# Patient Record
Sex: Male | Born: 1940 | Race: White | Hispanic: No | Marital: Married | Smoking: Former smoker
Health system: Southern US, Community
[De-identification: ages and names within clinical notes are randomized; demographics above are authoritative.]

## PROBLEM LIST (undated history)

## (undated) DIAGNOSIS — C3490 Malignant neoplasm of unspecified part of unspecified bronchus or lung: Secondary | ICD-10-CM

## (undated) DIAGNOSIS — Z9889 Other specified postprocedural states: Secondary | ICD-10-CM

## (undated) DIAGNOSIS — Z8679 Personal history of other diseases of the circulatory system: Secondary | ICD-10-CM

## (undated) DIAGNOSIS — E785 Hyperlipidemia, unspecified: Secondary | ICD-10-CM

## (undated) DIAGNOSIS — N471 Phimosis: Secondary | ICD-10-CM

## (undated) DIAGNOSIS — N179 Acute kidney failure, unspecified: Secondary | ICD-10-CM

## (undated) DIAGNOSIS — Z923 Personal history of irradiation: Secondary | ICD-10-CM

## (undated) DIAGNOSIS — I739 Peripheral vascular disease, unspecified: Secondary | ICD-10-CM

## (undated) DIAGNOSIS — I10 Essential (primary) hypertension: Secondary | ICD-10-CM

## (undated) DIAGNOSIS — H269 Unspecified cataract: Secondary | ICD-10-CM

## (undated) DIAGNOSIS — R351 Nocturia: Secondary | ICD-10-CM

## (undated) DIAGNOSIS — I2510 Atherosclerotic heart disease of native coronary artery without angina pectoris: Secondary | ICD-10-CM

## (undated) DIAGNOSIS — Z95820 Peripheral vascular angioplasty status with implants and grafts: Secondary | ICD-10-CM

## (undated) DIAGNOSIS — C61 Malignant neoplasm of prostate: Secondary | ICD-10-CM

## (undated) HISTORY — DX: Personal history of irradiation: Z92.3

## (undated) HISTORY — PX: BACK SURGERY: SHX140

## (undated) HISTORY — DX: Malignant neoplasm of unspecified part of unspecified bronchus or lung: C34.90

## (undated) HISTORY — PX: POLYPECTOMY: SHX149

## (undated) HISTORY — PX: COLONOSCOPY: SHX174

---

## 1994-06-27 HISTORY — PX: CORONARY ANGIOPLASTY WITH STENT PLACEMENT: SHX49

## 1998-05-20 ENCOUNTER — Emergency Department (HOSPITAL_COMMUNITY): Admission: EM | Admit: 1998-05-20 | Discharge: 1998-05-20 | Payer: Self-pay | Admitting: Emergency Medicine

## 1999-12-21 ENCOUNTER — Inpatient Hospital Stay (HOSPITAL_COMMUNITY): Admission: EM | Admit: 1999-12-21 | Discharge: 1999-12-22 | Payer: Self-pay | Admitting: Emergency Medicine

## 1999-12-21 ENCOUNTER — Encounter: Payer: Self-pay | Admitting: General Surgery

## 1999-12-21 HISTORY — PX: OTHER SURGICAL HISTORY: SHX169

## 2000-01-24 ENCOUNTER — Encounter: Payer: Self-pay | Admitting: Family Medicine

## 2000-01-24 ENCOUNTER — Encounter: Admission: RE | Admit: 2000-01-24 | Discharge: 2000-01-24 | Payer: Self-pay | Admitting: Family Medicine

## 2000-04-23 DIAGNOSIS — Z8679 Personal history of other diseases of the circulatory system: Secondary | ICD-10-CM

## 2000-04-23 HISTORY — DX: Personal history of other diseases of the circulatory system: Z86.79

## 2000-07-17 ENCOUNTER — Encounter: Payer: Self-pay | Admitting: Vascular Surgery

## 2000-07-17 ENCOUNTER — Encounter: Admission: RE | Admit: 2000-07-17 | Discharge: 2000-07-17 | Payer: Self-pay | Admitting: Vascular Surgery

## 2000-08-27 ENCOUNTER — Encounter: Payer: Self-pay | Admitting: Vascular Surgery

## 2000-08-29 ENCOUNTER — Ambulatory Visit (HOSPITAL_COMMUNITY): Admission: RE | Admit: 2000-08-29 | Discharge: 2000-08-29 | Payer: Self-pay | Admitting: Vascular Surgery

## 2000-09-17 ENCOUNTER — Inpatient Hospital Stay (HOSPITAL_COMMUNITY): Admission: RE | Admit: 2000-09-17 | Discharge: 2000-09-21 | Payer: Self-pay | Admitting: Vascular Surgery

## 2000-09-17 ENCOUNTER — Encounter (INDEPENDENT_AMBULATORY_CARE_PROVIDER_SITE_OTHER): Payer: Self-pay | Admitting: Specialist

## 2000-09-17 ENCOUNTER — Encounter: Payer: Self-pay | Admitting: Vascular Surgery

## 2000-09-17 HISTORY — PX: OTHER SURGICAL HISTORY: SHX169

## 2002-05-20 ENCOUNTER — Encounter: Payer: Self-pay | Admitting: Urology

## 2002-05-25 ENCOUNTER — Inpatient Hospital Stay (HOSPITAL_COMMUNITY): Admission: RE | Admit: 2002-05-25 | Discharge: 2002-05-28 | Payer: Self-pay | Admitting: Urology

## 2002-05-25 DIAGNOSIS — C61 Malignant neoplasm of prostate: Secondary | ICD-10-CM

## 2002-05-25 HISTORY — DX: Malignant neoplasm of prostate: C61

## 2002-05-25 HISTORY — PX: PROSTATECTOMY: SHX69

## 2002-05-26 ENCOUNTER — Encounter (INDEPENDENT_AMBULATORY_CARE_PROVIDER_SITE_OTHER): Payer: Self-pay

## 2003-07-12 ENCOUNTER — Ambulatory Visit: Admission: RE | Admit: 2003-07-12 | Discharge: 2003-08-10 | Payer: Self-pay | Admitting: Radiation Oncology

## 2003-08-12 ENCOUNTER — Encounter: Admission: RE | Admit: 2003-08-12 | Discharge: 2003-08-12 | Payer: Self-pay | Admitting: Family Medicine

## 2003-09-02 ENCOUNTER — Ambulatory Visit: Admission: RE | Admit: 2003-09-02 | Discharge: 2003-09-02 | Payer: Self-pay | Admitting: Radiation Oncology

## 2003-09-06 ENCOUNTER — Ambulatory Visit: Admission: RE | Admit: 2003-09-06 | Discharge: 2003-11-10 | Payer: Self-pay | Admitting: Radiation Oncology

## 2003-11-11 ENCOUNTER — Encounter: Admission: RE | Admit: 2003-11-11 | Discharge: 2003-11-11 | Payer: Self-pay | Admitting: Family Medicine

## 2003-12-09 ENCOUNTER — Ambulatory Visit: Admission: RE | Admit: 2003-12-09 | Discharge: 2003-12-09 | Payer: Self-pay | Admitting: Radiation Oncology

## 2003-12-10 ENCOUNTER — Encounter: Admission: RE | Admit: 2003-12-10 | Discharge: 2003-12-10 | Payer: Self-pay | Admitting: Neurosurgery

## 2003-12-31 ENCOUNTER — Encounter: Admission: RE | Admit: 2003-12-31 | Discharge: 2003-12-31 | Payer: Self-pay | Admitting: Neurosurgery

## 2004-02-23 ENCOUNTER — Ambulatory Visit (HOSPITAL_COMMUNITY): Admission: RE | Admit: 2004-02-23 | Discharge: 2004-02-23 | Payer: Self-pay | Admitting: Family Medicine

## 2005-11-15 ENCOUNTER — Encounter: Admission: RE | Admit: 2005-11-15 | Discharge: 2005-11-15 | Payer: Self-pay | Admitting: Family Medicine

## 2007-02-03 ENCOUNTER — Ambulatory Visit: Payer: Self-pay | Admitting: Internal Medicine

## 2007-02-18 ENCOUNTER — Encounter: Payer: Self-pay | Admitting: Internal Medicine

## 2007-02-18 ENCOUNTER — Ambulatory Visit: Payer: Self-pay | Admitting: Internal Medicine

## 2009-06-21 HISTORY — PX: NM MYOCAR PERF WALL MOTION: HXRAD629

## 2010-05-13 ENCOUNTER — Encounter: Payer: Self-pay | Admitting: Neurosurgery

## 2010-09-08 NOTE — Op Note (Signed)
[place]. [facility]  Patient:    Cody Moss, Cody Moss                      MRN: [id number] Proc. Date: 09/17/00 Attending:  Di Kindle. Edilia Bo, M.D. CC:         Dr. Renato Battles, M.D.   Operative Report  PREOPERATIVE DIAGNOSIS:  A 5.1 cm infrarenal abdominal aortic aneurysm.  POSTOPERATIVE DIAGNOSIS:  A 5.1 cm infrarenal abdominal aortic aneurysm.  PROCEDURE:  Repair of abdominal aortic aneurysm with aortobi-iliac bypass graft (20 x 10 mm graft).  SURGEON:  Di Kindle. Edilia Bo, M.D.  ASSISTANT:  Adair Patter, P.A.  ANESTHESIA:  General.  INDICATIONS:  This is a 70 year old gentleman who I had originally seen with a 4.6 cm aneurysm in September 2001.  He had a follow-up CT scan in six months, and this shows that the aneurysm had enlarged to 5.1 cm.  After preoperative cardiac evaluation, he was brought in for elective repair.  The procedure and potential complications were discussed with the patient in detail preoperatively.  DESCRIPTION OF PROCEDURE:  The patient was brought to the operating room and received a general anesthetic.  Swan-Ganz catheter and arterial line had been placed by anesthesia.  The abdomen and groins were prepped and draped in the usual sterile fashion.  The abdomen was then entered through a midline incision.  Upon careful exploration, no other anterior abdominal pathology was found besides the aneurysm.  There was just some mild diverticular disease. The transverse colon was then reflected superiorly and the small bowel reflected to the right.  The retroperitoneal tissue was divided and the aneurysm dissected free up to the level of the renal vein.  This was dissected free enough that a clamp could be placed proximally below the renal arteries. Next, the dissection was carried down to the right common iliac artery, where the vessel did have some plaque but was able to be easily clamped.  Likewise, the  left common iliac artery was dissected out, trying to preserve the sympathetics as they crossed the left common iliac artery.  Again a clamp could be applied here also.  Next, the patient was heparinized with 9000 units of IV heparin, and the patient also received 25 g of mannitol.  A clamp was then placed on the infrarenal aorta and then both common iliac arteries were clamped.  The aneurysm was entered, and a large amount of laminated thrombus was removed.  There was some significant bleeding from a middle sacral artery, and there was a significant plaque here, which I had to endarterectomize in order to oversew this middle sacral artery.  Because of this, I did not think I would be able to sew a tube graft, and therefore I decided to go to both common iliac arteries.  Proximally the graft was cut to the appropriate length and using a felt cuff, it was sewn end-to-side to the infrarenal aorta after the proximal aorta was teed off and divided circumferentially.  The anastomosis was tested for hemostasis and was hemostatic.  Each limb of the graft was pulled down for anastomosis to the common iliac arteries.  The right common iliac artery was slightly spatulated.  A graft was cut to the appropriate length and sewn end-to-end to the common iliac artery using continuous 4-0 Prolene suture.  The vessels were backbled and flushed appropriately and then flow re-established to the right leg, which the patient tolerated from a hemodynamic standpoint.  Next, the left common iliac artery, which had been clamped, was opened further and divided circumferentially.  The left limb of the graft was cut to the appropriate length and sewn end-to-end to the left common iliac artery using continuous 4-0 Prolene suture.  At the completion, again the flow was re-established to the left leg, which the patient tolerated from a hemodynamic standpoint.  The wound was irrigated with copious amounts of antibiotic  solution.  The hemostasis was obtained.  The aneurysm was then closed over the graft using a running 2-0 Vicryl suture. The retroperitoneal tissue was closed with a running 2-0 Vicryl suture.  The retroperitoneal tissue was closed with a running 2-0 Vicryl suture.  The abdominal contents were returned to their normal position and then the fascia closed with two running #1 PDS sutures.  The skin was closed with staples.  A sterile dressing was applied.  The patient tolerated the procedure well, was transferred to the recovery room in satisfactory condition.  All needle and sponge counts were correct. DD:  09/17/00 TD:  09/17/00 Job: [id number]
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
## 2010-09-08 NOTE — Procedures (Signed)
[facility]  Patient:    Cody Moss, Cody Moss                    MRN: [id number] Proc. Date: 08/29/00 Adm. Date:  [id number] Attending:  Bennye Alm CC:         [name], M.D.  [facility], [facility]   Procedure Report  PROCEDURES: 1. Aortogram. 2. Bilateral iliac arteriogram. 3. Bilateral lower extremity runoff.  INDICATIONS FOR PROCEDURE:  This is a 70 year old gentleman who I have been following with an infrarenal abdominal aortic aneurysm. This enlarged to greater than 5 cm and therefore elective repair was recommended. His CAT scan showed that the proximal neck of the aneurysm was approximately 28 to 30 mm and also had some laminated thrombus at the neck and therefore he was probably not a candidate for endovascular repair. He was brought in for arteriography in order to plan elective surgical repair and to be sure that in fact he is not a candidate for endovascular repair. The procedure and potential complications were discussed with the patient in the office prior to the procedure. All questions were answered and he elected to proceed.  TECHNIQUE:  The patient was brought to the PV lab at [facility] and studied with a milligram of Versed and 50 mcg of fentanyl. Both groins were prepped and draped in the usual sterile fashion. After the skin was infiltrated with 1% lidocaine, the right common femoral artery was cannulated and a guidewire introduced into the infrarenal aorta. A 5 French sheath was then passed over the wire. Pigtail catheter positioned over the wire and positioned at the L1 vertebral body. The flush aortogram was then obtained and then a lateral projection was obtained. The catheter was repositioned above the aortic bifurcation and oblique iliac projections were obtained and then bilateral lower extremity runoff film obtained using a bolus and chase technique.  FINDINGS:  There were single renal arteries bilaterally  with no significant renal artery stenosis identified. The size of the aneurysm could not be ascertained by the study as there was a significant amount of laminated thrombus which was noted on his CAT scan. There is mild dilatation of the distal left common iliac artery. The right common iliac artery is widely patent. Both hypogastric arteries are patent and both external iliac arteries are patent. On the lateral projection, the celiac axis and the superior mesenteric arteries are widely patent. There appears to be an eccentric plaque at the distal left common iliac artery. The common femoral, superficial femora and deep femoral arteries are patent bilaterally. There is some mild narrowing of the distal superficial femoral artery on the left. The popliteal arteries are widely patent and there is three-vessel runoff bilaterally via the anterior tibial, posterior tibial and peroneal arteries.  CONCLUSIONS: 1. Infrarenal abdominal aortic aneurysm the size of which cannot be accurately    determined by the study because of laminated thrombus. 2. Mild dilatation of the left distal common iliac artery. No significant    infrainguinal arterial occlusive disease. DD:  08/29/00 TD:  08/29/00 Job: [id number]

## 2010-09-08 NOTE — Discharge Summary (Signed)
NAME:  Cody Moss, Cody Moss NO.:  [id number]   MEDICAL RECORD NO.:  [id number]                   PATIENT TYPE:  INP   LOCATION:  7829                                 FACILITY:  [facility]   PHYSICIAN:  Crecencio Mc, M.D.                    DATE OF BIRTH:  01-18-1941   DATE OF ADMISSION:  05/25/2002  DATE OF DISCHARGE:  05/28/2002                                 DISCHARGE SUMMARY   ADMISSION DIAGNOSIS:  Prostate cancer.   DISCHARGE DIAGNOSIS:  Prostate cancer.   PROCEDURES:  1. Radical retropubic prostatectomy.  2. Bilateral pelvic lymphadenectomy.   HISTORY OF PRESENT ILLNESS:  For full details, please see admission history  and physical.  Briefly, the patient is a 70 year old white male who was  recently found on his left side a left-sided prostate nodule.  He was also  found to have an elevated PSA of 7.9.  He subsequently underwent a prostate  biopsy and was found to have Gleason 4+4=8 adenocarcinoma of the prostate.  After discussing treatment options, the patient elected to proceed with  radical retropubic prostatectomy.   HOSPITAL COURSE:  The patient was taken to the operating room on 05/25/02, and  underwent the above procedures.  He tolerated these well, and  postoperatively was able to be transferred to a regular hospital room  following recovery from anesthesia.  Over the course of the next few days,  the patient was gradually able to resume ambulation, and his diet was  gradually advanced until he was able to tolerate a regular diet.  On the  evening of postoperative day #1, the patient did have a fever to 101.7  degrees Fahrenheit.  He was encouraged to be ambulatory and to use incentive  spirometry.  On postoperative day #2, his Harrison Mons drain had decreased output  and was able to be removed.  By postoperative day #3, the patient had  defervesced and was able to be discharged home with a Foley catheter.   DISPOSITION:  Home.   DISCHARGE  MEDICATIONS:  1. The patient was instructed to resume all regular medications.  2. In addition, he was given a prescription for hydrocodone, as well as     Bactrim.   DIET:  The patient was instructed to resume his diet as before surgery.   ACTIVITY:  He is instructed to be ambulatory, however, he was specifically  instructed to refrain from any heavy lifting, strenuous activity, or  driving.   WOUND CARE:  He was instructed on Foley catheter care and given a leg bag  for daytime use.    FOLLOWUP:  A follow up appointment was made for the patient to follow up  with Dr. Retta Diones in approximately one week for staple removal.  Crecencio Mc, M.D.    LB/MEDQ  D:  07/01/2002  T:  07/02/2002  Job:  [id number]
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
## 2010-09-08 NOTE — Op Note (Signed)
NAME:  Cody Moss, Cody Moss NO.:  [id number]   MEDICAL RECORD NO.:  [id number]                   PATIENT TYPE:  INP   LOCATION:  X005                                 FACILITY:  [facility]   PHYSICIAN:  Bertram Millard. Dahlstedt, M.D.          DATE OF BIRTH:  1941-01-11   DATE OF PROCEDURE:  05/25/2002  DATE OF DISCHARGE:                                 OPERATIVE REPORT   PREOPERATIVE DIAGNOSIS:  Adenocarcinoma of the prostate.   POSTOPERATIVE DIAGNOSIS:  Adenocarcinoma of the prostate.   PROCEDURES:  1. Bilateral pelvic lymphadenectomy.  2. Radical retropubic prostatectomy.   SURGEON:  Bertram Millard. Retta Diones, M.D.   ASSISTANT:  Crecencio Mc, M.D.   ANESTHESIA:  General.   ESTIMATED BLOOD LOSS:  1200 mL.   FLUIDS REPLACED:  4200 mL crystalloid.   SPECIMENS:  1. Bilateral obturator lymph nodes.  2. Prostate and seminal vesicles.   COMPLICATIONS:  None.   DRAINS:  1. Foley catheter.  2. #10 Blake drain.   INDICATIONS:  The patient is a 70 year old white male who was recently found  to have a left-sided prostatic nodule along with an elevated PSA of 7.9.  He  subsequently underwent a prostate biopsy and was found to have Gleason 4 x 4  = 8 adenocarcinoma of the prostate.  After discussing treatment options, the  patient elected to proceed with surgical therapy.  Potential risks and  benefits of radical prostatectomy were discussed with the patient, and he  consented.   DESCRIPTION OF PROCEDURE:  The patient was taken to the operating room and a  general anesthetic was administered.  The patient was placed in the supine  position, prepped and draped in the usual sterile fashion, and administered  preoperative antibiotics.  The table was slightly flexed at the iliac  crests.  A 20 French Foley catheter was then inserted into the bladder and  the bladder was drained.  An infraumbilical incision was then made with a  #10 blade from a site a few centimeters  below the umbilicus down through the  pubic symphysis.  This was then carried down through the subcutaneous  tissues with Bovie electrocautery.  The rectus fascia was then incised in  the midline and the rectus muscles were divided.  The transversalis fascia  was then sharply entered, thereby exposing the space of Retzius.  This space  was then bluntly developed on either side of the bladder to expose the  obturator lymph node packages.  A self-retaining Bookwalter retractor was  then placed and attention turned to lymphadenectomy.  The left side was  first examined, and there were no abnormal palpable lymph nodes on gross  examination.  An obturator lymph node dissection was then performed with  blunt and sharp dissection used as necessary, with care not to injure the  obturator nerve.  The vessels and lymphatics were hemoclipped.  The  boundaries of the lymphadenectomy included the iliac vein  superiorly, the  bifurcation of the iliac vessels, the external iliac vein anteriorly, the  bifurcation of the iliac vessels superiorly, the obturator nerve  posteriorly.  Attention then turned to the right side and an obturator lymph  node dissection was performed in a similar fashion.  These specimens were  then sent for permanent pathologic examination.  Attention then turned to  the prostatectomy.  The endopelvic fascia was incised on either side of the  prostate and the levator fibers were bluntly freed from the prostate  laterally.  The puboprostatic ligaments were then identified and sharply  dissected from the pubis.  Attention then turned to the dorsal venous  complex.  A Hohenfellner was placed between the dorsal venous complex and  the anterior urethra.  A #1 Vicryl tie was then used to tie off the dorsal  vein.  A second #1 Vicryl was then also placed around the dorsal vein.  A 2-  0 Vicryl was then also placed through this space and then taken through the  middle of the dorsal venous  complex with a suture ligature.  A 2-0 Vicryl  back bleeding stitch was also placed.  The dorsal venous complex was then  taken down with electrocautery down until the anterior urethra was exposed.  The neurovascular bundle on the right side was identified and reflected  laterally and posteriorly away from the urethra with the aid of a right  angle.  The right angle was then passed under the urethra and a piece of  umbilical tape was drawn through this space.  The anterior urethra was then  divided with Metzenbaum scissors and the Foley catheter was brought into the  field.  The posterior urethra was then also sharply dissected free.  The  space between the anterior rectal fascia and Denonvillier's fascia was then  bluntly developed.  The prostatic pedicles were then dissected out with the  aid of a right angle, and these were hemoclipped and divided.  A nerve-  sparing approach was performed on the right side while a wide excision was  performed on the left side of the prostate.  Attention then turned to the  area of the bladder neck.  A Vanderbilt was used to dissect the muscular  fibers overlying the bladder neck.  These were divided with electrocautery.  Once the bladder neck was easily identified, it was entered anteriorly.  The  posterior bladder neck was then divided with electrocautery and the space  between the prostate and the posterior bladder was developed.  The remaining  pedicle tissue was then hemoclipped and divided.  This left only the seminal  vesicles and the ampulla of the vas deferens.  These structures were  dissected out with the aid of a right angle, hemoclipped, and sharply  divided.  The specimen was then removed from the wound and passed off for  permanent pathologic examination.  Attention was then turned to obtaining  hemostasis.  There did appear to be some oozing from the right side of the bladder neck area.  This area was cauterized until good hemostasis was   achieved.  The dorsal venous complex was examined and appeared to have good  hemostasis.  Attention then turned to reconstruction of the bladder neck.  The posterior bladder neck was reconstructed in a tennis racquet fashion  with a 2-0 chromic suture.  The remainder of the bladder neck was then  everted with interrupted 4-0 chromic sutures, leaving the bladder neck of a  size to  allow passage of the catheter.  A Greenwald sound was then placed  into the urethra to help identify the urethral stump.  Five separate 2-0 PDS  anastomotic sutures were then placed in the bladder neck at the 5 o'clock, 7  o'clock, 9 o'clock, 3 o'clock, and 12 o'clock positions.  At this point  there was noted to be some new venous bleeding from the dorsal venous  complex.  This was able to be grasped with the right angle and a 2-0 Vicryl  tie was used to control this bleeding.  The anastomotic sutures were then  brought through the bladder neck in their corresponding positions and the  first suture on the posterior left side was tied down.  However, this suture  did pull through.  Therefore, a free needle was placed on one edge of this  suture, and it was able to be brought back through the urethra in the  correct position.  It was then tied down and appeared to allow good  approximation of the bladder neck and urethra.  The other sutures were then  tied down without difficulty.  The bladder was then irrigated and there  appeared to be no significant anastomotic leak or blood clots in the  bladder.  A #10 Blake drain was then placed in the pelvis and brought out  through a separate stab incision in the right-sided abdominal wall.  The  fascia was then fastened to the abdominal skin with a nylon stitch.  The  fascia was then closed with a running #1 PDS suture.  The skin was then  reapproximated with a running 4-0 Vicryl subcuticular stitch.  Benzoin and  Steri-Strips were placed, and  a sterile dressing was  applied.  The patient  appeared to tolerate the  procedure well and without complications.  He was able to be extubated  nondistended transferred to the recovery unit in satisfactory condition.  Please note that Dr. Marcine Matar was the operating surgeon and was  present and participated in this entire procedure.      Crecencio Mc, M.D.                          Bertram Millard. Retta Diones, M.D.    Arlana Hove  D:  05/25/2002  T:  05/25/2002  Job:  [id number]
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
## 2011-05-03 ENCOUNTER — Other Ambulatory Visit: Payer: Self-pay | Admitting: Urology

## 2011-05-03 DIAGNOSIS — C61 Malignant neoplasm of prostate: Secondary | ICD-10-CM

## 2011-05-14 ENCOUNTER — Encounter (HOSPITAL_COMMUNITY)
Admission: RE | Admit: 2011-05-14 | Discharge: 2011-05-14 | Disposition: A | Discharge status: Home / Self Care | Payer: Medicare Other | Source: Ambulatory Visit | Attending: Urology | Admitting: Urology

## 2011-05-14 ENCOUNTER — Encounter (HOSPITAL_COMMUNITY): Payer: Self-pay

## 2011-05-14 ENCOUNTER — Other Ambulatory Visit: Payer: Self-pay | Admitting: Urology

## 2011-05-14 DIAGNOSIS — C61 Malignant neoplasm of prostate: Secondary | ICD-10-CM

## 2011-05-14 DIAGNOSIS — R972 Elevated prostate specific antigen [PSA]: Secondary | ICD-10-CM | POA: Insufficient documentation

## 2011-05-14 MED ORDER — TECHNETIUM TC 99M MEDRONATE IV KIT
25.0000 | PACK | Freq: Once | INTRAVENOUS | Status: AC | PRN
  Administered 2011-05-14: 25 via INTRAVENOUS

## 2011-06-04 ENCOUNTER — Encounter (HOSPITAL_BASED_OUTPATIENT_CLINIC_OR_DEPARTMENT_OTHER): Payer: Self-pay | Admitting: *Deleted

## 2011-06-04 NOTE — Progress Notes (Signed)
NPO AFTER MN. ARRIVES AT 1015. NEEDS ISTAT. EKG, NOTE, STRESS TEST TO BE FAXED FROM DR AL LITTLE. WILL TAKE TOPROL AM OF SURG W/ SIP OF WATER.

## 2011-06-08 ENCOUNTER — Encounter (HOSPITAL_BASED_OUTPATIENT_CLINIC_OR_DEPARTMENT_OTHER)
Admission: RE | Disposition: A | Discharge status: Home / Self Care | Payer: Self-pay | Source: Ambulatory Visit | Attending: Urology

## 2011-06-08 ENCOUNTER — Encounter (HOSPITAL_BASED_OUTPATIENT_CLINIC_OR_DEPARTMENT_OTHER): Payer: Self-pay | Admitting: Anesthesiology

## 2011-06-08 ENCOUNTER — Ambulatory Visit (HOSPITAL_BASED_OUTPATIENT_CLINIC_OR_DEPARTMENT_OTHER): Payer: Medicare Other | Admitting: Anesthesiology

## 2011-06-08 ENCOUNTER — Other Ambulatory Visit: Payer: Self-pay | Admitting: Urology

## 2011-06-08 ENCOUNTER — Encounter (HOSPITAL_BASED_OUTPATIENT_CLINIC_OR_DEPARTMENT_OTHER): Payer: Self-pay | Admitting: *Deleted

## 2011-06-08 ENCOUNTER — Ambulatory Visit (HOSPITAL_BASED_OUTPATIENT_CLINIC_OR_DEPARTMENT_OTHER)
Admission: RE | Admit: 2011-06-08 | Discharge: 2011-06-08 | Disposition: A | Discharge status: Home / Self Care | Payer: Medicare Other | Source: Ambulatory Visit | Attending: Urology | Admitting: Urology

## 2011-06-08 DIAGNOSIS — Z87898 Personal history of other specified conditions: Secondary | ICD-10-CM

## 2011-06-08 DIAGNOSIS — N529 Male erectile dysfunction, unspecified: Secondary | ICD-10-CM | POA: Insufficient documentation

## 2011-06-08 DIAGNOSIS — I739 Peripheral vascular disease, unspecified: Secondary | ICD-10-CM | POA: Insufficient documentation

## 2011-06-08 DIAGNOSIS — E785 Hyperlipidemia, unspecified: Secondary | ICD-10-CM | POA: Insufficient documentation

## 2011-06-08 DIAGNOSIS — E119 Type 2 diabetes mellitus without complications: Secondary | ICD-10-CM | POA: Insufficient documentation

## 2011-06-08 DIAGNOSIS — N478 Other disorders of prepuce: Secondary | ICD-10-CM | POA: Insufficient documentation

## 2011-06-08 DIAGNOSIS — C61 Malignant neoplasm of prostate: Secondary | ICD-10-CM | POA: Insufficient documentation

## 2011-06-08 DIAGNOSIS — Z9861 Coronary angioplasty status: Secondary | ICD-10-CM | POA: Insufficient documentation

## 2011-06-08 DIAGNOSIS — I2510 Atherosclerotic heart disease of native coronary artery without angina pectoris: Secondary | ICD-10-CM | POA: Insufficient documentation

## 2011-06-08 DIAGNOSIS — I10 Essential (primary) hypertension: Secondary | ICD-10-CM | POA: Insufficient documentation

## 2011-06-08 DIAGNOSIS — N471 Phimosis: Secondary | ICD-10-CM | POA: Insufficient documentation

## 2011-06-08 DIAGNOSIS — Z9079 Acquired absence of other genital organ(s): Secondary | ICD-10-CM | POA: Insufficient documentation

## 2011-06-08 HISTORY — DX: Unspecified cataract: H26.9

## 2011-06-08 HISTORY — DX: Phimosis: N47.1

## 2011-06-08 HISTORY — DX: Nocturia: R35.1

## 2011-06-08 HISTORY — DX: Hyperlipidemia, unspecified: E78.5

## 2011-06-08 HISTORY — DX: Personal history of other diseases of the circulatory system: Z86.79

## 2011-06-08 HISTORY — DX: Essential (primary) hypertension: I10

## 2011-06-08 HISTORY — DX: Malignant neoplasm of prostate: C61

## 2011-06-08 HISTORY — DX: Other specified postprocedural states: Z98.890

## 2011-06-08 HISTORY — DX: Peripheral vascular angioplasty status with implants and grafts: Z95.820

## 2011-06-08 HISTORY — DX: Atherosclerotic heart disease of native coronary artery without angina pectoris: I25.10

## 2011-06-08 HISTORY — DX: Peripheral vascular disease, unspecified: I73.9

## 2011-06-08 HISTORY — PX: CIRCUMCISION: SHX1350

## 2011-06-08 SURGERY — CIRCUMCISION, ADULT
Anesthesia: General | Site: Penis | Wound class: Clean Contaminated

## 2011-06-08 MED ORDER — FENTANYL CITRATE 0.05 MG/ML IJ SOLN
INTRAMUSCULAR | Status: DC | PRN
  Administered 2011-06-08: 50 ug via INTRAVENOUS
  Administered 2011-06-08 (×2): 25 ug via INTRAVENOUS

## 2011-06-08 MED ORDER — LACTATED RINGERS IV SOLN
INTRAVENOUS | Status: DC
  Administered 2011-06-08 (×2): via INTRAVENOUS

## 2011-06-08 MED ORDER — MEPERIDINE HCL 25 MG/ML IJ SOLN
6.2500 mg | INTRAMUSCULAR | Status: DC | PRN
Start: 2011-06-08 — End: 2011-06-08

## 2011-06-08 MED ORDER — ONDANSETRON HCL 4 MG/2ML IJ SOLN
INTRAMUSCULAR | Status: DC | PRN
  Administered 2011-06-08: 4 mg via INTRAVENOUS

## 2011-06-08 MED ORDER — FENTANYL CITRATE 0.05 MG/ML IJ SOLN: 25.0000 ug | INTRAMUSCULAR | Status: DC | PRN

## 2011-06-08 MED ORDER — LACTATED RINGERS IV SOLN
INTRAVENOUS | Status: DC | PRN
  Administered 2011-06-08: 11:00:00 via INTRAVENOUS

## 2011-06-08 MED ORDER — PROMETHAZINE HCL 25 MG/ML IJ SOLN: 6.2500 mg | INTRAMUSCULAR | Status: DC | PRN

## 2011-06-08 MED ORDER — MIDAZOLAM HCL 5 MG/5ML IJ SOLN
INTRAMUSCULAR | Status: DC | PRN
  Administered 2011-06-08: 2 mg via INTRAVENOUS
  Administered 2011-06-08 (×2): 1 mg via INTRAVENOUS

## 2011-06-08 MED ORDER — LACTATED RINGERS IV SOLN: INTRAVENOUS | Status: DC

## 2011-06-08 MED ORDER — CIPROFLOXACIN IN D5W 400 MG/200ML IV SOLN
400.0000 mg | INTRAVENOUS | Status: AC
  Administered 2011-06-08: 400 mg via INTRAVENOUS

## 2011-06-08 MED ORDER — BUPIVACAINE HCL 0.25 % IJ SOLN
INTRAMUSCULAR | Status: DC | PRN
  Administered 2011-06-08: 24 mL

## 2011-06-08 MED ORDER — PROPOFOL 10 MG/ML IV EMUL
INTRAVENOUS | Status: DC | PRN
  Administered 2011-06-08: 50 ug/kg/min via INTRAVENOUS

## 2011-06-08 MED ORDER — HYDROCODONE-ACETAMINOPHEN 5-500 MG PO CAPS: 1.0000 | ORAL_CAPSULE | ORAL | Status: AC | PRN

## 2011-06-08 SURGICAL SUPPLY — 33 items
BANDAGE CO FLEX L/F 1IN X 5YD (GAUZE/BANDAGES/DRESSINGS) ×2 IMPLANT
BANDAGE CONFORM 2  STR LF (GAUZE/BANDAGES/DRESSINGS) ×2 IMPLANT
BLADE SURG 15 STRL LF DISP TIS (BLADE) ×1 IMPLANT
BLADE SURG 15 STRL SS (BLADE) ×2
BNDG COHESIVE 1X5 TAN STRL LF (GAUZE/BANDAGES/DRESSINGS) ×1 IMPLANT
CLOTH BEACON ORANGE TIMEOUT ST (SAFETY) ×2 IMPLANT
COVER MAYO STAND STRL (DRAPES) ×2 IMPLANT
COVER TABLE BACK 60X90 (DRAPES) ×2 IMPLANT
DRAPE PED LAPAROTOMY (DRAPES) ×2 IMPLANT
ELECT NDL TIP 2.8 STRL (NEEDLE) IMPLANT
ELECT NEEDLE TIP 2.8 STRL (NEEDLE) ×2 IMPLANT
ELECT REM PT RETURN 9FT ADLT (ELECTROSURGICAL) ×2
ELECTRODE REM PT RTRN 9FT ADLT (ELECTROSURGICAL) ×1 IMPLANT
GAUZE SPONGE 4X4 8PLY STR LF (GAUZE/BANDAGES/DRESSINGS) ×1 IMPLANT
GAUZE VASELINE 1X8 (GAUZE/BANDAGES/DRESSINGS) ×1 IMPLANT
GAUZE VASELINE 3X9 (GAUZE/BANDAGES/DRESSINGS) ×1 IMPLANT
GLOVE BIO SURGEON STRL SZ8 (GLOVE) ×3 IMPLANT
GLOVE ECLIPSE 6.5 STRL STRAW (GLOVE) ×1 IMPLANT
GLOVE ECLIPSE 7.5 STRL STRAW (GLOVE) ×1 IMPLANT
GOWN STRL REIN XL XLG (GOWN DISPOSABLE) ×2 IMPLANT
GOWN W/COTTON TOWEL STD LRG (GOWNS) ×3 IMPLANT
GOWN XL W/COTTON TOWEL STD (GOWNS) ×2 IMPLANT
NDL HYPO 25X5/8 SAFETYGLIDE (NEEDLE) ×1 IMPLANT
NEEDLE HYPO 22GX1.5 SAFETY (NEEDLE) ×2 IMPLANT
NEEDLE HYPO 25X5/8 SAFETYGLIDE (NEEDLE) ×2 IMPLANT
NS IRRIG 500ML POUR BTL (IV SOLUTION) ×2 IMPLANT
PACK BASIN DAY SURGERY FS (CUSTOM PROCEDURE TRAY) ×2 IMPLANT
PENCIL BUTTON HOLSTER BLD 10FT (ELECTRODE) ×2 IMPLANT
SUT CHROMIC 4 0 RB 1X27 (SUTURE) ×4 IMPLANT
SUT CHROMIC 5 0 RB 1 27 (SUTURE) IMPLANT
SYR CONTROL 10ML LL (SYRINGE) ×2 IMPLANT
TOWEL OR 17X24 6PK STRL BLUE (TOWEL DISPOSABLE) ×4 IMPLANT
WATER STERILE IRR 500ML POUR (IV SOLUTION) ×2 IMPLANT

## 2011-06-08 NOTE — Anesthesia Preprocedure Evaluation (Addendum)
Anesthesia Evaluation  Patient identified by MRN, date of birth, ID band Patient awake    Reviewed: Allergy & Precautions, H&P , NPO status , Patient's Chart, lab work & pertinent test results, reviewed documented beta blocker date and time   Airway Mallampati: II TM Distance: >3 FB Neck ROM: Full    Dental No notable dental hx.    Pulmonary neg pulmonary ROS,  clear to auscultation  Pulmonary exam normal       Cardiovascular hypertension, Pt. on medications - angina+ CAD (s/p stent '96. neg stress test 2011) and neg cardio ROS Regular Normal    Neuro/Psych Negative Neurological ROS  Negative Psych ROS   GI/Hepatic negative GI ROS, Neg liver ROS,   Endo/Other  Negative Endocrine ROSDiabetes mellitus-, Oral Hypoglycemic Agents  Renal/GU negative Renal ROS  Genitourinary negative   Musculoskeletal negative musculoskeletal ROS (+)   Abdominal   Peds negative pediatric ROS (+)  Hematology negative hematology ROS (+)   Anesthesia Other Findings   Reproductive/Obstetrics negative OB ROS                          Anesthesia Physical Anesthesia Plan  ASA: II  Anesthesia Plan: General   Post-op Pain Management:    Induction: Intravenous  Airway Management Planned:   Additional Equipment:   Intra-op Plan:   Post-operative Plan: Extubation in OR  Informed Consent: I have reviewed the patients History and Physical, chart, labs and discussed the procedure including the risks, benefits and alternatives for the proposed anesthesia with the patient or authorized representative who has indicated his/her understanding and acceptance.   Dental advisory given  Plan Discussed with: CRNA  Anesthesia Plan Comments:         Anesthesia Quick Evaluation

## 2011-06-08 NOTE — Anesthesia Postprocedure Evaluation (Signed)
  Anesthesia Post-op Note  Patient: Cody Moss  Procedure(s) Performed: Procedure(s) (LRB): CIRCUMCISION ADULT (N/A)  Patient Location: PACU  Anesthesia Type: MAC  Level of Consciousness: awake and alert   Airway and Oxygen Therapy: Patient Spontanous Breathing  Post-op Pain: mild  Post-op Assessment: Post-op Vital signs reviewed, Patient's Cardiovascular Status Stable, Respiratory Function Stable, Patent Airway and No signs of Nausea or vomiting  Post-op Vital Signs: stable  Complications: No apparent anesthesia complications

## 2011-06-08 NOTE — Transfer of Care (Signed)
Immediate Anesthesia Transfer of Care Note  Patient: Cody Moss  Procedure(s) Performed: Procedure(s) (LRB): CIRCUMCISION ADULT (N/A)  Patient Location: PACU  Anesthesia Type: General  Level of Consciousness: awake, sedated, patient cooperative and responds to stimulation  Airway & Oxygen Therapy: Patient Spontanous Breathing and Patient connected to face mask oxygen  Post-op Assessment: Report given to PACU RN, Post -op Vital signs reviewed and stable and Patient moving all extremities  Post vital signs: Reviewed and stable  Complications: No apparent anesthesia complications

## 2011-06-08 NOTE — Anesthesia Procedure Notes (Signed)
Procedure Name: MAC Date/Time: 06/08/2011 11:47 AM Performed by: Iline Oven Pre-anesthesia Checklist: Patient identified, Emergency Drugs available, Suction available, Patient being monitored and Timeout performed Patient Re-evaluated:Patient Re-evaluated prior to inductionPreoxygenation: Pre-oxygenation with 100% oxygen
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
## 2011-06-08 NOTE — Discharge Instructions (Addendum)
herPostoperative instructions for circumcision  Wound:  Remove the dressing the morning after surgery. In most cases your incision will have absorbable sutures that run along the course of your incision and will dissolve within the first 10-20 days. Some will fall out even earlier. Expect some redness as the sutures dissolved but this should occur only around the sutures. If there is generalized redness, especially with increasing pain or swelling, let us know. The penis will possibly get "black and blue" as the blood in the tissues spread. Sometimes the whole penis will turn colors. The black and blue is followed by a yellow and brown color. In time, all the discoloration will go away.  Diet:  You may return to your normal diet within 24 hours following your surgery. You may note some mild nausea and possibly vomiting the first 6-8 hours following surgery. This is usually due to the side effects of anesthesia, and will disappear quite soon. I would suggest clear liquids and a very light meal the first evening following your surgery.  Activity:  Your physical activity should be restricted the first 48 hours. During that time you should remain relatively inactive, moving about only when necessary. During the first 7-10 days following surgery he should avoid lifting any heavy objects (anything greater than 15 pounds), and avoid strenuous exercise. If you work, ask [place] specifically about your restrictions, both for work and home. We will write a note to your employer if needed.  Ice packs can be placed on and off over the penis for the first 48 hours to help relieve the pain and keep the swelling down. Frozen peas or corn in a ZipLock bag can be frozen, used and re-frozen. Fifteen minutes on and 15 minutes off is a reasonable schedule.  No sexual activity for 1 month.  Hygiene:  You may shower 24 hours after your surgery. Make sure wound is clean and dry afterwards. Tub bathing should be restricted  until the seventh day.  Medication:  You will be sent home with some type of pain medication. In many cases you will be sent home with a narcotic pain pill (Vicodin or Tylox). If the pain is not too bad, you may take either Tylenol (acetaminophen) or Advil (ibuprofen) which contain no narcotic agents, and might be tolerated a little better, with fewer side effects. If the pain medication you are sent home with does not control the pain, you will have to let us know. Some narcotic pain medications cannot be given or refilled by a phone call to a pharmacy.  Problems you should report to [place]:   Fever of 101.0 degrees Fahrenheit or greater.  Moderate or severe swelling under the skin incision or involving the scrotum.  Drug reaction such as hives, a rash, nausea or vomiting.   Difficulty voiding

## 2011-06-08 NOTE — Op Note (Signed)
Preoperative diagnosis: Phimosis Postoperative diagnosis: Same  Procedure: Circumcision Surgeon: Bertram Millard. Jessicia Napolitano, MD. Anesthesia: MAC with penile block Specimen: foreskin for gross only Complications: none [id number]  Indications: The patient was recently evaluated for phimosis. All risks and benefits of circumcision discussed. Full informed consent obtained. The patient now presents for definitive procedure.  Technique and findings: The patient was brought to the operating room. Successful induction of MAC anesthesia.  The patient was then prepped and draped in usual manner. Appropriate surgical timeout was performed. A penile block was then performed with 24cc of quarter percent plain Marcaine. Proximal and distal incision sites were marked with a pen, and appropriate circumferential incisions were created. The sleeve of redundant skin was removed with the bovie. Hemostatis was achieved using electrocautery.Quadrant sutures were placed with interrupted 4-0 chromic, with the phrenular suture being a "U" stitch. In between, the same 4-0 chromic was used to re approximate the skin edges with a running simple stitch.  The incision was wrapped with Xeroform gauze, a plain guaze wrap and Coban dressing. The patient was brought to recovery room in stable condition having had no obvious complications or problems. Sponge and needle counts were correct.

## 2011-06-12 ENCOUNTER — Encounter (HOSPITAL_BASED_OUTPATIENT_CLINIC_OR_DEPARTMENT_OTHER): Payer: Self-pay | Admitting: Urology

## 2011-10-02 ENCOUNTER — Encounter: Payer: Self-pay | Admitting: Radiation Oncology

## 2011-10-02 DIAGNOSIS — C61 Malignant neoplasm of prostate: Secondary | ICD-10-CM | POA: Insufficient documentation

## 2011-10-02 DIAGNOSIS — Z923 Personal history of irradiation: Secondary | ICD-10-CM | POA: Insufficient documentation

## 2011-10-03 ENCOUNTER — Encounter: Payer: Self-pay | Admitting: Radiation Oncology

## 2011-10-04 ENCOUNTER — Ambulatory Visit
Admission: RE | Admit: 2011-10-04 | Discharge: 2011-10-04 | Disposition: A | Discharge status: Home / Self Care | Payer: Medicare Other | Source: Ambulatory Visit | Attending: Radiation Oncology | Admitting: Radiation Oncology

## 2011-10-04 ENCOUNTER — Encounter: Payer: Self-pay | Admitting: Radiation Oncology

## 2011-10-04 VITALS — BP 147/81 | HR 70 | Temp 97.0°F | Resp 20 | Ht 70.5 in | Wt 208.2 lb

## 2011-10-04 DIAGNOSIS — C61 Malignant neoplasm of prostate: Secondary | ICD-10-CM | POA: Insufficient documentation

## 2011-10-04 DIAGNOSIS — Z510 Encounter for antineoplastic radiation therapy: Secondary | ICD-10-CM | POA: Insufficient documentation

## 2011-10-04 NOTE — Progress Notes (Signed)
Please see the Nurse Progress Note in the MD Initial Consult Encounter for this patient. 

## 2011-10-04 NOTE — Progress Notes (Signed)
Followup note:  Diagnosis: PSA recurrent adenocarcinoma prostate  Requesting physician: Dr. Marcine Matar  History Mr. Burston is a pleasant 71 year old male who is seen today to request of Dr. Retta Diones for consideration of prophylactic breast irradiation to prevent painful gynecomastia in anticipation of use of bicalutamide. I first saw the patient in consultation in March of 2005 for PSA recurrent carcinoma the prostate. He presented with Gleason 8 adenocarcinoma prostate with a PSA of 7.9 from late 2003. He had a radical prostatectomy February 2004. There was no extracapsular extension and surgical margins were negative. His PSA became undetectable postoperatively, but rose to 0.24 by February 2005. He underwent salvage radiation therapy to the prostate bed, completing this on 11/05/2003. Unfortunately, his PSA continue to rise in his most recent PSA from 09/07/2011 rising to 20.96. Dr. Retta Diones offered him Casodex/Proscar therapy as an alternative to androgen deprivation therapy. He does have erectile dysfunction and recently underwent circumcision for painful phimosis for which she underwent circumcision. I should mention that his last bone scan was without evidence for metastatic disease this was performed on 05/14/2011.  Physical examination: Alert and oriented. Wt Readings from Last 3 Encounters:  10/04/11 208 lb 3.2 oz (94.439 kg)  06/08/11 197 lb (89.359 kg)  06/08/11 197 lb (89.359 kg)   Temp Readings from Last 3 Encounters:  10/04/11 97 F (36.1 C) Oral  06/08/11 96.8 F (36 C)   06/08/11 96.8 F (36 C)    BP Readings from Last 3 Encounters:  10/04/11 147/81  06/08/11 139/77  06/08/11 139/77   Pulse Readings from Last 3 Encounters:  10/04/11 70  06/08/11 63  06/08/11 63   Head and neck examination: Grossly unremarkable. Chest: On inspection of the breasts there is no gynecomastia.  Laboratory data: PSA 20.96 from Sep 07, 2011  Impression: PSA recurrent carcinoma  prostate. I'm in agreement with hormone therapy at this time, I feel you would benefit from prophylactic breast irradiation to prevent painful gynecomastia. I discussed the potential acute and late toxicities of radiation therapy which should be well tolerated. He'll return here for simulation/treatment planning on Monday, June 17 a complete his therapy by Thursday, June 20. Consent is signed today.  30 minutes was spent face-to-face with the patient, primarily counseling the patient.

## 2011-10-04 NOTE — Progress Notes (Signed)
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

## 2011-10-04 NOTE — Addendum Note (Signed)
Encounter addended by: Glennie Hawk, RN on: 10/04/2011  2:32 PM<BR>     Documentation filed: Charges VN

## 2011-10-08 ENCOUNTER — Ambulatory Visit
Admission: RE | Admit: 2011-10-08 | Discharge: 2011-10-08 | Disposition: A | Discharge status: Home / Self Care | Payer: Medicare Other | Source: Ambulatory Visit | Attending: Radiation Oncology | Admitting: Radiation Oncology

## 2011-10-08 DIAGNOSIS — C61 Malignant neoplasm of prostate: Secondary | ICD-10-CM

## 2011-10-08 NOTE — Progress Notes (Signed)
Simulation/treatment planning note:  The patient was taken to the linear accelerator. His arms were placed extended on a wing board. He was set up to LAO to his left breast and RAO to his right breast. One standard block was utilized to contour the field. This was approximately 7 cm in greatest diameter. I'm prescribing 1500 cGy in 3 sessions utilizing 9 MEV electrons. 0.5 cm custom bolus will be constructed in applied to his skin on the first day of his treatment. A special port plan is requested.

## 2011-10-08 NOTE — Progress Notes (Signed)
Met with patient to discuss RO billing.  Patient had no concerns today. 

## 2011-10-09 ENCOUNTER — Ambulatory Visit
Admission: RE | Admit: 2011-10-09 | Discharge: 2011-10-09 | Disposition: A | Discharge status: Home / Self Care | Payer: Medicare Other | Source: Ambulatory Visit | Attending: Radiation Oncology | Admitting: Radiation Oncology

## 2011-10-09 DIAGNOSIS — C61 Malignant neoplasm of prostate: Secondary | ICD-10-CM

## 2011-10-09 NOTE — Progress Notes (Signed)
Weekly Management Note:  Site: Bilateral breasts Current Dose:  500  cGy Projected Dose: 1500  cGy  Narrative: The patient is seen today for routine under treatment assessment. CBCT/MVCT images/port films were reviewed. The chart was reviewed.   No complaints today.  Physical Examination: There were no vitals filed for this visit..  Weight:  . No skin changes.  Impression: Tolerating radiation therapy well.  Plan: Continue radiation therapy as planned. He'll finish his radiation therapy this Thursday. He'll be followed by Dr. Retta Diones he'll see him back in September. I told him that he may start his Casodex next week.

## 2011-10-09 NOTE — Progress Notes (Signed)
Post sim ed completed w/pt. All questions answered.

## 2011-10-09 NOTE — Addendum Note (Signed)
Encounter addended by: Glennie Hawk, RN on: 10/09/2011 12:59 PM<BR>     Documentation filed: Notes Section, Inpatient Document Flowsheet

## 2011-10-10 ENCOUNTER — Ambulatory Visit
Admission: RE | Admit: 2011-10-10 | Discharge: 2011-10-10 | Disposition: A | Discharge status: Home / Self Care | Payer: Medicare Other | Source: Ambulatory Visit | Attending: Radiation Oncology | Admitting: Radiation Oncology

## 2011-10-11 ENCOUNTER — Encounter: Payer: Self-pay | Admitting: Radiation Oncology

## 2011-10-11 ENCOUNTER — Ambulatory Visit
Admission: RE | Admit: 2011-10-11 | Discharge: 2011-10-11 | Disposition: A | Discharge status: Home / Self Care | Payer: Medicare Other | Source: Ambulatory Visit | Attending: Radiation Oncology | Admitting: Radiation Oncology

## 2011-10-11 NOTE — Progress Notes (Addendum)
[facility] Radiation Oncology End of Treatment Note  Name:Cody Moss  Date: 10/11/2011 NFA:[id number] DOB:1940/08/26   Status:outpatient    CC: Dr. Marcine Matar  REFERRING PHYSICIAN: Dr. Marcine Matar    DIAGNOSIS: Metastatic carcinoma prostate   INDICATION FOR TREATMENT: Prophylactic to prevent painful gynecomastia   TREATMENT DATES: June 18 through 10/11/2011                          SITE/DOSE:    Bilateral breasts 1500 cGy 3 sessions                        BEAMS/ENERGY:    9 MEV electrons               NARRATIVE: The patient tolerated treatment well with no significant skin toxicity by completion of therapy.                            PLAN: He can start his Casodex next week. She'll be followed by Dr. Retta Diones. No formal followup visit with me. Patient instructed to call if questions or worsening complaints in interim.

## 2012-01-24 ENCOUNTER — Encounter: Payer: Self-pay | Admitting: Internal Medicine

## 2012-01-30 ENCOUNTER — Encounter: Payer: Self-pay | Admitting: Internal Medicine

## 2012-02-22 HISTORY — PX: CATARACT EXTRACTION: SUR2

## 2012-03-14 ENCOUNTER — Ambulatory Visit (AMBULATORY_SURGERY_CENTER): Payer: Medicare Other | Admitting: *Deleted

## 2012-03-14 VITALS — Ht 71.0 in | Wt 204.2 lb

## 2012-03-14 DIAGNOSIS — Z1211 Encounter for screening for malignant neoplasm of colon: Secondary | ICD-10-CM

## 2012-03-14 MED ORDER — MOVIPREP 100 G PO SOLR: ORAL | Status: DC

## 2012-04-03 ENCOUNTER — Telehealth: Payer: Self-pay | Admitting: Internal Medicine

## 2012-04-03 NOTE — Telephone Encounter (Signed)
Wife is concerned that pt ate beans 2 days before procedure.  Pt is on clear liquids today.  Told pt okay for procedure.

## 2012-04-04 ENCOUNTER — Ambulatory Visit (AMBULATORY_SURGERY_CENTER): Payer: Medicare Other | Admitting: Internal Medicine

## 2012-04-04 ENCOUNTER — Encounter: Payer: Self-pay | Admitting: Internal Medicine

## 2012-04-04 VITALS — BP 112/55 | HR 53 | Temp 96.2°F | Resp 15 | Ht 71.0 in | Wt 204.0 lb

## 2012-04-04 DIAGNOSIS — Z86010 Personal history of colonic polyps: Secondary | ICD-10-CM

## 2012-04-04 DIAGNOSIS — Z1211 Encounter for screening for malignant neoplasm of colon: Secondary | ICD-10-CM

## 2012-04-04 MED ORDER — SODIUM CHLORIDE 0.9 % IV SOLN: 500.0000 mL | INTRAVENOUS | Status: DC

## 2012-04-04 NOTE — Progress Notes (Signed)
No egg or soy allergy per pt. ewm 

## 2012-04-04 NOTE — Patient Instructions (Addendum)

## 2012-04-04 NOTE — Op Note (Signed)
[facility] [address] [place], [address]   COLONOSCOPY PROCEDURE REPORT  PATIENT: Cody Moss, Cody Moss  MR#: [id number] BIRTHDATE: 1940-12-16 , 71  yrs. old GENDER: Male ENDOSCOPIST: Hart Carwin, MD REFERRED BY:  Lupita Raider, M.D. PROCEDURE DATE:  04/04/2012 PROCEDURE:   Colonoscopy, surveillance ASA CLASS:   Class II INDICATIONS:Patient's personal history of adenomatous colon polyps.  MEDICATIONS: MAC sedation, administered by CRNA and propofol (Diprivan) 300mg  IV  DESCRIPTION OF PROCEDURE:   After the risks and benefits and of the procedure were explained, informed consent was obtained.  A digital rectal exam revealed no abnormalities of the rectum.    The LB [id number]  endoscope was introduced through the anus and advanced to the cecum, which was identified by both the appendix and ileocecal valve .  The quality of the prep was good, using MoviPrep .  The instrument was then slowly withdrawn as the colon was fully examined.     COLON FINDINGS: Moderate diverticulosis was noted throughout the entire examined colon.     Retroflexed views revealed no abnormalities.     The scope was then withdrawn from the patient and the procedure completed.  COMPLICATIONS: There were no complications. ENDOSCOPIC IMPRESSION: Moderate diverticulosis was noted throughout the entire examined colon small internal hemorrhoids  RECOMMENDATIONS: High fiber diet   REPEAT EXAM: In 10 year(s)  for Colonoscopy.  cc:  _______________________________ eSignedHart Carwin, MD 04/04/2012 10:16 AM

## 2012-04-04 NOTE — Progress Notes (Signed)
Patient did not experience any of the following events: a burn prior to discharge; a fall within the facility; wrong site/side/patient/procedure/implant event; or a hospital transfer or hospital admission upon discharge from the facility. (G8907) Patient did not have preoperative order for IV antibiotic SSI prophylaxis. (G8918)  

## 2012-04-07 ENCOUNTER — Telehealth: Payer: Self-pay | Admitting: *Deleted

## 2012-04-07 NOTE — Telephone Encounter (Signed)
  Follow up Call-  Call back number 04/04/2012  Post procedure Call Back phone  # [phone]  Permission to leave phone message Yes     Patient questions:  Do you have a fever, pain , or abdominal swelling? no Pain Score  0 *  Have you tolerated food without any problems? yes  Have you been able to return to your normal activities? yes  Do you have any questions about your discharge instructions: Diet   no Medications  no Follow up visit  no  Do you have questions or concerns about your Care? no  Actions: * If pain score is 4 or above: No action needed, pain <4.

## 2012-04-07 NOTE — Telephone Encounter (Signed)
  Follow up Call-  Call back number 04/04/2012  Post procedure Call Back phone  # [phone]  Permission to leave phone message Yes     Patient questions:  Do you have a fever, pain , or abdominal swelling? no Pain Score  0 *  Have you tolerated food without any problems? yes  Have you been able to return to your normal activities? yes  Do you have any questions about your discharge instructions: Diet   no Medications  no Follow up visit  no  Do you have questions or concerns about your Care? no  Actions: * If pain score is 4 or above: No action needed, pain <4.   

## 2013-02-24 ENCOUNTER — Encounter: Payer: Self-pay | Admitting: Cardiology

## 2013-03-03 ENCOUNTER — Emergency Department (HOSPITAL_COMMUNITY)
Admission: EM | Admit: 2013-03-03 | Discharge: 2013-03-03 | Disposition: A | Discharge status: Home / Self Care | Payer: Medicare Other | Attending: Emergency Medicine | Admitting: Emergency Medicine

## 2013-03-03 ENCOUNTER — Encounter (HOSPITAL_COMMUNITY): Payer: Self-pay | Admitting: Emergency Medicine

## 2013-03-03 ENCOUNTER — Emergency Department (HOSPITAL_COMMUNITY): Payer: Medicare Other

## 2013-03-03 DIAGNOSIS — S39012A Strain of muscle, fascia and tendon of lower back, initial encounter: Secondary | ICD-10-CM

## 2013-03-03 DIAGNOSIS — Y939 Activity, unspecified: Secondary | ICD-10-CM | POA: Insufficient documentation

## 2013-03-03 DIAGNOSIS — Z9889 Other specified postprocedural states: Secondary | ICD-10-CM | POA: Insufficient documentation

## 2013-03-03 DIAGNOSIS — M549 Dorsalgia, unspecified: Secondary | ICD-10-CM

## 2013-03-03 DIAGNOSIS — I739 Peripheral vascular disease, unspecified: Secondary | ICD-10-CM | POA: Insufficient documentation

## 2013-03-03 DIAGNOSIS — Z7982 Long term (current) use of aspirin: Secondary | ICD-10-CM | POA: Insufficient documentation

## 2013-03-03 DIAGNOSIS — I10 Essential (primary) hypertension: Secondary | ICD-10-CM | POA: Insufficient documentation

## 2013-03-03 DIAGNOSIS — Y929 Unspecified place or not applicable: Secondary | ICD-10-CM | POA: Insufficient documentation

## 2013-03-03 DIAGNOSIS — Z79899 Other long term (current) drug therapy: Secondary | ICD-10-CM | POA: Insufficient documentation

## 2013-03-03 DIAGNOSIS — E119 Type 2 diabetes mellitus without complications: Secondary | ICD-10-CM | POA: Insufficient documentation

## 2013-03-03 DIAGNOSIS — Z888 Allergy status to other drugs, medicaments and biological substances status: Secondary | ICD-10-CM | POA: Insufficient documentation

## 2013-03-03 DIAGNOSIS — I2510 Atherosclerotic heart disease of native coronary artery without angina pectoris: Secondary | ICD-10-CM | POA: Insufficient documentation

## 2013-03-03 DIAGNOSIS — Z8546 Personal history of malignant neoplasm of prostate: Secondary | ICD-10-CM | POA: Insufficient documentation

## 2013-03-03 DIAGNOSIS — Z9849 Cataract extraction status, unspecified eye: Secondary | ICD-10-CM | POA: Insufficient documentation

## 2013-03-03 DIAGNOSIS — Z923 Personal history of irradiation: Secondary | ICD-10-CM | POA: Insufficient documentation

## 2013-03-03 DIAGNOSIS — F17200 Nicotine dependence, unspecified, uncomplicated: Secondary | ICD-10-CM | POA: Insufficient documentation

## 2013-03-03 DIAGNOSIS — Z880 Allergy status to penicillin: Secondary | ICD-10-CM | POA: Insufficient documentation

## 2013-03-03 DIAGNOSIS — S335XXA Sprain of ligaments of lumbar spine, initial encounter: Secondary | ICD-10-CM | POA: Insufficient documentation

## 2013-03-03 DIAGNOSIS — X500XXA Overexertion from strenuous movement or load, initial encounter: Secondary | ICD-10-CM | POA: Insufficient documentation

## 2013-03-03 DIAGNOSIS — Z87441 Personal history of nephrotic syndrome: Secondary | ICD-10-CM | POA: Insufficient documentation

## 2013-03-03 DIAGNOSIS — E785 Hyperlipidemia, unspecified: Secondary | ICD-10-CM | POA: Insufficient documentation

## 2013-03-03 MED ORDER — HYDROCODONE-ACETAMINOPHEN 5-325 MG PO TABS: 2.0000 | ORAL_TABLET | ORAL | Status: DC | PRN

## 2013-03-03 MED ORDER — DIAZEPAM 5 MG PO TABS
10.0000 mg | ORAL_TABLET | Freq: Once | ORAL | Status: AC
  Administered 2013-03-03: 10 mg via ORAL
  Filled 2013-03-03: qty 2

## 2013-03-03 MED ORDER — MORPHINE SULFATE 4 MG/ML IJ SOLN
6.0000 mg | Freq: Once | INTRAMUSCULAR | Status: AC
  Administered 2013-03-03: 6 mg via INTRAMUSCULAR
  Filled 2013-03-03: qty 2

## 2013-03-03 MED ORDER — DIAZEPAM 5 MG PO TABS: 5.0000 mg | ORAL_TABLET | Freq: Four times a day (QID) | ORAL | Status: DC | PRN

## 2013-03-03 NOTE — ED Notes (Signed)
Pt c/o left lower back pain.  St's he does a lot of heavy lifting at work.  St's he woke up this am with pain and tried to go to work but pain became worse.
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
## 2013-03-18 ENCOUNTER — Ambulatory Visit: Payer: Medicare Other

## 2013-03-18 ENCOUNTER — Ambulatory Visit (INDEPENDENT_AMBULATORY_CARE_PROVIDER_SITE_OTHER): Payer: Medicare Other | Admitting: Family Medicine

## 2013-03-18 VITALS — BP 122/74 | HR 74 | Temp 98.0°F | Resp 17 | Ht 71.0 in | Wt 196.0 lb

## 2013-03-18 DIAGNOSIS — M129 Arthropathy, unspecified: Secondary | ICD-10-CM

## 2013-03-18 DIAGNOSIS — M79672 Pain in left foot: Secondary | ICD-10-CM

## 2013-03-18 DIAGNOSIS — M1990 Unspecified osteoarthritis, unspecified site: Secondary | ICD-10-CM

## 2013-03-18 DIAGNOSIS — M79609 Pain in unspecified limb: Secondary | ICD-10-CM

## 2013-03-18 DIAGNOSIS — R600 Localized edema: Secondary | ICD-10-CM

## 2013-03-18 DIAGNOSIS — R609 Edema, unspecified: Secondary | ICD-10-CM

## 2013-03-18 LAB — COMPREHENSIVE METABOLIC PANEL
ALT: 11 U/L (ref 0–53)
AST: 12 U/L (ref 0–37)
Albumin: 4.5 g/dL (ref 3.5–5.2)
Alkaline Phosphatase: 46 U/L (ref 39–117)
BUN: 24 mg/dL — ABNORMAL HIGH (ref 6–23)
CO2: 27 mEq/L (ref 19–32)
Calcium: 9.5 mg/dL (ref 8.4–10.5)
Chloride: 99 mEq/L (ref 96–112)
Creat: 1.17 mg/dL (ref 0.50–1.35)
Glucose, Bld: 140 mg/dL — ABNORMAL HIGH (ref 70–99)
Potassium: 4.3 mEq/L (ref 3.5–5.3)
Sodium: 136 mEq/L (ref 135–145)
Total Bilirubin: 0.5 mg/dL (ref 0.3–1.2)
Total Protein: 7.4 g/dL (ref 6.0–8.3)

## 2013-03-18 LAB — POCT UA - MICROSCOPIC ONLY
Casts, Ur, LPF, POC: NEGATIVE
Crystals, Ur, HPF, POC: NEGATIVE
Epithelial cells, urine per micros: NEGATIVE
Mucus, UA: NEGATIVE
Yeast, UA: NEGATIVE

## 2013-03-18 LAB — POCT URINALYSIS DIPSTICK
Bilirubin, UA: NEGATIVE
Blood, UA: NEGATIVE
Glucose, UA: NEGATIVE
Ketones, UA: NEGATIVE
Leukocytes, UA: NEGATIVE
Nitrite, UA: NEGATIVE
Protein, UA: NEGATIVE
Spec Grav, UA: <=1.005
Urobilinogen, UA: 0.2
pH, UA: 6

## 2013-03-18 LAB — URIC ACID: Uric Acid, Serum: 7.6 mg/dL (ref 4.0–7.8)

## 2013-03-18 MED ORDER — HYDROCODONE-ACETAMINOPHEN 5-325 MG PO TABS: 2.0000 | ORAL_TABLET | Freq: Four times a day (QID) | ORAL | Status: DC | PRN

## 2013-03-18 MED ORDER — MELOXICAM 15 MG PO TABS: 15.0000 mg | ORAL_TABLET | Freq: Every day | ORAL | Status: DC | PRN
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
## 2013-03-19 ENCOUNTER — Emergency Department (HOSPITAL_COMMUNITY)
Admission: EM | Admit: 2013-03-19 | Discharge: 2013-03-19 | Disposition: A | Discharge status: Home / Self Care | Payer: Medicare Other | Attending: Emergency Medicine | Admitting: Emergency Medicine

## 2013-03-19 ENCOUNTER — Emergency Department (HOSPITAL_COMMUNITY): Payer: Medicare Other

## 2013-03-19 DIAGNOSIS — F17200 Nicotine dependence, unspecified, uncomplicated: Secondary | ICD-10-CM | POA: Insufficient documentation

## 2013-03-19 DIAGNOSIS — M109 Gout, unspecified: Secondary | ICD-10-CM | POA: Insufficient documentation

## 2013-03-19 DIAGNOSIS — Z79899 Other long term (current) drug therapy: Secondary | ICD-10-CM | POA: Insufficient documentation

## 2013-03-19 DIAGNOSIS — E119 Type 2 diabetes mellitus without complications: Secondary | ICD-10-CM | POA: Insufficient documentation

## 2013-03-19 DIAGNOSIS — I10 Essential (primary) hypertension: Secondary | ICD-10-CM | POA: Insufficient documentation

## 2013-03-19 DIAGNOSIS — Z9889 Other specified postprocedural states: Secondary | ICD-10-CM | POA: Insufficient documentation

## 2013-03-19 DIAGNOSIS — E785 Hyperlipidemia, unspecified: Secondary | ICD-10-CM | POA: Insufficient documentation

## 2013-03-19 DIAGNOSIS — Z87448 Personal history of other diseases of urinary system: Secondary | ICD-10-CM | POA: Insufficient documentation

## 2013-03-19 DIAGNOSIS — IMO0002 Reserved for concepts with insufficient information to code with codable children: Secondary | ICD-10-CM | POA: Insufficient documentation

## 2013-03-19 DIAGNOSIS — I2510 Atherosclerotic heart disease of native coronary artery without angina pectoris: Secondary | ICD-10-CM | POA: Insufficient documentation

## 2013-03-19 DIAGNOSIS — Z923 Personal history of irradiation: Secondary | ICD-10-CM | POA: Insufficient documentation

## 2013-03-19 DIAGNOSIS — Z9861 Coronary angioplasty status: Secondary | ICD-10-CM | POA: Insufficient documentation

## 2013-03-19 DIAGNOSIS — Z8546 Personal history of malignant neoplasm of prostate: Secondary | ICD-10-CM | POA: Insufficient documentation

## 2013-03-19 DIAGNOSIS — Z880 Allergy status to penicillin: Secondary | ICD-10-CM | POA: Insufficient documentation

## 2013-03-19 DIAGNOSIS — Z8669 Personal history of other diseases of the nervous system and sense organs: Secondary | ICD-10-CM | POA: Insufficient documentation

## 2013-03-19 DIAGNOSIS — Z7982 Long term (current) use of aspirin: Secondary | ICD-10-CM | POA: Insufficient documentation

## 2013-03-19 LAB — CBC WITH DIFFERENTIAL/PLATELET
Basophils Absolute: 0 10*3/uL (ref 0.0–0.1)
Basophils Relative: 0 % (ref 0–1)
Eosinophils Absolute: 0.1 10*3/uL (ref 0.0–0.7)
Eosinophils Relative: 1 % (ref 0–5)
HCT: 33.9 % — ABNORMAL LOW (ref 39.0–52.0)
Hemoglobin: 12 g/dL — ABNORMAL LOW (ref 13.0–17.0)
Lymphocytes Relative: 7 % — ABNORMAL LOW (ref 12–46)
Lymphs Abs: 0.8 10*3/uL (ref 0.7–4.0)
MCH: 32.1 pg (ref 26.0–34.0)
MCHC: 35.4 g/dL (ref 30.0–36.0)
MCV: 90.6 fL (ref 78.0–100.0)
Monocytes Absolute: 1 10*3/uL (ref 0.1–1.0)
Monocytes Relative: 9 % (ref 3–12)
Neutro Abs: 9.3 10*3/uL — ABNORMAL HIGH (ref 1.7–7.7)
Neutrophils Relative %: 83 % — ABNORMAL HIGH (ref 43–77)
Platelets: 202 10*3/uL (ref 150–400)
RBC: 3.74 MIL/uL — ABNORMAL LOW (ref 4.22–5.81)
RDW: 13.4 % (ref 11.5–15.5)
WBC: 11.3 10*3/uL — ABNORMAL HIGH (ref 4.0–10.5)

## 2013-03-19 LAB — BASIC METABOLIC PANEL
BUN: 24 mg/dL — ABNORMAL HIGH (ref 6–23)
CO2: 20 mEq/L (ref 19–32)
Calcium: 8.9 mg/dL (ref 8.4–10.5)
Chloride: 94 mEq/L — ABNORMAL LOW (ref 96–112)
Creatinine, Ser: 1.07 mg/dL (ref 0.50–1.35)
GFR calc Af Amer: 79 mL/min — ABNORMAL LOW (ref >90)
GFR calc non Af Amer: 68 mL/min — ABNORMAL LOW (ref >90)
Glucose, Bld: 263 mg/dL — ABNORMAL HIGH (ref 70–99)
Potassium: 4 mEq/L (ref 3.5–5.1)
Sodium: 130 mEq/L — ABNORMAL LOW (ref 135–145)

## 2013-03-19 LAB — URIC ACID: Uric Acid, Serum: 7.6 mg/dL (ref 4.0–7.8)

## 2013-03-19 MED ORDER — COLCHICINE 0.6 MG PO TABS
0.6000 mg | ORAL_TABLET | Freq: Once | ORAL | Status: AC
  Administered 2013-03-19: 0.6 mg via ORAL
  Filled 2013-03-19: qty 1

## 2013-03-19 MED ORDER — DEXAMETHASONE SODIUM PHOSPHATE 10 MG/ML IJ SOLN
10.0000 mg | Freq: Once | INTRAMUSCULAR | Status: AC
  Administered 2013-03-19: 10 mg via INTRAVENOUS
  Filled 2013-03-19: qty 1

## 2013-03-19 MED ORDER — METHYLPREDNISOLONE 4 MG PO KIT: PACK | ORAL | Status: DC

## 2013-03-19 MED ORDER — OXYCODONE-ACETAMINOPHEN 5-325 MG PO TABS: 2.0000 | ORAL_TABLET | ORAL | Status: DC | PRN

## 2013-03-19 MED ORDER — MORPHINE SULFATE 4 MG/ML IJ SOLN: 4.0000 mg | INTRAMUSCULAR | Status: DC | PRN

## 2013-03-19 MED ORDER — COLCHICINE 0.6 MG PO TABS: 0.6000 mg | ORAL_TABLET | Freq: Every day | ORAL | Status: AC

## 2013-03-19 MED ORDER — KETOROLAC TROMETHAMINE 30 MG/ML IJ SOLN
30.0000 mg | Freq: Once | INTRAMUSCULAR | Status: AC
  Administered 2013-03-19: 30 mg via INTRAVENOUS
  Filled 2013-03-19: qty 1

## 2013-03-19 MED ORDER — ONDANSETRON HCL 4 MG/2ML IJ SOLN
4.0000 mg | Freq: Once | INTRAMUSCULAR | Status: AC
  Administered 2013-03-19: 4 mg via INTRAVENOUS
  Filled 2013-03-19: qty 2
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
## 2013-03-19 NOTE — ED Notes (Signed)
Left foot elevated and ice pack applied

## 2013-03-19 NOTE — ED Notes (Signed)
Transported to xray on stretcher

## 2013-03-19 NOTE — ED Notes (Signed)
C/o left ankle pain and swelling onset last Fri. States he wore a new pair of tennis shoes thought that was the problem . Sat left foot was swollen, Mon felt a little better however last 2 days increased pain and swelling in ankle. Ankle is swollen and red. Positive left pedal pulse.

## 2013-03-19 NOTE — ED Notes (Signed)
Returned from xray

## 2013-05-01 ENCOUNTER — Encounter: Payer: Self-pay | Admitting: *Deleted

## 2013-05-06 ENCOUNTER — Ambulatory Visit (INDEPENDENT_AMBULATORY_CARE_PROVIDER_SITE_OTHER): Payer: Medicare Other | Admitting: Internal Medicine

## 2013-05-06 ENCOUNTER — Encounter: Payer: Self-pay | Admitting: Internal Medicine

## 2013-05-06 VITALS — BP 128/72 | HR 58 | Ht 70.5 in | Wt 194.9 lb

## 2013-05-06 DIAGNOSIS — I10 Essential (primary) hypertension: Secondary | ICD-10-CM

## 2013-05-06 DIAGNOSIS — M109 Gout, unspecified: Secondary | ICD-10-CM | POA: Insufficient documentation

## 2013-05-06 DIAGNOSIS — F17210 Nicotine dependence, cigarettes, uncomplicated: Secondary | ICD-10-CM | POA: Insufficient documentation

## 2013-05-06 DIAGNOSIS — I2510 Atherosclerotic heart disease of native coronary artery without angina pectoris: Secondary | ICD-10-CM

## 2013-05-06 DIAGNOSIS — E785 Hyperlipidemia, unspecified: Secondary | ICD-10-CM

## 2013-05-06 DIAGNOSIS — F17200 Nicotine dependence, unspecified, uncomplicated: Secondary | ICD-10-CM

## 2013-05-06 NOTE — Patient Instructions (Signed)
Your physician wants you to follow-up in: 1 year with Dr. Hilty. You will receive a reminder letter in the mail two months in advance. If you don't receive a letter, please call our office to schedule the follow-up appointment.  
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
## 2013-09-07 ENCOUNTER — Encounter (HOSPITAL_COMMUNITY): Payer: Self-pay | Admitting: Emergency Medicine

## 2013-09-07 ENCOUNTER — Emergency Department (HOSPITAL_COMMUNITY)
Admission: EM | Admit: 2013-09-07 | Discharge: 2013-09-07 | Disposition: A | Discharge status: Home / Self Care | Payer: Medicare Other | Attending: Emergency Medicine | Admitting: Emergency Medicine

## 2013-09-07 DIAGNOSIS — L0211 Cutaneous abscess of neck: Secondary | ICD-10-CM | POA: Insufficient documentation

## 2013-09-07 DIAGNOSIS — I10 Essential (primary) hypertension: Secondary | ICD-10-CM | POA: Insufficient documentation

## 2013-09-07 DIAGNOSIS — Z87448 Personal history of other diseases of urinary system: Secondary | ICD-10-CM | POA: Insufficient documentation

## 2013-09-07 DIAGNOSIS — Z923 Personal history of irradiation: Secondary | ICD-10-CM | POA: Insufficient documentation

## 2013-09-07 DIAGNOSIS — L03221 Cellulitis of neck: Principal | ICD-10-CM

## 2013-09-07 DIAGNOSIS — E785 Hyperlipidemia, unspecified: Secondary | ICD-10-CM | POA: Insufficient documentation

## 2013-09-07 DIAGNOSIS — Z8669 Personal history of other diseases of the nervous system and sense organs: Secondary | ICD-10-CM | POA: Insufficient documentation

## 2013-09-07 DIAGNOSIS — Z9861 Coronary angioplasty status: Secondary | ICD-10-CM | POA: Insufficient documentation

## 2013-09-07 DIAGNOSIS — I2510 Atherosclerotic heart disease of native coronary artery without angina pectoris: Secondary | ICD-10-CM | POA: Insufficient documentation

## 2013-09-07 DIAGNOSIS — Z880 Allergy status to penicillin: Secondary | ICD-10-CM | POA: Insufficient documentation

## 2013-09-07 DIAGNOSIS — Z8546 Personal history of malignant neoplasm of prostate: Secondary | ICD-10-CM | POA: Insufficient documentation

## 2013-09-07 DIAGNOSIS — F17200 Nicotine dependence, unspecified, uncomplicated: Secondary | ICD-10-CM | POA: Insufficient documentation

## 2013-09-07 DIAGNOSIS — E119 Type 2 diabetes mellitus without complications: Secondary | ICD-10-CM | POA: Insufficient documentation

## 2013-09-07 DIAGNOSIS — L0291 Cutaneous abscess, unspecified: Secondary | ICD-10-CM

## 2013-09-07 DIAGNOSIS — Z79899 Other long term (current) drug therapy: Secondary | ICD-10-CM | POA: Insufficient documentation

## 2013-09-07 DIAGNOSIS — Z7982 Long term (current) use of aspirin: Secondary | ICD-10-CM | POA: Insufficient documentation

## 2013-09-07 MED ORDER — HYDROCODONE-ACETAMINOPHEN 5-325 MG PO TABS: 1.0000 | ORAL_TABLET | Freq: Four times a day (QID) | ORAL | Status: DC | PRN

## 2013-09-07 NOTE — Discharge Instructions (Signed)
Abscess Care After An abscess (also called a boil or furuncle) is an infected area that contains a collection of pus. Signs and symptoms of an abscess include pain, tenderness, redness, or hardness, or you may feel a moveable soft area under your skin. An abscess can occur anywhere in the body. The infection may spread to surrounding tissues causing cellulitis. A cut (incision) by the surgeon was made over your abscess and the pus was drained out. Gauze may have been packed into the space to provide a drain that will allow the cavity to heal from the inside outwards. The boil may be painful for 5 to 7 days. Most people with a boil do not have high fevers. Your abscess, if seen early, may not have localized, and may not have been lanced. If not, another appointment may be required for this if it does not get better on its own or with medications. HOME CARE INSTRUCTIONS   Only take over-the-counter or prescription medicines for pain, discomfort, or fever as directed by your caregiver.  When you bathe, soak and then remove gauze or iodoform packs at least daily or as directed by your caregiver. You may then wash the wound gently with mild soapy water. Repack with gauze or do as your caregiver directs. SEEK IMMEDIATE MEDICAL CARE IF:   You develop increased pain, swelling, redness, drainage, or bleeding in the wound site.  You develop signs of generalized infection including muscle aches, chills, fever, or a general ill feeling.  An oral temperature above 102 F (38.9 C) develops, not controlled by medication. See your caregiver for a recheck if you develop any of the symptoms described above. If medications (antibiotics) were prescribed, take them as directed. Document Released: 10/26/2004 Document Revised: 07/02/2011 Document Reviewed: 06/23/2007 [facility] Patient Information 2014 [place]. In 2 days, remove the packing, and allow the skin to heal.  He will most likely have quite a bit of  discharge./Drainage.  Tonight, and maybe tomorrow.  We'll see a drastic decrease in drainage in 24 hours

## 2013-09-07 NOTE — ED Notes (Signed)
Pt reports noticing insect bite to back of neck on 5/13. Pt now has swelling and pain on palpation to site. Denies fever/chills. Denies HA. AO x4.

## 2013-09-07 NOTE — ED Provider Notes (Signed)
Medical screening examination/treatment/procedure(s) were performed by non-physician practitioner and as supervising physician I was immediately available for consultation/collaboration.   EKG Interpretation None        Osvaldo Shipper, MD 09/07/13 [phone]

## 2013-09-07 NOTE — ED Notes (Signed)
Pt has a red swollen lump on left side of neck since Wednesday. Lump has a yellow head. Pt has taken benadryl and tylenol.

## 2013-09-07 NOTE — ED Notes (Signed)
PA at bedside.
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
## 2013-10-02 ENCOUNTER — Other Ambulatory Visit: Payer: Self-pay | Admitting: Urology

## 2013-10-02 DIAGNOSIS — C61 Malignant neoplasm of prostate: Secondary | ICD-10-CM

## 2013-10-15 ENCOUNTER — Encounter (HOSPITAL_COMMUNITY)
Admission: RE | Admit: 2013-10-15 | Discharge: 2013-10-15 | Disposition: A | Discharge status: Home / Self Care | Payer: Medicare Other | Source: Ambulatory Visit | Attending: Urology | Admitting: Urology

## 2013-10-15 DIAGNOSIS — C61 Malignant neoplasm of prostate: Secondary | ICD-10-CM | POA: Insufficient documentation

## 2013-10-15 MED ORDER — TECHNETIUM TC 99M MEDRONATE IV KIT
26.5000 | PACK | Freq: Once | INTRAVENOUS | Status: AC | PRN
  Administered 2013-10-15: 26.5 via INTRAVENOUS

## 2013-11-02 ENCOUNTER — Ambulatory Visit: Payer: Medicare Other | Admitting: Internal Medicine

## 2013-12-08 ENCOUNTER — Other Ambulatory Visit: Payer: Self-pay | Admitting: Family Medicine

## 2013-12-08 ENCOUNTER — Ambulatory Visit
Admission: RE | Admit: 2013-12-08 | Discharge: 2013-12-08 | Disposition: A | Discharge status: Home / Self Care | Payer: Medicare Other | Source: Ambulatory Visit | Attending: Family Medicine | Admitting: Family Medicine

## 2013-12-08 DIAGNOSIS — M5442 Lumbago with sciatica, left side: Secondary | ICD-10-CM

## 2013-12-08 DIAGNOSIS — M25532 Pain in left wrist: Secondary | ICD-10-CM

## 2013-12-10 ENCOUNTER — Ambulatory Visit (INDEPENDENT_AMBULATORY_CARE_PROVIDER_SITE_OTHER): Payer: Medicare Other | Admitting: Internal Medicine

## 2013-12-10 ENCOUNTER — Encounter: Payer: Self-pay | Admitting: Internal Medicine

## 2013-12-10 VITALS — BP 160/70 | HR 58 | Ht 70.5 in | Wt 201.5 lb

## 2013-12-10 DIAGNOSIS — I10 Essential (primary) hypertension: Secondary | ICD-10-CM

## 2013-12-10 DIAGNOSIS — Z8679 Personal history of other diseases of the circulatory system: Secondary | ICD-10-CM

## 2013-12-10 DIAGNOSIS — Z9889 Other specified postprocedural states: Secondary | ICD-10-CM

## 2013-12-10 DIAGNOSIS — E785 Hyperlipidemia, unspecified: Secondary | ICD-10-CM

## 2013-12-10 DIAGNOSIS — I2510 Atherosclerotic heart disease of native coronary artery without angina pectoris: Secondary | ICD-10-CM

## 2013-12-10 NOTE — Patient Instructions (Signed)
Your physician has requested that you have an abdominal aorta duplex. During this test, an ultrasound is used to evaluate the aorta. Allow 30 minutes for this exam. Do not eat after midnight the day before and avoid carbonated beverages   Your physician wants you to follow-up in: 6 months. You will receive a reminder letter in the mail two months in advance. If you don't receive a letter, please call our office to schedule the follow-up appointment.
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
## 2013-12-11 ENCOUNTER — Encounter: Payer: Self-pay | Admitting: Internal Medicine

## 2013-12-11 DIAGNOSIS — Z9889 Other specified postprocedural states: Principal | ICD-10-CM

## 2013-12-11 DIAGNOSIS — Z8679 Personal history of other diseases of the circulatory system: Secondary | ICD-10-CM | POA: Insufficient documentation

## 2013-12-16 ENCOUNTER — Ambulatory Visit (HOSPITAL_COMMUNITY)
Admission: RE | Admit: 2013-12-16 | Discharge: 2013-12-16 | Disposition: A | Discharge status: Home / Self Care | Payer: Medicare Other | Source: Ambulatory Visit | Attending: Cardiovascular Disease | Admitting: Cardiovascular Disease

## 2013-12-16 DIAGNOSIS — I714 Abdominal aortic aneurysm, without rupture, unspecified: Secondary | ICD-10-CM | POA: Insufficient documentation

## 2013-12-16 DIAGNOSIS — Z8679 Personal history of other diseases of the circulatory system: Secondary | ICD-10-CM

## 2013-12-16 DIAGNOSIS — Z9889 Other specified postprocedural states: Secondary | ICD-10-CM | POA: Diagnosis not present

## 2013-12-16 NOTE — Progress Notes (Signed)
Abdominal Aortic Duplex Completed °Brianna L Mazza,RVT °

## 2013-12-18 ENCOUNTER — Encounter (HOSPITAL_COMMUNITY): Payer: Medicare Other

## 2013-12-30 NOTE — Progress Notes (Signed)
LMTCB for test results

## 2013-12-31 ENCOUNTER — Telehealth: Payer: Self-pay | Admitting: Internal Medicine

## 2013-12-31 DIAGNOSIS — Z9889 Other specified postprocedural states: Secondary | ICD-10-CM

## 2013-12-31 NOTE — Telephone Encounter (Signed)
Spoke with wife and informed her of AAA results. She will inform husband. Informed her that doppler will be repeated in 1 year. Test ordered.

## 2013-12-31 NOTE — Telephone Encounter (Signed)
LM to return call. Provided ext #

## 2013-12-31 NOTE — Telephone Encounter (Signed)
Returning your call. °

## 2014-02-05 ENCOUNTER — Other Ambulatory Visit: Payer: Self-pay

## 2014-03-11 ENCOUNTER — Inpatient Hospital Stay (HOSPITAL_COMMUNITY)
Admission: EM | Admit: 2014-03-11 | Discharge: 2014-03-17 | DRG: 603 | Disposition: A | Discharge status: Home / Self Care | Payer: Medicare Other | Attending: Internal Medicine | Admitting: Internal Medicine

## 2014-03-11 ENCOUNTER — Encounter (HOSPITAL_COMMUNITY): Payer: Self-pay | Admitting: Emergency Medicine

## 2014-03-11 DIAGNOSIS — I10 Essential (primary) hypertension: Secondary | ICD-10-CM | POA: Diagnosis present

## 2014-03-11 DIAGNOSIS — Z9841 Cataract extraction status, right eye: Secondary | ICD-10-CM

## 2014-03-11 DIAGNOSIS — Z888 Allergy status to other drugs, medicaments and biological substances status: Secondary | ICD-10-CM

## 2014-03-11 DIAGNOSIS — L03311 Cellulitis of abdominal wall: Secondary | ICD-10-CM | POA: Diagnosis not present

## 2014-03-11 DIAGNOSIS — Z95820 Peripheral vascular angioplasty status with implants and grafts: Secondary | ICD-10-CM

## 2014-03-11 DIAGNOSIS — M109 Gout, unspecified: Secondary | ICD-10-CM | POA: Diagnosis present

## 2014-03-11 DIAGNOSIS — Z8546 Personal history of malignant neoplasm of prostate: Secondary | ICD-10-CM

## 2014-03-11 DIAGNOSIS — A419 Sepsis, unspecified organism: Secondary | ICD-10-CM | POA: Diagnosis present

## 2014-03-11 DIAGNOSIS — Z8679 Personal history of other diseases of the circulatory system: Secondary | ICD-10-CM

## 2014-03-11 DIAGNOSIS — Z9889 Other specified postprocedural states: Secondary | ICD-10-CM

## 2014-03-11 DIAGNOSIS — E785 Hyperlipidemia, unspecified: Secondary | ICD-10-CM | POA: Diagnosis present

## 2014-03-11 DIAGNOSIS — Z923 Personal history of irradiation: Secondary | ICD-10-CM

## 2014-03-11 DIAGNOSIS — Z880 Allergy status to penicillin: Secondary | ICD-10-CM

## 2014-03-11 DIAGNOSIS — L0390 Cellulitis, unspecified: Secondary | ICD-10-CM | POA: Diagnosis present

## 2014-03-11 DIAGNOSIS — I2510 Atherosclerotic heart disease of native coronary artery without angina pectoris: Secondary | ICD-10-CM | POA: Diagnosis present

## 2014-03-11 DIAGNOSIS — F17210 Nicotine dependence, cigarettes, uncomplicated: Secondary | ICD-10-CM | POA: Diagnosis present

## 2014-03-11 DIAGNOSIS — I739 Peripheral vascular disease, unspecified: Secondary | ICD-10-CM | POA: Diagnosis present

## 2014-03-11 DIAGNOSIS — L03314 Cellulitis of groin: Secondary | ICD-10-CM | POA: Diagnosis present

## 2014-03-11 DIAGNOSIS — Z955 Presence of coronary angioplasty implant and graft: Secondary | ICD-10-CM

## 2014-03-11 DIAGNOSIS — Z9842 Cataract extraction status, left eye: Secondary | ICD-10-CM

## 2014-03-11 DIAGNOSIS — Z7982 Long term (current) use of aspirin: Secondary | ICD-10-CM

## 2014-03-11 DIAGNOSIS — E1165 Type 2 diabetes mellitus with hyperglycemia: Secondary | ICD-10-CM | POA: Diagnosis present

## 2014-03-11 LAB — CBC WITH DIFFERENTIAL/PLATELET
Basophils Absolute: 0 10*3/uL (ref 0.0–0.1)
Basophils Relative: 0 % (ref 0–1)
Eosinophils Absolute: 0.3 10*3/uL (ref 0.0–0.7)
Eosinophils Relative: 3 % (ref 0–5)
HCT: 36.8 % — ABNORMAL LOW (ref 39.0–52.0)
Hemoglobin: 12.6 g/dL — ABNORMAL LOW (ref 13.0–17.0)
Lymphocytes Relative: 19 % (ref 12–46)
Lymphs Abs: 2.2 10*3/uL (ref 0.7–4.0)
MCH: 32.6 pg (ref 26.0–34.0)
MCHC: 34.2 g/dL (ref 30.0–36.0)
MCV: 95.1 fL (ref 78.0–100.0)
Monocytes Absolute: 1 10*3/uL (ref 0.1–1.0)
Monocytes Relative: 9 % (ref 3–12)
Neutro Abs: 8 10*3/uL — ABNORMAL HIGH (ref 1.7–7.7)
Neutrophils Relative %: 69 % (ref 43–77)
Platelets: 207 10*3/uL (ref 150–400)
RBC: 3.87 MIL/uL — ABNORMAL LOW (ref 4.22–5.81)
RDW: 13.5 % (ref 11.5–15.5)
WBC: 11.5 10*3/uL — ABNORMAL HIGH (ref 4.0–10.5)

## 2014-03-11 LAB — COMPREHENSIVE METABOLIC PANEL
ALT: 10 U/L (ref 0–53)
AST: 14 U/L (ref 0–37)
Albumin: 4 g/dL (ref 3.5–5.2)
Alkaline Phosphatase: 56 U/L (ref 39–117)
Anion gap: 16 — ABNORMAL HIGH (ref 5–15)
BUN: 25 mg/dL — ABNORMAL HIGH (ref 6–23)
CO2: 25 mEq/L (ref 19–32)
Calcium: 9.5 mg/dL (ref 8.4–10.5)
Chloride: 96 mEq/L (ref 96–112)
Creatinine, Ser: 1.19 mg/dL (ref 0.50–1.35)
GFR calc Af Amer: 69 mL/min — ABNORMAL LOW (ref >90)
GFR calc non Af Amer: 60 mL/min — ABNORMAL LOW (ref >90)
Glucose, Bld: 126 mg/dL — ABNORMAL HIGH (ref 70–99)
Potassium: 4.4 mEq/L (ref 3.7–5.3)
Sodium: 137 mEq/L (ref 137–147)
Total Bilirubin: 0.5 mg/dL (ref 0.3–1.2)
Total Protein: 7.8 g/dL (ref 6.0–8.3)
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
## 2014-03-11 NOTE — ED Notes (Signed)
Pt. 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 No fever or chills. Denies dysuria .

## 2014-03-11 NOTE — ED Notes (Signed)
Pt from home for eval of redness to groin area that started on Sunday, pt states he was working this weekend when he started to notice the area, denies any pain at this time. Denies any fevers, n/v/d-reports tender to touch. nad noted. axox 4.

## 2014-03-12 ENCOUNTER — Emergency Department (HOSPITAL_COMMUNITY): Payer: Medicare Other

## 2014-03-12 ENCOUNTER — Encounter (HOSPITAL_COMMUNITY): Payer: Self-pay

## 2014-03-12 DIAGNOSIS — Z880 Allergy status to penicillin: Secondary | ICD-10-CM | POA: Diagnosis not present

## 2014-03-12 DIAGNOSIS — Z888 Allergy status to other drugs, medicaments and biological substances status: Secondary | ICD-10-CM | POA: Diagnosis not present

## 2014-03-12 DIAGNOSIS — Z923 Personal history of irradiation: Secondary | ICD-10-CM | POA: Diagnosis not present

## 2014-03-12 DIAGNOSIS — I739 Peripheral vascular disease, unspecified: Secondary | ICD-10-CM | POA: Diagnosis not present

## 2014-03-12 DIAGNOSIS — I2510 Atherosclerotic heart disease of native coronary artery without angina pectoris: Secondary | ICD-10-CM | POA: Diagnosis present

## 2014-03-12 DIAGNOSIS — Z7982 Long term (current) use of aspirin: Secondary | ICD-10-CM | POA: Diagnosis not present

## 2014-03-12 DIAGNOSIS — F17200 Nicotine dependence, unspecified, uncomplicated: Secondary | ICD-10-CM

## 2014-03-12 DIAGNOSIS — E785 Hyperlipidemia, unspecified: Secondary | ICD-10-CM

## 2014-03-12 DIAGNOSIS — I10 Essential (primary) hypertension: Secondary | ICD-10-CM | POA: Diagnosis not present

## 2014-03-12 DIAGNOSIS — Z9889 Other specified postprocedural states: Secondary | ICD-10-CM

## 2014-03-12 DIAGNOSIS — M1000 Idiopathic gout, unspecified site: Secondary | ICD-10-CM

## 2014-03-12 DIAGNOSIS — L0390 Cellulitis, unspecified: Secondary | ICD-10-CM | POA: Diagnosis present

## 2014-03-12 DIAGNOSIS — Z9841 Cataract extraction status, right eye: Secondary | ICD-10-CM | POA: Diagnosis not present

## 2014-03-12 DIAGNOSIS — F17210 Nicotine dependence, cigarettes, uncomplicated: Secondary | ICD-10-CM | POA: Diagnosis present

## 2014-03-12 DIAGNOSIS — Z8546 Personal history of malignant neoplasm of prostate: Secondary | ICD-10-CM | POA: Diagnosis not present

## 2014-03-12 DIAGNOSIS — L03311 Cellulitis of abdominal wall: Secondary | ICD-10-CM | POA: Diagnosis present

## 2014-03-12 DIAGNOSIS — Z9842 Cataract extraction status, left eye: Secondary | ICD-10-CM | POA: Diagnosis not present

## 2014-03-12 DIAGNOSIS — E1165 Type 2 diabetes mellitus with hyperglycemia: Secondary | ICD-10-CM | POA: Diagnosis not present

## 2014-03-12 DIAGNOSIS — Z8679 Personal history of other diseases of the circulatory system: Secondary | ICD-10-CM

## 2014-03-12 DIAGNOSIS — A419 Sepsis, unspecified organism: Secondary | ICD-10-CM | POA: Diagnosis present

## 2014-03-12 DIAGNOSIS — M109 Gout, unspecified: Secondary | ICD-10-CM | POA: Diagnosis present

## 2014-03-12 DIAGNOSIS — L03314 Cellulitis of groin: Secondary | ICD-10-CM | POA: Diagnosis present

## 2014-03-12 DIAGNOSIS — Z95820 Peripheral vascular angioplasty status with implants and grafts: Secondary | ICD-10-CM

## 2014-03-12 DIAGNOSIS — Z955 Presence of coronary angioplasty implant and graft: Secondary | ICD-10-CM | POA: Diagnosis not present

## 2014-03-12 LAB — COMPREHENSIVE METABOLIC PANEL
ALT: 6 U/L (ref 0–53)
AST: 8 U/L (ref 0–37)
Albumin: 3.4 g/dL — ABNORMAL LOW (ref 3.5–5.2)
Alkaline Phosphatase: 45 U/L (ref 39–117)
Anion gap: 15 (ref 5–15)
BUN: 23 mg/dL (ref 6–23)
CO2: 23 mEq/L (ref 19–32)
Calcium: 8.8 mg/dL (ref 8.4–10.5)
Chloride: 96 mEq/L (ref 96–112)
Creatinine, Ser: 1.13 mg/dL (ref 0.50–1.35)
GFR calc Af Amer: 74 mL/min — ABNORMAL LOW (ref >90)
GFR calc non Af Amer: 64 mL/min — ABNORMAL LOW (ref >90)
Glucose, Bld: 254 mg/dL — ABNORMAL HIGH (ref 70–99)
Potassium: 4.3 mEq/L (ref 3.7–5.3)
Sodium: 134 mEq/L — ABNORMAL LOW (ref 137–147)
Total Bilirubin: 0.4 mg/dL (ref 0.3–1.2)
Total Protein: 6.8 g/dL (ref 6.0–8.3)

## 2014-03-12 LAB — LACTIC ACID, PLASMA: Lactic Acid, Venous: 2.5 mmol/L — ABNORMAL HIGH (ref 0.5–2.2)

## 2014-03-12 LAB — HEMOGLOBIN A1C
Hgb A1c MFr Bld: 7.2 % — ABNORMAL HIGH (ref <5.7)
Mean Plasma Glucose: 160 mg/dL — ABNORMAL HIGH (ref <117)

## 2014-03-12 LAB — PROTIME-INR
INR: 1.05 (ref 0.00–1.49)
Prothrombin Time: 13.8 seconds (ref 11.6–15.2)

## 2014-03-12 LAB — CBC
HCT: 33.8 % — ABNORMAL LOW (ref 39.0–52.0)
Hemoglobin: 11.2 g/dL — ABNORMAL LOW (ref 13.0–17.0)
MCH: 31.3 pg (ref 26.0–34.0)
MCHC: 33.1 g/dL (ref 30.0–36.0)
MCV: 94.4 fL (ref 78.0–100.0)
Platelets: 192 10*3/uL (ref 150–400)
RBC: 3.58 MIL/uL — ABNORMAL LOW (ref 4.22–5.81)
RDW: 13.7 % (ref 11.5–15.5)
WBC: 8.2 10*3/uL (ref 4.0–10.5)

## 2014-03-12 LAB — GLUCOSE, CAPILLARY
Glucose-Capillary: 137 mg/dL — ABNORMAL HIGH (ref 70–99)
Glucose-Capillary: 161 mg/dL — ABNORMAL HIGH (ref 70–99)
Glucose-Capillary: 200 mg/dL — ABNORMAL HIGH (ref 70–99)
Glucose-Capillary: 175 mg/dL — ABNORMAL HIGH (ref 70–99)

## 2014-03-12 MED ORDER — ACETAMINOPHEN 325 MG PO TABS: 650.0000 mg | ORAL_TABLET | Freq: Four times a day (QID) | ORAL | Status: DC | PRN

## 2014-03-12 MED ORDER — FINASTERIDE 5 MG PO TABS
5.0000 mg | ORAL_TABLET | Freq: Every day | ORAL | Status: DC
  Administered 2014-03-12 – 2014-03-15 (×4): 5 mg via ORAL
  Filled 2014-03-12 (×5): qty 1

## 2014-03-12 MED ORDER — METOPROLOL TARTRATE 25 MG PO TABS
25.0000 mg | ORAL_TABLET | Freq: Two times a day (BID) | ORAL | Status: DC
  Administered 2014-03-12 – 2014-03-17 (×10): 25 mg via ORAL
  Filled 2014-03-12 (×12): qty 1

## 2014-03-12 MED ORDER — PANTOPRAZOLE SODIUM 40 MG PO TBEC
40.0000 mg | DELAYED_RELEASE_TABLET | ORAL | Status: DC | PRN
  Filled 2014-03-12: qty 1

## 2014-03-12 MED ORDER — COLCHICINE 0.6 MG PO TABS
0.6000 mg | ORAL_TABLET | Freq: Every day | ORAL | Status: DC
  Administered 2014-03-12 – 2014-03-17 (×6): 0.6 mg via ORAL
  Filled 2014-03-12 (×7): qty 1

## 2014-03-12 MED ORDER — SODIUM CHLORIDE 0.9 % IV BOLUS (SEPSIS)
1000.0000 mL | Freq: Once | INTRAVENOUS | Status: AC
  Administered 2014-03-12: 1000 mL via INTRAVENOUS

## 2014-03-12 MED ORDER — OLMESARTAN MEDOXOMIL-HCTZ 40-12.5 MG PO TABS: 1.0000 | ORAL_TABLET | Freq: Every day | ORAL | Status: DC

## 2014-03-12 MED ORDER — MORPHINE SULFATE 4 MG/ML IJ SOLN
6.0000 mg | Freq: Once | INTRAMUSCULAR | Status: AC
Start: 2014-03-12 — End: 2014-03-12
  Administered 2014-03-12: 6 mg via INTRAVENOUS
  Filled 2014-03-12: qty 2

## 2014-03-12 MED ORDER — OMEGA-3 FATTY ACIDS 1000 MG PO CAPS: 1200.0000 g | ORAL_CAPSULE | Freq: Two times a day (BID) | ORAL | Status: DC

## 2014-03-12 MED ORDER — HYDROCODONE-ACETAMINOPHEN 5-325 MG PO TABS
1.0000 | ORAL_TABLET | Freq: Four times a day (QID) | ORAL | Status: DC | PRN
  Administered 2014-03-12 – 2014-03-16 (×15): 1 via ORAL
  Filled 2014-03-12 (×15): qty 1

## 2014-03-12 MED ORDER — ACETAMINOPHEN 650 MG RE SUPP: 650.0000 mg | Freq: Four times a day (QID) | RECTAL | Status: DC | PRN

## 2014-03-12 MED ORDER — SODIUM CHLORIDE 0.9 % IV SOLN
INTRAVENOUS | Status: DC
  Administered 2014-03-12 – 2014-03-14 (×5): via INTRAVENOUS

## 2014-03-12 MED ORDER — HEPARIN SODIUM (PORCINE) 5000 UNIT/ML IJ SOLN
5000.0000 [IU] | Freq: Three times a day (TID) | INTRAMUSCULAR | Status: DC
  Administered 2014-03-12 – 2014-03-17 (×12): 5000 [IU] via SUBCUTANEOUS
  Filled 2014-03-12 (×19): qty 1

## 2014-03-12 MED ORDER — VANCOMYCIN HCL 10 G IV SOLR
1250.0000 mg | Freq: Once | INTRAVENOUS | Status: AC
  Administered 2014-03-12: 1250 mg via INTRAVENOUS
  Filled 2014-03-12: qty 1250

## 2014-03-12 MED ORDER — IOHEXOL 300 MG/ML  SOLN
100.0000 mL | Freq: Once | INTRAMUSCULAR | Status: AC | PRN
  Administered 2014-03-12: 100 mL via INTRAVENOUS

## 2014-03-12 MED ORDER — IRBESARTAN 300 MG PO TABS
300.0000 mg | ORAL_TABLET | Freq: Every day | ORAL | Status: DC
  Administered 2014-03-12 – 2014-03-17 (×6): 300 mg via ORAL
  Filled 2014-03-12 (×6): qty 1

## 2014-03-12 MED ORDER — DEXTROSE 5 % IV SOLN
1.0000 g | Freq: Once | INTRAVENOUS | Status: DC
  Filled 2014-03-12: qty 10

## 2014-03-12 MED ORDER — VANCOMYCIN HCL IN DEXTROSE 1-5 GM/200ML-% IV SOLN
1000.0000 mg | Freq: Two times a day (BID) | INTRAVENOUS | Status: DC
  Administered 2014-03-12 – 2014-03-16 (×8): 1000 mg via INTRAVENOUS
  Filled 2014-03-12 (×10): qty 200

## 2014-03-12 MED ORDER — BICALUTAMIDE 50 MG PO TABS
50.0000 mg | ORAL_TABLET | Freq: Every day | ORAL | Status: DC
  Administered 2014-03-12 – 2014-03-15 (×4): 50 mg via ORAL
  Filled 2014-03-12 (×6): qty 1

## 2014-03-12 MED ORDER — ENSURE COMPLETE PO LIQD
237.0000 mL | Freq: Every day | ORAL | Status: DC
  Administered 2014-03-12 – 2014-03-17 (×6): 237 mL via ORAL

## 2014-03-12 MED ORDER — ALLOPURINOL 100 MG PO TABS
100.0000 mg | ORAL_TABLET | Freq: Every day | ORAL | Status: DC
  Administered 2014-03-12 – 2014-03-17 (×6): 100 mg via ORAL
  Filled 2014-03-12 (×6): qty 1

## 2014-03-12 MED ORDER — HYDROCHLOROTHIAZIDE 12.5 MG PO CAPS
12.5000 mg | ORAL_CAPSULE | Freq: Every day | ORAL | Status: DC
  Administered 2014-03-12 – 2014-03-17 (×6): 12.5 mg via ORAL
  Filled 2014-03-12 (×6): qty 1

## 2014-03-12 MED ORDER — DEXTROSE 5 % IV SOLN
1.0000 g | Freq: Three times a day (TID) | INTRAVENOUS | Status: DC
  Administered 2014-03-12 – 2014-03-16 (×13): 1 g via INTRAVENOUS
  Filled 2014-03-12 (×15): qty 1

## 2014-03-12 MED ORDER — ASPIRIN EC 325 MG PO TBEC
325.0000 mg | DELAYED_RELEASE_TABLET | Freq: Every day | ORAL | Status: DC
  Administered 2014-03-12 – 2014-03-17 (×5): 325 mg via ORAL
  Filled 2014-03-12 (×7): qty 1

## 2014-03-12 MED ORDER — OMEGA-3-ACID ETHYL ESTERS 1 G PO CAPS
1.0000 g | ORAL_CAPSULE | Freq: Two times a day (BID) | ORAL | Status: DC
  Administered 2014-03-12 – 2014-03-17 (×11): 1 g via ORAL
  Filled 2014-03-12 (×12): qty 1

## 2014-03-12 MED ORDER — LIDOCAINE-EPINEPHRINE (PF) 2 %-1:200000 IJ SOLN
20.0000 mL | Freq: Once | INTRAMUSCULAR | Status: AC
  Administered 2014-03-12: 20 mL
  Filled 2014-03-12: qty 20

## 2014-03-12 NOTE — Plan of Care (Signed)
Problem: Phase II Progression Outcomes Goal: Temperature < 101 Outcome: Completed/Met Date Met:  03/12/14
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
## 2014-03-12 NOTE — Progress Notes (Signed)
Received pt report from Brittany,RN-ED.

## 2014-03-12 NOTE — ED Notes (Signed)
Dr. Campos at bedside   

## 2014-03-12 NOTE — Progress Notes (Signed)
Attempted to get report, nurse unavailable at this time.

## 2014-03-12 NOTE — Plan of Care (Signed)
Problem: Phase II Progression Outcomes Goal: Tolerating diet Outcome: Completed/Met Date Met:  03/12/14     

## 2014-03-12 NOTE — Progress Notes (Signed)
ANTIBIOTIC CONSULT NOTE - INITIAL  Pharmacy Consult for aztreonam Indication: cellulitis  Allergies  Allergen Reactions  . Statins Other (See Comments)    MUSCLE CRAMPS  . Penicillins Rash    Patient Measurements: Height: 5' 10.5" (179.1 cm) Weight: 201 lb 8 oz (91.4 kg) IBW/kg (Calculated) : 74.15  Vital Signs: Temp: 98.7 F (37.1 C) (11/20 0404) Temp Source: Oral (11/20 0404) BP: 108/50 mmHg (11/20 0404) Pulse Rate: 93 (11/20 0404)  Labs:  Recent Labs  03/11/14 1928  WBC 11.5*  HGB 12.6*  PLT 207  CREATININE 1.19   Estimated Creatinine Clearance: 65.3 mL/min (by C-G formula based on Cr of 1.19).  Medical History: Past Medical History  Diagnosis Date  . Hypertension   . S/P AAA repair 2002  . S/P angioplasty with stent   . Prostate cancer 05/25/2002    prostatectomy  . Nocturia   . Phimosis   . Hyperlipidemia   . Cataract immature   . Peripheral vascular disease S/P AAA AND AORTOBI-ILIAC BYPASS  . Coronary artery disease CARDIOLOGIST - DR LITTLE - LAST VISIT 05-30-2011-- WILL REQUEST NOTE, ECHO AND STRESS TEST    DENIES S & S  . Hx of radiation therapy 09/13/03 - 11/05/03    pelvis/prostate bed, Dr Cristela Felt  . Diabetes mellitus     "prediabetic", no meds    Medications:  Prescriptions prior to admission  Medication Sig Dispense Refill Last Dose  . allopurinol (ZYLOPRIM) 100 MG tablet Take 100 mg by mouth daily.   12 03/11/2014 at Unknown time  . aspirin EC 325 MG tablet Take 325 mg by mouth daily.   03/11/2014 at Unknown time  . bicalutamide (CASODEX) 50 MG tablet Take 50 mg by mouth daily.    03/11/2014 at Unknown time  . colchicine 0.6 MG tablet Take 1 tablet (0.6 mg total) by mouth daily. 30 tablet 0 unknown  . finasteride (PROSCAR) 5 MG tablet Take 5 mg by mouth daily.    03/11/2014 at Unknown time  . fish oil-omega-3 fatty acids 1000 MG capsule Take 1,200 g by mouth 2 (two) times daily.   03/11/2014 at Unknown time  . metoprolol (LOPRESSOR) 50 MG  tablet Take 25 mg by mouth 2 (two) times daily.    03/11/2014 at 0800  . olmesartan-hydrochlorothiazide (BENICAR HCT) 40-12.5 MG per tablet Take 1 tablet by mouth daily.   03/11/2014 at Unknown time  . pantoprazole (PROTONIX) 40 MG tablet Take 40 mg by mouth as needed (gerd, acid reflux).    03/11/2014 at Unknown time  . ranitidine (ZANTAC) 75 MG tablet Take 75 mg by mouth at bedtime.    03/10/2014 at Unknown time  . HYDROcodone-acetaminophen (NORCO/VICODIN) 5-325 MG per tablet Take 1 tablet by mouth every 6 (six) hours as needed. 10 tablet 0 Taking  . predniSONE (DELTASONE) 10 MG tablet as directed.   Taking   Scheduled:  . allopurinol  100 mg Oral Daily  . aspirin EC  325 mg Oral Daily  . bicalutamide  50 mg Oral Daily  . colchicine  0.6 mg Oral Daily  . finasteride  5 mg Oral Daily  . fish oil-omega-3 fatty acids  1,200 g Oral BID  . heparin  5,000 Units Subcutaneous 3 times per day  . metoprolol  25 mg Oral BID  . sodium chloride  1,000 mL Intravenous Once  . vancomycin  1,250 mg Intravenous Once   Infusions:  . sodium chloride 100 mL/hr at 03/12/14 0401    Assessment: 73yo  male c/o progressive skin infection w/ redness and swelling at pubis and penis since Sunday, no abscess or gas on CT, to begin IV ABX for cellulitis.  Plan:  Will begin aztreonam 1g IV Q8H and monitor CBC and Cx.  Wynona Neat, PharmD, BCPS  03/12/2014,4:22 AM
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
## 2014-03-12 NOTE — Plan of Care (Signed)
Problem: Phase I Progression Outcomes Goal: OOB as tolerated unless otherwise ordered Outcome: Completed/Met Date Met:  03/12/14 Goal: Voiding-avoid urinary catheter unless indicated Outcome: Not Applicable Date Met:  63/78/58 Goal: Temperature < 102 Outcome: Not Applicable Date Met:  85/02/77

## 2014-03-12 NOTE — Progress Notes (Addendum)
[facility] PROGRESS NOTE  Cody Moss YIF:[id number] DOB: Dec 22, 1940 DOA: 03/11/2014 PCP: [name], MD   Brief narrative 73 y.o. male with past medical history of smoking, coronary artery disease (s/p of stent placement 1996), hypertension, hyperlipidemia, peripheral vascular disease, AAA repair 2002, prostate cancer (s/p of surgery and irradiation therapy 2004), who presents with tenderness over abdominal wall of suprapubic area for past few days with finding of cellulitis over left groin. No abscess or gas seen on CT abdomen.  Assessment/Plan: Abdominal wall cellulitis On empiric vancomycin and aztreonam. Afebrile. leucoytosis resolved. Patient reports area of cellulitis to have improved. Continue IV fluids. Follow blood culture. When necessary pain control. Denies any pain at this time.  Coronary artery disease History of stent placement in 96. Continue aspirin, metoprolol. Continue fish oil.  Hypertension Low normal blood pressure on admission. Resume metoprolol, benicar-HCTZ.  Gout Stable. Continue home dose also seen in allopurinol  History of prostate cancer Status post surgery and radiation in 2004. Continue Proscar and bicalutamide.  tobacco abuse counseled on cessation. Reports nicotine patch did not help in past. Plans to try nicotine gum or chantix. Wants to d/w PCP.  Elevated blood glucose Reports history of prediabetes. Fsg elevated on admission. Check A1c  Diet: Cardiac  DVT prophylaxis: sq heparin   Code Status: Full code Family Communication: wife and daughter at bedside Disposition Plan: home possibly tomorrow if improved   Consultants:  None  Procedures:  CT abd and pelvis  Antibiotics:  IV vanco and aztreonam ( 11/20--)  HPI/Subjective: Patient seen and examined. Denies any pain over the left groin area. Afebrile  Objective: Filed Vitals:   03/12/14 0432  BP: 115/57  Pulse: 88  Temp: 98.6 F (37 C)  Resp: 20     Intake/Output Summary (Last 24 hours) at 03/12/14 1158 Last data filed at 03/12/14 8676  Gross per 24 hour  Intake    360 ml  Output      0 ml  Net    360 ml   Filed Weights   03/12/14 0407  Weight: 85.412 kg (188 lb 4.8 oz)    Exam:   General:  Elderly male in no acute distress  HEENT: Moist oral mucosa  Chest: Clear to auscultation bilaterally  CVS: Normal S1-S2, no murmurs  Abdomen: Soft, nondistended, nontender, erythema over left groin 5x5 cm with some induration, non tender, no discharge.BS+  Ext: warm, no edema   Data Reviewed: Basic Metabolic Panel:  Recent Labs Lab 03/11/14 1928 03/12/14 0355  NA 137 134*  K 4.4 4.3  CL 96 96  CO2 25 23  GLUCOSE 126* 254*  BUN 25* 23  CREATININE 1.19 1.13  CALCIUM 9.5 8.8   Liver Function Tests:  Recent Labs Lab 03/11/14 1928 03/12/14 0355  AST 14 8  ALT 10 6  ALKPHOS 56 45  BILITOT 0.5 0.4  PROT 7.8 6.8  ALBUMIN 4.0 3.4*   No results for input(s): LIPASE, AMYLASE in the last 168 hours. No results for input(s): AMMONIA in the last 168 hours. CBC:  Recent Labs Lab 03/11/14 1928 03/12/14 0355  WBC 11.5* 8.2  NEUTROABS 8.0*  --   HGB 12.6* 11.2*  HCT 36.8* 33.8*  MCV 95.1 94.4  PLT 207 192   Cardiac Enzymes: No results for input(s): CKTOTAL, CKMB, CKMBINDEX, TROPONINI in the last 168 hours. BNP (last 3 results) No results for input(s): PROBNP in the last 8760 hours. CBG:  Recent Labs Lab 03/12/14 0821  GLUCAP 137*  No results found for this or any previous visit (from the past 240 hour(s)).   Studies: Ct Abdomen Pelvis W Contrast  03/12/2014   CLINICAL DATA:  Cellulitis in the groin. To rule out Fournier's gangrene.  EXAM: CT ABDOMEN AND PELVIS WITH CONTRAST  TECHNIQUE: Multidetector CT imaging of the abdomen and pelvis was performed using the standard protocol following bolus administration of intravenous contrast.  CONTRAST:  178mL OMNIPAQUE IOHEXOL 300 MG/ML  SOLN  COMPARISON:   None.  FINDINGS: Atelectasis in the lung bases. Calcified granulomas in the left lung base and in the epigastric region.  Mild diffuse fatty infiltration of the liver. Gallbladder, spleen, pancreas, adrenal glands, abdominal aorta, and retroperitoneal lymph nodes are unremarkable. Calcification of abdominal aorta. Aorta iliac graft appears to be in place. No aortic aneurysm. Multiple large cysts throughout the left kidney and less prominently in the right kidney. No hydronephrosis or solid mass identified. Stomach and small bowel are decompressed. Stool-filled colon without distention. No free air or free fluid in the abdomen. Abdominal wall musculature appears intact.  Pelvis: Appendix is normal. Bladder wall is not thickened. Prostate gland appears surgically absent. No free or loculated pelvic fluid collections. No pelvic mass or lymphadenopathy. Diffuse infiltration in the subcutaneous fat of the left coronal and and pubic region, consistent with cellulitis. There is associated skin thickening. No loculated fluid collection to suggest abscess. No soft tissue gas collection to suggest gas gangrene. Degenerative changes throughout the lumbar spine. No destructive bone lesions appreciated.  IMPRESSION: With cellulitis in the left groin region. No abscess or gas collections identified. Bilateral renal cysts. Diffuse fatty infiltration of the liver. Calcified granulomas in the left lung base and epigastric lymph nodes.   Electronically Signed   By: Lucienne Capers M.D.   On: 03/12/2014 01:36    Scheduled Meds: . allopurinol  100 mg Oral Daily  . aspirin EC  325 mg Oral Daily  . aztreonam  1 g Intravenous 3 times per day  . bicalutamide  50 mg Oral Daily  . colchicine  0.6 mg Oral Daily  . feeding supplement (ENSURE COMPLETE)  237 mL Oral Daily  . finasteride  5 mg Oral Daily  . heparin  5,000 Units Subcutaneous 3 times per day  . irbesartan  300 mg Oral Daily   And  . hydrochlorothiazide  12.5 mg Oral  Daily  . metoprolol  25 mg Oral BID  . omega-3 acid ethyl esters  1 g Oral BID  . vancomycin  1,000 mg Intravenous Q12H   Continuous Infusions: . sodium chloride 100 mL/hr at 03/12/14 0401      Time spent: 25 minutes    Cody Moss, [place]  [facility] Pager [phone]. If 7PM-7AM, please contact night-coverage at [email or web address], password [facility] 03/12/2014, 11:58 AM  LOS: 1 day

## 2014-03-12 NOTE — Care Management (Signed)
Utilization review completed.  
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
## 2014-03-12 NOTE — ED Notes (Signed)
Dr. Blaine Hamper is at the bedside

## 2014-03-12 NOTE — Care Management Note (Addendum)
    Page 1 of 2   03/17/2014     3:47:39 PM CARE MANAGEMENT NOTE 03/17/2014  Patient:  Cody Moss, Cody Moss   Account Number:  [id number]  Date Initiated:  03/12/2014  Documentation initiated by:  Tomi Bamberger  Subjective/Objective Assessment:   dx knot in groin area  admit - live with spouse.     Action/Plan:   11/23 for I and D   Anticipated DC Date:  03/17/2014   Anticipated DC Plan:  [place]  CM consult      [facility] Choice  HOME HEALTH   Choice offered to / List presented to:  C-1 Patient        [place] arranged  HH-1 RN      [place].   Status of service:  Completed, signed off Medicare Important Message given?  YES (If response is "NO", the following Medicare IM given date fields will be blank) Date Medicare IM given:  03/15/2014 Medicare IM given by:  Tomi Bamberger Date Additional Medicare IM given:   Additional Medicare IM given by:    Discharge Disposition:  [place]  Per UR Regulation:  Reviewed for med. necessity/level of care/duration of stay  If discussed at [place] of Stay Meetings, dates discussed:    Comments:  03/16/14 1628 Tomi Bamberger RN, BSN [phone] patient is for dc tomorrow, patient chose [facility] for [facility], referral made to [place] notiified.  Soc will begin 24-48 hrs post dc.  03/15/14 [place], BSN [phone] patient  for I and D today, probably dc in 24-48 hrs to home.  NCM  will continue to follow for dc needs.  03/12/14 [place], BSN [phone]  no needs anticipated.

## 2014-03-12 NOTE — Progress Notes (Signed)
Pts CBG 175. Has been elevated all day. On call provider notified. No new orders given.

## 2014-03-12 NOTE — Plan of Care (Signed)
Problem: Phase I Progression Outcomes Goal: Pain controlled with appropriate interventions Outcome: Completed/Met Date Met:  03/12/14     

## 2014-03-12 NOTE — Plan of Care (Signed)
Problem: Phase II Progression Outcomes Goal: Progress activity as tolerated unless otherwise ordered Outcome: Progressing     

## 2014-03-13 DIAGNOSIS — Z9889 Other specified postprocedural states: Secondary | ICD-10-CM

## 2014-03-13 LAB — GLUCOSE, CAPILLARY
Glucose-Capillary: 193 mg/dL — ABNORMAL HIGH (ref 70–99)
Glucose-Capillary: 218 mg/dL — ABNORMAL HIGH (ref 70–99)
Glucose-Capillary: 165 mg/dL — ABNORMAL HIGH (ref 70–99)
Glucose-Capillary: 209 mg/dL — ABNORMAL HIGH (ref 70–99)

## 2014-03-13 MED ORDER — INSULIN ASPART 100 UNIT/ML ~~LOC~~ SOLN
0.0000 [IU] | Freq: Every day | SUBCUTANEOUS | Status: DC
  Administered 2014-03-13: 2 [IU] via SUBCUTANEOUS

## 2014-03-13 MED ORDER — INSULIN ASPART 100 UNIT/ML ~~LOC~~ SOLN
0.0000 [IU] | Freq: Three times a day (TID) | SUBCUTANEOUS | Status: DC
  Administered 2014-03-13: 3 [IU] via SUBCUTANEOUS
  Administered 2014-03-13: 5 [IU] via SUBCUTANEOUS
  Administered 2014-03-14: 2 [IU] via SUBCUTANEOUS
  Administered 2014-03-14: 3 [IU] via SUBCUTANEOUS
  Administered 2014-03-14: 5 [IU] via SUBCUTANEOUS
  Administered 2014-03-15: 2 [IU] via SUBCUTANEOUS
  Administered 2014-03-15 (×2): 3 [IU] via SUBCUTANEOUS
  Administered 2014-03-16: 2 [IU] via SUBCUTANEOUS
  Administered 2014-03-16: 3 [IU] via SUBCUTANEOUS
  Administered 2014-03-16: 5 [IU] via SUBCUTANEOUS
  Administered 2014-03-17: 3 [IU] via SUBCUTANEOUS

## 2014-03-13 MED ORDER — LORAZEPAM 2 MG/ML IJ SOLN
1.0000 mg | Freq: Once | INTRAMUSCULAR | Status: DC
Start: 2014-03-13 — End: 2014-03-13

## 2014-03-13 NOTE — Plan of Care (Signed)
Problem: Phase III Progression Outcomes Goal: Temperature < 100 Outcome: Completed/Met Date Met:  03/13/14

## 2014-03-13 NOTE — Progress Notes (Signed)
[facility] PROGRESS NOTE  KI CORBO BPZ:[id number] DOB: Feb 02, 1941 DOA: 03/11/2014 PCP: [name], MD   Brief narrative 73 y.o. male with past medical history of smoking, coronary artery disease (s/p of stent placement 1996), hypertension, hyperlipidemia, peripheral vascular disease, AAA repair 2002, prostate cancer (s/p of surgery and irradiation therapy 2004), who presents with tenderness over abdominal wall of suprapubic area for past few days with finding of cellulitis over left groin. No abscess or gas seen on CT abdomen.  Assessment/Plan: Abdominal wall cellulitis Region inspected, there is localized induration, did not palpate fluctuance or note purulence. Patient feels that there's been improvement to his cellulitis since yesterday. Will continue empiric IV antibiotic therapy with vancomycin and aztreonam CT scan of abdomen and pelvis performed on 03/12/2014 shows cellulitis in the left groin region without abscess or gas collections identified.  Coronary artery disease History of stent placement in 96. Continue aspirin, metoprolol. Continue fish oil.  Hypertension Blood pressures are stable, last blood pressure 119/86 Continue metoprolol 25 mg by mouth twice a day  Gout Stable. Continue home dose also seen in allopurinol  History of prostate cancer Status post surgery and radiation in 2004. Continue Proscar and bicalutamide.  tobacco abuse counseled on cessation.   Elevated blood glucose Hemoglobin A1c 7.2 Add sliding scale coverage to Accu-Cheks  Diet: Cardiac  DVT prophylaxis: sq heparin   Code Status: Full code Family Communication: wife and daughter at bedside Disposition Plan: home possibly tomorrow if improved   Consultants:  None  Procedures:  CT abd and pelvis  Antibiotics:  IV vanco and aztreonam ( 11/20--)  HPI/Subjective: Patient seen and examined. Has no complaints, reports improvement to  cellulitis  Objective: Filed Vitals:   03/13/14 1335  BP: 119/86  Pulse: 65  Temp:   Resp: 16    Intake/Output Summary (Last 24 hours) at 03/13/14 1647 Last data filed at 03/13/14 1435  Gross per 24 hour  Intake   2945 ml  Output      0 ml  Net   2945 ml   Filed Weights   03/12/14 0407  Weight: 85.412 kg (188 lb 4.8 oz)    Exam:   General:  Elderly male in no acute distress  HEENT: Moist oral mucosa  Chest: Clear to auscultation bilaterally  CVS: Normal S1-S2, no murmurs  Abdomen: Soft, nondistended, nontender, there is erythema and area of induration over growing region, no fluctuant masses palpated, nor did I note purulent drainage  Ext: warm, no edema   Data Reviewed: Basic Metabolic Panel:  Recent Labs Lab 03/11/14 1928 03/12/14 0355  NA 137 134*  K 4.4 4.3  CL 96 96  CO2 25 23  GLUCOSE 126* 254*  BUN 25* 23  CREATININE 1.19 1.13  CALCIUM 9.5 8.8   Liver Function Tests:  Recent Labs Lab 03/11/14 1928 03/12/14 0355  AST 14 8  ALT 10 6  ALKPHOS 56 45  BILITOT 0.5 0.4  PROT 7.8 6.8  ALBUMIN 4.0 3.4*   No results for input(s): LIPASE, AMYLASE in the last 168 hours. No results for input(s): AMMONIA in the last 168 hours. CBC:  Recent Labs Lab 03/11/14 1928 03/12/14 0355  WBC 11.5* 8.2  NEUTROABS 8.0*  --   HGB 12.6* 11.2*  HCT 36.8* 33.8*  MCV 95.1 94.4  PLT 207 192   Cardiac Enzymes: No results for input(s): CKTOTAL, CKMB, CKMBINDEX, TROPONINI in the last 168 hours. BNP (last 3 results) No results for input(s): PROBNP in the last 8760  hours. CBG:  Recent Labs Lab 03/12/14 1200 03/12/14 1653 03/12/14 2119 03/13/14 0742 03/13/14 1147  GLUCAP 161* 200* 175* 193* 218*    Recent Results (from the past 240 hour(s))  Culture, blood (routine x 2)     Status: None (Preliminary result)   Collection Time: 03/12/14  3:48 AM  Result Value Ref Range Status   Specimen Description BLOOD RIGHT FOREARM  Final   Special Requests  BOTTLES DRAWN AEROBIC AND ANAEROBIC 5ML  Final   Culture  Setup Time   Final    03/12/2014 11:25 Performed at [facility]    Culture   Final           BLOOD CULTURE RECEIVED NO GROWTH TO DATE CULTURE WILL BE HELD FOR 5 DAYS BEFORE ISSUING A FINAL NEGATIVE REPORT Performed at [facility]    Report Status PENDING  Incomplete  Culture, blood (routine x 2)     Status: None (Preliminary result)   Collection Time: 03/12/14  3:53 AM  Result Value Ref Range Status   Specimen Description BLOOD LEFT ARM  Final   Special Requests BOTTLES DRAWN AEROBIC ONLY 10CC  Final   Culture  Setup Time   Final    03/12/2014 11:25 Performed at [facility]    Culture   Final           BLOOD CULTURE RECEIVED NO GROWTH TO DATE CULTURE WILL BE HELD FOR 5 DAYS BEFORE ISSUING A FINAL NEGATIVE REPORT Performed at [facility]    Report Status PENDING  Incomplete     Studies: Ct Abdomen Pelvis W Contrast  03/12/2014   CLINICAL DATA:  Cellulitis in the groin. To rule out Fournier's gangrene.  EXAM: CT ABDOMEN AND PELVIS WITH CONTRAST  TECHNIQUE: Multidetector CT imaging of the abdomen and pelvis was performed using the standard protocol following bolus administration of intravenous contrast.  CONTRAST:  126mL OMNIPAQUE IOHEXOL 300 MG/ML  SOLN  COMPARISON:  None.  FINDINGS: Atelectasis in the lung bases. Calcified granulomas in the left lung base and in the epigastric region.  Mild diffuse fatty infiltration of the liver. Gallbladder, spleen, pancreas, adrenal glands, abdominal aorta, and retroperitoneal lymph nodes are unremarkable. Calcification of abdominal aorta. Aorta iliac graft appears to be in place. No aortic aneurysm. Multiple large cysts throughout the left kidney and less prominently in the right kidney. No hydronephrosis or solid mass identified. Stomach and small bowel are decompressed. Stool-filled colon without distention. No free air or free fluid in the abdomen.  Abdominal wall musculature appears intact.  Pelvis: Appendix is normal. Bladder wall is not thickened. Prostate gland appears surgically absent. No free or loculated pelvic fluid collections. No pelvic mass or lymphadenopathy. Diffuse infiltration in the subcutaneous fat of the left coronal and and pubic region, consistent with cellulitis. There is associated skin thickening. No loculated fluid collection to suggest abscess. No soft tissue gas collection to suggest gas gangrene. Degenerative changes throughout the lumbar spine. No destructive bone lesions appreciated.  IMPRESSION: With cellulitis in the left groin region. No abscess or gas collections identified. Bilateral renal cysts. Diffuse fatty infiltration of the liver. Calcified granulomas in the left lung base and epigastric lymph nodes.   Electronically Signed   By: Lucienne Capers M.D.   On: 03/12/2014 01:36    Scheduled Meds: . allopurinol  100 mg Oral Daily  . aspirin EC  325 mg Oral Daily  . aztreonam  1 g Intravenous 3 times per day  .  bicalutamide  50 mg Oral Daily  . colchicine  0.6 mg Oral Daily  . feeding supplement (ENSURE COMPLETE)  237 mL Oral Daily  . finasteride  5 mg Oral Daily  . heparin  5,000 Units Subcutaneous 3 times per day  . irbesartan  300 mg Oral Daily   And  . hydrochlorothiazide  12.5 mg Oral Daily  . insulin aspart  0-15 Units Subcutaneous TID WC  . insulin aspart  0-5 Units Subcutaneous QHS  . metoprolol  25 mg Oral BID  . omega-3 acid ethyl esters  1 g Oral BID  . vancomycin  1,000 mg Intravenous Q12H   Continuous Infusions: . sodium chloride 75 mL/hr at 03/13/14 0504      Time spent: 25 minutes    Kelvin Cellar  [facility] Pager [phone]. If 7PM-7AM, please contact night-coverage at [email or web address], password [facility] 03/13/2014, 4:47 PM  LOS: 2 days

## 2014-03-14 ENCOUNTER — Encounter (HOSPITAL_COMMUNITY): Payer: Self-pay | Admitting: General Surgery

## 2014-03-14 LAB — CBC
HCT: 32.5 % — ABNORMAL LOW (ref 39.0–52.0)
Hemoglobin: 10.8 g/dL — ABNORMAL LOW (ref 13.0–17.0)
MCH: 31.1 pg (ref 26.0–34.0)
MCHC: 33.2 g/dL (ref 30.0–36.0)
MCV: 93.7 fL (ref 78.0–100.0)
Platelets: 223 10*3/uL (ref 150–400)
RBC: 3.47 MIL/uL — ABNORMAL LOW (ref 4.22–5.81)
RDW: 13.3 % (ref 11.5–15.5)
WBC: 6.5 10*3/uL (ref 4.0–10.5)

## 2014-03-14 LAB — BASIC METABOLIC PANEL
Anion gap: 15 (ref 5–15)
BUN: 18 mg/dL (ref 6–23)
CO2: 22 mEq/L (ref 19–32)
Calcium: 9.3 mg/dL (ref 8.4–10.5)
Chloride: 100 mEq/L (ref 96–112)
Creatinine, Ser: 0.99 mg/dL (ref 0.50–1.35)
GFR calc Af Amer: >90 mL/min (ref >90)
GFR calc non Af Amer: 80 mL/min — ABNORMAL LOW (ref >90)
Glucose, Bld: 149 mg/dL — ABNORMAL HIGH (ref 70–99)
Potassium: 4.4 mEq/L (ref 3.7–5.3)
Sodium: 137 mEq/L (ref 137–147)

## 2014-03-14 LAB — SURGICAL PCR SCREEN
MRSA, PCR: NEGATIVE
Staphylococcus aureus: NEGATIVE

## 2014-03-14 LAB — GLUCOSE, CAPILLARY
Glucose-Capillary: 149 mg/dL — ABNORMAL HIGH (ref 70–99)
Glucose-Capillary: 164 mg/dL — ABNORMAL HIGH (ref 70–99)
Glucose-Capillary: 218 mg/dL — ABNORMAL HIGH (ref 70–99)
Glucose-Capillary: 188 mg/dL — ABNORMAL HIGH (ref 70–99)

## 2014-03-14 NOTE — Plan of Care (Signed)
Problem: Phase I Progression Outcomes Goal: Hemodynamically stable Outcome: Completed/Met Date Met:  03/14/14     

## 2014-03-14 NOTE — Plan of Care (Signed)
Problem: Phase II Progression Outcomes Goal: Progress activity as tolerated unless otherwise ordered Outcome: Progressing     

## 2014-03-14 NOTE — Progress Notes (Signed)
[facility] PROGRESS NOTE  Cody Moss WLN:[id number] DOB: 1940-11-26 DOA: 03/11/2014 PCP: [name], MD   Brief narrative 73 y.o. male with past medical history of smoking, coronary artery disease (s/p of stent placement 1996), hypertension, hyperlipidemia, peripheral vascular disease, AAA repair 2002, prostate cancer (s/p of surgery and irradiation therapy 2004), who presents with tenderness over abdominal wall of suprapubic area for past few days with finding of cellulitis over left groin. No abscess or gas seen on CT abdomen.  Assessment/Plan: Abdominal wall cellulitis/Abscess CT scan of abdomen and pelvis performed on 03/12/2014 shows cellulitis in the left groin region without abscess or gas collections identified. Increased purulent drainage noted in the last 24 hours, have consulted General Surgery for consultation regarding possible I&D, meanwhile will continue current antimicrobial regime.   Coronary artery disease No current acute cardiopulmonary issues.  History of stent placement in 96. Continue aspirin, metoprolol. Continue fish oil.  Hypertension Blood pressures are stable Continue metoprolol 25 mg by mouth twice a day  Gout Stable. Continue home dose also seen in allopurinol  History of prostate cancer Status post surgery and radiation in 2004. Continue Proscar and bicalutamide.  tobacco abuse counseled on cessation.   Elevated blood glucose Hemoglobin A1c 7.2 Add sliding scale coverage to Accu-Cheks  Diet: Cardiac  DVT prophylaxis: sq heparin   Code Status: Full code Family Communication: wife and daughter at bedside Disposition Plan: Pt having increased drainage, have consulted surgery to assess for I&D   Consultants:  General Surgery  Procedures:  CT abd and pelvis  Antibiotics:  IV vanco and aztreonam ( 11/20--)  HPI/Subjective: Patient seen and examined. Has no complaints, he reports increased drainage    Objective: Filed Vitals:   03/14/14 0557  BP: 123/62  Pulse: 55  Temp: 98.1 F (36.7 C)  Resp: 16    Intake/Output Summary (Last 24 hours) at 03/14/14 1136 Last data filed at 03/14/14 0953  Gross per 24 hour  Intake 4166.25 ml  Output      0 ml  Net 4166.25 ml   Filed Weights   03/12/14 0407  Weight: 85.412 kg (188 lb 4.8 oz)    Exam:   General:  Elderly male in no acute distress  HEENT: Moist oral mucosa  Chest: Clear to auscultation bilaterally  CVS: Normal S1-S2, no murmurs  Abdomen: Soft, nondistended, nontender, there is increased purulent drainage not present yesterday  Ext: warm, no edema   Data Reviewed: Basic Metabolic Panel:  Recent Labs Lab 03/11/14 1928 03/12/14 0355 03/14/14 0532  NA 137 134* 137  K 4.4 4.3 4.4  CL 96 96 100  CO2 25 23 22   GLUCOSE 126* 254* 149*  BUN 25* 23 18  CREATININE 1.19 1.13 0.99  CALCIUM 9.5 8.8 9.3   Liver Function Tests:  Recent Labs Lab 03/11/14 1928 03/12/14 0355  AST 14 8  ALT 10 6  ALKPHOS 56 45  BILITOT 0.5 0.4  PROT 7.8 6.8  ALBUMIN 4.0 3.4*   No results for input(s): LIPASE, AMYLASE in the last 168 hours. No results for input(s): AMMONIA in the last 168 hours. CBC:  Recent Labs Lab 03/11/14 1928 03/12/14 0355 03/14/14 0532  WBC 11.5* 8.2 6.5  NEUTROABS 8.0*  --   --   HGB 12.6* 11.2* 10.8*  HCT 36.8* 33.8* 32.5*  MCV 95.1 94.4 93.7  PLT 207 192 223   Cardiac Enzymes: No results for input(s): CKTOTAL, CKMB, CKMBINDEX, TROPONINI in the last 168 hours. BNP (last 3 results) No  results for input(s): PROBNP in the last 8760 hours. CBG:  Recent Labs Lab 03/13/14 0742 03/13/14 1147 03/13/14 1700 03/13/14 2113 03/14/14 0736  GLUCAP 193* 218* 165* 209* 149*    Recent Results (from the past 240 hour(s))  Culture, blood (routine x 2)     Status: None (Preliminary result)   Collection Time: 03/12/14  3:48 AM  Result Value Ref Range Status   Specimen Description BLOOD RIGHT  FOREARM  Final   Special Requests BOTTLES DRAWN AEROBIC AND ANAEROBIC 5ML  Final   Culture  Setup Time   Final    03/12/2014 11:25 Performed at [facility]    Culture   Final           BLOOD CULTURE RECEIVED NO GROWTH TO DATE CULTURE WILL BE HELD FOR 5 DAYS BEFORE ISSUING A FINAL NEGATIVE REPORT Performed at [facility]    Report Status PENDING  Incomplete  Culture, blood (routine x 2)     Status: None (Preliminary result)   Collection Time: 03/12/14  3:53 AM  Result Value Ref Range Status   Specimen Description BLOOD LEFT ARM  Final   Special Requests BOTTLES DRAWN AEROBIC ONLY 10CC  Final   Culture  Setup Time   Final    03/12/2014 11:25 Performed at [facility]    Culture   Final           BLOOD CULTURE RECEIVED NO GROWTH TO DATE CULTURE WILL BE HELD FOR 5 DAYS BEFORE ISSUING A FINAL NEGATIVE REPORT Performed at [facility]    Report Status PENDING  Incomplete     Studies: No results found.  Scheduled Meds: . allopurinol  100 mg Oral Daily  . aspirin EC  325 mg Oral Daily  . aztreonam  1 g Intravenous 3 times per day  . bicalutamide  50 mg Oral Daily  . colchicine  0.6 mg Oral Daily  . feeding supplement (ENSURE COMPLETE)  237 mL Oral Daily  . finasteride  5 mg Oral Daily  . heparin  5,000 Units Subcutaneous 3 times per day  . irbesartan  300 mg Oral Daily   And  . hydrochlorothiazide  12.5 mg Oral Daily  . insulin aspart  0-15 Units Subcutaneous TID WC  . insulin aspart  0-5 Units Subcutaneous QHS  . metoprolol  25 mg Oral BID  . omega-3 acid ethyl esters  1 g Oral BID  . vancomycin  1,000 mg Intravenous Q12H   Continuous Infusions: . sodium chloride 75 mL/hr at 03/14/14 0710      Time spent: 25 minutes    Kelvin Cellar  [facility] Pager [phone]. If 7PM-7AM, please contact night-coverage at [email or web address], password [facility] 03/14/2014, 11:36 AM  LOS: 3 days
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
## 2014-03-15 ENCOUNTER — Inpatient Hospital Stay (HOSPITAL_COMMUNITY): Payer: Medicare Other | Admitting: Certified Registered"

## 2014-03-15 ENCOUNTER — Encounter (HOSPITAL_COMMUNITY)
Admission: EM | Disposition: A | Discharge status: Home / Self Care | Payer: Self-pay | Source: Home / Self Care | Attending: Internal Medicine

## 2014-03-15 ENCOUNTER — Encounter (HOSPITAL_COMMUNITY): Payer: Self-pay | Admitting: Certified Registered"

## 2014-03-15 DIAGNOSIS — L02219 Cutaneous abscess of trunk, unspecified: Secondary | ICD-10-CM

## 2014-03-15 HISTORY — PX: IRRIGATION AND DEBRIDEMENT ABSCESS: SHX5252

## 2014-03-15 LAB — GLUCOSE, CAPILLARY
Glucose-Capillary: 191 mg/dL — ABNORMAL HIGH (ref 70–99)
Glucose-Capillary: 182 mg/dL — ABNORMAL HIGH (ref 70–99)
Glucose-Capillary: 137 mg/dL — ABNORMAL HIGH (ref 70–99)
Glucose-Capillary: 159 mg/dL — ABNORMAL HIGH (ref 70–99)
Glucose-Capillary: 142 mg/dL — ABNORMAL HIGH (ref 70–99)

## 2014-03-15 SURGERY — IRRIGATION AND DEBRIDEMENT ABSCESS
Anesthesia: General

## 2014-03-15 MED ORDER — PROPOFOL 10 MG/ML IV BOLUS
INTRAVENOUS | Status: AC
Start: 2014-03-15 — End: 2014-03-15
  Filled 2014-03-15: qty 20

## 2014-03-15 MED ORDER — ONDANSETRON HCL 4 MG/2ML IJ SOLN
INTRAMUSCULAR | Status: DC | PRN
  Administered 2014-03-15: 4 mg via INTRAVENOUS

## 2014-03-15 MED ORDER — 0.9 % SODIUM CHLORIDE (POUR BTL) OPTIME
TOPICAL | Status: DC | PRN
  Administered 2014-03-15: 1000 mL

## 2014-03-15 MED ORDER — FENTANYL CITRATE 0.05 MG/ML IJ SOLN
INTRAMUSCULAR | Status: AC
  Filled 2014-03-15: qty 2

## 2014-03-15 MED ORDER — FENTANYL CITRATE 0.05 MG/ML IJ SOLN
INTRAMUSCULAR | Status: AC
  Filled 2014-03-15: qty 5

## 2014-03-15 MED ORDER — OXYCODONE HCL 5 MG/5ML PO SOLN: 5.0000 mg | Freq: Once | ORAL | Status: AC | PRN

## 2014-03-15 MED ORDER — OXYCODONE HCL 5 MG PO TABS
5.0000 mg | ORAL_TABLET | Freq: Once | ORAL | Status: AC | PRN
  Administered 2014-03-15: 5 mg via ORAL

## 2014-03-15 MED ORDER — OXYCODONE HCL 5 MG PO TABS
ORAL_TABLET | ORAL | Status: AC
  Filled 2014-03-15: qty 1

## 2014-03-15 MED ORDER — PROPOFOL 10 MG/ML IV BOLUS
INTRAVENOUS | Status: DC | PRN
  Administered 2014-03-15: 200 mg via INTRAVENOUS

## 2014-03-15 MED ORDER — FENTANYL CITRATE 0.05 MG/ML IJ SOLN
25.0000 ug | INTRAMUSCULAR | Status: DC | PRN
  Administered 2014-03-15 (×2): 25 ug via INTRAVENOUS

## 2014-03-15 MED ORDER — FENTANYL CITRATE 0.05 MG/ML IJ SOLN
INTRAMUSCULAR | Status: DC | PRN
  Administered 2014-03-15: 50 ug via INTRAVENOUS

## 2014-03-15 MED ORDER — LACTATED RINGERS IV SOLN
INTRAVENOUS | Status: DC | PRN
  Administered 2014-03-15: 08:00:00 via INTRAVENOUS

## 2014-03-15 MED ORDER — LIDOCAINE HCL (CARDIAC) 20 MG/ML IV SOLN
INTRAVENOUS | Status: DC | PRN
  Administered 2014-03-15: 40 mg via INTRAVENOUS

## 2014-03-15 SURGICAL SUPPLY — 31 items
BNDG GAUZE ELAST 4 BULKY (GAUZE/BANDAGES/DRESSINGS) IMPLANT
CANISTER SUCTION 2500CC (MISCELLANEOUS) ×3 IMPLANT
COVER SURGICAL LIGHT HANDLE (MISCELLANEOUS) ×3 IMPLANT
DRAPE LAPAROSCOPIC ABDOMINAL (DRAPES) IMPLANT
DRAPE PED LAPAROTOMY (DRAPES) ×2 IMPLANT
DRAPE UTILITY 15X26 W/TAPE STR (DRAPE) ×12 IMPLANT
DRSG PAD ABDOMINAL 8X10 ST (GAUZE/BANDAGES/DRESSINGS) IMPLANT
ELECT CAUTERY BLADE 6.4 (BLADE) ×3 IMPLANT
ELECT REM PT RETURN 9FT ADLT (ELECTROSURGICAL) ×3
ELECTRODE REM PT RTRN 9FT ADLT (ELECTROSURGICAL) ×1 IMPLANT
GAUZE IODOFORM PACK 1/2 7832 (GAUZE/BANDAGES/DRESSINGS) ×2 IMPLANT
GAUZE SPONGE 4X4 12PLY STRL (GAUZE/BANDAGES/DRESSINGS) IMPLANT
GLOVE BIO SURGEON STRL SZ7.5 (GLOVE) ×3 IMPLANT
GLOVE BIOGEL PI IND STRL 8 (GLOVE) ×1 IMPLANT
GLOVE BIOGEL PI INDICATOR 8 (GLOVE) ×2
GOWN STRL REUS W/ TWL LRG LVL3 (GOWN DISPOSABLE) ×2 IMPLANT
GOWN STRL REUS W/ TWL XL LVL3 (GOWN DISPOSABLE) ×1 IMPLANT
GOWN STRL REUS W/TWL LRG LVL3 (GOWN DISPOSABLE) ×6
GOWN STRL REUS W/TWL XL LVL3 (GOWN DISPOSABLE) ×3
KIT BASIN OR (CUSTOM PROCEDURE TRAY) ×3 IMPLANT
KIT ROOM TURNOVER OR (KITS) ×3 IMPLANT
NS IRRIG 1000ML POUR BTL (IV SOLUTION) ×3 IMPLANT
PACK GENERAL/GYN (CUSTOM PROCEDURE TRAY) ×3 IMPLANT
PAD ABD 8X10 STRL (GAUZE/BANDAGES/DRESSINGS) ×2 IMPLANT
PAD ARMBOARD 7.5X6 YLW CONV (MISCELLANEOUS) ×3 IMPLANT
SPONGE GAUZE 4X4 12PLY STER LF (GAUZE/BANDAGES/DRESSINGS) ×2 IMPLANT
SWAB COLLECTION DEVICE MRSA (MISCELLANEOUS) ×2 IMPLANT
TAPE CLOTH SURG 6X10 WHT LF (GAUZE/BANDAGES/DRESSINGS) ×2 IMPLANT
TOWEL OR 17X24 6PK STRL BLUE (TOWEL DISPOSABLE) ×3 IMPLANT
TOWEL OR 17X26 10 PK STRL BLUE (TOWEL DISPOSABLE) ×3 IMPLANT
TUBE ANAEROBIC SPECIMEN COL (MISCELLANEOUS) ×2 IMPLANT

## 2014-03-15 NOTE — Transfer of Care (Signed)
Immediate Anesthesia Transfer of Care Note  Patient: Cody Moss  Procedure(s) Performed: Procedure(s): IRRIGATION AND DEBRIDEMENT SUPRAPUBIC ABSCESS (N/A)  Patient Location: PACU  Anesthesia Type:General  Level of Consciousness: awake, alert  and oriented  Airway & Oxygen Therapy: Patient Spontanous Breathing  Post-op Assessment: Report given to PACU RN and Post -op Vital signs reviewed and stable  Post vital signs: Reviewed and stable  Complications: No apparent anesthesia complications

## 2014-03-15 NOTE — Progress Notes (Signed)
ANTIBIOTIC CONSULT NOTE - INITIAL  Pharmacy Consult for aztreonam Indication: cellulitis  Allergies  Allergen Reactions  . Statins Other (See Comments)    MUSCLE CRAMPS  . Penicillins Rash    Patient Measurements: Height: 5\' 11"  (180.3 cm) Weight: 188 lb 4.8 oz (85.412 kg) IBW/kg (Calculated) : 75.3  Vital Signs: Temp: 97.5 F (36.4 C) (11/23 1327) Temp Source: Oral (11/23 1327) BP: 107/56 mmHg (11/23 1327) Pulse Rate: 53 (11/23 1327)  Labs:  Recent Labs  03/14/14 0532  WBC 6.5  HGB 10.8*  PLT 223  CREATININE 0.99   Estimated Creatinine Clearance: 72.9 mL/min (by C-G formula based on Cr of 0.99).  Medical History: Past Medical History  Diagnosis Date  . Hypertension   . S/P AAA repair 2002  . S/P angioplasty with stent   . Prostate cancer 05/25/2002    prostatectomy  . Nocturia   . Phimosis   . Hyperlipidemia   . Cataract immature   . Peripheral vascular disease S/P AAA AND AORTOBI-ILIAC BYPASS  . Coronary artery disease CARDIOLOGIST - DR LITTLE - LAST VISIT 05-30-2011-- WILL REQUEST NOTE, ECHO AND STRESS TEST    DENIES S & S  . Hx of radiation therapy 09/13/03 - 11/05/03    pelvis/prostate bed, Dr Cristela Felt  . Diabetes mellitus     "prediabetic", no meds    Medications:  Prescriptions prior to admission  Medication Sig Dispense Refill Last Dose  . allopurinol (ZYLOPRIM) 100 MG tablet Take 100 mg by mouth daily.   12 03/11/2014 at Unknown time  . aspirin EC 325 MG tablet Take 325 mg by mouth daily.   03/11/2014 at Unknown time  . bicalutamide (CASODEX) 50 MG tablet Take 50 mg by mouth daily.    03/11/2014 at Unknown time  . colchicine 0.6 MG tablet Take 1 tablet (0.6 mg total) by mouth daily. 30 tablet 0 unknown  . finasteride (PROSCAR) 5 MG tablet Take 5 mg by mouth daily.    03/11/2014 at Unknown time  . fish oil-omega-3 fatty acids 1000 MG capsule Take 1,200 g by mouth 2 (two) times daily.   03/11/2014 at Unknown time  . metoprolol (LOPRESSOR) 50 MG  tablet Take 25 mg by mouth 2 (two) times daily.    03/11/2014 at 0800  . olmesartan-hydrochlorothiazide (BENICAR HCT) 40-12.5 MG per tablet Take 1 tablet by mouth daily.   03/11/2014 at Unknown time  . pantoprazole (PROTONIX) 40 MG tablet Take 40 mg by mouth as needed (gerd, acid reflux).    03/11/2014 at Unknown time  . ranitidine (ZANTAC) 75 MG tablet Take 75 mg by mouth at bedtime.    03/10/2014 at Unknown time  . HYDROcodone-acetaminophen (NORCO/VICODIN) 5-325 MG per tablet Take 1 tablet by mouth every 6 (six) hours as needed. 10 tablet 0 Taking  . predniSONE (DELTASONE) 10 MG tablet as directed.   Taking   Scheduled:  . allopurinol  100 mg Oral Daily  . aspirin EC  325 mg Oral Daily  . aztreonam  1 g Intravenous 3 times per day  . bicalutamide  50 mg Oral Daily  . colchicine  0.6 mg Oral Daily  . feeding supplement (ENSURE COMPLETE)  237 mL Oral Daily  . fentaNYL      . finasteride  5 mg Oral Daily  . heparin  5,000 Units Subcutaneous 3 times per day  . irbesartan  300 mg Oral Daily   And  . hydrochlorothiazide  12.5 mg Oral Daily  . insulin aspart  0-15 Units Subcutaneous TID WC  . insulin aspart  0-5 Units Subcutaneous QHS  . metoprolol  25 mg Oral BID  . omega-3 acid ethyl esters  1 g Oral BID  . oxyCODONE      . vancomycin  1,000 mg Intravenous Q12H    Assessment: 73yo male c/o progressive skin infection w/ redness and swelling at pubis and penis, no abscess or gas on CT, remains on antibiotics for suprapubic cellulitis.  Today is day #4 of vancomycin and aztreonam. Underwent I&D of area today. Scr  <1, CBC stable and wnl.   Plan:  -Aztreonam 1 g IV q8h  -Vancomycin 1 g IV q12h -Monitor cultures, renal fx, duration of therapy -Narrow therapy as appropriate  Hughes Better, PharmD, BCPS Clinical Pharmacist Pager: [phone] 03/15/2014 1:40 PM

## 2014-03-15 NOTE — Progress Notes (Signed)
Bright red bleeding noted to suprapubic bandage site. Bandage  Removed and direct pressure applied to the site at 4 o'clock from the umbilicus. MD notified.

## 2014-03-15 NOTE — Anesthesia Procedure Notes (Signed)
Procedure Name: LMA Insertion Date/Time: 03/15/2014 8:19 AM Performed by: Manuela Schwartz B Pre-anesthesia Checklist: Patient identified, Emergency Drugs available, Suction available, Patient being monitored and Timeout performed Patient Re-evaluated:Patient Re-evaluated prior to inductionOxygen Delivery Method: Circle system utilized Preoxygenation: Pre-oxygenation with 100% oxygen Intubation Type: IV induction LMA: LMA inserted LMA Size: 4.0 Number of attempts: 1 Placement Confirmation: positive ETCO2 and breath sounds checked- equal and bilateral Tube secured with: Tape Dental Injury: Teeth and Oropharynx as per pre-operative assessment

## 2014-03-15 NOTE — Anesthesia Postprocedure Evaluation (Signed)
  Anesthesia Post-op Note  Patient: Cody Moss  Procedure(s) Performed: Procedure(s): IRRIGATION AND DEBRIDEMENT SUPRAPUBIC ABSCESS (N/A)  Patient Location: PACU  Anesthesia Type:General  Level of Consciousness: awake and alert   Airway and Oxygen Therapy: Patient Spontanous Breathing  Post-op Pain: mild  Post-op Assessment: Post-op Vital signs reviewed, Patient's Cardiovascular Status Stable, Respiratory Function Stable, Patent Airway, No signs of Nausea or vomiting and Pain level controlled  Post-op Vital Signs: Reviewed and stable  Last Vitals:  Filed Vitals:   03/15/14 1058  BP: 140/64  Pulse: 60  Temp:   Resp:     Complications: No apparent anesthesia complications

## 2014-03-15 NOTE — Op Note (Signed)
05/15/2013  8:32 AM  PATIENT:  Cody Moss  73 y.o. male  PRE-OPERATIVE DIAGNOSIS:  suprapubic abscess  POST-OPERATIVE DIAGNOSIS:  suprapubic abscess  PROCEDURE:  Procedure(s): IRRIGATION AND DEBRIDEMENT SUPRAPUBIC ABSCESS (N/A)  SURGEON:  Surgeon(s) and Role:    * Ralene Ok, MD - Primary   ANESTHESIA:   general  EBL:   5cc  BLOOD ADMINISTERED:none  DRAINS: Half-inch iodoform gauze   LOCAL MEDICATIONS USED:  NONE  SPECIMEN:  Source of Specimen:  Suprapubic abscess cultures  DISPOSITION OF SPECIMEN:  micro  COUNTS:  YES  TOURNIQUET:  * No tourniquets in log *  DICTATION: .Dragon Dictation The patient was taken back to the operating room and placed in the supine position with bilateral SCDs in place after patient was consented. Patient was prepped and draped in usual sterile fashion. A timeout was called and all facts were verified.  A 2 cm incision was made over the area of greatest fluctuance. Blunt dissection was taken down to the pus pocket. A large amount of purulence was expressed. Both anaerobic and aerobic cultures were taken. This area of purulence tracked lateral to the midline. A second incision was made just superficial to this pus pocket. This connected to the first incision area. This time the area was irrigated out with sterile saline. The wound was hemostatic. Half-inch iodoform gauze was then packed into the wound from medial to lateral. There are 2 tails at each incision site. The area was covered with 4 x 4's, AVD pad, and tape. The patient how the procedure well. He was taken to the recovery room in stable condition.      PLAN OF CARE: admitted  PATIENT DISPOSITION:  PACU - hemodynamically stable.   Delay start of Pharmacological VTE agent (>24hrs) due to surgical blood loss or risk of bleeding: no

## 2014-03-15 NOTE — Progress Notes (Signed)
[facility] PROGRESS NOTE  Cody Moss VOH:[id number] DOB: 1940/08/05 DOA: 03/11/2014 PCP: [name], MD   Brief narrative 73 y.o. male with past medical history of smoking, coronary artery disease (s/p of stent placement 1996), hypertension, hyperlipidemia, peripheral vascular disease, AAA repair 2002, prostate cancer (s/p of surgery and irradiation therapy 2004), who presents with tenderness over abdominal wall of suprapubic area for past few days with finding of cellulitis over left groin. No abscess or gas seen on CT abdomen.  Assessment/Plan: Abdominal wall cellulitis/Abscess CT scan of abdomen and pelvis performed on 03/12/2014 shows cellulitis in the left groin region without abscess or gas collections identified. Due to increase purulence surgery was consulted. S/P I&D of suprapubic abscess performed on 03/15/2014, patient tolerated procedure. Large amount of purulence was expressed. Pending OR cultures Continue IV Vancomycin and Aztreonam  Coronary artery disease No current acute cardiopulmonary issues.  History of stent placement in 96. Continue aspirin, metoprolol. Continue fish oil.  Hypertension Blood pressures are stable Continue metoprolol 25 mg by mouth twice a day  Gout Stable. Continue home dose also seen in allopurinol  History of prostate cancer Status post surgery and radiation in 2004. Continue Proscar and bicalutamide.  tobacco abuse counseled on cessation.   Elevated blood glucose Hemoglobin A1c 7.2 Add sliding scale coverage to Accu-Cheks  Diet: Cardiac  DVT prophylaxis: sq heparin   Code Status: Full code Family Communication: Spoke with his wife who was present at bedside Disposition Plan: S/P I&D   Consultants:  General Surgery  Procedures:  CT abd and pelvis  Antibiotics:  IV vanco and aztreonam ( 11/20--)  HPI/Subjective: Patient seen postop. He reports doing well, although did have increased bleeding from I&D  site, RN dressed wound, bleeding controlled.    Objective: Filed Vitals:   03/15/14 1058  BP: 140/64  Pulse: 60  Temp:   Resp:     Intake/Output Summary (Last 24 hours) at 03/15/14 1230 Last data filed at 03/15/14 6948  Gross per 24 hour  Intake   2372 ml  Output      0 ml  Net   2372 ml   Filed Weights   03/12/14 0407  Weight: 85.412 kg (188 lb 4.8 oz)    Exam:   General:  Elderly male in no acute distress  HEENT: Moist oral mucosa  Chest: Clear to auscultation bilaterally  CVS: Normal S1-S2, no murmurs  Abdomen: Soft, nondistended, nontender, s/p I&D of suprapubic abscess.  Ext: warm, no edema   Data Reviewed: Basic Metabolic Panel:  Recent Labs Lab 03/11/14 1928 03/12/14 0355 03/14/14 0532  NA 137 134* 137  K 4.4 4.3 4.4  CL 96 96 100  CO2 25 23 22   GLUCOSE 126* 254* 149*  BUN 25* 23 18  CREATININE 1.19 1.13 0.99  CALCIUM 9.5 8.8 9.3   Liver Function Tests:  Recent Labs Lab 03/11/14 1928 03/12/14 0355  AST 14 8  ALT 10 6  ALKPHOS 56 45  BILITOT 0.5 0.4  PROT 7.8 6.8  ALBUMIN 4.0 3.4*   No results for input(s): LIPASE, AMYLASE in the last 168 hours. No results for input(s): AMMONIA in the last 168 hours. CBC:  Recent Labs Lab 03/11/14 1928 03/12/14 0355 03/14/14 0532  WBC 11.5* 8.2 6.5  NEUTROABS 8.0*  --   --   HGB 12.6* 11.2* 10.8*  HCT 36.8* 33.8* 32.5*  MCV 95.1 94.4 93.7  PLT 207 192 223   Cardiac Enzymes: No results for input(s): CKTOTAL, CKMB, CKMBINDEX, TROPONINI  in the last 168 hours. BNP (last 3 results) No results for input(s): PROBNP in the last 8760 hours. CBG:  Recent Labs Lab 03/14/14 1657 03/14/14 2206 03/15/14 0702 03/15/14 0844 03/15/14 1150  GLUCAP 218* 188* 191* 182* 137*    Recent Results (from the past 240 hour(s))  Culture, blood (routine x 2)     Status: None (Preliminary result)   Collection Time: 03/12/14  3:48 AM  Result Value Ref Range Status   Specimen Description BLOOD RIGHT  FOREARM  Final   Special Requests BOTTLES DRAWN AEROBIC AND ANAEROBIC 5ML  Final   Culture  Setup Time   Final    03/12/2014 11:25 Performed at [facility]    Culture   Final           BLOOD CULTURE RECEIVED NO GROWTH TO DATE CULTURE WILL BE HELD FOR 5 DAYS BEFORE ISSUING A FINAL NEGATIVE REPORT Performed at [facility]    Report Status PENDING  Incomplete  Culture, blood (routine x 2)     Status: None (Preliminary result)   Collection Time: 03/12/14  3:53 AM  Result Value Ref Range Status   Specimen Description BLOOD LEFT ARM  Final   Special Requests BOTTLES DRAWN AEROBIC ONLY 10CC  Final   Culture  Setup Time   Final    03/12/2014 11:25 Performed at [facility]    Culture   Final           BLOOD CULTURE RECEIVED NO GROWTH TO DATE CULTURE WILL BE HELD FOR 5 DAYS BEFORE ISSUING A FINAL NEGATIVE REPORT Performed at [facility]    Report Status PENDING  Incomplete  Surgical pcr screen     Status: None   Collection Time: 03/14/14  2:30 PM  Result Value Ref Range Status   MRSA, PCR NEGATIVE NEGATIVE Final   Staphylococcus aureus NEGATIVE NEGATIVE Final    Comment:        The Xpert SA Assay (FDA approved for NASAL specimens in patients over 69 years of age), is one component of a comprehensive surveillance program.  Test performance has been validated by [employer] for patients greater than or equal to 54 year old. It is not intended to diagnose infection nor to guide or monitor treatment.      Studies: No results found.  Scheduled Meds: . allopurinol  100 mg Oral Daily  . aspirin EC  325 mg Oral Daily  . aztreonam  1 g Intravenous 3 times per day  . bicalutamide  50 mg Oral Daily  . colchicine  0.6 mg Oral Daily  . feeding supplement (ENSURE COMPLETE)  237 mL Oral Daily  . fentaNYL      . finasteride  5 mg Oral Daily  . heparin  5,000 Units Subcutaneous 3 times per day  . irbesartan  300 mg Oral Daily   And  .  hydrochlorothiazide  12.5 mg Oral Daily  . insulin aspart  0-15 Units Subcutaneous TID WC  . insulin aspart  0-5 Units Subcutaneous QHS  . metoprolol  25 mg Oral BID  . omega-3 acid ethyl esters  1 g Oral BID  . oxyCODONE      . vancomycin  1,000 mg Intravenous Q12H   Continuous Infusions:      Time spent: 25 minutes    Kelvin Cellar  [facility] Pager [phone]. If 7PM-7AM, please contact night-coverage at [email or web address], password [facility] 03/15/2014, 12:30 PM  LOS: 4 days

## 2014-03-15 NOTE — Anesthesia Preprocedure Evaluation (Addendum)
Anesthesia Evaluation  Patient identified by MRN, date of birth, ID band Patient awake    Reviewed: Allergy & Precautions, H&P , NPO status , Patient's Chart, lab work & pertinent test results, reviewed documented beta blocker date and time   History of Anesthesia Complications Negative for: history of anesthetic complications  Airway Mallampati: I  TM Distance: >3 FB Neck ROM: Full    Dental  (+) Edentulous Upper, Edentulous Lower   Pulmonary Current Smoker,          Cardiovascular hypertension, Pt. on medications and Pt. on home beta blockers - angina+ CAD, + Cardiac Stents and + Peripheral Vascular Disease Rhythm:Regular     Neuro/Psych negative neurological ROS  negative psych ROS   GI/Hepatic Neg liver ROS, GERD-  Medicated and Controlled,  Endo/Other  diabetes, Type 2, Oral Hypoglycemic Agents  Renal/GU negative Renal ROS     Musculoskeletal   Abdominal   Peds  Hematology  (+) anemia ,   Anesthesia Other Findings   Reproductive/Obstetrics                           Anesthesia Physical Anesthesia Plan  ASA: II  Anesthesia Plan: General   Post-op Pain Management:    Induction: Intravenous  Airway Management Planned: LMA  Additional Equipment: None  Intra-op Plan:   Post-operative Plan:   Informed Consent: I have reviewed the patients History and Physical, chart, labs and discussed the procedure including the risks, benefits and alternatives for the proposed anesthesia with the patient or authorized representative who has indicated his/her understanding and acceptance.   Dental advisory given  Plan Discussed with: Surgeon and CRNA  Anesthesia Plan Comments:        Anesthesia Quick Evaluation

## 2014-03-16 ENCOUNTER — Encounter (HOSPITAL_COMMUNITY): Payer: Self-pay | Admitting: General Surgery

## 2014-03-16 LAB — GLUCOSE, CAPILLARY
Glucose-Capillary: 184 mg/dL — ABNORMAL HIGH (ref 70–99)
Glucose-Capillary: 135 mg/dL — ABNORMAL HIGH (ref 70–99)
Glucose-Capillary: 204 mg/dL — ABNORMAL HIGH (ref 70–99)
Glucose-Capillary: 136 mg/dL — ABNORMAL HIGH (ref 70–99)

## 2014-03-16 LAB — VANCOMYCIN, TROUGH: Vancomycin Tr: 17.7 ug/mL (ref 10.0–20.0)

## 2014-03-16 MED ORDER — BICALUTAMIDE 50 MG PO TABS
50.0000 mg | ORAL_TABLET | Freq: Every day | ORAL | Status: DC
  Administered 2014-03-16: 50 mg via ORAL
  Filled 2014-03-16 (×2): qty 1

## 2014-03-16 MED ORDER — MORPHINE SULFATE 2 MG/ML IJ SOLN
1.0000 mg | INTRAMUSCULAR | Status: DC | PRN
  Administered 2014-03-17: 2 mg via INTRAVENOUS
  Filled 2014-03-16 (×2): qty 1

## 2014-03-16 MED ORDER — VANCOMYCIN HCL IN DEXTROSE 750-5 MG/150ML-% IV SOLN
750.0000 mg | Freq: Two times a day (BID) | INTRAVENOUS | Status: DC
  Administered 2014-03-16 – 2014-03-17 (×2): 750 mg via INTRAVENOUS
  Filled 2014-03-16 (×3): qty 150

## 2014-03-16 MED ORDER — FINASTERIDE 5 MG PO TABS
5.0000 mg | ORAL_TABLET | Freq: Every day | ORAL | Status: DC
  Administered 2014-03-16: 5 mg via ORAL
  Filled 2014-03-16 (×3): qty 1

## 2014-03-16 NOTE — Progress Notes (Signed)
[facility] Progress Note  1 Day Post-Op  Subjective: Pt doing well, pain well controlled.  Tolerating diet.  Ambulating well.  +urinating, but no BM yet.  Wife at bedside.  Objective: Vital signs in last 24 hours: Temp:  [97.5 F (36.4 C)-98 F (36.7 C)] 97.7 F (36.5 C) (11/24 0510) Pulse Rate:  [52-60] 52 (11/23 2144) Resp:  [16-20] 20 (11/24 0510) BP: (107-140)/(56-64) 121/58 mmHg (11/24 0510) SpO2:  [92 %-97 %] 94 % (11/24 0510) Last BM Date: 03/14/14  Intake/Output from previous day: 11/23 0701 - 11/24 0700 In: 1660 [P.O.:960; I.V.:400; IV Piggyback:300] Out: 1750 [Urine:1750] Intake/Output this shift: Total I/O In: -  Out: 620 [Urine:620]  PE: Gen:  Alert, NAD, pleasant Groin:    Lab Results:   Recent Labs  03/14/14 0532  WBC 6.5  HGB 10.8*  HCT 32.5*  PLT 223   BMET  Recent Labs  03/14/14 0532  NA 137  K 4.4  CL 100  CO2 22  GLUCOSE 149*  BUN 18  CREATININE 0.99  CALCIUM 9.3   PT/INR No results for input(s): LABPROT, INR in the last 72 hours. CMP     Component Value Date/Time   NA 137 03/14/2014 0532   K 4.4 03/14/2014 0532   CL 100 03/14/2014 0532   CO2 22 03/14/2014 0532   GLUCOSE 149* 03/14/2014 0532   BUN 18 03/14/2014 0532   CREATININE 0.99 03/14/2014 0532   CREATININE 1.17 03/18/2013 1307   CALCIUM 9.3 03/14/2014 0532   PROT 6.8 03/12/2014 0355   ALBUMIN 3.4* 03/12/2014 0355   AST 8 03/12/2014 0355   ALT 6 03/12/2014 0355   ALKPHOS 45 03/12/2014 0355   BILITOT 0.4 03/12/2014 0355   GFRNONAA 80* 03/14/2014 0532   GFRAA >90 03/14/2014 0532   Lipase  No results found for: LIPASE     Studies/Results: No results found.  Anti-infectives: Anti-infectives    Start     Dose/Rate Route Frequency Ordered Stop   03/16/14 1800  vancomycin (VANCOCIN) IVPB 750 mg/150 ml premix     750 mg150 mL/hr over 60 Minutes Intravenous Every 12 hours 03/16/14 0852     03/12/14 1800  vancomycin (VANCOCIN) IVPB 1000 mg/200 mL  premix  Status:  Discontinued     1,000 mg200 mL/hr over 60 Minutes Intravenous Every 12 hours 03/12/14 1029 03/16/14 0852   03/12/14 0500  aztreonam (AZACTAM) 1 g in dextrose 5 % 50 mL IVPB     1 g100 mL/hr over 30 Minutes Intravenous 3 times per day 03/12/14 0431     03/12/14 0300  vancomycin (VANCOCIN) 1,250 mg in sodium chloride 0.9 % 250 mL IVPB     1,250 mg166.7 mL/hr over 90 Minutes Intravenous  Once 03/12/14 0248 03/12/14 0530   03/12/14 0300  cefTRIAXone (ROCEPHIN) 1 g in dextrose 5 % 50 mL IVPB  Status:  Discontinued     1 g100 mL/hr over 30 Minutes Intravenous  Once 03/12/14 0248 03/12/14 0332       Assessment/Plan Suprapubic abscess POD #1 s/p I&D  Plan: 1.  Dressing changes BID with 1/4 iodoform gauze packing (packing goes in midline wound and should be advanced to left lateral wound. 2.  Ambulate and IS 3.  SCD's and  4.  IV vancomycin and aztreonam (d/c'ed) 5.  Likely d/c tomorrow if dressing changes go well 6.  May need HH at discharge for dressing changes if family is not able to help    LOS: 5 days  Coralie Keens 03/16/2014, 9:56 AM Pager: [phone]

## 2014-03-16 NOTE — Progress Notes (Signed)
[facility] PROGRESS NOTE  Cody Moss SAY:[id number] DOB: 09-29-1940 DOA: 03/11/2014 PCP: [name], MD  Interim Summary Patient is a pleasant 73 year old gentleman with a past medical history of coronary artery disease, hypertension, dyslipidemia who was admitted to the medicine service on 03/12/2014 presenting with cellulitis over suprapubic region. He was started on empiric IV antimicrobial therapy with vancomycin and aztreonam. Initial CT scan of pelvis showed Sal[place]'s within the left groin region without abscess or gas collections identified. On 03/14/2014 he had increased purulent drainage concerning for the development of abscess. Gen. surgery was consulted. On 03/15/2014 he was taken to the OR where he underwent irrigation and debridement of suprapubic abscess, procedure performed by Dr. Rosendo Gros of general surgery. Cultures obtained in the OR groin moderate Staphylococcus aureus, susceptibility testing pending at the time of this dictation. His antibiotics were narrowed, discontinuing aztreonam on 03/16/2014 and continuing vancomycin.                                                                                                                                                                      Assessment/Plan: 1. Suprapubic cellulitis/abscess -Patient was taken to the operating room on 03/15/2014 undergoing irrigation and debridement of suprapubic abscess. -Cultures obtained in the operating room grow Staphylococcus aureus, susceptibility testing is pending at this time -Will narrow antimicrobial regimen, stop aztreonam continue vancomycin until susceptibility testing available -Gen. surgery recommending dressing changes twice a day -Anticipate discharge on 03/16/2014 if remains stable  2.  Hypertension -Blood pressure stable, having last blood pressure 124/69 -Will continue metoprolol 25 mg by mouth twice a day  3.  Diabetes mellitus -Hemoglobin A1c 7.2 -Previously  diet controlled -Blood sugars improved with sliding scale coverage  4.  Coronary artery disease -Patient I have acute cardiopulmonary issues -Continue aspirin and beta blocker  Code Status: Full code Family Communication: Patient's wife updated Disposition Plan: Anticipate discharge in the next 24 hours if remains stable, follow-up on wound cultures   Consultants:  General surgery  Procedures:  Irrigation and debridement of suprapubic abscess, procedure performed on 03/15/2014 by Dr. Rosendo Gros  Antibiotics:  Vancomycin started on 03/12/2014  Aztreonam started on 03/12/2014, discontinued on 03/16/2014  HPI/Subjective: Patient states doing well, denies nausea vomiting, fevers or chills, tolerating by mouth intake  Objective: Filed Vitals:   03/16/14 1339  BP: 124/69  Pulse: 54  Temp: 97.7 F (36.5 C)  Resp: 16    Intake/Output Summary (Last 24 hours) at 03/16/14 1513 Last data filed at 03/16/14 0856  Gross per 24 hour  Intake   1020 ml  Output   2370 ml  Net  -1350 ml   Filed Weights   03/12/14 0407  Weight: 85.412 kg (188 lb 4.8 oz)    Exam:  General:  Awake, alert, no acute distress  Cardiovascular: Regular rate and rhythm normal S1-S2  Respiratory: Normal respiratory ref or, lungs are clear to auscultation  Abdomen: Soft nontender nondistended  Musculoskeletal: No edema  Skin: Surgical incision site packed, appears clean, no evidence of purulent discharge or bleeding, erythema decreasing  Data Reviewed: Basic Metabolic Panel:  Recent Labs Lab 03/11/14 1928 03/12/14 0355 03/14/14 0532  NA 137 134* 137  K 4.4 4.3 4.4  CL 96 96 100  CO2 25 23 22   GLUCOSE 126* 254* 149*  BUN 25* 23 18  CREATININE 1.19 1.13 0.99  CALCIUM 9.5 8.8 9.3   Liver Function Tests:  Recent Labs Lab 03/11/14 1928 03/12/14 0355  AST 14 8  ALT 10 6  ALKPHOS 56 45  BILITOT 0.5 0.4  PROT 7.8 6.8  ALBUMIN 4.0 3.4*   No results for input(s): LIPASE, AMYLASE  in the last 168 hours. No results for input(s): AMMONIA in the last 168 hours. CBC:  Recent Labs Lab 03/11/14 1928 03/12/14 0355 03/14/14 0532  WBC 11.5* 8.2 6.5  NEUTROABS 8.0*  --   --   HGB 12.6* 11.2* 10.8*  HCT 36.8* 33.8* 32.5*  MCV 95.1 94.4 93.7  PLT 207 192 223   Cardiac Enzymes: No results for input(s): CKTOTAL, CKMB, CKMBINDEX, TROPONINI in the last 168 hours. BNP (last 3 results) No results for input(s): PROBNP in the last 8760 hours. CBG:  Recent Labs Lab 03/15/14 1150 03/15/14 1641 03/15/14 2159 03/16/14 0748 03/16/14 1232  GLUCAP 137* 159* 142* 184* 135*    Recent Results (from the past 240 hour(s))  Culture, blood (routine x 2)     Status: None (Preliminary result)   Collection Time: 03/12/14  3:48 AM  Result Value Ref Range Status   Specimen Description BLOOD RIGHT FOREARM  Final   Special Requests BOTTLES DRAWN AEROBIC AND ANAEROBIC 5ML  Final   Culture  Setup Time   Final    03/12/2014 11:25 Performed at [facility]    Culture   Final           BLOOD CULTURE RECEIVED NO GROWTH TO DATE CULTURE WILL BE HELD FOR 5 DAYS BEFORE ISSUING A FINAL NEGATIVE REPORT Performed at [facility]    Report Status PENDING  Incomplete  Culture, blood (routine x 2)     Status: None (Preliminary result)   Collection Time: 03/12/14  3:53 AM  Result Value Ref Range Status   Specimen Description BLOOD LEFT ARM  Final   Special Requests BOTTLES DRAWN AEROBIC ONLY 10CC  Final   Culture  Setup Time   Final    03/12/2014 11:25 Performed at [facility]    Culture   Final           BLOOD CULTURE RECEIVED NO GROWTH TO DATE CULTURE WILL BE HELD FOR 5 DAYS BEFORE ISSUING A FINAL NEGATIVE REPORT Performed at [facility]    Report Status PENDING  Incomplete  Surgical pcr screen     Status: None   Collection Time: 03/14/14  2:30 PM  Result Value Ref Range Status   MRSA, PCR NEGATIVE NEGATIVE Final   Staphylococcus aureus NEGATIVE  NEGATIVE Final    Comment:        The Xpert SA Assay (FDA approved for NASAL specimens in patients over 26 years of age), is one component of a comprehensive surveillance program.  Test performance has been validated by [employer] for patients greater than or  equal to 16 year old. It is not intended to diagnose infection nor to guide or monitor treatment.   Culture, routine-abscess     Status: None (Preliminary result)   Collection Time: 03/15/14  8:39 AM  Result Value Ref Range Status   Specimen Description ABSCESS OTHER  Final   Special Requests SUPRAPUBIC ABSCESS  Final   Gram Stain   Final    MODERATE WBC PRESENT,BOTH PMN AND MONONUCLEAR RARE SQUAMOUS EPITHELIAL CELLS PRESENT FEW GRAM POSITIVE COCCI IN PAIRS IN CLUSTERS Performed at [facility]    Culture   Final    MODERATE STAPHYLOCOCCUS AUREUS Note: RIFAMPIN AND GENTAMICIN SHOULD NOT BE USED AS SINGLE DRUGS FOR TREATMENT OF STAPH INFECTIONS. Performed at [facility]    Report Status PENDING  Incomplete  Anaerobic culture     Status: None (Preliminary result)   Collection Time: 03/15/14  8:39 AM  Result Value Ref Range Status   Specimen Description ABSCESS OTHER  Final   Special Requests SUPRAPUBIC ABSCESS  Final   Gram Stain PENDING  Incomplete   Culture   Final    NO ANAEROBES ISOLATED; CULTURE IN PROGRESS FOR 5 DAYS Performed at [facility]    Report Status PENDING  Incomplete     Studies: No results found.  Scheduled Meds: . allopurinol  100 mg Oral Daily  . aspirin EC  325 mg Oral Daily  . bicalutamide  50 mg Oral Daily  . colchicine  0.6 mg Oral Daily  . feeding supplement (ENSURE COMPLETE)  237 mL Oral Daily  . finasteride  5 mg Oral Daily  . heparin  5,000 Units Subcutaneous 3 times per day  . irbesartan  300 mg Oral Daily   And  . hydrochlorothiazide  12.5 mg Oral Daily  . insulin aspart  0-15 Units Subcutaneous TID WC  . insulin aspart  0-5 Units Subcutaneous QHS   . metoprolol  25 mg Oral BID  . omega-3 acid ethyl esters  1 g Oral BID  . vancomycin  750 mg Intravenous Q12H   Continuous Infusions:   Principal Problem:   Cellulitis of abdominal wall Active Problems:   HTN (hypertension)   Gout   Hyperlipidemia   Tobacco dependence   S/P AAA repair   S/P angioplasty with stent   Peripheral vascular disease   Coronary artery disease   Cellulitis   Sepsis due to cellulitis    Time spent: 25 min    Kelvin Cellar  [facility] Pager [phone]. If 7PM-7AM, please contact night-coverage at [email or web address], password [facility] 03/16/2014, 3:13 PM  LOS: 5 days

## 2014-03-16 NOTE — Progress Notes (Signed)
ANTIBIOTIC CONSULT NOTE - INITIAL  Assessment: S/p I&D of suprapubic abscess on 11/23, culture from abscess is growing staph aureus, blood cultures remain ngtd. Today is day #5 of vancomycin and aztreonam. Vancomycin trough returned supratherapeutic at 17.7.   Goal of Therapy:  Vancomycin trough level 10-15 mcg/ml  Plan:  -Monitor cultures, watch sensitivities  -Reduce vancomycin to 750/12h -Deescalate abx as able  Harvel Quale 03/16/2014,8:44 AM

## 2014-03-17 LAB — GLUCOSE, CAPILLARY
Glucose-Capillary: 182 mg/dL — ABNORMAL HIGH (ref 70–99)
Glucose-Capillary: 213 mg/dL — ABNORMAL HIGH (ref 70–99)

## 2014-03-17 LAB — BASIC METABOLIC PANEL
Anion gap: 12 (ref 5–15)
BUN: 27 mg/dL — ABNORMAL HIGH (ref 6–23)
CO2: 25 mEq/L (ref 19–32)
Calcium: 9.4 mg/dL (ref 8.4–10.5)
Chloride: 97 mEq/L (ref 96–112)
Creatinine, Ser: 1.21 mg/dL (ref 0.50–1.35)
GFR calc Af Amer: 68 mL/min — ABNORMAL LOW (ref >90)
GFR calc non Af Amer: 58 mL/min — ABNORMAL LOW (ref >90)
Glucose, Bld: 148 mg/dL — ABNORMAL HIGH (ref 70–99)
Potassium: 4.8 mEq/L (ref 3.7–5.3)
Sodium: 134 mEq/L — ABNORMAL LOW (ref 137–147)

## 2014-03-17 LAB — CULTURE, ROUTINE-ABSCESS

## 2014-03-17 MED ORDER — CHLORHEXIDINE GLUCONATE CLOTH 2 % EX PADS
6.0000 | MEDICATED_PAD | Freq: Every day | CUTANEOUS | Status: DC
  Administered 2014-03-17: 6 via TOPICAL

## 2014-03-17 MED ORDER — DOXYCYCLINE HYCLATE 100 MG PO CAPS: 100.0000 mg | ORAL_CAPSULE | Freq: Two times a day (BID) | ORAL | Status: DC

## 2014-03-17 MED ORDER — MUPIROCIN 2 % EX OINT
1.0000 "application " | TOPICAL_OINTMENT | Freq: Two times a day (BID) | CUTANEOUS | Status: DC
  Administered 2014-03-17: 1 via NASAL
  Filled 2014-03-17: qty 22

## 2014-03-17 MED ORDER — HYDROCODONE-ACETAMINOPHEN 5-325 MG PO TABS: 1.0000 | ORAL_TABLET | Freq: Four times a day (QID) | ORAL | Status: DC | PRN
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
## 2014-03-17 NOTE — Progress Notes (Signed)
Discharge instructions given. Pt verbalized understanding and all questions were answered.  

## 2014-03-17 NOTE — Discharge Instructions (Signed)
Follow with Primary MD SHAW,KIMBERLEE, MD in 7 days , Keep your suprapubic infection site clean and dry at all times  Get CBC, CMP, 2 view Chest X ray checked  by Primary MD next visit.    Activity: As tolerated with Full fall precautions use walker/cane & assistance as needed   Disposition Home     Diet: Heart Healthy Low Carb.  For Heart failure patients - Check your Weight same time everyday, if you gain over 2 pounds, or you develop in leg swelling, experience more shortness of breath or chest pain, call your Primary MD immediately. Follow Cardiac Low Salt Diet and 1.8 lit/day fluid restriction.   On your next visit with your primary care physician please Get Medicines reviewed and adjusted.   Please request your Prim.MD to go over all Hospital Tests and Procedure/Radiological results at the follow up, please get all Hospital records sent to your Prim MD by signing hospital release before you go home.   If you experience worsening of your admission symptoms, develop shortness of breath, life threatening emergency, suicidal or homicidal thoughts you must seek medical attention immediately by calling 911 or calling your MD immediately  if symptoms less severe.  You Must read complete instructions/literature along with all the possible adverse reactions/side effects for all the Medicines you take and that have been prescribed to you. Take any new Medicines after you have completely understood and accpet all the possible adverse reactions/side effects.   Do not drive, operating heavy machinery, perform activities at heights, swimming or participation in water activities or provide baby sitting services if your were admitted for syncope or siezures until you have seen by Primary MD or a Neurologist and advised to do so again.  Do not drive when taking Pain medications.    Do not take more than prescribed Pain, Sleep and Anxiety Medications  Special Instructions: If you have smoked or  chewed Tobacco  in the last 2 yrs please stop smoking, stop any regular Alcohol  and or any Recreational drug use.  Wear Seat belts while driving.   Please note  You were cared for by a hospitalist during your hospital stay. If you have any questions about your discharge medications or the care you received while you were in the hospital after you are discharged, you can call the unit and asked to speak with the hospitalist on call if the hospitalist that took care of you is not available. Once you are discharged, your primary care physician will handle any further medical issues. Please note that NO REFILLS for any discharge medications will be authorized once you are discharged, as it is imperative that you return to your primary care physician (or establish a relationship with a primary care physician if you do not have one) for your aftercare needs so that they can reassess your need for medications and monitor your lab values.

## 2014-03-18 LAB — CULTURE, BLOOD (ROUTINE X 2)
Culture: NO GROWTH
Culture: NO GROWTH

## 2014-03-20 LAB — ANAEROBIC CULTURE

## 2014-10-04 ENCOUNTER — Other Ambulatory Visit: Payer: Self-pay | Admitting: Urology

## 2014-10-04 DIAGNOSIS — C61 Malignant neoplasm of prostate: Secondary | ICD-10-CM

## 2014-10-18 ENCOUNTER — Other Ambulatory Visit: Payer: Self-pay

## 2014-10-22 ENCOUNTER — Encounter: Payer: Self-pay | Admitting: Internal Medicine

## 2014-10-29 ENCOUNTER — Encounter (HOSPITAL_COMMUNITY)
Admission: RE | Admit: 2014-10-29 | Discharge: 2014-10-29 | Disposition: A | Discharge status: Home / Self Care | Payer: Medicare Other | Source: Ambulatory Visit | Attending: Urology | Admitting: Urology

## 2014-10-29 DIAGNOSIS — C61 Malignant neoplasm of prostate: Secondary | ICD-10-CM | POA: Diagnosis present

## 2014-10-29 DIAGNOSIS — N1330 Unspecified hydronephrosis: Secondary | ICD-10-CM | POA: Insufficient documentation

## 2014-10-29 MED ORDER — TECHNETIUM TC 99M MEDRONATE IV KIT
27.5000 | PACK | Freq: Once | INTRAVENOUS | Status: AC | PRN
  Administered 2014-10-29: 27.5 via INTRAVENOUS

## 2015-02-24 ENCOUNTER — Other Ambulatory Visit: Payer: Self-pay | Admitting: Internal Medicine

## 2015-02-24 DIAGNOSIS — Z8679 Personal history of other diseases of the circulatory system: Secondary | ICD-10-CM

## 2015-02-24 DIAGNOSIS — Z9889 Other specified postprocedural states: Principal | ICD-10-CM

## 2015-03-04 ENCOUNTER — Ambulatory Visit (HOSPITAL_COMMUNITY)
Admission: RE | Admit: 2015-03-04 | Discharge: 2015-03-04 | Disposition: A | Discharge status: Home / Self Care | Payer: Medicare Other | Source: Ambulatory Visit | Attending: Cardiology | Admitting: Cardiology

## 2015-03-04 DIAGNOSIS — I10 Essential (primary) hypertension: Secondary | ICD-10-CM | POA: Diagnosis not present

## 2015-03-04 DIAGNOSIS — R7303 Prediabetes: Secondary | ICD-10-CM | POA: Insufficient documentation

## 2015-03-04 DIAGNOSIS — Z9889 Other specified postprocedural states: Secondary | ICD-10-CM | POA: Diagnosis not present

## 2015-03-04 DIAGNOSIS — Z8679 Personal history of other diseases of the circulatory system: Secondary | ICD-10-CM | POA: Insufficient documentation

## 2015-03-04 DIAGNOSIS — E785 Hyperlipidemia, unspecified: Secondary | ICD-10-CM | POA: Insufficient documentation

## 2015-05-27 ENCOUNTER — Encounter (HOSPITAL_COMMUNITY): Payer: Self-pay | Admitting: Family Medicine

## 2015-05-27 ENCOUNTER — Emergency Department (HOSPITAL_COMMUNITY)
Admission: EM | Admit: 2015-05-27 | Discharge: 2015-05-27 | Disposition: A | Discharge status: Home / Self Care | Payer: Medicare Other | Attending: Emergency Medicine | Admitting: Emergency Medicine

## 2015-05-27 ENCOUNTER — Emergency Department (HOSPITAL_COMMUNITY): Payer: Medicare Other

## 2015-05-27 DIAGNOSIS — Z8669 Personal history of other diseases of the nervous system and sense organs: Secondary | ICD-10-CM | POA: Insufficient documentation

## 2015-05-27 DIAGNOSIS — F17210 Nicotine dependence, cigarettes, uncomplicated: Secondary | ICD-10-CM | POA: Insufficient documentation

## 2015-05-27 DIAGNOSIS — Z87438 Personal history of other diseases of male genital organs: Secondary | ICD-10-CM | POA: Diagnosis not present

## 2015-05-27 DIAGNOSIS — Z9861 Coronary angioplasty status: Secondary | ICD-10-CM | POA: Insufficient documentation

## 2015-05-27 DIAGNOSIS — I2510 Atherosclerotic heart disease of native coronary artery without angina pectoris: Secondary | ICD-10-CM | POA: Insufficient documentation

## 2015-05-27 DIAGNOSIS — I10 Essential (primary) hypertension: Secondary | ICD-10-CM | POA: Diagnosis not present

## 2015-05-27 DIAGNOSIS — Z880 Allergy status to penicillin: Secondary | ICD-10-CM | POA: Insufficient documentation

## 2015-05-27 DIAGNOSIS — Z79899 Other long term (current) drug therapy: Secondary | ICD-10-CM | POA: Diagnosis not present

## 2015-05-27 DIAGNOSIS — Z8546 Personal history of malignant neoplasm of prostate: Secondary | ICD-10-CM | POA: Insufficient documentation

## 2015-05-27 DIAGNOSIS — M62838 Other muscle spasm: Secondary | ICD-10-CM

## 2015-05-27 DIAGNOSIS — Z7982 Long term (current) use of aspirin: Secondary | ICD-10-CM | POA: Insufficient documentation

## 2015-05-27 DIAGNOSIS — Z7952 Long term (current) use of systemic steroids: Secondary | ICD-10-CM | POA: Insufficient documentation

## 2015-05-27 DIAGNOSIS — Z7984 Long term (current) use of oral hypoglycemic drugs: Secondary | ICD-10-CM | POA: Insufficient documentation

## 2015-05-27 DIAGNOSIS — M62830 Muscle spasm of back: Secondary | ICD-10-CM | POA: Diagnosis not present

## 2015-05-27 DIAGNOSIS — E785 Hyperlipidemia, unspecified: Secondary | ICD-10-CM | POA: Insufficient documentation

## 2015-05-27 DIAGNOSIS — M25552 Pain in left hip: Secondary | ICD-10-CM | POA: Diagnosis present

## 2015-05-27 MED ORDER — DIAZEPAM 2 MG PO TABS: 2.0000 mg | ORAL_TABLET | Freq: Three times a day (TID) | ORAL | Status: DC | PRN

## 2015-05-27 MED ORDER — DIAZEPAM 2 MG PO TABS
2.0000 mg | ORAL_TABLET | Freq: Once | ORAL | Status: AC
  Administered 2015-05-27: 2 mg via ORAL
  Filled 2015-05-27: qty 1

## 2015-05-27 MED ORDER — KETOROLAC TROMETHAMINE 15 MG/ML IJ SOLN
10.0000 mg | Freq: Once | INTRAMUSCULAR | Status: AC
  Administered 2015-05-27: 10 mg via INTRAVENOUS
  Filled 2015-05-27: qty 1

## 2015-05-27 MED ORDER — OXYCODONE HCL 5 MG PO TABS: 5.0000 mg | ORAL_TABLET | ORAL | Status: DC | PRN

## 2015-05-27 MED ORDER — KETOROLAC TROMETHAMINE 30 MG/ML IJ SOLN
15.0000 mg | Freq: Once | INTRAMUSCULAR | Status: DC
  Filled 2015-05-27: qty 1

## 2015-05-27 MED ORDER — HYDROMORPHONE HCL 1 MG/ML IJ SOLN
1.0000 mg | Freq: Once | INTRAMUSCULAR | Status: AC
  Administered 2015-05-27: 1 mg via INTRAVENOUS

## 2015-05-27 MED ORDER — HYDROMORPHONE HCL 1 MG/ML IJ SOLN
1.0000 mg | Freq: Once | INTRAMUSCULAR | Status: DC
  Filled 2015-05-27: qty 1

## 2015-05-27 NOTE — ED Notes (Signed)
Pt here for left hip pain and back pain x a few days. Denies injury.
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
## 2015-05-31 ENCOUNTER — Other Ambulatory Visit: Payer: Self-pay | Admitting: Family Medicine

## 2015-05-31 DIAGNOSIS — M5432 Sciatica, left side: Secondary | ICD-10-CM

## 2015-06-05 ENCOUNTER — Ambulatory Visit
Admission: RE | Admit: 2015-06-05 | Discharge: 2015-06-05 | Disposition: A | Discharge status: Home / Self Care | Payer: Medicare Other | Source: Ambulatory Visit | Attending: Family Medicine | Admitting: Family Medicine

## 2015-06-05 DIAGNOSIS — M5432 Sciatica, left side: Secondary | ICD-10-CM

## 2015-07-01 ENCOUNTER — Telehealth: Payer: Self-pay | Admitting: Internal Medicine

## 2015-07-01 NOTE — Telephone Encounter (Signed)
1. Type of surgery: L5S1 microdiskectomy for lumbar herniated disc 2. Date of surgery: Tuesday July 19, 2015 3. Surgeon: Dr. Earnie Larsson 4. Medications that need to be held & how long: none requested - patient takes asa 325 QD 5. Fax and/or Phone: (p) [phone] (ext: [phone] for Jeralyn Bennett, surgical scheduler)     (f) [phone]  Message routed to Dr. Debara Pickett

## 2015-07-06 NOTE — Telephone Encounter (Signed)
Patient has MD OV on 07/13/2015

## 2015-07-06 NOTE — Telephone Encounter (Signed)
Overdue for follow-up, not seen since 2015.  Will need appointment for cardiac clearance.  Dr. Lemmie Evens

## 2015-07-13 ENCOUNTER — Ambulatory Visit (INDEPENDENT_AMBULATORY_CARE_PROVIDER_SITE_OTHER): Payer: Medicare Other | Admitting: Internal Medicine

## 2015-07-13 ENCOUNTER — Encounter: Payer: Self-pay | Admitting: Internal Medicine

## 2015-07-13 VITALS — BP 106/68 | HR 60 | Ht 71.0 in | Wt 198.2 lb

## 2015-07-13 DIAGNOSIS — Z01810 Encounter for preprocedural cardiovascular examination: Secondary | ICD-10-CM

## 2015-07-13 DIAGNOSIS — I10 Essential (primary) hypertension: Secondary | ICD-10-CM

## 2015-07-13 DIAGNOSIS — I2510 Atherosclerotic heart disease of native coronary artery without angina pectoris: Secondary | ICD-10-CM | POA: Diagnosis not present

## 2015-07-13 DIAGNOSIS — Z9889 Other specified postprocedural states: Secondary | ICD-10-CM

## 2015-07-13 DIAGNOSIS — I739 Peripheral vascular disease, unspecified: Secondary | ICD-10-CM | POA: Diagnosis not present

## 2015-07-13 DIAGNOSIS — Z8679 Personal history of other diseases of the circulatory system: Secondary | ICD-10-CM
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
## 2015-07-13 NOTE — Patient Instructions (Signed)
Your physician wants you to follow-up in: 1 year with Dr. Hilty. You will receive a reminder letter in the mail two months in advance. If you don't receive a letter, please call our office to schedule the follow-up appointment.  

## 2015-07-15 ENCOUNTER — Telehealth: Payer: Self-pay | Admitting: *Deleted

## 2015-07-15 NOTE — Telephone Encounter (Signed)
LEFT MESSAGE - PATIENT HAS BEEN CLEARED FOR SURGERY   OFFICE VISIT  07/13/15 ROUTED TO  DR POOL'S OFFICE

## 2015-07-19 HISTORY — PX: LUMBAR DISC SURGERY: SHX700

## 2015-07-20 ENCOUNTER — Ambulatory Visit: Payer: Medicare Other | Admitting: Internal Medicine

## 2015-07-23 ENCOUNTER — Emergency Department (HOSPITAL_COMMUNITY): Payer: Medicare Other

## 2015-07-23 ENCOUNTER — Encounter (HOSPITAL_COMMUNITY): Payer: Self-pay | Admitting: *Deleted

## 2015-07-23 ENCOUNTER — Inpatient Hospital Stay (HOSPITAL_COMMUNITY)
Admission: EM | Admit: 2015-07-23 | Discharge: 2015-07-28 | DRG: 682 | Disposition: A | Discharge status: Home Health | Payer: Medicare Other | Attending: Internal Medicine | Admitting: Internal Medicine

## 2015-07-23 DIAGNOSIS — Z9079 Acquired absence of other genital organ(s): Secondary | ICD-10-CM

## 2015-07-23 DIAGNOSIS — Z91048 Other nonmedicinal substance allergy status: Secondary | ICD-10-CM

## 2015-07-23 DIAGNOSIS — D638 Anemia in other chronic diseases classified elsewhere: Secondary | ICD-10-CM | POA: Diagnosis present

## 2015-07-23 DIAGNOSIS — I959 Hypotension, unspecified: Secondary | ICD-10-CM | POA: Diagnosis present

## 2015-07-23 DIAGNOSIS — W19XXXA Unspecified fall, initial encounter: Secondary | ICD-10-CM | POA: Diagnosis present

## 2015-07-23 DIAGNOSIS — E785 Hyperlipidemia, unspecified: Secondary | ICD-10-CM | POA: Diagnosis present

## 2015-07-23 DIAGNOSIS — K5900 Constipation, unspecified: Secondary | ICD-10-CM | POA: Diagnosis present

## 2015-07-23 DIAGNOSIS — N179 Acute kidney failure, unspecified: Secondary | ICD-10-CM | POA: Diagnosis not present

## 2015-07-23 DIAGNOSIS — R410 Disorientation, unspecified: Secondary | ICD-10-CM

## 2015-07-23 DIAGNOSIS — Z923 Personal history of irradiation: Secondary | ICD-10-CM

## 2015-07-23 DIAGNOSIS — T400X5A Adverse effect of opium, initial encounter: Secondary | ICD-10-CM | POA: Diagnosis present

## 2015-07-23 DIAGNOSIS — Z888 Allergy status to other drugs, medicaments and biological substances status: Secondary | ICD-10-CM

## 2015-07-23 DIAGNOSIS — M545 Low back pain, unspecified: Secondary | ICD-10-CM

## 2015-07-23 DIAGNOSIS — I2510 Atherosclerotic heart disease of native coronary artery without angina pectoris: Secondary | ICD-10-CM | POA: Diagnosis present

## 2015-07-23 DIAGNOSIS — E1122 Type 2 diabetes mellitus with diabetic chronic kidney disease: Secondary | ICD-10-CM | POA: Diagnosis present

## 2015-07-23 DIAGNOSIS — Z7984 Long term (current) use of oral hypoglycemic drugs: Secondary | ICD-10-CM

## 2015-07-23 DIAGNOSIS — T4275XA Adverse effect of unspecified antiepileptic and sedative-hypnotic drugs, initial encounter: Secondary | ICD-10-CM | POA: Diagnosis present

## 2015-07-23 DIAGNOSIS — R509 Fever, unspecified: Secondary | ICD-10-CM

## 2015-07-23 DIAGNOSIS — Z8679 Personal history of other diseases of the circulatory system: Secondary | ICD-10-CM

## 2015-07-23 DIAGNOSIS — R52 Pain, unspecified: Secondary | ICD-10-CM

## 2015-07-23 DIAGNOSIS — R29898 Other symptoms and signs involving the musculoskeletal system: Secondary | ICD-10-CM

## 2015-07-23 DIAGNOSIS — I129 Hypertensive chronic kidney disease with stage 1 through stage 4 chronic kidney disease, or unspecified chronic kidney disease: Secondary | ICD-10-CM | POA: Diagnosis present

## 2015-07-23 DIAGNOSIS — Z809 Family history of malignant neoplasm, unspecified: Secondary | ICD-10-CM

## 2015-07-23 DIAGNOSIS — M109 Gout, unspecified: Secondary | ICD-10-CM | POA: Diagnosis present

## 2015-07-23 DIAGNOSIS — G9340 Encephalopathy, unspecified: Secondary | ICD-10-CM | POA: Diagnosis present

## 2015-07-23 DIAGNOSIS — Z880 Allergy status to penicillin: Secondary | ICD-10-CM

## 2015-07-23 DIAGNOSIS — I10 Essential (primary) hypertension: Secondary | ICD-10-CM | POA: Diagnosis present

## 2015-07-23 DIAGNOSIS — Z9889 Other specified postprocedural states: Secondary | ICD-10-CM

## 2015-07-23 DIAGNOSIS — R7989 Other specified abnormal findings of blood chemistry: Secondary | ICD-10-CM

## 2015-07-23 DIAGNOSIS — R609 Edema, unspecified: Secondary | ICD-10-CM

## 2015-07-23 DIAGNOSIS — G92 Toxic encephalopathy: Secondary | ICD-10-CM | POA: Diagnosis present

## 2015-07-23 DIAGNOSIS — E1151 Type 2 diabetes mellitus with diabetic peripheral angiopathy without gangrene: Secondary | ICD-10-CM | POA: Diagnosis present

## 2015-07-23 DIAGNOSIS — Z79899 Other long term (current) drug therapy: Secondary | ICD-10-CM

## 2015-07-23 DIAGNOSIS — R404 Transient alteration of awareness: Secondary | ICD-10-CM

## 2015-07-23 DIAGNOSIS — C61 Malignant neoplasm of prostate: Secondary | ICD-10-CM | POA: Diagnosis present

## 2015-07-23 DIAGNOSIS — R531 Weakness: Secondary | ICD-10-CM | POA: Diagnosis not present

## 2015-07-23 DIAGNOSIS — Z955 Presence of coronary angioplasty implant and graft: Secondary | ICD-10-CM

## 2015-07-23 DIAGNOSIS — I739 Peripheral vascular disease, unspecified: Secondary | ICD-10-CM

## 2015-07-23 DIAGNOSIS — T402X5A Adverse effect of other opioids, initial encounter: Secondary | ICD-10-CM | POA: Diagnosis present

## 2015-07-23 DIAGNOSIS — R0902 Hypoxemia: Secondary | ICD-10-CM

## 2015-07-23 DIAGNOSIS — E871 Hypo-osmolality and hyponatremia: Secondary | ICD-10-CM | POA: Diagnosis present

## 2015-07-23 DIAGNOSIS — D509 Iron deficiency anemia, unspecified: Secondary | ICD-10-CM | POA: Diagnosis present

## 2015-07-23 DIAGNOSIS — Z95820 Peripheral vascular angioplasty status with implants and grafts: Secondary | ICD-10-CM

## 2015-07-23 DIAGNOSIS — Z95828 Presence of other vascular implants and grafts: Secondary | ICD-10-CM

## 2015-07-23 DIAGNOSIS — N182 Chronic kidney disease, stage 2 (mild): Secondary | ICD-10-CM | POA: Diagnosis present

## 2015-07-23 DIAGNOSIS — Z7982 Long term (current) use of aspirin: Secondary | ICD-10-CM

## 2015-07-23 DIAGNOSIS — R296 Repeated falls: Secondary | ICD-10-CM | POA: Diagnosis present

## 2015-07-23 DIAGNOSIS — F17210 Nicotine dependence, cigarettes, uncomplicated: Secondary | ICD-10-CM | POA: Diagnosis present

## 2015-07-23 DIAGNOSIS — Z9181 History of falling: Secondary | ICD-10-CM

## 2015-07-23 DIAGNOSIS — M549 Dorsalgia, unspecified: Secondary | ICD-10-CM

## 2015-07-23 HISTORY — DX: Acute kidney failure, unspecified: N17.9

## 2015-07-23 LAB — HEPATIC FUNCTION PANEL
ALT: 15 U/L — ABNORMAL LOW (ref 17–63)
AST: 20 U/L (ref 15–41)
Albumin: 3.4 g/dL — ABNORMAL LOW (ref 3.5–5.0)
Alkaline Phosphatase: 28 U/L — ABNORMAL LOW (ref 38–126)
Bilirubin, Direct: 0.1 mg/dL (ref 0.1–0.5)
Indirect Bilirubin: 0.7 mg/dL (ref 0.3–0.9)
Total Bilirubin: 0.8 mg/dL (ref 0.3–1.2)
Total Protein: 7.5 g/dL (ref 6.5–8.1)

## 2015-07-23 LAB — CBC
HCT: 31 % — ABNORMAL LOW (ref 39.0–52.0)
Hemoglobin: 10.7 g/dL — ABNORMAL LOW (ref 13.0–17.0)
MCH: 32.2 pg (ref 26.0–34.0)
MCHC: 34.5 g/dL (ref 30.0–36.0)
MCV: 93.4 fL (ref 78.0–100.0)
Platelets: 247 10*3/uL (ref 150–400)
RBC: 3.32 MIL/uL — ABNORMAL LOW (ref 4.22–5.81)
RDW: 13.7 % (ref 11.5–15.5)
WBC: 8.8 10*3/uL (ref 4.0–10.5)

## 2015-07-23 LAB — BASIC METABOLIC PANEL
Anion gap: 13 (ref 5–15)
BUN: 24 mg/dL — ABNORMAL HIGH (ref 6–20)
CO2: 22 mmol/L (ref 22–32)
Calcium: 9.5 mg/dL (ref 8.9–10.3)
Chloride: 96 mmol/L — ABNORMAL LOW (ref 101–111)
Creatinine, Ser: 1.78 mg/dL — ABNORMAL HIGH (ref 0.61–1.24)
GFR calc Af Amer: 42 mL/min — ABNORMAL LOW (ref >60)
GFR calc non Af Amer: 36 mL/min — ABNORMAL LOW (ref >60)
Glucose, Bld: 173 mg/dL — ABNORMAL HIGH (ref 65–99)
Potassium: 4.4 mmol/L (ref 3.5–5.1)
Sodium: 131 mmol/L — ABNORMAL LOW (ref 135–145)

## 2015-07-23 LAB — CBG MONITORING, ED: Glucose-Capillary: 169 mg/dL — ABNORMAL HIGH (ref 65–99)

## 2015-07-23 LAB — I-STAT CG4 LACTIC ACID, ED: Lactic Acid, Venous: 1.85 mmol/L (ref 0.5–2.0)

## 2015-07-23 LAB — AMMONIA: Ammonia: 14 umol/L (ref 9–35)

## 2015-07-23 MED ORDER — SODIUM CHLORIDE 0.9 % IV BOLUS (SEPSIS)
1000.0000 mL | Freq: Once | INTRAVENOUS | Status: AC
  Administered 2015-07-23: 1000 mL via INTRAVENOUS

## 2015-07-23 MED ORDER — MORPHINE SULFATE (PF) 2 MG/ML IV SOLN
2.0000 mg | Freq: Once | INTRAVENOUS | Status: AC
  Administered 2015-07-23: 2 mg via INTRAVENOUS
  Filled 2015-07-23: qty 1

## 2015-07-23 MED ORDER — FENTANYL CITRATE (PF) 100 MCG/2ML IJ SOLN
50.0000 ug | Freq: Once | INTRAMUSCULAR | Status: AC
  Administered 2015-07-23: 50 ug via INTRAVENOUS
  Filled 2015-07-23: qty 2

## 2015-07-23 MED ORDER — IOPAMIDOL (ISOVUE-370) INJECTION 76%
INTRAVENOUS | Status: AC
  Administered 2015-07-23: 100 mL
  Filled 2015-07-23: qty 100

## 2015-07-23 NOTE — ED Notes (Signed)
MD at bedside. 

## 2015-07-23 NOTE — ED Notes (Signed)
Spoke to lab.  Will add Hepatic Function.

## 2015-07-23 NOTE — ED Notes (Signed)
Patient transported to CT 
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
## 2015-07-23 NOTE — ED Notes (Signed)
MD at bedside updating patient/family

## 2015-07-23 NOTE — ED Notes (Signed)
PT had lower lumbar back surgery on Tuesday and wife states they had switch his pain medicines. Pt is not getting better since.  Pt has had slurred speech since yesterday and he fell out of bed this afternoon and said his legs would not hold him.  PT reports eating and drinking.  Pt is pale at triage and gets dizzy when he stands up.

## 2015-07-23 NOTE — ED Notes (Signed)
Pt made aware of need for urine, will attempt when coming back from CT

## 2015-07-24 ENCOUNTER — Inpatient Hospital Stay (HOSPITAL_COMMUNITY): Payer: Medicare Other

## 2015-07-24 DIAGNOSIS — Z91048 Other nonmedicinal substance allergy status: Secondary | ICD-10-CM | POA: Diagnosis not present

## 2015-07-24 DIAGNOSIS — Z79899 Other long term (current) drug therapy: Secondary | ICD-10-CM | POA: Diagnosis not present

## 2015-07-24 DIAGNOSIS — E1151 Type 2 diabetes mellitus with diabetic peripheral angiopathy without gangrene: Secondary | ICD-10-CM | POA: Diagnosis present

## 2015-07-24 DIAGNOSIS — Z9079 Acquired absence of other genital organ(s): Secondary | ICD-10-CM | POA: Diagnosis not present

## 2015-07-24 DIAGNOSIS — Z809 Family history of malignant neoplasm, unspecified: Secondary | ICD-10-CM | POA: Diagnosis not present

## 2015-07-24 DIAGNOSIS — Z95828 Presence of other vascular implants and grafts: Secondary | ICD-10-CM | POA: Diagnosis not present

## 2015-07-24 DIAGNOSIS — W19XXXA Unspecified fall, initial encounter: Secondary | ICD-10-CM | POA: Diagnosis not present

## 2015-07-24 DIAGNOSIS — I10 Essential (primary) hypertension: Secondary | ICD-10-CM | POA: Diagnosis not present

## 2015-07-24 DIAGNOSIS — M109 Gout, unspecified: Secondary | ICD-10-CM | POA: Diagnosis present

## 2015-07-24 DIAGNOSIS — Z955 Presence of coronary angioplasty implant and graft: Secondary | ICD-10-CM | POA: Diagnosis not present

## 2015-07-24 DIAGNOSIS — G92 Toxic encephalopathy: Secondary | ICD-10-CM | POA: Diagnosis present

## 2015-07-24 DIAGNOSIS — F17210 Nicotine dependence, cigarettes, uncomplicated: Secondary | ICD-10-CM | POA: Diagnosis present

## 2015-07-24 DIAGNOSIS — T400X5A Adverse effect of opium, initial encounter: Secondary | ICD-10-CM | POA: Diagnosis present

## 2015-07-24 DIAGNOSIS — E871 Hypo-osmolality and hyponatremia: Secondary | ICD-10-CM | POA: Diagnosis present

## 2015-07-24 DIAGNOSIS — D638 Anemia in other chronic diseases classified elsewhere: Secondary | ICD-10-CM | POA: Diagnosis present

## 2015-07-24 DIAGNOSIS — Z7982 Long term (current) use of aspirin: Secondary | ICD-10-CM | POA: Diagnosis not present

## 2015-07-24 DIAGNOSIS — E785 Hyperlipidemia, unspecified: Secondary | ICD-10-CM | POA: Diagnosis present

## 2015-07-24 DIAGNOSIS — R531 Weakness: Secondary | ICD-10-CM | POA: Diagnosis present

## 2015-07-24 DIAGNOSIS — Z880 Allergy status to penicillin: Secondary | ICD-10-CM | POA: Diagnosis not present

## 2015-07-24 DIAGNOSIS — R609 Edema, unspecified: Secondary | ICD-10-CM | POA: Diagnosis not present

## 2015-07-24 DIAGNOSIS — Z888 Allergy status to other drugs, medicaments and biological substances status: Secondary | ICD-10-CM | POA: Diagnosis not present

## 2015-07-24 DIAGNOSIS — E1122 Type 2 diabetes mellitus with diabetic chronic kidney disease: Secondary | ICD-10-CM | POA: Diagnosis present

## 2015-07-24 DIAGNOSIS — I129 Hypertensive chronic kidney disease with stage 1 through stage 4 chronic kidney disease, or unspecified chronic kidney disease: Secondary | ICD-10-CM | POA: Diagnosis present

## 2015-07-24 DIAGNOSIS — G9340 Encephalopathy, unspecified: Secondary | ICD-10-CM | POA: Diagnosis present

## 2015-07-24 DIAGNOSIS — N179 Acute kidney failure, unspecified: Secondary | ICD-10-CM | POA: Diagnosis present

## 2015-07-24 DIAGNOSIS — Z9889 Other specified postprocedural states: Secondary | ICD-10-CM | POA: Diagnosis not present

## 2015-07-24 DIAGNOSIS — Z923 Personal history of irradiation: Secondary | ICD-10-CM | POA: Diagnosis not present

## 2015-07-24 DIAGNOSIS — T4275XA Adverse effect of unspecified antiepileptic and sedative-hypnotic drugs, initial encounter: Secondary | ICD-10-CM | POA: Diagnosis present

## 2015-07-24 DIAGNOSIS — I2510 Atherosclerotic heart disease of native coronary artery without angina pectoris: Secondary | ICD-10-CM

## 2015-07-24 DIAGNOSIS — I509 Heart failure, unspecified: Secondary | ICD-10-CM | POA: Diagnosis not present

## 2015-07-24 DIAGNOSIS — I959 Hypotension, unspecified: Secondary | ICD-10-CM | POA: Diagnosis present

## 2015-07-24 DIAGNOSIS — Z7984 Long term (current) use of oral hypoglycemic drugs: Secondary | ICD-10-CM | POA: Diagnosis not present

## 2015-07-24 DIAGNOSIS — K5900 Constipation, unspecified: Secondary | ICD-10-CM | POA: Diagnosis present

## 2015-07-24 DIAGNOSIS — T402X5A Adverse effect of other opioids, initial encounter: Secondary | ICD-10-CM | POA: Diagnosis present

## 2015-07-24 DIAGNOSIS — N182 Chronic kidney disease, stage 2 (mild): Secondary | ICD-10-CM | POA: Diagnosis present

## 2015-07-24 DIAGNOSIS — C61 Malignant neoplasm of prostate: Secondary | ICD-10-CM | POA: Diagnosis present

## 2015-07-24 DIAGNOSIS — D509 Iron deficiency anemia, unspecified: Secondary | ICD-10-CM | POA: Diagnosis present

## 2015-07-24 DIAGNOSIS — Z9181 History of falling: Secondary | ICD-10-CM | POA: Diagnosis not present

## 2015-07-24 LAB — URINALYSIS, ROUTINE W REFLEX MICROSCOPIC
Bilirubin Urine: NEGATIVE
Glucose, UA: NEGATIVE mg/dL
Hgb urine dipstick: NEGATIVE
Ketones, ur: NEGATIVE mg/dL
Leukocytes, UA: NEGATIVE
Nitrite: NEGATIVE
Protein, ur: NEGATIVE mg/dL
Specific Gravity, Urine: 1.029 (ref 1.005–1.030)
pH: 6 (ref 5.0–8.0)

## 2015-07-24 LAB — BASIC METABOLIC PANEL
Anion gap: 11 (ref 5–15)
BUN: 22 mg/dL — ABNORMAL HIGH (ref 6–20)
CO2: 23 mmol/L (ref 22–32)
Calcium: 8.6 mg/dL — ABNORMAL LOW (ref 8.9–10.3)
Chloride: 98 mmol/L — ABNORMAL LOW (ref 101–111)
Creatinine, Ser: 1.54 mg/dL — ABNORMAL HIGH (ref 0.61–1.24)
GFR calc Af Amer: 50 mL/min — ABNORMAL LOW (ref >60)
GFR calc non Af Amer: 43 mL/min — ABNORMAL LOW (ref >60)
Glucose, Bld: 216 mg/dL — ABNORMAL HIGH (ref 65–99)
Potassium: 4.3 mmol/L (ref 3.5–5.1)
Sodium: 132 mmol/L — ABNORMAL LOW (ref 135–145)

## 2015-07-24 LAB — GLUCOSE, CAPILLARY
Glucose-Capillary: 136 mg/dL — ABNORMAL HIGH (ref 65–99)
Glucose-Capillary: 154 mg/dL — ABNORMAL HIGH (ref 65–99)
Glucose-Capillary: 142 mg/dL — ABNORMAL HIGH (ref 65–99)
Glucose-Capillary: 169 mg/dL — ABNORMAL HIGH (ref 65–99)
Glucose-Capillary: 125 mg/dL — ABNORMAL HIGH (ref 65–99)

## 2015-07-24 LAB — CBC
HCT: 27 % — ABNORMAL LOW (ref 39.0–52.0)
Hemoglobin: 8.8 g/dL — ABNORMAL LOW (ref 13.0–17.0)
MCH: 30.2 pg (ref 26.0–34.0)
MCHC: 32.6 g/dL (ref 30.0–36.0)
MCV: 92.8 fL (ref 78.0–100.0)
Platelets: 224 10*3/uL (ref 150–400)
RBC: 2.91 MIL/uL — ABNORMAL LOW (ref 4.22–5.81)
RDW: 13.7 % (ref 11.5–15.5)
WBC: 6.2 10*3/uL (ref 4.0–10.5)

## 2015-07-24 LAB — MRSA PCR SCREENING: MRSA by PCR: NEGATIVE

## 2015-07-24 LAB — PROCALCITONIN: Procalcitonin: 0.64 ng/mL

## 2015-07-24 LAB — LACTIC ACID, PLASMA
Lactic Acid, Venous: 0.9 mmol/L (ref 0.5–2.0)
Lactic Acid, Venous: 0.8 mmol/L (ref 0.5–2.0)

## 2015-07-24 LAB — CORTISOL-AM, BLOOD: Cortisol - AM: 14.5 ug/dL (ref 6.7–22.6)

## 2015-07-24 MED ORDER — ACETAMINOPHEN 325 MG PO TABS
650.0000 mg | ORAL_TABLET | Freq: Four times a day (QID) | ORAL | Status: DC | PRN
  Administered 2015-07-24: 650 mg via ORAL
  Filled 2015-07-24: qty 2

## 2015-07-24 MED ORDER — SODIUM CHLORIDE 0.9 % IV BOLUS (SEPSIS)
500.0000 mL | Freq: Once | INTRAVENOUS | Status: AC
  Administered 2015-07-24: 500 mL via INTRAVENOUS

## 2015-07-24 MED ORDER — OXYCODONE HCL 5 MG PO TABS
5.0000 mg | ORAL_TABLET | ORAL | Status: DC | PRN
  Administered 2015-07-24: 5 mg via ORAL
  Filled 2015-07-24: qty 1

## 2015-07-24 MED ORDER — ACETAMINOPHEN 650 MG RE SUPP: 650.0000 mg | Freq: Four times a day (QID) | RECTAL | Status: DC | PRN

## 2015-07-24 MED ORDER — BISACODYL 10 MG RE SUPP
10.0000 mg | Freq: Once | RECTAL | Status: AC
  Administered 2015-07-24: 10 mg via RECTAL
  Filled 2015-07-24: qty 1

## 2015-07-24 MED ORDER — ONDANSETRON HCL 4 MG PO TABS: 4.0000 mg | ORAL_TABLET | Freq: Four times a day (QID) | ORAL | Status: DC | PRN

## 2015-07-24 MED ORDER — ALLOPURINOL 100 MG PO TABS
100.0000 mg | ORAL_TABLET | Freq: Every day | ORAL | Status: DC
  Administered 2015-07-24 – 2015-07-27 (×4): 100 mg via ORAL
  Filled 2015-07-24 (×4): qty 1

## 2015-07-24 MED ORDER — DEXTROSE 5 % IV SOLN
2.0000 g | Freq: Three times a day (TID) | INTRAVENOUS | Status: DC
  Administered 2015-07-24 – 2015-07-27 (×10): 2 g via INTRAVENOUS
  Filled 2015-07-24 (×12): qty 2

## 2015-07-24 MED ORDER — FENOFIBRATE 160 MG PO TABS
160.0000 mg | ORAL_TABLET | Freq: Every evening | ORAL | Status: DC
  Administered 2015-07-24 – 2015-07-28 (×5): 160 mg via ORAL
  Filled 2015-07-24 (×5): qty 1

## 2015-07-24 MED ORDER — DOCUSATE SODIUM 100 MG PO CAPS
100.0000 mg | ORAL_CAPSULE | Freq: Two times a day (BID) | ORAL | Status: DC
  Administered 2015-07-24 – 2015-07-28 (×8): 100 mg via ORAL
  Filled 2015-07-24 (×8): qty 1

## 2015-07-24 MED ORDER — FINASTERIDE 5 MG PO TABS
5.0000 mg | ORAL_TABLET | Freq: Every day | ORAL | Status: DC
  Administered 2015-07-24 – 2015-07-27 (×4): 5 mg via ORAL
  Filled 2015-07-24 (×4): qty 1

## 2015-07-24 MED ORDER — LORATADINE 10 MG PO TABS
10.0000 mg | ORAL_TABLET | Freq: Every day | ORAL | Status: DC
  Administered 2015-07-24 – 2015-07-28 (×5): 10 mg via ORAL
  Filled 2015-07-24 (×6): qty 1

## 2015-07-24 MED ORDER — PANTOPRAZOLE SODIUM 40 MG PO TBEC
40.0000 mg | DELAYED_RELEASE_TABLET | Freq: Every day | ORAL | Status: DC | PRN
  Administered 2015-07-27: 40 mg via ORAL
  Filled 2015-07-24: qty 1

## 2015-07-24 MED ORDER — VANCOMYCIN HCL IN DEXTROSE 750-5 MG/150ML-% IV SOLN
750.0000 mg | Freq: Two times a day (BID) | INTRAVENOUS | Status: DC
Start: 2015-07-24 — End: 2015-07-27
  Administered 2015-07-24 – 2015-07-26 (×6): 750 mg via INTRAVENOUS
  Filled 2015-07-24 (×8): qty 150

## 2015-07-24 MED ORDER — SODIUM CHLORIDE 0.9 % IV SOLN
INTRAVENOUS | Status: DC
  Administered 2015-07-24: 03:00:00 via INTRAVENOUS

## 2015-07-24 MED ORDER — BICALUTAMIDE 50 MG PO TABS
50.0000 mg | ORAL_TABLET | Freq: Every day | ORAL | Status: DC
  Administered 2015-07-24 – 2015-07-27 (×4): 50 mg via ORAL
  Filled 2015-07-24 (×7): qty 1

## 2015-07-24 MED ORDER — DIAZEPAM 5 MG PO TABS: 5.0000 mg | ORAL_TABLET | ORAL | Status: DC | PRN

## 2015-07-24 MED ORDER — HYDROMORPHONE HCL 1 MG/ML IJ SOLN
0.5000 mg | INTRAMUSCULAR | Status: DC | PRN
  Administered 2015-07-24: 0.5 mg via INTRAVENOUS
  Filled 2015-07-24: qty 1

## 2015-07-24 MED ORDER — ONDANSETRON HCL 4 MG/2ML IJ SOLN: 4.0000 mg | Freq: Four times a day (QID) | INTRAMUSCULAR | Status: DC | PRN

## 2015-07-24 MED ORDER — SODIUM CHLORIDE 0.9 % IV SOLN
INTRAVENOUS | Status: DC
  Administered 2015-07-24 – 2015-07-25 (×2): via INTRAVENOUS
  Administered 2015-07-27: 75 mL/h via INTRAVENOUS
  Administered 2015-07-28: 03:00:00 via INTRAVENOUS

## 2015-07-24 MED ORDER — DOCUSATE SODIUM 100 MG PO CAPS
100.0000 mg | ORAL_CAPSULE | Freq: Three times a day (TID) | ORAL | Status: DC
  Filled 2015-07-24: qty 1

## 2015-07-24 MED ORDER — SENNA 8.6 MG PO TABS
1.0000 | ORAL_TABLET | Freq: Every day | ORAL | Status: DC
  Administered 2015-07-24 – 2015-07-28 (×5): 8.6 mg via ORAL
  Filled 2015-07-24 (×5): qty 1

## 2015-07-24 MED ORDER — ASPIRIN EC 325 MG PO TBEC
325.0000 mg | DELAYED_RELEASE_TABLET | Freq: Every day | ORAL | Status: DC
  Administered 2015-07-24 – 2015-07-28 (×5): 325 mg via ORAL
  Filled 2015-07-24 (×5): qty 1

## 2015-07-24 MED ORDER — HYDROCODONE-ACETAMINOPHEN 5-325 MG PO TABS
1.0000 | ORAL_TABLET | Freq: Four times a day (QID) | ORAL | Status: DC | PRN
  Administered 2015-07-24 – 2015-07-25 (×3): 2 via ORAL
  Filled 2015-07-24 (×3): qty 2

## 2015-07-24 MED ORDER — POLYETHYLENE GLYCOL 3350 17 G PO PACK
17.0000 g | PACK | Freq: Every day | ORAL | Status: DC
  Administered 2015-07-24 – 2015-07-28 (×5): 17 g via ORAL
  Filled 2015-07-24 (×5): qty 1

## 2015-07-24 MED ORDER — SODIUM CHLORIDE 0.9% FLUSH
3.0000 mL | Freq: Two times a day (BID) | INTRAVENOUS | Status: DC
  Administered 2015-07-24 – 2015-07-28 (×7): 3 mL via INTRAVENOUS

## 2015-07-24 MED ORDER — PRAVASTATIN SODIUM 20 MG PO TABS
20.0000 mg | ORAL_TABLET | Freq: Every evening | ORAL | Status: DC
Start: 2015-07-24 — End: 2015-07-28
  Administered 2015-07-24 – 2015-07-28 (×5): 20 mg via ORAL
  Filled 2015-07-24 (×5): qty 1
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
## 2015-07-24 NOTE — Progress Notes (Addendum)
[facility] PROGRESS NOTE  TAYVON CULLEY IEP:[id number] DOB: 12-05-1940 DOA: 07/23/2015 PCP: [name], MD  Assessment/Plan: Cody Moss is a 75 y.o. male with a recent Lumbar Laminectomy 5 days ago ( Dr [facility]) and history of HTN, Hyperlipidemia PVD, AAA repair who presents to the ED with complains of increased confusion, and weakness, and fall yesterday. He reports having dizziness upon standing, and weakness in both of his legs and his legs not being able to hold him up. His family reports that he had slurring of his speech yesterday.  Acute encephalopathy Multifactorial, related to infection, fever, medications, multiples sedatives.  Ammonia 14.  Check MRI, less likely stroke.  IV fluids. Hold sedatives.   Hypotension, mild fever:  Chest x ray negative, UA negative.  Follow up Blood culture.  Lactic acid at 1.8.  IV bolus.  Start empirically IV antibiotics.  LFT normal.  Could repeat Chest x ray after hydration.  Incentive spirometry.   Anemia; follow trend.  Hb range 9--10.    Constipation; no BM in last 4 days, passing gas. Will order dulcolax suppository.   Acute Renal failure on chronic stage II Cr  On admission at 1.78.  Trending down, continue with IV fluids.   Hyponatremia; Continue with IV fluids.   Status post lumbar surgery; Pain Control PRN, relates worsening weakness left leg. I have contacted Neurosurgery/   CT finding: Mild luminal narrowing along the proximal superficial femoral arteries bilaterally, and moderate luminal narrowing along the distal left superficial femoral artery. 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 Scattered calcification within the arterial vasculature of both lower extremities. Focal ectasia at the right internal iliac artery, containing mild thrombus. Scattered calcification of the common femoral arteries bilaterally, with mild  mural thrombus. 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 Patient to follow up with Dr Scot Dock in one months after his recovery from admission.      Code Status: Full Code.  Family Communication: Care discussed with Wife who was at bedside.  Disposition Plan: Remain inpatient.    Consultants:  Neurosurgery  Vascular Sx phone consult.   Procedures:  none  Antibiotics:  Vancomycin  Aztreonam/   HPI/Subjective: He is sleepy , wake up answer some question. Per wife he has been confuse since Friday. Vadium was discontinue. He is having more difficulty moving left leg since after sx. No BM since 4 days ago   Objective: Filed Vitals:   07/24/15 0158 07/24/15 0520  BP: 109/82 119/53  Pulse: 93 89  Temp: 99.3 F (37.4 C) 100.4 F (38 C)  Resp: 22 18    Intake/Output Summary (Last 24 hours) at 07/24/15 0931 Last data filed at 07/24/15 0521  Gross per 24 hour  Intake    240 ml  Output    450 ml  Net   -210 ml   Filed Weights   07/24/15 0158  Weight: 90.855 kg (200 lb 4.8 oz)    Exam:   General:  Sleepy, no acute distress  Cardiovascular: S 1, S 2 RRR  Respiratory: CTA  Abdomen: BS present, distend.   Musculoskeletal: trace edema  Data Reviewed: Basic Metabolic Panel:  Recent Labs Lab 07/23/15 2032 07/24/15 0330  NA 131* 132*  K 4.4 4.3  CL 96* 98*  CO2 22 23  GLUCOSE 173* 216*  BUN 24* 22*  CREATININE 1.78* 1.54*  CALCIUM 9.5 8.6*   Liver Function Tests:  Recent Labs  Lab 07/23/15 2044  AST 20  ALT 15*  ALKPHOS 28*  BILITOT 0.8  PROT 7.5  ALBUMIN 3.4*   No results for input(s): LIPASE, AMYLASE in the last 168 hours.  Recent Labs Lab 07/23/15 2055  AMMONIA 14   CBC:  Recent Labs Lab 07/23/15 2032 07/24/15 0330  WBC 8.8 6.2  HGB 10.7* 8.8*  HCT 31.0* 27.0*  MCV 93.4 92.8  PLT 247 224   Cardiac Enzymes: No results for input(s): CKTOTAL, CKMB, CKMBINDEX, TROPONINI in the last 168 hours. BNP (last 3  results) No results for input(s): BNP in the last 8760 hours.  ProBNP (last 3 results) No results for input(s): PROBNP in the last 8760 hours.  CBG:  Recent Labs Lab 07/23/15 2009 07/24/15 0210 07/24/15 0700  GLUCAP 169* 136* 154*    No results found for this or any previous visit (from the past 240 hour(s)).   Studies: Dg Chest 2 View  07/23/2015  CLINICAL DATA:  Altered awareness, transient. EXAM: CHEST  2 VIEW COMPARISON:  None. FINDINGS: Heart size is upper normal. Lungs are at least mildly hyperexpanded suggesting COPD. Suspect associated chronic bronchitic changes centrally. No confluent airspace opacity to suggest a developing pneumonia. No pleural effusion no pneumothorax seen. Osseous structures about the chest are unremarkable. Mild degenerative spurring within the thoracic spine. IMPRESSION: 1. No acute findings.  No evidence of pneumonia. 2. Lungs at least mildly hyperexpanded suggesting COPD. Suspect associated chronic bronchitic changes centrally. Electronically Signed   By: Franki Cabot M.D.   On: 07/23/2015 22:27   Ct Head Wo Contrast  07/23/2015  CLINICAL DATA:  Slurred speech since yesterday. Lumbar back surgery on Tuesday. Fall from bed this afternoon. Leg weakness. EXAM: CT HEAD WITHOUT CONTRAST TECHNIQUE: Contiguous axial images were obtained from the base of the skull through the vertex without intravenous contrast. COMPARISON:  None. FINDINGS: Brain: There is mild generalized brain atrophy with commensurate dilatation of the sulci. Ventricles are normal in size and configuration. All areas of the brain demonstrate normal gray-white matter attenuation. There is no mass, hemorrhage, edema or other evidence of acute parenchymal abnormality. No extra-axial hemorrhage. Vascular: No hyperdense vessel or unexpected calcification. There are chronic calcified atherosclerotic changes of the large vessels at the skull base. Skull: Negative for fracture or focal lesion.  Sinuses/Orbits: No acute findings. Other: None. IMPRESSION: Negative head CT.  No intracranial mass, hemorrhage or edema. Electronically Signed   By: Franki Cabot M.D.   On: 07/23/2015 22:37   Ct Angio Ao+bifem W/cm &/or Wo/cm  07/23/2015  CLINICAL DATA:  Acute onset of slurred speech. [place] out of bed; difficulty standing up. Recent lower back surgery. Initial encounter. EXAM: CT ANGIOGRAPHY OF ABDOMINAL AORTA WITH ILIOFEMORAL RUNOFF TECHNIQUE: Multidetector CT imaging of the abdomen, pelvis and lower extremities was performed using the standard protocol during bolus administration of intravenous contrast. Multiplanar CT image reconstructions and MIPs were obtained to evaluate the vascular anatomy. CONTRAST:  80 mL of Isovue 370 IV contrast COMPARISON:  CT of the abdomen and pelvis from 10/29/2014 FINDINGS: Aorta: There is mild aneurysmal dilatation of the descending thoracic and proximal abdominal aorta, measuring 3.5 cm in AP dimension at the level of the diaphragm. This resolves relatively proximally, above the level of the renal arteries. An aortoiliac stent is noted distal to the renal arteries, and appears fully patent. The celiac trunk, superior mesenteric artery, and bilateral renal arteries appear patent. There appears to be reconstitution of the inferior mesenteric artery from mesenteric branches.  There is focal ectasia at the right internal iliac artery, containing mild thrombus. Scattered calcification is noted at the common femoral arteries bilaterally, with mild mural thrombus. Right Lower Extremity: There is scattered calcification along the right common femoral artery, right superficial femoral artery and popliteal artery. Mild luminal narrowing is suggested along the proximal superficial femoral artery. Mild calcification is seen along the branches of the popliteal artery. There is patent 3 vessel runoff to the level of the right lower leg. Left Lower Extremity: There is scattered calcification  along the left common femoral artery, left superficial femoral artery and popliteal artery. Mild luminal narrowing is noted along the proximal left superficial femoral artery, and moderate luminal narrowing is seen along the distal left superficial femoral artery. There is mild aneurysmal dilation of the proximal popliteal artery to 1.4 cm, with mild associated mural thrombus. Scattered calcification is seen along the branches of the popliteal artery. Three-vessel runoff is noted to the level of the left lower leg. Diffuse coronary artery calcifications are seen. Mild bibasilar atelectasis is noted. Scattered calcified nodes are noted adjacent to the distal esophagus, and a calcified granuloma is noted at the left lung base. The liver and spleen are unremarkable in appearance. Vaguely increased attenuation within the gallbladder may reflect stones. The gallbladder is otherwise unremarkable. The pancreas and adrenal glands are unremarkable. Numerous large cysts are again noted about the left kidney. Smaller right renal cysts are seen. Nonspecific perinephric stranding is noted bilaterally. There is no evidence of hydronephrosis. No renal or ureteral stones are seen. No free fluid is identified. The small bowel is unremarkable in appearance. The stomach is within normal limits. The appendix is normal in caliber, without evidence of appendicitis. Scattered diverticulosis is noted along the distal descending and sigmoid colon, without evidence of diverticulitis. The bladder is mildly distended and grossly unremarkable. The patient is status post prostatectomy. No inguinal lymphadenopathy is seen. The lower extremities are grossly unremarkable in appearance. No significant soft tissue edema is seen. The musculature is grossly unremarkable in appearance. No acute osseous abnormalities are identified. No acute osseous abnormalities are identified. Review of the MIP images confirms the above findings. IMPRESSION: 1. No  acute occlusion seen. Mild luminal narrowing along the proximal superficial femoral arteries bilaterally, and moderate luminal narrowing along the distal left superficial femoral artery. 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 Scattered calcification within the arterial vasculature of both lower extremities. Three-vessel runoff noted to the lower legs bilaterally, though the vasculature is not well seen at the ankles. 2. Aortoiliac stent appears fully patent. 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 Scattered calcification along the abdominal aorta and its branches. Recommend followup by ultrasound in 2 years. This recommendation follows ACR consensus guidelines: White Paper of the ACR Incidental Findings Committee II on Vascular Findings. J Am Coll Radiol 2013; 10:789-794. 3. Focal ectasia at the right internal iliac artery, containing mild thrombus. Scattered calcification of the common femoral arteries bilaterally, with mild mural thrombus. 4. Diffuse coronary artery calcifications seen. 5. Mild bibasilar atelectasis noted. Calcified nodes adjacent to the distal esophagus, reflecting remote granulomatous disease. 6. Vaguely increased attenuation within the gallbladder may reflect stones. Gallbladder otherwise unremarkable. 7. Numerous large left renal cysts.  Smaller right renal cysts. 8. Scattered diverticulosis along the distal descending and sigmoid colon, without evidence of diverticulitis. Electronically Signed  By: Garald Balding M.D.   On: 07/23/2015 22:54   Ct L-spine No Charge  07/23/2015  CLINICAL DATA:  Lower lumbar surgery on Tuesday, slurred speech yesterday, fall from bed this afternoon. Leg weakness. EXAM: CT LUMBAR SPINE WITHOUT CONTRAST TECHNIQUE: Multidetector CT imaging of the lumbar spine was performed without  intravenous contrast administration. Multiplanar CT image reconstructions were also generated. COMPARISON:  None. FINDINGS: There are surgical changes of recent left hemi-laminectomies at the L5 and S1 levels. There is expected postsurgical edema and air at the laminectomy sites. No evidence of hematoma or other postsurgical complicating feature. Disc bulges are noted at the L4-5 and L5-S1 levels, causing at least moderate central canal stenosis at the L4-5 level with the additional degenerative facet hypertrophy. There is grossly adequate decompression at the L5-S1 level. There is no acute or suspicious osseous lesion within the lumbar spine or upper sacrum. Atherosclerotic changes are seen along the walls of the normal-caliber abdominal aorta and there are surgical changes of aorta bi-iliac graft which appears patent and stable compared to an earlier CT of 10/29/2014. Multiple cysts are partially imaged within the left kidney, similar in configuration to the CT abdomen of 10/29/2014. No acute- appearing abnormality within the paravertebral soft tissues. IMPRESSION: 1. Surgical changes of recent left hemi-laminectomies at the L5 and S1 levels. Expected postsurgical changes. No evidence of surgical complicating feature. Adequate decompression at the L5-S1 level. 2. Disc bulge at the L4-5 level, combined with degenerative facet hypertrophy, causing moderate central canal stenosis with possible associated nerve root impingements. 3. No acute or suspicious osseous abnormality. No osseous fracture or dislocation. 4. Chronic/incidental findings within the paravertebral soft tissues, as detailed above. Electronically Signed   By: Franki Cabot M.D.   On: 07/23/2015 23:03    Scheduled Meds: . allopurinol  100 mg Oral QHS  . aspirin EC  325 mg Oral Daily  . bicalutamide  50 mg Oral QHS  . docusate sodium  100 mg Oral TID  . fenofibrate  160 mg Oral QPM  . finasteride  5 mg Oral QHS  . polyethylene glycol  17 g  Oral Daily  . pravastatin  20 mg Oral QPM  . sodium chloride flush  3 mL Intravenous Q12H   Continuous Infusions: . sodium chloride 75 mL/hr at 07/24/15 0316    Active Problems:   HTN (hypertension)   Coronary artery disease   AKI (acute kidney injury) ([place])   Acute encephalopathy   Falls   Hyponatremia   Hypotension   Status post lumbar surgery    Time spent: 35 minutes.     Niel Hummer A  [facility] Pager [phone]. If 7PM-7AM, please contact night-coverage at [email or web address], password [facility] 07/24/2015, 9:31 AM  LOS: 0 days

## 2015-07-24 NOTE — ED Notes (Signed)
Admitting at bedside 

## 2015-07-24 NOTE — Progress Notes (Signed)
Pharmacy Antibiotic Note  Cody Moss is a 75 y.o. male admitted on 07/23/2015 with sepsis from unclear source.  Pharmacy has been consulted for vanc/aztreonam dosing. He is s/p lumbar laminectomy 5 days ago.   Plan: Vanc 750 mg IV q12h  Aztreonam 2 g IV q8h  Monitor renal function, cultures, clinical status, LOT, vanc levels prn  Height: '5\' 11"'$  (180.3 cm) Weight: 200 lb 4.8 oz (90.855 kg) IBW/kg (Calculated) : 75.3  Temp (24hrs), Avg:99.3 F (37.4 C), Min:98.3 F (36.8 C), Max:100.4 F (38 C)   Recent Labs Lab 07/23/15 2032 07/23/15 2044 07/24/15 0330  WBC 8.8  --  6.2  CREATININE 1.78*  --  1.54*  LATICACIDVEN  --  1.85  --     Estimated Creatinine Clearance: 50 mL/min (by C-G formula based on Cr of 1.54).    Allergies  Allergen Reactions  . Statins Other (See Comments)    MUSCLE CRAMPS  . Valium [Diazepam] Other (See Comments)    Talking out of his head  . Adhesive [Tape] Itching and Rash    Blisters, Please use "paper" tape  . Penicillins Rash    Has patient had a PCN reaction causing immediate rash, facial/tongue/throat swelling, SOB or lightheadedness with hypotension: Unknown Has patient had a PCN reaction causing severe rash involving mucus membranes or skin necrosis: No Has patient had a PCN reaction that required hospitalization No Has patient had a PCN reaction occurring within the last 10 years: No If all of the above answers are "NO", then may proceed with Cephalosporin use.     Antimicrobials this admission: vanc 4/2 >>  aztreonam 4/2 >>   Dose adjustments this admission: n/a  Microbiology results: 4/2 BCx:   4/2 MRSA PCR:   Thank you for allowing pharmacy to be a part of this patient's care.  Joya San, PharmD Clinical Pharmacy Resident Pager # [phone] 07/24/2015 10:45 AM

## 2015-07-25 ENCOUNTER — Inpatient Hospital Stay (HOSPITAL_COMMUNITY): Payer: Medicare Other

## 2015-07-25 LAB — BASIC METABOLIC PANEL
Anion gap: 13 (ref 5–15)
BUN: 16 mg/dL (ref 6–20)
CO2: 23 mmol/L (ref 22–32)
Calcium: 8.8 mg/dL — ABNORMAL LOW (ref 8.9–10.3)
Chloride: 98 mmol/L — ABNORMAL LOW (ref 101–111)
Creatinine, Ser: 1.37 mg/dL — ABNORMAL HIGH (ref 0.61–1.24)
GFR calc Af Amer: 58 mL/min — ABNORMAL LOW (ref >60)
GFR calc non Af Amer: 50 mL/min — ABNORMAL LOW (ref >60)
Glucose, Bld: 175 mg/dL — ABNORMAL HIGH (ref 65–99)
Potassium: 4.1 mmol/L (ref 3.5–5.1)
Sodium: 134 mmol/L — ABNORMAL LOW (ref 135–145)

## 2015-07-25 LAB — CBC
HCT: 27 % — ABNORMAL LOW (ref 39.0–52.0)
Hemoglobin: 9 g/dL — ABNORMAL LOW (ref 13.0–17.0)
MCH: 31 pg (ref 26.0–34.0)
MCHC: 33.3 g/dL (ref 30.0–36.0)
MCV: 93.1 fL (ref 78.0–100.0)
Platelets: 251 10*3/uL (ref 150–400)
RBC: 2.9 MIL/uL — ABNORMAL LOW (ref 4.22–5.81)
RDW: 13.6 % (ref 11.5–15.5)
WBC: 6.6 10*3/uL (ref 4.0–10.5)

## 2015-07-25 LAB — GLUCOSE, CAPILLARY: Glucose-Capillary: 231 mg/dL — ABNORMAL HIGH (ref 65–99)

## 2015-07-25 MED ORDER — HYDROCODONE-ACETAMINOPHEN 5-325 MG PO TABS
1.0000 | ORAL_TABLET | ORAL | Status: DC | PRN
  Administered 2015-07-25 – 2015-07-28 (×15): 2 via ORAL
  Filled 2015-07-25 (×15): qty 2

## 2015-07-25 MED ORDER — COLCHICINE 0.6 MG PO TABS
0.6000 mg | ORAL_TABLET | Freq: Every day | ORAL | Status: DC
  Administered 2015-07-25 – 2015-07-28 (×4): 0.6 mg via ORAL
  Filled 2015-07-25 (×4): qty 1

## 2015-07-25 NOTE — Evaluation (Signed)
Physical Therapy Evaluation Patient Details Name: Cody Moss MRN: [id number] DOB: 1941/02/11 Today's Date: 07/25/2015   History of Present Illness  75 y.o. male with a recent Lumbar Laminectomy 5 days ago ( Dr [facility]) and history of HTN, Hyperlipidemia PVD, AAA repair who presents to the ED with complains of increased confusion, and weakness, and fall yesterday  Clinical Impression  Patient demonstrates deficits in functional mobility as indicated below. Will need continued skilled PT to address deficits and maximize function. Will see as indicated and progress as tolerated. OF NOTE: Patient with BACK precautions, educated and reviewed extensively with patient and family.     Follow Up Recommendations Home health PT;Supervision for mobility/OOB    Equipment Recommendations  Rolling walker with 5" wheels    Recommendations for Other Services       Precautions / Restrictions Precautions Precautions: Back;Fall Precaution Booklet Issued: Yes (comment) Precaution Comments: reviewed back precautions with patient and daughter at bedside, hand out provided      Mobility  Bed Mobility Overal bed mobility: Needs Assistance Bed Mobility: Rolling;Sidelying to Sit;Sit to Sidelying Rolling: Min assist Sidelying to sit: Mod assist     Sit to sidelying: Mod assist General bed mobility comments: increased assist required secondary to pain. Educated on precautions and cued for technique and sequencing for log roll  Transfers Overall transfer level: Needs assistance Equipment used: Rolling walker (2 wheeled) Transfers: Sit to/from Stand Sit to Stand: Min assist         General transfer comment: Min assist to elevate to standing, increased time to perform, increased pain noted when coming to upright. VCs for hand placement and technqiue  Ambulation/Gait Ambulation/Gait assistance: Min guard Ambulation Distance (Feet): 10 Feet Assistive device: Rolling walker (2  wheeled) Gait Pattern/deviations: Step-to pattern;Decreased stride length;Trunk flexed Gait velocity: decreased   General Gait Details: heavy reliance on RW, increased pain in left great toe  Stairs            Wheelchair Mobility    Modified Rankin (Stroke Patients Only)       Balance Overall balance assessment: History of Falls                                           Pertinent Vitals/Pain Pain Assessment: Faces Faces Pain Scale: Hurts even more Pain Location: back and bilateral hips L>R, left great toe Pain Descriptors / Indicators: Guarding;Sharp;Aching Pain Intervention(s): Limited activity within patient's tolerance;Monitored during session;Repositioned    Home Living Family/patient expects to be discharged to:: Private residence Living Arrangements: Spouse/significant other Available Help at Discharge: Family Type of Home: Mobile home Home Access: Ramped entrance     Home Layout: One level Home Equipment: None      Prior Function Level of Independence: Independent         Comments: independent prior to back surgery      Hand Dominance   Dominant Hand: Right    Extremity/Trunk Assessment   Upper Extremity Assessment: Overall WFL for tasks assessed           Lower Extremity Assessment: Generalized weakness;LLE deficits/detail (pain in hips limiting assessment)         Communication   Communication: No difficulties (dysarthric speech)  Cognition Arousal/Alertness: Awake/alert Behavior During Therapy: WFL for tasks assessed/performed Overall Cognitive Status: Within Functional Limits for tasks assessed  General Comments      Exercises        Assessment/Plan    PT Assessment Patient needs continued PT services  PT Diagnosis Difficulty walking;Abnormality of gait;Generalized weakness;Acute pain   PT Problem List Decreased strength;Decreased activity tolerance;Decreased  balance;Decreased mobility;Decreased coordination;Decreased knowledge of use of DME;Decreased safety awareness;Pain  PT Treatment Interventions DME instruction;Gait training;Stair training;Functional mobility training;Therapeutic activities;Therapeutic exercise;Balance training;Patient/family education   PT Goals (Current goals can be found in the Care Plan section) Acute Rehab PT Goals Patient Stated Goal: to go home PT Goal Formulation: With patient/family Time For Goal Achievement: 08/08/15 Potential to Achieve Goals: Fair    Frequency Min 3X/week   Barriers to discharge        Co-evaluation               End of Session Equipment Utilized During Treatment: Gait belt Activity Tolerance: Patient limited by pain Patient left: in bed;with call bell/phone within reach;with bed alarm set;with family/visitor present Nurse Communication: Mobility status;Precautions (BACK)         Time: 1499-6924 PT Time Calculation (min) (ACUTE ONLY): 28 min   Charges:   PT Evaluation $PT Eval Moderate Complexity: 1 Procedure PT Treatments $Therapeutic Activity: 8-22 mins   PT G CodesDuncan Dull 19-Aug-2015, 5:05 PM  Alben Deeds, [place] DPT  [phone]

## 2015-07-25 NOTE — Progress Notes (Signed)
Per family and pt, pt will not go to MRI overnight. MRI notified.

## 2015-07-25 NOTE — Progress Notes (Signed)
[facility] PROGRESS NOTE  Cody Moss WGY:[id number] DOB: 07-Aug-1940 DOA: 07/23/2015 PCP: [name], MD  Assessment/Plan: Cody Moss is a 75 y.o. male with a recent Lumbar Laminectomy 5 days ago ( Dr [facility]) and history of HTN, Hyperlipidemia PVD, AAA repair who presents to the ED with complains of increased confusion, and weakness, and fall yesterday. He reports having dizziness upon standing, and weakness in both of his legs and his legs not being able to hold him up. His family reports that he had slurring of his speech yesterday.  Acute encephalopathy Multifactorial, related to infection, fever, medications, multiples sedatives.  Ammonia 14.  Check MRI, less likely stroke.  IV fluids. Hold sedatives.  Improved. Back to baseline.   Hypotension, mild fever:  Chest x ray negative, UA negative.  Blood culture; No growth to date.  Lactic acid at 1.8.  Continue with empirically IV antibiotics.  LFT normal.  Repeated x ray negative, patient report cough.  Incentive spirometry.  Improved.   Anemia; follow trend.  Hb range 9--10.  Hb stable  Constipation; had BM. Continue with miralax.   Gout; started colchicine.   Acute Renal failure on chronic stage II Cr  On admission at 1.78.  Trending down, continue with IV fluids.   Hyponatremia; Continue with IV fluids.   Status post lumbar surgery; Pain Control PRN, neurosurgeon following.  MRI post operative fluids.   CT finding: Mild luminal narrowing along the proximal superficial femoral arteries bilaterally, and moderate luminal narrowing along the distal left superficial femoral artery. 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 Scattered calcification within the arterial vasculature of both lower extremities. Focal ectasia at the right internal iliac artery, containing mild thrombus. Scattered calcification of the common  femoral arteries bilaterally, with mild mural thrombus. 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 Patient to follow up with Dr Scot Dock in one months after his recovery from admission.      Code Status: Full Code.  Family Communication: Care discussed with Wife who was at bedside.  Disposition Plan: Remain inpatient.    Consultants:  Neurosurgery  Vascular Sx phone consult.   Procedures:  none  Antibiotics:  Vancomycin  Aztreonam/   HPI/Subjective: He is feeling better. Afebrile, almost back at baseline per family  had BM Complaining of left toe pain.   Objective: Filed Vitals:   07/25/15 0650 07/25/15 1200  BP: 130/65 116/53  Pulse: 94 72  Temp: 98.9 F (37.2 C) 98.8 F (37.1 C)  Resp: 18 18    Intake/Output Summary (Last 24 hours) at 07/25/15 1331 Last data filed at 07/25/15 1314  Gross per 24 hour  Intake 2961.67 ml  Output   1350 ml  Net 1611.67 ml   Filed Weights   07/24/15 0158 07/25/15 0650  Weight: 90.855 kg (200 lb 4.8 oz) 90.311 kg (199 lb 1.6 oz)    Exam:   General:  Sleepy, no acute distress  Cardiovascular: S 1, S 2 RRR  Respiratory: CTA  Abdomen: BS present, distend.   Musculoskeletal: trace edema  Data Reviewed: Basic Metabolic Panel:  Recent Labs Lab 07/23/15 2032 07/24/15 0330 07/25/15 1154  NA 131* 132* 134*  K 4.4 4.3 4.1  CL 96* 98* 98*  CO2 '22 23 23  '$ GLUCOSE 173* 216* 175*  BUN 24* 22* 16  CREATININE 1.78* 1.54* 1.37*  CALCIUM 9.5 8.6* 8.8*   Liver Function Tests:  Recent Labs Lab 07/23/15  2044  AST 20  ALT 15*  ALKPHOS 28*  BILITOT 0.8  PROT 7.5  ALBUMIN 3.4*   No results for input(s): LIPASE, AMYLASE in the last 168 hours.  Recent Labs Lab 07/23/15 2055  AMMONIA 14   CBC:  Recent Labs Lab 07/23/15 2032 07/24/15 0330 07/25/15 1154  WBC 8.8 6.2 6.6  HGB 10.7* 8.8* 9.0*  HCT 31.0* 27.0* 27.0*  MCV 93.4 92.8 93.1  PLT 247 224 251   Cardiac Enzymes: No results for  input(s): CKTOTAL, CKMB, CKMBINDEX, TROPONINI in the last 168 hours. BNP (last 3 results) No results for input(s): BNP in the last 8760 hours.  ProBNP (last 3 results) No results for input(s): PROBNP in the last 8760 hours.  CBG:  Recent Labs Lab 07/24/15 0210 07/24/15 0700 07/24/15 1204 07/24/15 1638 07/24/15 2057  GLUCAP 136* 154* 142* 169* 125*    Recent Results (from the past 240 hour(s))  Culture, blood (routine x 2)     Status: None (Preliminary result)   Collection Time: 07/24/15  7:00 AM  Result Value Ref Range Status   Specimen Description LEFT ANTECUBITAL  Final   Special Requests BOTTLES DRAWN AEROBIC AND ANAEROBIC 10CC  Final   Culture NO GROWTH 1 DAY  Final   Report Status PENDING  Incomplete  Culture, blood (routine x 2)     Status: None (Preliminary result)   Collection Time: 07/24/15  7:03 AM  Result Value Ref Range Status   Specimen Description BLOOD RIGHT HAND  Final   Special Requests BOTTLES DRAWN AEROBIC ONLY 5CC  Final   Culture NO GROWTH 1 DAY  Final   Report Status PENDING  Incomplete  MRSA PCR Screening     Status: None   Collection Time: 07/24/15  2:53 PM  Result Value Ref Range Status   MRSA by PCR NEGATIVE NEGATIVE Final    Comment:        The GeneXpert MRSA Assay (FDA approved for NASAL specimens only), is one component of a comprehensive MRSA colonization surveillance program. It is not intended to diagnose MRSA infection nor to guide or monitor treatment for MRSA infections.      Studies: Dg Chest 1 View  07/25/2015  CLINICAL DATA:  Fever following recent fall and back surgery. EXAM: CHEST 1 VIEW COMPARISON:  07/23/2015 FINDINGS: Upper limits normal heart size identified. There is no evidence of focal airspace disease, pulmonary edema, suspicious pulmonary nodule/mass, pleural effusion, or pneumothorax. No acute bony abnormalities are identified. IMPRESSION: No active disease. Electronically Signed   By: Margarette Canada M.D.   On:  07/25/2015 11:56   Dg Chest 2 View  07/23/2015  CLINICAL DATA:  Altered awareness, transient. EXAM: CHEST  2 VIEW COMPARISON:  None. FINDINGS: Heart size is upper normal. Lungs are at least mildly hyperexpanded suggesting COPD. Suspect associated chronic bronchitic changes centrally. No confluent airspace opacity to suggest a developing pneumonia. No pleural effusion no pneumothorax seen. Osseous structures about the chest are unremarkable. Mild degenerative spurring within the thoracic spine. IMPRESSION: 1. No acute findings.  No evidence of pneumonia. 2. Lungs at least mildly hyperexpanded suggesting COPD. Suspect associated chronic bronchitic changes centrally. Electronically Signed   By: Franki Cabot M.D.   On: 07/23/2015 22:27   Ct Head Wo Contrast  07/23/2015  CLINICAL DATA:  Slurred speech since yesterday. Lumbar back surgery on Tuesday. Fall from bed this afternoon. Leg weakness. EXAM: CT HEAD WITHOUT CONTRAST TECHNIQUE: Contiguous axial images were obtained from the base of the  skull through the vertex without intravenous contrast. COMPARISON:  None. FINDINGS: Brain: There is mild generalized brain atrophy with commensurate dilatation of the sulci. Ventricles are normal in size and configuration. All areas of the brain demonstrate normal gray-white matter attenuation. There is no mass, hemorrhage, edema or other evidence of acute parenchymal abnormality. No extra-axial hemorrhage. Vascular: No hyperdense vessel or unexpected calcification. There are chronic calcified atherosclerotic changes of the large vessels at the skull base. Skull: Negative for fracture or focal lesion. Sinuses/Orbits: No acute findings. Other: None. IMPRESSION: Negative head CT.  No intracranial mass, hemorrhage or edema. Electronically Signed   By: Franki Cabot M.D.   On: 07/23/2015 22:37   Mr Brain Wo Contrast  07/24/2015  CLINICAL DATA:  Confusion.  Recent lumbar discectomy. EXAM: MRI HEAD WITHOUT CONTRAST TECHNIQUE:  Multiplanar, multiecho pulse sequences of the brain and surrounding structures were obtained without intravenous contrast. COMPARISON:  Head CT 07/23/2015 and MRI 02/23/2004 FINDINGS: A non expanded partially empty sella is again seen. The position of the cerebellar tonsils is unchanged and within normal limits. There is no evidence of acute infarct, intracranial hemorrhage, mass, midline shift, or extra-axial fluid collection. There is mild generalized cerebral atrophy. Periventricular white matter T2 hyperintensities are similar to the prior MRI and nonspecific but compatible with mild chronic small vessel ischemic disease. Prior bilateral cataract extraction is noted. Very mild paranasal sinus mucosal thickening is noted with likely mucous retention cysts in the maxillary sinuses. The mastoid air cells are clear. Major intracranial vascular flow voids are preserved. IMPRESSION: 1. No acute intracranial abnormality. 2. Mild chronic small vessel ischemic disease. Electronically Signed   By: Logan Bores M.D.   On: 07/24/2015 16:14   Mr Lumbar Spine Wo Contrast  07/25/2015  CLINICAL DATA:  Left lower extremity weakness. Lumbar laminectomy 07/19/2015 EXAM: MRI LUMBAR SPINE WITHOUT CONTRAST TECHNIQUE: Multiplanar, multisequence MR imaging of the lumbar spine was performed. No intravenous contrast was administered. COMPARISON:  Lumbar MRI 06/05/2015 FINDINGS: Postop laminectomy on the left at L5-S1. Negative for fluid collection. Negative for fracture or mass. Conus medullaris normal and terminates at L2-3. Severe dilatation left renal pelvis as noted on prior imaging. L1-2:  Mild disc and facet degeneration without spinal stenosis L2-3: Mild disc bulging. Bilateral facet hypertrophy causing mild spinal stenosis. L3-4: Mild disc and facet degeneration without significant spinal stenosis L4-5: Diffuse disc bulging. Moderate facet hypertrophy bilaterally. Mild spinal stenosis. L5-S1: Left laminectomy. Subcutaneous  fluid collection measuring 32 x 16 mm with small gas bubbles related to recent surgery. Mild disc space narrowing. Schmorl's node of the right S1 superior endplate is unchanged. Moderate foraminal narrowing bilaterally Epidural soft tissue structure on the left ventral epidural space may represent recurrent disc protrusion or epidural hematoma. This extends caudally behind the S1 vertebral body. This appears to be compressing the left S1 nerve root suggesting this is likely a disc protrusion. Postop tissue thickening considered less likely. Postcontrast may be helpful to differentiate postoperative change from disc protrusion and epidural hematoma. No significant spinal stenosis. IMPRESSION: Postop laminectomy on the left at L5-S1. Subcutaneous fluid collection most likely is a benign postop seroma. CSF collection and infection not excluded. Soft tissue in the ventral epidural space on the left at L5-S1 likely represent recurrent disc protrusion. Differential includes epidural hematoma and postop edema. Postcontrast imaging may be helpful for differentiation. Electronically Signed   By: Franchot Gallo M.D.   On: 07/25/2015 11:12   Ct Angio Ao+bifem W/cm &/or Wo/cm  07/23/2015  CLINICAL DATA:  Acute onset of slurred speech. [place] out of bed; difficulty standing up. Recent lower back surgery. Initial encounter. EXAM: CT ANGIOGRAPHY OF ABDOMINAL AORTA WITH ILIOFEMORAL RUNOFF TECHNIQUE: Multidetector CT imaging of the abdomen, pelvis and lower extremities was performed using the standard protocol during bolus administration of intravenous contrast. Multiplanar CT image reconstructions and MIPs were obtained to evaluate the vascular anatomy. CONTRAST:  80 mL of Isovue 370 IV contrast COMPARISON:  CT of the abdomen and pelvis from 10/29/2014 FINDINGS: Aorta: There is mild aneurysmal dilatation of the descending thoracic and proximal abdominal aorta, measuring 3.5 cm in AP dimension at the level of the diaphragm. This  resolves relatively proximally, above the level of the renal arteries. An aortoiliac stent is noted distal to the renal arteries, and appears fully patent. The celiac trunk, superior mesenteric artery, and bilateral renal arteries appear patent. There appears to be reconstitution of the inferior mesenteric artery from mesenteric branches. There is focal ectasia at the right internal iliac artery, containing mild thrombus. Scattered calcification is noted at the common femoral arteries bilaterally, with mild mural thrombus. Right Lower Extremity: There is scattered calcification along the right common femoral artery, right superficial femoral artery and popliteal artery. Mild luminal narrowing is suggested along the proximal superficial femoral artery. Mild calcification is seen along the branches of the popliteal artery. There is patent 3 vessel runoff to the level of the right lower leg. Left Lower Extremity: There is scattered calcification along the left common femoral artery, left superficial femoral artery and popliteal artery. Mild luminal narrowing is noted along the proximal left superficial femoral artery, and moderate luminal narrowing is seen along the distal left superficial femoral artery. There is mild aneurysmal dilation of the proximal popliteal artery to 1.4 cm, with mild associated mural thrombus. Scattered calcification is seen along the branches of the popliteal artery. Three-vessel runoff is noted to the level of the left lower leg. Diffuse coronary artery calcifications are seen. Mild bibasilar atelectasis is noted. Scattered calcified nodes are noted adjacent to the distal esophagus, and a calcified granuloma is noted at the left lung base. The liver and spleen are unremarkable in appearance. Vaguely increased attenuation within the gallbladder may reflect stones. The gallbladder is otherwise unremarkable. The pancreas and adrenal glands are unremarkable. Numerous large cysts are again noted  about the left kidney. Smaller right renal cysts are seen. Nonspecific perinephric stranding is noted bilaterally. There is no evidence of hydronephrosis. No renal or ureteral stones are seen. No free fluid is identified. The small bowel is unremarkable in appearance. The stomach is within normal limits. The appendix is normal in caliber, without evidence of appendicitis. Scattered diverticulosis is noted along the distal descending and sigmoid colon, without evidence of diverticulitis. The bladder is mildly distended and grossly unremarkable. The patient is status post prostatectomy. No inguinal lymphadenopathy is seen. The lower extremities are grossly unremarkable in appearance. No significant soft tissue edema is seen. The musculature is grossly unremarkable in appearance. No acute osseous abnormalities are identified. No acute osseous abnormalities are identified. Review of the MIP images confirms the above findings. IMPRESSION: 1. No acute occlusion seen. Mild luminal narrowing along the proximal superficial femoral arteries bilaterally, and moderate luminal narrowing along the distal left superficial femoral artery. 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 Scattered calcification within the arterial vasculature of both lower extremities. Three-vessel runoff noted to the lower legs bilaterally, though the vasculature is  not well seen at the ankles. 2. Aortoiliac stent appears fully patent. 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 Scattered calcification along the abdominal aorta and its branches. Recommend followup by ultrasound in 2 years. This recommendation follows ACR consensus guidelines: White Paper of the ACR Incidental Findings Committee II on Vascular Findings. J Am Coll Radiol 2013; 10:789-794. 3. Focal ectasia at the right  internal iliac artery, containing mild thrombus. Scattered calcification of the common femoral arteries bilaterally, with mild mural thrombus. 4. Diffuse coronary artery calcifications seen. 5. Mild bibasilar atelectasis noted. Calcified nodes adjacent to the distal esophagus, reflecting remote granulomatous disease. 6. Vaguely increased attenuation within the gallbladder may reflect stones. Gallbladder otherwise unremarkable. 7. Numerous large left renal cysts.  Smaller right renal cysts. 8. Scattered diverticulosis along the distal descending and sigmoid colon, without evidence of diverticulitis. Electronically Signed   By: Garald Balding M.D.   On: 07/23/2015 22:54   Ct L-spine No Charge  07/23/2015  CLINICAL DATA:  Lower lumbar surgery on Tuesday, slurred speech yesterday, fall from bed this afternoon. Leg weakness. EXAM: CT LUMBAR SPINE WITHOUT CONTRAST TECHNIQUE: Multidetector CT imaging of the lumbar spine was performed without intravenous contrast administration. Multiplanar CT image reconstructions were also generated. COMPARISON:  None. FINDINGS: There are surgical changes of recent left hemi-laminectomies at the L5 and S1 levels. There is expected postsurgical edema and air at the laminectomy sites. No evidence of hematoma or other postsurgical complicating feature. Disc bulges are noted at the L4-5 and L5-S1 levels, causing at least moderate central canal stenosis at the L4-5 level with the additional degenerative facet hypertrophy. There is grossly adequate decompression at the L5-S1 level. There is no acute or suspicious osseous lesion within the lumbar spine or upper sacrum. Atherosclerotic changes are seen along the walls of the normal-caliber abdominal aorta and there are surgical changes of aorta bi-iliac graft which appears patent and stable compared to an earlier CT of 10/29/2014. Multiple cysts are partially imaged within the left kidney, similar in configuration to the CT abdomen of  10/29/2014. No acute- appearing abnormality within the paravertebral soft tissues. IMPRESSION: 1. Surgical changes of recent left hemi-laminectomies at the L5 and S1 levels. Expected postsurgical changes. No evidence of surgical complicating feature. Adequate decompression at the L5-S1 level. 2. Disc bulge at the L4-5 level, combined with degenerative facet hypertrophy, causing moderate central canal stenosis with possible associated nerve root impingements. 3. No acute or suspicious osseous abnormality. No osseous fracture or dislocation. 4. Chronic/incidental findings within the paravertebral soft tissues, as detailed above. Electronically Signed   By: Franki Cabot M.D.   On: 07/23/2015 23:03   Dg Hip Unilat With Pelvis 2-3 Views Left  07/25/2015  CLINICAL DATA:  Fall with left hip pain. EXAM: DG HIP (WITH OR WITHOUT PELVIS) 2-3V LEFT COMPARISON:  05/27/2015 FINDINGS: No acute fracture, subluxation or dislocation identified. Mild degenerative changes within both hips and lower lumbar spine noted. Increased density in both femoral heads may represent AVN. There is no evidence of femoral head flattening. IMPRESSION: No evidence of acute abnormality. 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 No evidence of fracture or flattening. Mild degenerative changes in both hips. Electronically Signed   By: Margarette Canada M.D.   On: 07/25/2015 12:00    Scheduled Meds: . allopurinol  100 mg Oral QHS  . aspirin EC  325 mg Oral Daily  . aztreonam  2 g  Intravenous 3 times per day  . bicalutamide  50 mg Oral QHS  . colchicine  0.6 mg Oral Daily  . docusate sodium  100 mg Oral BID  . fenofibrate  160 mg Oral QPM  . finasteride  5 mg Oral QHS  . loratadine  10 mg Oral Daily  . polyethylene glycol  17 g Oral Daily  . pravastatin  20 mg Oral QPM  . senna  1 tablet Oral Daily  . sodium chloride flush  3 mL Intravenous Q12H  . vancomycin  750 mg Intravenous Q12H   Continuous Infusions: . sodium chloride 100 mL/hr at  07/25/15 0310    Active Problems:   HTN (hypertension)   Coronary artery disease   AKI (acute kidney injury) ([place])   Acute encephalopathy   Falls   Hyponatremia   Hypotension   Status post lumbar surgery    Time spent: 35 minutes.     Niel Hummer A  [facility] Pager [phone]. If 7PM-7AM, please contact night-coverage at [email or web address], password [facility] 07/25/2015, 1:31 PM  LOS: 1 day

## 2015-07-25 NOTE — Progress Notes (Signed)
MRI scan shows normal postoperative changes with no evidence of infection or significant postop hemorrhage.  Patient examined in the room. He has no pain with straight leg raising but does have intense pain with palpation of his left great toe. Patient states that he has a long history of gout and feels like his gout is flaring right now.  Overall I do not feel that there is any significant postoperative infection or hemorrhage responsible for his current state. I do think the patient is having some normal postoperative swelling which is been compounded by a gouty arthritis flare. Recommend treating gout and mobilizing. No indication for surgical reexploration at this point.

## 2015-07-25 NOTE — Progress Notes (Signed)
Pt with IVPB bag in room, bag noted to be labeled azactam with rocephin bottle attached. IVPB already given. Unsure when bag was hung as multiple were in room. Pt currently asleep, no s/s reaction noted. Pharmacy notified and MD notified. No further orders at this time.

## 2015-07-26 ENCOUNTER — Encounter (HOSPITAL_COMMUNITY): Payer: Self-pay | Admitting: General Practice

## 2015-07-26 LAB — PROCALCITONIN: Procalcitonin: 0.42 ng/mL

## 2015-07-26 LAB — CBC
HCT: 27 % — ABNORMAL LOW (ref 39.0–52.0)
Hemoglobin: 8.5 g/dL — ABNORMAL LOW (ref 13.0–17.0)
MCH: 29.6 pg (ref 26.0–34.0)
MCHC: 31.5 g/dL (ref 30.0–36.0)
MCV: 94.1 fL (ref 78.0–100.0)
Platelets: 233 10*3/uL (ref 150–400)
RBC: 2.87 MIL/uL — ABNORMAL LOW (ref 4.22–5.81)
RDW: 13.8 % (ref 11.5–15.5)
WBC: 5.4 10*3/uL (ref 4.0–10.5)

## 2015-07-26 LAB — BASIC METABOLIC PANEL
Anion gap: 13 (ref 5–15)
BUN: 15 mg/dL (ref 6–20)
CO2: 22 mmol/L (ref 22–32)
Calcium: 8.2 mg/dL — ABNORMAL LOW (ref 8.9–10.3)
Chloride: 97 mmol/L — ABNORMAL LOW (ref 101–111)
Creatinine, Ser: 1.25 mg/dL — ABNORMAL HIGH (ref 0.61–1.24)
GFR calc Af Amer: >60 mL/min (ref >60)
GFR calc non Af Amer: 56 mL/min — ABNORMAL LOW (ref >60)
Glucose, Bld: 163 mg/dL — ABNORMAL HIGH (ref 65–99)
Potassium: 4.1 mmol/L (ref 3.5–5.1)
Sodium: 132 mmol/L — ABNORMAL LOW (ref 135–145)

## 2015-07-26 LAB — GLUCOSE, CAPILLARY: Glucose-Capillary: 191 mg/dL — ABNORMAL HIGH (ref 65–99)

## 2015-07-26 MED ORDER — ENSURE ENLIVE PO LIQD
237.0000 mL | Freq: Two times a day (BID) | ORAL | Status: DC
  Administered 2015-07-26 – 2015-07-28 (×5): 237 mL via ORAL

## 2015-07-26 MED ORDER — BISACODYL 10 MG RE SUPP
10.0000 mg | Freq: Every day | RECTAL | Status: DC | PRN
  Administered 2015-07-27: 10 mg via RECTAL
  Filled 2015-07-26: qty 1

## 2015-07-26 NOTE — Care Management Note (Signed)
Case Management Note  Patient Details  Name: Cody Moss MRN: [id number] Date of Birth: 1940-12-25  Subjective/Objective:     Admitted with AKI               Action/Plan: Patient lives at home with spouse, recent laminectomy. Dr Marchelle Folks office called to see if he recommended home health physical therapy post op, his office returned call and stated that he is in agreement for [facility] services at discharge. HHC choice offered, pt/ spouse chose [facility]. Butch Penny with [facility] called for arrangements, also rolling walker ordered.  Expected Discharge Date:    possible 07/27/2015              Expected Discharge Plan:  [place]  Discharge planning Services  CM Consult Choice offered to:  Patient, Spouse  DME Arranged:  Walker rolling DME Agency:  [place]:  PT [facility] Agency:  [place]  Status of Service:  In process, will continue to follow  Sherrilyn Rist [phone] 07/26/2015, 1:57 PM

## 2015-07-26 NOTE — Progress Notes (Signed)
Notified by lab that pt's blood culture x1 is (+), and has gram (+) cocci and cultures.  Physician has been notified.

## 2015-07-26 NOTE — Progress Notes (Signed)
[facility] PROGRESS NOTE  Cody Moss URK:[id number] DOB: June 12, 1940 DOA: 07/23/2015 PCP: [name], MD  Assessment/Plan: Cody Moss is a 75 y.o. male with a recent Lumbar Laminectomy 5 days ago ( Dr [facility]) and history of HTN, Hyperlipidemia PVD, AAA repair who presents to the ED with complains of increased confusion, and weakness, and fall yesterday. He reports having dizziness upon standing, and weakness in both of his legs and his legs not being able to hold him up. His family reports that he had slurring of his speech day prior of admission.   Acute encephalopathy Multifactorial, related to infection, fever, medications, multiples sedatives.  Ammonia 14.  MRI,negative for stroke.  IV fluids. Hold sedatives.  Improved. Back to baseline.   Hypotension, mild fever:  Chest x ray negative, UA negative.  Blood culture; No growth to date.  Lactic acid at 1.8.  Continue with empirically IV antibiotics tx for PNA LFT normal.  Repeated x ray negative, patient report cough.  Incentive spirometry.  Improved.  Could transition to levaquin 4-5.  Anemia; follow trend.  Hb range 9--10.  Hb stable  Constipation; had BM 2 days ago . Continue with miralax.  Suppository PRN.   Gout flare. ; started colchicine. Pain improved.   Acute Renal failure on chronic stage II Cr  On admission at 1.78.  Trending down, continue with IV fluids.  Improved with fluids.   Hyponatremia; Continue with IV fluids.   Lower extremity edema, left; check doppler.   Status post lumbar surgery; Pain Control PRN, neurosurgeon following.  MRI post operative fluids.  PT per neurosurgery   CT finding: Mild luminal narrowing along the proximal superficial femoral arteries bilaterally, and moderate luminal narrowing along the distal left superficial femoral artery. 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  Scattered calcification within the arterial vasculature of both lower extremities. Focal ectasia at the right internal iliac artery, containing mild thrombus. Scattered calcification of the common femoral arteries bilaterally, with mild mural thrombus. 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 Patient to follow up with Dr Scot Dock in one months after his recovery from admission.      Code Status: Full Code.  Family Communication: Care discussed with Wife who was at bedside.  Disposition Plan: Remain inpatient.    Consultants:  Neurosurgery  Vascular Sx phone consult.   Procedures:  none  Antibiotics:  Vancomycin  Aztreonam/   HPI/Subjective: He is feeling well, left toe pain is better. Back pain better.  No BM.  Back to baseline per family   Objective: Filed Vitals:   07/25/15 2053 07/26/15 0208  BP: 129/71 138/61  Pulse: 80 73  Temp: 98.6 F (37 C) 98.6 F (37 C)  Resp: 18     Intake/Output Summary (Last 24 hours) at 07/26/15 1402 Last data filed at 07/26/15 0817  Gross per 24 hour  Intake   2960 ml  Output   2475 ml  Net    485 ml   Filed Weights   07/24/15 0158 07/25/15 0650 07/26/15 0349  Weight: 90.855 kg (200 lb 4.8 oz) 90.311 kg (199 lb 1.6 oz) 90.266 kg (199 lb)    Exam:   General:  Sleepy, no acute distress  Cardiovascular: S 1, S 2 RRR  Respiratory: CTA  Abdomen: BS present, distend.   Musculoskeletal: trace edema  Data Reviewed: Basic Metabolic Panel:  Recent Labs Lab 07/23/15 2032 07/24/15 0330 07/25/15 1154 07/26/15  0510  NA 131* 132* 134* 132*  K 4.4 4.3 4.1 4.1  CL 96* 98* 98* 97*  CO2 '22 23 23 22  '$ GLUCOSE 173* 216* 175* 163*  BUN 24* 22* 16 15  CREATININE 1.78* 1.54* 1.37* 1.25*  CALCIUM 9.5 8.6* 8.8* 8.2*   Liver Function Tests:  Recent Labs Lab 07/23/15 2044  AST 20  ALT 15*  ALKPHOS 28*  BILITOT 0.8  PROT 7.5  ALBUMIN 3.4*   No results for input(s): LIPASE, AMYLASE in the last 168  hours.  Recent Labs Lab 07/23/15 2055  AMMONIA 14   CBC:  Recent Labs Lab 07/23/15 2032 07/24/15 0330 07/25/15 1154 07/26/15 0510  WBC 8.8 6.2 6.6 5.4  HGB 10.7* 8.8* 9.0* 8.5*  HCT 31.0* 27.0* 27.0* 27.0*  MCV 93.4 92.8 93.1 94.1  PLT 247 224 251 233   Cardiac Enzymes: No results for input(s): CKTOTAL, CKMB, CKMBINDEX, TROPONINI in the last 168 hours. BNP (last 3 results) No results for input(s): BNP in the last 8760 hours.  ProBNP (last 3 results) No results for input(s): PROBNP in the last 8760 hours.  CBG:  Recent Labs Lab 07/24/15 0700 07/24/15 1204 07/24/15 1638 07/24/15 2057 07/25/15 2049  GLUCAP 154* 142* 169* 125* 231*    Recent Results (from the past 240 hour(s))  Culture, blood (routine x 2)     Status: None (Preliminary result)   Collection Time: 07/24/15  7:00 AM  Result Value Ref Range Status   Specimen Description LEFT ANTECUBITAL  Final   Special Requests BOTTLES DRAWN AEROBIC AND ANAEROBIC 10CC  Final   Culture NO GROWTH 1 DAY  Final   Report Status PENDING  Incomplete  Culture, blood (routine x 2)     Status: None (Preliminary result)   Collection Time: 07/24/15  7:03 AM  Result Value Ref Range Status   Specimen Description BLOOD RIGHT HAND  Final   Special Requests BOTTLES DRAWN AEROBIC ONLY 5CC  Final   Culture NO GROWTH 1 DAY  Final   Report Status PENDING  Incomplete  MRSA PCR Screening     Status: None   Collection Time: 07/24/15  2:53 PM  Result Value Ref Range Status   MRSA by PCR NEGATIVE NEGATIVE Final    Comment:        The GeneXpert MRSA Assay (FDA approved for NASAL specimens only), is one component of a comprehensive MRSA colonization surveillance program. It is not intended to diagnose MRSA infection nor to guide or monitor treatment for MRSA infections.      Studies: Dg Chest 1 View  07/25/2015  CLINICAL DATA:  Fever following recent fall and back surgery. EXAM: CHEST 1 VIEW COMPARISON:  07/23/2015 FINDINGS:  Upper limits normal heart size identified. There is no evidence of focal airspace disease, pulmonary edema, suspicious pulmonary nodule/mass, pleural effusion, or pneumothorax. No acute bony abnormalities are identified. IMPRESSION: No active disease. Electronically Signed   By: Margarette Canada M.D.   On: 07/25/2015 11:56   Mr Brain Wo Contrast  07/24/2015  CLINICAL DATA:  Confusion.  Recent lumbar discectomy. EXAM: MRI HEAD WITHOUT CONTRAST TECHNIQUE: Multiplanar, multiecho pulse sequences of the brain and surrounding structures were obtained without intravenous contrast. COMPARISON:  Head CT 07/23/2015 and MRI 02/23/2004 FINDINGS: A non expanded partially empty sella is again seen. The position of the cerebellar tonsils is unchanged and within normal limits. There is no evidence of acute infarct, intracranial hemorrhage, mass, midline shift, or extra-axial fluid collection. There is mild generalized  cerebral atrophy. Periventricular white matter T2 hyperintensities are similar to the prior MRI and nonspecific but compatible with mild chronic small vessel ischemic disease. Prior bilateral cataract extraction is noted. Very mild paranasal sinus mucosal thickening is noted with likely mucous retention cysts in the maxillary sinuses. The mastoid air cells are clear. Major intracranial vascular flow voids are preserved. IMPRESSION: 1. No acute intracranial abnormality. 2. Mild chronic small vessel ischemic disease. Electronically Signed   By: Logan Bores M.D.   On: 07/24/2015 16:14   Mr Lumbar Spine Wo Contrast  07/25/2015  CLINICAL DATA:  Left lower extremity weakness. Lumbar laminectomy 07/19/2015 EXAM: MRI LUMBAR SPINE WITHOUT CONTRAST TECHNIQUE: Multiplanar, multisequence MR imaging of the lumbar spine was performed. No intravenous contrast was administered. COMPARISON:  Lumbar MRI 06/05/2015 FINDINGS: Postop laminectomy on the left at L5-S1. Negative for fluid collection. Negative for fracture or mass. Conus  medullaris normal and terminates at L2-3. Severe dilatation left renal pelvis as noted on prior imaging. L1-2:  Mild disc and facet degeneration without spinal stenosis L2-3: Mild disc bulging. Bilateral facet hypertrophy causing mild spinal stenosis. L3-4: Mild disc and facet degeneration without significant spinal stenosis L4-5: Diffuse disc bulging. Moderate facet hypertrophy bilaterally. Mild spinal stenosis. L5-S1: Left laminectomy. Subcutaneous fluid collection measuring 32 x 16 mm with small gas bubbles related to recent surgery. Mild disc space narrowing. Schmorl's node of the right S1 superior endplate is unchanged. Moderate foraminal narrowing bilaterally Epidural soft tissue structure on the left ventral epidural space may represent recurrent disc protrusion or epidural hematoma. This extends caudally behind the S1 vertebral body. This appears to be compressing the left S1 nerve root suggesting this is likely a disc protrusion. Postop tissue thickening considered less likely. Postcontrast may be helpful to differentiate postoperative change from disc protrusion and epidural hematoma. No significant spinal stenosis. IMPRESSION: Postop laminectomy on the left at L5-S1. Subcutaneous fluid collection most likely is a benign postop seroma. CSF collection and infection not excluded. Soft tissue in the ventral epidural space on the left at L5-S1 likely represent recurrent disc protrusion. Differential includes epidural hematoma and postop edema. Postcontrast imaging may be helpful for differentiation. Electronically Signed   By: Franchot Gallo M.D.   On: 07/25/2015 11:12   Dg Hip Unilat With Pelvis 2-3 Views Left  07/25/2015  CLINICAL DATA:  Fall with left hip pain. EXAM: DG HIP (WITH OR WITHOUT PELVIS) 2-3V LEFT COMPARISON:  05/27/2015 FINDINGS: No acute fracture, subluxation or dislocation identified. Mild degenerative changes within both hips and lower lumbar spine noted. Increased density in both femoral  heads may represent AVN. There is no evidence of femoral head flattening. IMPRESSION: No evidence of acute abnormality. 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 No evidence of fracture or flattening. Mild degenerative changes in both hips. Electronically Signed   By: Margarette Canada M.D.   On: 07/25/2015 12:00    Scheduled Meds: . allopurinol  100 mg Oral QHS  . aspirin EC  325 mg Oral Daily  . aztreonam  2 g Intravenous 3 times per day  . bicalutamide  50 mg Oral QHS  . colchicine  0.6 mg Oral Daily  . docusate sodium  100 mg Oral BID  . fenofibrate  160 mg Oral QPM  . finasteride  5 mg Oral QHS  . loratadine  10 mg Oral Daily  . polyethylene glycol  17 g Oral Daily  . pravastatin  20 mg Oral QPM  . senna  1 tablet Oral Daily  . sodium  chloride flush  3 mL Intravenous Q12H  . vancomycin  750 mg Intravenous Q12H   Continuous Infusions: . sodium chloride 100 mL/hr at 07/25/15 0310    Active Problems:   HTN (hypertension)   Coronary artery disease   AKI (acute kidney injury) ([place])   Acute encephalopathy   Falls   Hyponatremia   Hypotension   Status post lumbar surgery    Time spent: 35 minutes.     Niel Hummer A  [facility] Pager [phone]. If 7PM-7AM, please contact night-coverage at [email or web address], password [facility] 07/26/2015, 2:02 PM  LOS: 2 days

## 2015-07-26 NOTE — Evaluation (Signed)
Occupational Therapy Evaluation Patient Details Name: Cody Moss MRN: [id number] DOB: [birth year] Today's Date: 07/26/2015    History of Present Illness 75 y.o. male with a recent Lumbar Laminectomy 5 days ago ( Dr [facility]) and history of HTN, Hyperlipidemia PVD, AAA repair who presents to the ED with complains of increased confusion, and weakness, and fall 2 days ago   Clinical Impression   Pt admitted with above and presents to OT with deficits impacting functional mobility and independence with ADLs.  Re-educated on back precautions with patient and family and implications with self-care tasks.  Educated on AE to assist with LB bathing and dressing to ensure adherence to back precautions.  Pt demonstrated use of sock aid when donning Rt sock, but deferred donning Lt due to gout in foot.      Follow Up Recommendations  Home health OT;Supervision - Intermittent    Equipment Recommendations  None recommended by OT    Recommendations for Other Services       Precautions / Restrictions Precautions Precautions: Back;Fall Precaution Booklet Issued: Yes (comment) Precaution Comments: reviewed precautions Restrictions Weight Bearing Restrictions: No      Mobility Bed Mobility               General bed mobility comments: Extensive education performed with pt and family on logroll technique.   Transfers Overall transfer level: Needs assistance Equipment used: Rolling walker (2 wheeled) Transfers: Sit to/from Stand Sit to Stand: Min guard         General transfer comment: cues for hand placement    Balance Overall balance assessment: Needs assistance Sitting-balance support: No upper extremity supported Sitting balance-Leahy Scale: Good     Standing balance support: Bilateral upper extremity supported Standing balance-Leahy Scale: Poor Standing balance comment: using rw for support                            ADL Overall ADL's : Needs  assistance/impaired     Grooming: Set up;Min guard;Standing   Upper Body Bathing: Set up;Min guard;Sitting   Lower Body Bathing: Total assistance;Sit to/from stand (due to back precautions) Lower Body Bathing Details (indicate cue type and reason): nurse tech bathing pt upon arrival and pt requested she finish prior to engaging in OT eval/session     Lower Body Dressing: Total assistance Lower Body Dressing Details (indicate cue type and reason): Total assist without AE, donned hospital socks with sock aid on Rt deferred Lt due to gout pain Toilet Transfer: Min guard;Ambulation;RW           Functional mobility during ADLs: Min guard;Rolling walker       Vision Vision Assessment?: No apparent visual deficits          Pertinent Vitals/Pain Pain Assessment: Faces Faces Pain Scale: Hurts a little bit Pain Location: back Pain Descriptors / Indicators: Guarding Pain Intervention(s): Limited activity within patient's tolerance;Monitored during session     Hand Dominance Right   Extremity/Trunk Assessment Upper Extremity Assessment Upper Extremity Assessment: Overall WFL for tasks assessed   Lower Extremity Assessment Lower Extremity Assessment: Defer to PT evaluation       Communication Communication Communication: No difficulties   Cognition Arousal/Alertness: Awake/alert Behavior During Therapy: WFL for tasks assessed/performed Overall Cognitive Status: Within Functional Limits for tasks assessed  Home Living Family/patient expects to be discharged to:: Private residence Living Arrangements: Spouse/significant other Available Help at Discharge: Family Type of Home: Mobile home Home Access: Ramped entrance     [place]: One level     Bathroom Shower/Tub: Occupational psychologist: [place]: Grab bars - toilet;Shower seat - built in          Prior Functioning/Environment Level  of Independence: Independent        Comments: independent prior to back surgery and working    OT Diagnosis: Generalized weakness;Acute pain   OT Problem List: Decreased range of motion;Decreased activity tolerance;Impaired balance (sitting and/or standing);Decreased knowledge of use of DME or AE;Decreased knowledge of precautions;Pain   OT Treatment/Interventions: Self-care/ADL training;DME and/or AE instruction;Patient/family education;Balance training    OT Goals(Current goals can be found in the care plan section) Acute Rehab OT Goals Patient Stated Goal: to go home OT Goal Formulation: With patient Time For Goal Achievement: 08/09/15 Potential to Achieve Goals: Good  OT Frequency: Min 2X/week    End of Session Equipment Utilized During Treatment: Rolling walker  Activity Tolerance: Patient tolerated treatment well;No increased pain Patient left: in bed;with call bell/phone within reach;with nursing/sitter in room;with family/visitor present   Time: 8786-7672 OT Time Calculation (min): 26 min Charges:  OT General Charges $OT Visit: 1 Procedure OT Evaluation $OT Eval Moderate Complexity: 1 Procedure OT Treatments $Self Care/Home Management : 8-22 mins G-CodesSimonne Come, [phone] 07/26/2015, 12:27 PM

## 2015-07-26 NOTE — Progress Notes (Signed)
Physical Therapy Treatment Patient Details Name: Cody Moss MRN: [id number] DOB: January 02, 1941 Today's Date: 07/26/2015    History of Present Illness 75 y.o. male with a recent Lumbar Laminectomy 5 days ago ( Dr [facility]) and history of HTN, Hyperlipidemia PVD, AAA repair who presents to the ED with complains of increased confusion, and weakness, and fall yesterday    PT Comments    Pt with improving mobility with increased ambulation to 110 ft with rw and min guard assistance. Extensive education performed with pt and family on precautions as well as bed mobility. PT to continue to follow and progress as tolerated.   Follow Up Recommendations  Home health PT;Supervision for mobility/OOB     Equipment Recommendations  Rolling walker with 5" wheels    Recommendations for Other Services       Precautions / Restrictions Precautions Precautions: Back;Fall Precaution Comments: reviewed precautions Restrictions Weight Bearing Restrictions: No    Mobility  Bed Mobility               General bed mobility comments: Extensive education performed with pt and family on logroll technique.   Transfers Overall transfer level: Needs assistance Equipment used: Rolling walker (2 wheeled) Transfers: Sit to/from Stand Sit to Stand: Min guard         General transfer comment: cues for hand placement  Ambulation/Gait Ambulation/Gait assistance: Min guard Ambulation Distance (Feet): 110 Feet Assistive device: Rolling walker (2 wheeled) Gait Pattern/deviations: Decreased step length - right;Decreased step length - left     General Gait Details: steady pattern, no loss of balance, slow cadence.    Stairs            Wheelchair Mobility    Modified Rankin (Stroke Patients Only)       Balance Overall balance assessment: Needs assistance Sitting-balance support: No upper extremity supported Sitting balance-Leahy Scale: Good     Standing balance  support: Bilateral upper extremity supported Standing balance-Leahy Scale: Poor Standing balance comment: using rw for support                    Cognition Arousal/Alertness: Awake/alert Behavior During Therapy: WFL for tasks assessed/performed Overall Cognitive Status: Within Functional Limits for tasks assessed                      Exercises      General Comments        Pertinent Vitals/Pain Pain Assessment: Faces Faces Pain Scale: Hurts little more Pain Location: back Pain Descriptors / Indicators: Guarding Pain Intervention(s): Limited activity within patient's tolerance;Monitored during session    Home Living                      Prior Function            PT Goals (current goals can now be found in the care plan section) Acute Rehab PT Goals Patient Stated Goal: to go home PT Goal Formulation: With patient/family Time For Goal Achievement: 08/08/15 Potential to Achieve Goals: Fair Progress towards PT goals: Progressing toward goals    Frequency  Min 3X/week    PT Plan Current plan remains appropriate    Co-evaluation             End of Session Equipment Utilized During Treatment: Gait belt Activity Tolerance: Patient tolerated treatment well Patient left: in chair;with call bell/phone within reach;with family/visitor present     Time: 3244-0102 PT Time Calculation (min) (ACUTE ONLY):  34 min  Charges:  $Gait Training: 8-22 mins $Therapeutic Activity: 8-22 mins                    G Codes:      Cassell Clement, PT, CSCS Pager [phone] Office [phone]  07/26/2015, 10:52 AM

## 2015-07-27 ENCOUNTER — Inpatient Hospital Stay (HOSPITAL_COMMUNITY): Payer: Medicare Other

## 2015-07-27 DIAGNOSIS — R609 Edema, unspecified: Secondary | ICD-10-CM

## 2015-07-27 DIAGNOSIS — Z9889 Other specified postprocedural states: Secondary | ICD-10-CM

## 2015-07-27 DIAGNOSIS — W19XXXA Unspecified fall, initial encounter: Secondary | ICD-10-CM

## 2015-07-27 DIAGNOSIS — N179 Acute kidney failure, unspecified: Principal | ICD-10-CM

## 2015-07-27 DIAGNOSIS — G9340 Encephalopathy, unspecified: Secondary | ICD-10-CM

## 2015-07-27 DIAGNOSIS — E871 Hypo-osmolality and hyponatremia: Secondary | ICD-10-CM

## 2015-07-27 DIAGNOSIS — M1000 Idiopathic gout, unspecified site: Secondary | ICD-10-CM

## 2015-07-27 LAB — GLUCOSE, CAPILLARY
Glucose-Capillary: 195 mg/dL — ABNORMAL HIGH (ref 65–99)
Glucose-Capillary: 109 mg/dL — ABNORMAL HIGH (ref 65–99)
Glucose-Capillary: 225 mg/dL — ABNORMAL HIGH (ref 65–99)

## 2015-07-27 MED ORDER — DOXYCYCLINE HYCLATE 100 MG PO TABS
100.0000 mg | ORAL_TABLET | Freq: Two times a day (BID) | ORAL | Status: DC
  Administered 2015-07-27 – 2015-07-28 (×2): 100 mg via ORAL
  Filled 2015-07-27 (×2): qty 1

## 2015-07-27 MED ORDER — INSULIN ASPART 100 UNIT/ML ~~LOC~~ SOLN
0.0000 [IU] | Freq: Three times a day (TID) | SUBCUTANEOUS | Status: DC
  Administered 2015-07-28: 7 [IU] via SUBCUTANEOUS
  Administered 2015-07-28: 2 [IU] via SUBCUTANEOUS

## 2015-07-27 MED ORDER — BISACODYL 10 MG RE SUPP
10.0000 mg | Freq: Every day | RECTAL | Status: DC
  Administered 2015-07-28: 10 mg via RECTAL
  Filled 2015-07-27: qty 1

## 2015-07-27 MED ORDER — PREDNISONE 50 MG PO TABS
50.0000 mg | ORAL_TABLET | Freq: Every day | ORAL | Status: DC
  Administered 2015-07-28: 50 mg via ORAL
  Filled 2015-07-27: qty 1

## 2015-07-27 MED ORDER — METHYLPREDNISOLONE SODIUM SUCC 125 MG IJ SOLR
60.0000 mg | Freq: Once | INTRAMUSCULAR | Status: AC
  Administered 2015-07-27: 60 mg via INTRAVENOUS
  Filled 2015-07-27: qty 2

## 2015-07-27 MED ORDER — FLUTICASONE PROPIONATE 50 MCG/ACT NA SUSP
2.0000 | Freq: Every day | NASAL | Status: DC
  Administered 2015-07-28: 2 via NASAL
  Filled 2015-07-27: qty 16

## 2015-07-27 MED ORDER — LACTULOSE 10 GM/15ML PO SOLN
10.0000 g | Freq: Two times a day (BID) | ORAL | Status: AC
  Administered 2015-07-27 (×2): 10 g via ORAL
  Filled 2015-07-27 (×2): qty 15

## 2015-07-27 NOTE — Progress Notes (Signed)
*  Preliminary Results* Bilateral lower extremity venous duplex completed. Bilateral lower extremities are negative for deep vein thrombosis. There is no evidence of Baker's cyst bilaterally.  07/27/2015  Maudry Mayhew, RVT, RDCS, RDMS

## 2015-07-27 NOTE — Progress Notes (Signed)
[facility] PROGRESS NOTE  Cody Moss CHY:[id number] DOB: 04-13-1941 DOA: 07/23/2015 PCP: [name], MD  Assessment/Plan: Cody Moss is a 75 y.o. male with a recent Lumbar Laminectomy 5 days ago ( Dr [facility]) and history of HTN, Hyperlipidemia PVD, AAA repair who presents to the ED with complains of increased confusion, and weakness, and fall yesterday. He reports having dizziness upon standing, and weakness in both of his legs and his legs not being able to hold him up. His family reports that he had slurring of his speech day prior of admission.     Acute encephalopathy Multifactorial, related to infection, fever, medications, multiples sedatives.  Ammonia 14.  MRI,negative for stroke.  IV fluids. Hold sedatives.  Improved. Back to baseline.   Hypotension, mild fever:  (fever could be from gout as well, hypotension could be from sedation meds) Chest x ray negative, UA negative. Blood culture; No growth to date.  (1/2 with coag negative staph , liklye contamination) He received vanc/aztreonam since admission, d/c on 4/5 LFT normal.  Repeated x ray negative, patient report cough. Does has h/o sinusitis, c/o nasal congestion, will change abx to doxcycline, start flonase.  Gout flare with left toe swollen, erythema and tender, will check uric acid, continue allopurinol, colchicine, start steroids.   Anemia; follow trend.  Hb range 9--10.  Hb stable  Constipation; had BM 2 days ago . Continue with miralax.  Suppository daily, still no bm, refuse enema, will try lactulose  Gout flare. ; started colchicine. Pain improved.   Acute Renal failure on chronic stage II Cr  On admission at 1.78.  ua no infection, no evidence of hydronephrosis on CTa abdomen, does has mild bladder distension, will get bladder scan Trending down, continue with IV fluids.  Improved with fluids.   Hyponatremia; Continue with IV fluids.   Lower extremity edema, left; no  dvt, possible related to gout  Status post lumbar surgery; Pain Control PRN, neurosurgeon following.  MRI post operative fluids.  PT per neurosurgery   CTA ab and lower extremity finding: Mild luminal narrowing along the proximal superficial femoral arteries bilaterally, and moderate luminal narrowing along the distal left superficial femoral artery. 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 Scattered calcification within the arterial vasculature of both lower extremities. Focal ectasia at the right internal iliac artery, containing mild thrombus. Scattered calcification of the common femoral arteries bilaterally, with mild mural thrombus. 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 Patient to follow up with Dr Scot Dock in one months after his recovery from admission.   Bilateral hip pain, CTA lower extremity no significant soft tissue or osseous abnormalities, bilateral hip x ray with / bilateral femoral head abc and mild degenerative changes in both hips. Will refer to ortho outpatient follow up    Code Status: Full Code.  Family Communication: Care discussed with Wife and daughter who are at bedside.  Disposition Plan: Remain inpatient.    Consultants:  Neurosurgery  Vascular Sx phone consult.   Procedures:  none  Antibiotics:  Vancomycin from admission to 4/5  Aztreonam from admission to 4/5  HPI/Subjective: Patient is sleepy, family states he just got pain meds, patient is c/o bilateral hip pain and left toe pain with gouty flare, does has dry cough,  Back pain better, still no BM.  Mental status per family, Back to baseline per family   Objective: Filed Vitals:   07/26/15 2006 07/27/15 8786  BP: 103/50 137/59  Pulse: 79 79  Temp: 98.3 F (36.8 C) 98.6 F (37 C)  Resp: 18 18    Intake/Output Summary (Last 24 hours) at 07/27/15 0810 Last data filed at 07/27/15 0700  Gross per 24  hour  Intake 3636.25 ml  Output   1800 ml  Net 1836.25 ml   Filed Weights   07/25/15 0650 07/26/15 0349 07/27/15 0346  Weight: 90.311 kg (199 lb 1.6 oz) 90.266 kg (199 lb) 90.084 kg (198 lb 9.6 oz)    Exam:   General:  Sleepy, no acute distress  Cardiovascular: S 1, S 2 RRR  Respiratory: CTA  Abdomen: BS present, distend.   Musculoskeletal: trace edema  Data Reviewed: Basic Metabolic Panel:  Recent Labs Lab 07/23/15 2032 07/24/15 0330 07/25/15 1154 07/26/15 0510  NA 131* 132* 134* 132*  K 4.4 4.3 4.1 4.1  CL 96* 98* 98* 97*  CO2 '22 23 23 22  '$ GLUCOSE 173* 216* 175* 163*  BUN 24* 22* 16 15  CREATININE 1.78* 1.54* 1.37* 1.25*  CALCIUM 9.5 8.6* 8.8* 8.2*   Liver Function Tests:  Recent Labs Lab 07/23/15 2044  AST 20  ALT 15*  ALKPHOS 28*  BILITOT 0.8  PROT 7.5  ALBUMIN 3.4*   No results for input(s): LIPASE, AMYLASE in the last 168 hours.  Recent Labs Lab 07/23/15 2055  AMMONIA 14   CBC:  Recent Labs Lab 07/23/15 2032 07/24/15 0330 07/25/15 1154 07/26/15 0510  WBC 8.8 6.2 6.6 5.4  HGB 10.7* 8.8* 9.0* 8.5*  HCT 31.0* 27.0* 27.0* 27.0*  MCV 93.4 92.8 93.1 94.1  PLT 247 224 251 233   Cardiac Enzymes: No results for input(s): CKTOTAL, CKMB, CKMBINDEX, TROPONINI in the last 168 hours. BNP (last 3 results) No results for input(s): BNP in the last 8760 hours.  ProBNP (last 3 results) No results for input(s): PROBNP in the last 8760 hours.  CBG:  Recent Labs Lab 07/24/15 1204 07/24/15 1638 07/24/15 2057 07/25/15 2049 07/26/15 2014  GLUCAP 142* 169* 125* 231* 191*    Recent Results (from the past 240 hour(s))  Culture, blood (routine x 2)     Status: None (Preliminary result)   Collection Time: 07/24/15  7:00 AM  Result Value Ref Range Status   Specimen Description BLOOD LEFT ANTECUBITAL  Final   Special Requests BOTTLES DRAWN AEROBIC AND ANAEROBIC 10CC  Final   Culture  Setup Time   Final    GRAM POSITIVE COCCI IN  CLUSTERS ANAEROBIC BOTTLE ONLY CRITICAL RESULT CALLED TO, READ BACK BY AND VERIFIED WITH: Jaclyn Shaggy RN 14:35 07/26/15 ([email or web address])    Culture NO GROWTH 1 DAY  Final   Report Status PENDING  Incomplete  Culture, blood (routine x 2)     Status: None (Preliminary result)   Collection Time: 07/24/15  7:03 AM  Result Value Ref Range Status   Specimen Description BLOOD RIGHT HAND  Final   Special Requests BOTTLES DRAWN AEROBIC ONLY 5CC  Final   Culture NO GROWTH 2 DAYS  Final   Report Status PENDING  Incomplete  MRSA PCR Screening     Status: None   Collection Time: 07/24/15  2:53 PM  Result Value Ref Range Status   MRSA by PCR NEGATIVE NEGATIVE Final    Comment:        The GeneXpert MRSA Assay (FDA approved for NASAL specimens only), is one component of a comprehensive MRSA colonization surveillance program. It is not intended to diagnose MRSA infection nor  to guide or monitor treatment for MRSA infections.      Studies: Dg Chest 1 View  07/25/2015  CLINICAL DATA:  Fever following recent fall and back surgery. EXAM: CHEST 1 VIEW COMPARISON:  07/23/2015 FINDINGS: Upper limits normal heart size identified. There is no evidence of focal airspace disease, pulmonary edema, suspicious pulmonary nodule/mass, pleural effusion, or pneumothorax. No acute bony abnormalities are identified. IMPRESSION: No active disease. Electronically Signed   By: Margarette Canada M.D.   On: 07/25/2015 11:56   Mr Lumbar Spine Wo Contrast  07/25/2015  CLINICAL DATA:  Left lower extremity weakness. Lumbar laminectomy 07/19/2015 EXAM: MRI LUMBAR SPINE WITHOUT CONTRAST TECHNIQUE: Multiplanar, multisequence MR imaging of the lumbar spine was performed. No intravenous contrast was administered. COMPARISON:  Lumbar MRI 06/05/2015 FINDINGS: Postop laminectomy on the left at L5-S1. Negative for fluid collection. Negative for fracture or mass. Conus medullaris normal and terminates at L2-3. Severe dilatation left renal pelvis as noted  on prior imaging. L1-2:  Mild disc and facet degeneration without spinal stenosis L2-3: Mild disc bulging. Bilateral facet hypertrophy causing mild spinal stenosis. L3-4: Mild disc and facet degeneration without significant spinal stenosis L4-5: Diffuse disc bulging. Moderate facet hypertrophy bilaterally. Mild spinal stenosis. L5-S1: Left laminectomy. Subcutaneous fluid collection measuring 32 x 16 mm with small gas bubbles related to recent surgery. Mild disc space narrowing. Schmorl's node of the right S1 superior endplate is unchanged. Moderate foraminal narrowing bilaterally Epidural soft tissue structure on the left ventral epidural space may represent recurrent disc protrusion or epidural hematoma. This extends caudally behind the S1 vertebral body. This appears to be compressing the left S1 nerve root suggesting this is likely a disc protrusion. Postop tissue thickening considered less likely. Postcontrast may be helpful to differentiate postoperative change from disc protrusion and epidural hematoma. No significant spinal stenosis. IMPRESSION: Postop laminectomy on the left at L5-S1. Subcutaneous fluid collection most likely is a benign postop seroma. CSF collection and infection not excluded. Soft tissue in the ventral epidural space on the left at L5-S1 likely represent recurrent disc protrusion. Differential includes epidural hematoma and postop edema. Postcontrast imaging may be helpful for differentiation. Electronically Signed   By: Franchot Gallo M.D.   On: 07/25/2015 11:12   Dg Hip Unilat With Pelvis 2-3 Views Left  07/25/2015  CLINICAL DATA:  Fall with left hip pain. EXAM: DG HIP (WITH OR WITHOUT PELVIS) 2-3V LEFT COMPARISON:  05/27/2015 FINDINGS: No acute fracture, subluxation or dislocation identified. Mild degenerative changes within both hips and lower lumbar spine noted. Increased density in both femoral heads may represent AVN. There is no evidence of femoral head flattening. IMPRESSION: No  evidence of acute abnormality. 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 No evidence of fracture or flattening. Mild degenerative changes in both hips. Electronically Signed   By: Margarette Canada M.D.   On: 07/25/2015 12:00    Scheduled Meds: . allopurinol  100 mg Oral QHS  . aspirin EC  325 mg Oral Daily  . aztreonam  2 g Intravenous 3 times per day  . bicalutamide  50 mg Oral QHS  . colchicine  0.6 mg Oral Daily  . docusate sodium  100 mg Oral BID  . feeding supplement (ENSURE ENLIVE)  237 mL Oral BID BM  . fenofibrate  160 mg Oral QPM  . finasteride  5 mg Oral QHS  . loratadine  10 mg Oral Daily  . polyethylene glycol  17 g Oral Daily  . pravastatin  20 mg Oral QPM  .  senna  1 tablet Oral Daily  . sodium chloride flush  3 mL Intravenous Q12H  . vancomycin  750 mg Intravenous Q12H   Continuous Infusions: . sodium chloride 75 mL/hr (07/27/15 0550)    Active Problems:   HTN (hypertension)   Coronary artery disease   AKI (acute kidney injury) ([place])   Acute encephalopathy   Falls   Hyponatremia   Hypotension   Status post lumbar surgery    Time spent: 35 minutes.     Rieley Hausman MD PhD  [facility] Pager [phone]. If 7PM-7AM, please contact night-coverage at [email or web address], password [facility] 07/27/2015, 8:10 AM  LOS: 3 days

## 2015-07-27 NOTE — Progress Notes (Signed)
Overall pain better.  Still mostly pain in lumbosacral region and bilateral lateral hips (Left>right).  No fevers. Motor and sens exam intact. Wound looks good to me -- no evidence of infection.  I still think the most likely explanation of his situation is  -- Postop inflamation/healing complicated by gouty arthritis flare.  Ok to mobilize. [place] for [employer] home with PT.

## 2015-07-28 ENCOUNTER — Other Ambulatory Visit: Payer: Self-pay

## 2015-07-28 ENCOUNTER — Inpatient Hospital Stay (HOSPITAL_COMMUNITY): Payer: Medicare Other

## 2015-07-28 DIAGNOSIS — I10 Essential (primary) hypertension: Secondary | ICD-10-CM

## 2015-07-28 DIAGNOSIS — I509 Heart failure, unspecified: Secondary | ICD-10-CM

## 2015-07-28 DIAGNOSIS — K5909 Other constipation: Secondary | ICD-10-CM

## 2015-07-28 LAB — BASIC METABOLIC PANEL
Anion gap: 12 (ref 5–15)
BUN: 20 mg/dL (ref 6–20)
CO2: 25 mmol/L (ref 22–32)
Calcium: 8.9 mg/dL (ref 8.9–10.3)
Chloride: 100 mmol/L — ABNORMAL LOW (ref 101–111)
Creatinine, Ser: 1.3 mg/dL — ABNORMAL HIGH (ref 0.61–1.24)
GFR calc Af Amer: >60 mL/min (ref >60)
GFR calc non Af Amer: 53 mL/min — ABNORMAL LOW (ref >60)
Glucose, Bld: 239 mg/dL — ABNORMAL HIGH (ref 65–99)
Potassium: 4.6 mmol/L (ref 3.5–5.1)
Sodium: 137 mmol/L (ref 135–145)

## 2015-07-28 LAB — CBC
HCT: 28.5 % — ABNORMAL LOW (ref 39.0–52.0)
Hemoglobin: 9.1 g/dL — ABNORMAL LOW (ref 13.0–17.0)
MCH: 29.7 pg (ref 26.0–34.0)
MCHC: 31.9 g/dL (ref 30.0–36.0)
MCV: 93.1 fL (ref 78.0–100.0)
Platelets: 327 10*3/uL (ref 150–400)
RBC: 3.06 MIL/uL — ABNORMAL LOW (ref 4.22–5.81)
RDW: 13.6 % (ref 11.5–15.5)
WBC: 5.6 10*3/uL (ref 4.0–10.5)

## 2015-07-28 LAB — IRON AND TIBC
Iron: 21 ug/dL — ABNORMAL LOW (ref 45–182)
Saturation Ratios: 7 % — ABNORMAL LOW (ref 17.9–39.5)
TIBC: 286 ug/dL (ref 250–450)
UIBC: 265 ug/dL

## 2015-07-28 LAB — RETICULOCYTES
RBC.: 3.06 MIL/uL — ABNORMAL LOW (ref 4.22–5.81)
Retic Count, Absolute: 36.7 10*3/uL (ref 19.0–186.0)
Retic Ct Pct: 1.2 % (ref 0.4–3.1)

## 2015-07-28 LAB — VITAMIN B12: Vitamin B-12: 306 pg/mL (ref 180–914)

## 2015-07-28 LAB — GLUCOSE, CAPILLARY
Glucose-Capillary: 196 mg/dL — ABNORMAL HIGH (ref 65–99)
Glucose-Capillary: 317 mg/dL — ABNORMAL HIGH (ref 65–99)

## 2015-07-28 LAB — URIC ACID: Uric Acid, Serum: 3.3 mg/dL — ABNORMAL LOW (ref 4.4–7.6)

## 2015-07-28 LAB — FOLATE: Folate: 13.8 ng/mL (ref >5.9)

## 2015-07-28 LAB — MAGNESIUM: Magnesium: 2.3 mg/dL (ref 1.7–2.4)

## 2015-07-28 LAB — TSH: TSH: 0.527 u[IU]/mL (ref 0.350–4.500)

## 2015-07-28 MED ORDER — ENSURE ENLIVE PO LIQD: 237.0000 mL | Freq: Two times a day (BID) | ORAL | Status: DC

## 2015-07-28 MED ORDER — METFORMIN HCL 1000 MG PO TABS: 500.0000 mg | ORAL_TABLET | Freq: Two times a day (BID) | ORAL | Status: AC

## 2015-07-28 MED ORDER — PREDNISONE 10 MG PO TABS: ORAL_TABLET | ORAL | Status: DC

## 2015-07-28 MED ORDER — HYDROCODONE-ACETAMINOPHEN 10-325 MG PO TABS: 1.0000 | ORAL_TABLET | Freq: Four times a day (QID) | ORAL | Status: DC | PRN

## 2015-07-28 MED ORDER — MINERAL OIL RE ENEM
1.0000 | ENEMA | Freq: Once | RECTAL | Status: AC
  Administered 2015-07-28: 1 via RECTAL
  Filled 2015-07-28 (×2): qty 1

## 2015-07-28 NOTE — Consult Note (Signed)
   [facility] Inpatient Consult   07/28/2015  Cody Moss 08-16-1940 [id number] Patient evaluated for community based chronic disease management services with [place] [facility] as a benefit of patient's [facility]. Spoke with patient, wife, [name], and daughter at bedside to explain [name] [facility] services. Patient endorses Dr. [name] as his primary care provider.  He verbalize interest in some post hospital follow up when he returns home.  He states he ahs been active with [place] in the past for therapy.  Patient will receive post hospital discharge call and will be evaluated for monthly home visits for assessments and disease process education.  Left contact information and [facility] literature at bedside. Made Inpatient Case Manager aware that [place] Management following. Of note, [facility] Care Management services does not replace or interfere with any services that are arranged by inpatient case management or social work.  For additional questions or referrals please contact:   Natividad Brood, RN BSN [facility] Liaison  [phone] business mobile phone Toll free office [phone]

## 2015-07-28 NOTE — Discharge Summary (Addendum)
Discharge Summary  Cody Moss LKG:[id number] DOB: 1940-12-08  PCP: [name], MD  Admit date: 07/23/2015 Discharge date: 07/28/2015  Time spent: >66mns  Recommendations for Outpatient Follow-up:  1. F/u with PMD within a week  for hospital discharge follow up, repeat cbc/bmp at follow up 2. F/u with vascular surgery Dr DScot Dockfor PVD, please refer to CTA ao+bifem done on 07/23/2015 3. F/u with [facility] for bilateral hip pain  Discharge Diagnoses:  Active Hospital Problems   Diagnosis Date Noted  . AKI (acute kidney injury) ([place] 07/24/2015  . Acute encephalopathy 07/24/2015  . Falls 07/24/2015  . Hyponatremia 07/24/2015  . Hypotension 07/24/2015  . Status post lumbar surgery 07/24/2015  . Coronary artery disease   . HTN (hypertension) 05/06/2013    Resolved Hospital Problems   Diagnosis Date Noted Date Resolved  No resolved problems to display.    Discharge Condition: stable  Diet recommendation: heart healthy/carb modified  Filed Weights   07/26/15 0349 07/27/15 0346 07/28/15 0420  Weight: 90.266 kg (199 lb) 90.084 kg (198 lb 9.6 oz) 89.585 kg (197 lb 8 oz)    History of present illness:  Cody BIDINGERis a 75y.o. male with a recent Lumbar Laminectomy 5 days ago ( Dr [facility]renton [facility]monNeurosurgery) and history of HTN, Hyperlipidemia PVD, AAA repair who presents to the ED with complains of increased confusion, and weakness, and fall yesterday. He reports having dizziness upon standing, and weakness in both of his legs and his legs not being able to hold him up. His family reports that he had slurring of his speech yesterday. He went home on Oxycodone/APAP 5/325 for pain but his pain was better controlled with Hydrocodone/APAP so the pain medication was changed to Hydrocodone APAP 10/325. He was found to have an elevation in his BUN/Cr to 24/1.78 and his previous creatinine level had been 1.0. A Ct scan of the head,and m and imaging of his Lumbar  Spine, and was performed an were negative for acute pathology. He was administered 2 Liters of IV Fluids in the ED and his blood pressures improved and he was referred for admission.  Hospital Course:  Active Problems:   HTN (hypertension)   Coronary artery disease   AKI (acute kidney injury) ([place]   Acute encephalopathy   Falls   Hyponatremia   Hypotension   Status post lumbar surgery  Acute encephalopathy, likely from narcotics and sedatives after surgery No infection, Ammonia 14.  MRI,negative for stroke.  IV fluids. Hold sedatives.  Improved. Back to baseline.   Hypotension, mild fever: (fever could be from gout as well, hypotension could be from sedation meds) Chest x ray negative, UA negative. Blood culture; No growth to date. (1/2 with coag negative staph , liklye contamination) He received vanc/aztreonam since admission, d/c on 4/5 LFT normal.  Repeated x ray negative, patient report cough. Does has h/o sinusitis, c/o nasal congestion, will change abx to doxcycline, start flonase. His home blood pressure has been held since admission, restarted lopressor at '25mg'$  bid, discontinued benicar/hct.  Gout flare with left toe swollen, erythema and tender,  uric acid actually low, continue allopurinol, colchicine, start steroids. Much improved at discharge.  Anemia; anemia work up revealed likely combination of iron deficiency and anemia of chronic disease,  Hb stable range 9--10.  Consider start ferrous sulfate every other day after constipation resolved, will defer to pmd to start iron supplement.   Constipation; had BM 2 days ago . Continue with miralax.  Suppository daily, still  no bm, refuse enema, will try lactulose Patient finally agreed to enema, he is passing gas, no ab pain, no n/v, patient and family want to go home to continue stool softener and ambulate more.  Gout flare. ; started colchicine. Pain improved.   Acute Renal failure on chronic stage II Cr  On admission at 1.78.Trending down, seems stabilized around 1.2 to 1.3  ua no infection, no evidence of hydronephrosis on CTa abdomen, does has mild bladder distension,  bladder scan with 221cc, patient reports he is closed followed up with urology as outpatient.  Due to persistent mild elevated cr, metformin dose decreased, may consider discontinue metformin if cr remain elevated. i have discussed this with the patient and family. Benicar/hctz discontinued.    Hyponatremia; normalized with IV fluids.   Lower extremity edema, left; no dvt, possible related to gout  Status post lumbar surgery; Pain Control PRN, neurosurgeon following.  MRI post operative fluids.  PT per neurosurgery   CTA Ao+bifem on 4/1: Mild luminal narrowing along the proximal superficial femoral arteries bilaterally, and moderate luminal narrowing along the distal left superficial femoral artery. 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 Scattered calcification within the arterial vasculature of both lower extremities. Focal ectasia at the right internal iliac artery, containing mild thrombus. Scattered calcification of the common femoral arteries bilaterally, with mild mural thrombus. -Dr Tyrell Antonio Discussed CTA findings with Dr Donnetta Hutching, chronic old finsing. Patient to follow up with Dr Scot Dock in one months after his recovery from admission.   Bilateral hip pain, CTA lower extremity no significant soft tissue or osseous abnormalities, bilateral hip x ray with / bilateral femoral head abc and mild degenerative changes in both hips. Will refer to ortho outpatient follow up   H/o AAA repair in 2016, stable, last evaluated by cardiology on 3/22/207,   Code Status: Full Code.  Family Communication: Care discussed with Wife and daughter who are at bedside.  Disposition Plan: discharge home on 4/6 wit home  health   Consultants:  Neurosurgery  Vascular Sx phone consult.  Procedures:  none  Antibiotics:  Vancomycin from admission to 4/5  Aztreonam from admission to 4/5  doxycyclin from 4/5 to 4/6   Discharge Exam: BP 134/68 mmHg  Pulse 74  Temp(Src) 97.9 F (36.6 C) (Oral)  Resp 16  Ht '5\' 11"'$  (1.803 m)  Wt 89.585 kg (197 lb 8 oz)  BMI 27.56 kg/m2  SpO2 97%    General: Sleepy, no acute distress  Cardiovascular: S 1, S 2 RRR  Respiratory: CTA  Abdomen: BS present, distend.   Musculoskeletal: trace edema has resolved. Left foot erythema has resolved,    Discharge Instructions You were cared for by a hospitalist during your hospital stay. If you have any questions about your discharge medications or the care you received while you were in the hospital after you are discharged, you can call the unit and asked to speak with the hospitalist on call if the hospitalist that took care of you is not available. Once you are discharged, your primary care physician will handle any further medical issues. Please note that NO REFILLS for any discharge medications will be authorized once you are discharged, as it is imperative that you return to your primary care physician (or establish a relationship with a primary care physician if you do not have one) for your aftercare needs so that they can reassess your need for medications and monitor your lab  values.      Discharge Instructions    AMB Referral to [facility] Management    Complete by:  As directed   Reason for consult:  Post hospital monitoring follow up,  Expected date of contact:  1-3 days (reserved for hospital discharges)  Patient has [facility] - insurance card checked and eligible. Please assign to community nurse for transition of care calls and assess for home visits.  Admitted with acute kidney injury with encelphal.  Questions please call:   Natividad Brood, RN BSN [facility] Liaison   [phone] business mobile phone Toll free office [phone]     Diet - low sodium heart healthy    Complete by:  As directed      Face-to-face encounter (required for Medicare/Medicaid patients)    Complete by:  As directed   I Patric Vanpelt certify that this patient is under my care and that I, or a nurse practitioner or physician's assistant working with me, had a face-to-face encounter that meets the physician face-to-face encounter requirements with this patient on 07/27/2015. The encounter with the patient was in whole, or in part for the following medical condition(s) which is the primary reason for home health care (List medical condition): FTT  The encounter with the patient was in whole, or in part, for the following medical condition, which is the primary reason for home health care:  FTT  I certify that, based on my findings, the following services are medically necessary home health services:  Physical therapy  Reason for Medically Necessary Home Health Services:  Skilled Nursing- Change/Decline in Patient Status  My clinical findings support the need for the above services:  Pain interferes with ambulation/mobility  Further, I certify that my clinical findings support that this patient is homebound due to:  Pain interferes with ambulation/mobility     Home Health    Complete by:  As directed   To provide the following care/treatments:   PT OT       Increase activity slowly    Complete by:  As directed             Medication List    STOP taking these medications        cyclobenzaprine 10 MG tablet  Commonly known as:  FLEXERIL     diazepam 5 MG tablet  Commonly known as:  VALIUM     olmesartan-hydrochlorothiazide 40-12.5 MG tablet  Commonly known as:  BENICAR HCT      TAKE these medications        allopurinol 100 MG tablet  Commonly known as:  ZYLOPRIM  Take 100 mg by mouth at bedtime.     aspirin EC 325 MG tablet  Take 325 mg by mouth daily.     bicalutamide  50 MG tablet  Commonly known as:  CASODEX  Take 50 mg by mouth at bedtime.     clove oil liquid  Apply 1 application topically as needed (for supportive care). Essential oils     CO Q10 PO  Take 300 mg by mouth daily.     colchicine 0.6 MG tablet  Take 1 tablet (0.6 mg total) by mouth daily.     docusate sodium 100 MG capsule  Commonly known as:  COLACE  Take 100 mg by mouth 3 (three) times daily.     feeding supplement (ENSURE ENLIVE) Liqd  Take 237 mLs by mouth 2 (two) times daily between meals.     fenofibrate 160 MG  tablet  Take 160 mg by mouth every evening.     finasteride 5 MG tablet  Commonly known as:  PROSCAR  Take 5 mg by mouth at bedtime.     HYDROcodone-acetaminophen 10-325 MG tablet  Commonly known as:  NORCO  Take 1 tablet by mouth every 6 (six) hours as needed for moderate pain.     metFORMIN 1000 MG tablet  Commonly known as:  GLUCOPHAGE  Take 0.5 tablets (500 mg total) by mouth 2 (two) times daily with a meal.     metoprolol 50 MG tablet  Commonly known as:  LOPRESSOR  Take 25 mg by mouth 2 (two) times daily.     NON FORMULARY  1 application by Other route daily as needed. Essential oils--Deep relief--menthol oil, ginger root, lemon citrus, and flower oil     pantoprazole 40 MG tablet  Commonly known as:  PROTONIX  Take 40 mg by mouth as needed (gerd, acid reflux).     polyethylene glycol packet  Commonly known as:  MIRALAX / GLYCOLAX  Take 17 g by mouth daily.     pravastatin 20 MG tablet  Commonly known as:  PRAVACHOL  Take 20 mg by mouth every evening.     predniSONE 10 MG tablet  Commonly known as:  DELTASONE  Label  & dispense according to the schedule below. 4 Pills PO on day one, 3 Pills PO on day two, 2 Pills PO on day three, 1 Pill po on day four, then stop.     ranitidine 75 MG tablet  Commonly known as:  ZANTAC  Take 150 mg by mouth at bedtime as needed for heartburn.       Allergies  Allergen Reactions  . Statins Other  (See Comments)    MUSCLE CRAMPS  . Valium [Diazepam] Other (See Comments)    Talking out of his head  . Adhesive [Tape] Itching and Rash    Blisters, Please use "paper" tape  . Penicillins Rash    Has patient had a PCN reaction causing immediate rash, facial/tongue/throat swelling, SOB or lightheadedness with hypotension: Unknown Has patient had a PCN reaction causing severe rash involving mucus membranes or skin necrosis: No Has patient had a PCN reaction that required hospitalization No Has patient had a PCN reaction occurring within the last 10 years: No If all of the above answers are "NO", then may proceed with Cephalosporin use.    Follow-up Information    Follow up with [facility] In 1 week.   Specialty:  Specialist   Why:  bilaterl hip pain and gout   Contact information:   [address] [place] [address] [phone]       Follow up with [name], MD In 1 week.   Specialty:  Family Medicine   Why:  hospital discharge follow up and repeat cbc/bmp at follow up, consider sleep study   Contact information:   [address] [place] [address] [phone]       Follow up with [name], MD In 1 month.   Specialties:  Vascular Surgery, Cardiology   Why:  peripheral vascular disease   Contact information:   [address] [place] [address] [phone]        The results of significant diagnostics from this hospitalization (including imaging, microbiology, ancillary and laboratory) are listed below for reference.    Significant Diagnostic Studies: Dg Chest 1 View  07/25/2015  CLINICAL DATA:  Fever following recent  fall and back surgery. EXAM: CHEST 1 VIEW COMPARISON:  07/23/2015 FINDINGS: Upper limits normal heart size identified. There is no evidence of focal airspace disease, pulmonary edema, suspicious pulmonary nodule/mass, pleural effusion, or pneumothorax. No acute bony abnormalities are  identified. IMPRESSION: No active disease. Electronically Signed   By: Margarette Canada M.D.   On: 07/25/2015 11:56   Dg Chest 2 View  07/27/2015  CLINICAL DATA:  Hypoxia EXAM: CHEST  2 VIEW COMPARISON:  07/25/2015 FINDINGS: Cardiac shadow is stable. The lungs again demonstrate some mild interstitial changes. No focal infiltrate or sizable effusion is seen. No bony abnormality is noted. IMPRESSION: No acute abnormality noted. Electronically Signed   By: Inez Catalina M.D.   On: 07/27/2015 14:09   Dg Chest 2 View  07/23/2015  CLINICAL DATA:  Altered awareness, transient. EXAM: CHEST  2 VIEW COMPARISON:  None. FINDINGS: Heart size is upper normal. Lungs are at least mildly hyperexpanded suggesting COPD. Suspect associated chronic bronchitic changes centrally. No confluent airspace opacity to suggest a developing pneumonia. No pleural effusion no pneumothorax seen. Osseous structures about the chest are unremarkable. Mild degenerative spurring within the thoracic spine. IMPRESSION: 1. No acute findings.  No evidence of pneumonia. 2. Lungs at least mildly hyperexpanded suggesting COPD. Suspect associated chronic bronchitic changes centrally. Electronically Signed   By: Franki Cabot M.D.   On: 07/23/2015 22:27   Ct Head Wo Contrast  07/23/2015  CLINICAL DATA:  Slurred speech since yesterday. Lumbar back surgery on Tuesday. Fall from bed this afternoon. Leg weakness. EXAM: CT HEAD WITHOUT CONTRAST TECHNIQUE: Contiguous axial images were obtained from the base of the skull through the vertex without intravenous contrast. COMPARISON:  None. FINDINGS: Brain: There is mild generalized brain atrophy with commensurate dilatation of the sulci. Ventricles are normal in size and configuration. All areas of the brain demonstrate normal gray-white matter attenuation. There is no mass, hemorrhage, edema or other evidence of acute parenchymal abnormality. No extra-axial hemorrhage. Vascular: No hyperdense vessel or unexpected  calcification. There are chronic calcified atherosclerotic changes of the large vessels at the skull base. Skull: Negative for fracture or focal lesion. Sinuses/Orbits: No acute findings. Other: None. IMPRESSION: Negative head CT.  No intracranial mass, hemorrhage or edema. Electronically Signed   By: Franki Cabot M.D.   On: 07/23/2015 22:37   Mr Brain Wo Contrast  07/24/2015  CLINICAL DATA:  Confusion.  Recent lumbar discectomy. EXAM: MRI HEAD WITHOUT CONTRAST TECHNIQUE: Multiplanar, multiecho pulse sequences of the brain and surrounding structures were obtained without intravenous contrast. COMPARISON:  Head CT 07/23/2015 and MRI 02/23/2004 FINDINGS: A non expanded partially empty sella is again seen. The position of the cerebellar tonsils is unchanged and within normal limits. There is no evidence of acute infarct, intracranial hemorrhage, mass, midline shift, or extra-axial fluid collection. There is mild generalized cerebral atrophy. Periventricular white matter T2 hyperintensities are similar to the prior MRI and nonspecific but compatible with mild chronic small vessel ischemic disease. Prior bilateral cataract extraction is noted. Very mild paranasal sinus mucosal thickening is noted with likely mucous retention cysts in the maxillary sinuses. The mastoid air cells are clear. Major intracranial vascular flow voids are preserved. IMPRESSION: 1. No acute intracranial abnormality. 2. Mild chronic small vessel ischemic disease. Electronically Signed   By: Logan Bores M.D.   On: 07/24/2015 16:14   Mr Lumbar Spine Wo Contrast  07/25/2015  CLINICAL DATA:  Left lower extremity weakness. Lumbar laminectomy 07/19/2015 EXAM: MRI LUMBAR SPINE WITHOUT CONTRAST TECHNIQUE: Multiplanar,  multisequence MR imaging of the lumbar spine was performed. No intravenous contrast was administered. COMPARISON:  Lumbar MRI 06/05/2015 FINDINGS: Postop laminectomy on the left at L5-S1. Negative for fluid collection. Negative for  fracture or mass. Conus medullaris normal and terminates at L2-3. Severe dilatation left renal pelvis as noted on prior imaging. L1-2:  Mild disc and facet degeneration without spinal stenosis L2-3: Mild disc bulging. Bilateral facet hypertrophy causing mild spinal stenosis. L3-4: Mild disc and facet degeneration without significant spinal stenosis L4-5: Diffuse disc bulging. Moderate facet hypertrophy bilaterally. Mild spinal stenosis. L5-S1: Left laminectomy. Subcutaneous fluid collection measuring 32 x 16 mm with small gas bubbles related to recent surgery. Mild disc space narrowing. Schmorl's node of the right S1 superior endplate is unchanged. Moderate foraminal narrowing bilaterally Epidural soft tissue structure on the left ventral epidural space may represent recurrent disc protrusion or epidural hematoma. This extends caudally behind the S1 vertebral body. This appears to be compressing the left S1 nerve root suggesting this is likely a disc protrusion. Postop tissue thickening considered less likely. Postcontrast may be helpful to differentiate postoperative change from disc protrusion and epidural hematoma. No significant spinal stenosis. IMPRESSION: Postop laminectomy on the left at L5-S1. Subcutaneous fluid collection most likely is a benign postop seroma. CSF collection and infection not excluded. Soft tissue in the ventral epidural space on the left at L5-S1 likely represent recurrent disc protrusion. Differential includes epidural hematoma and postop edema. Postcontrast imaging may be helpful for differentiation. Electronically Signed   By: Franchot Gallo M.D.   On: 07/25/2015 11:12   Ct Angio Ao+bifem W/cm &/or Wo/cm  07/23/2015  CLINICAL DATA:  Acute onset of slurred speech. [place] out of bed; difficulty standing up. Recent lower back surgery. Initial encounter. EXAM: CT ANGIOGRAPHY OF ABDOMINAL AORTA WITH ILIOFEMORAL RUNOFF TECHNIQUE: Multidetector CT imaging of the abdomen, pelvis and lower  extremities was performed using the standard protocol during bolus administration of intravenous contrast. Multiplanar CT image reconstructions and MIPs were obtained to evaluate the vascular anatomy. CONTRAST:  80 mL of Isovue 370 IV contrast COMPARISON:  CT of the abdomen and pelvis from 10/29/2014 FINDINGS: Aorta: There is mild aneurysmal dilatation of the descending thoracic and proximal abdominal aorta, measuring 3.5 cm in AP dimension at the level of the diaphragm. This resolves relatively proximally, above the level of the renal arteries. An aortoiliac stent is noted distal to the renal arteries, and appears fully patent. The celiac trunk, superior mesenteric artery, and bilateral renal arteries appear patent. There appears to be reconstitution of the inferior mesenteric artery from mesenteric branches. There is focal ectasia at the right internal iliac artery, containing mild thrombus. Scattered calcification is noted at the common femoral arteries bilaterally, with mild mural thrombus. Right Lower Extremity: There is scattered calcification along the right common femoral artery, right superficial femoral artery and popliteal artery. Mild luminal narrowing is suggested along the proximal superficial femoral artery. Mild calcification is seen along the branches of the popliteal artery. There is patent 3 vessel runoff to the level of the right lower leg. Left Lower Extremity: There is scattered calcification along the left common femoral artery, left superficial femoral artery and popliteal artery. Mild luminal narrowing is noted along the proximal left superficial femoral artery, and moderate luminal narrowing is seen along the distal left superficial femoral artery. There is mild aneurysmal dilation of the proximal popliteal artery to 1.4 cm, with mild associated mural thrombus. Scattered calcification is seen along the branches of the popliteal artery.  Three-vessel runoff is noted to the level of the left  lower leg. Diffuse coronary artery calcifications are seen. Mild bibasilar atelectasis is noted. Scattered calcified nodes are noted adjacent to the distal esophagus, and a calcified granuloma is noted at the left lung base. The liver and spleen are unremarkable in appearance. Vaguely increased attenuation within the gallbladder may reflect stones. The gallbladder is otherwise unremarkable. The pancreas and adrenal glands are unremarkable. Numerous large cysts are again noted about the left kidney. Smaller right renal cysts are seen. Nonspecific perinephric stranding is noted bilaterally. There is no evidence of hydronephrosis. No renal or ureteral stones are seen. No free fluid is identified. The small bowel is unremarkable in appearance. The stomach is within normal limits. The appendix is normal in caliber, without evidence of appendicitis. Scattered diverticulosis is noted along the distal descending and sigmoid colon, without evidence of diverticulitis. The bladder is mildly distended and grossly unremarkable. The patient is status post prostatectomy. No inguinal lymphadenopathy is seen. The lower extremities are grossly unremarkable in appearance. No significant soft tissue edema is seen. The musculature is grossly unremarkable in appearance. No acute osseous abnormalities are identified. No acute osseous abnormalities are identified. Review of the MIP images confirms the above findings. IMPRESSION: 1. No acute occlusion seen. Mild luminal narrowing along the proximal superficial femoral arteries bilaterally, and moderate luminal narrowing along the distal left superficial femoral artery. 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 Scattered calcification within the arterial vasculature of both lower extremities. Three-vessel runoff noted to the lower legs bilaterally, though the vasculature is not well seen at the ankles. 2. Aortoiliac stent  appears fully patent. 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 Scattered calcification along the abdominal aorta and its branches. Recommend followup by ultrasound in 2 years. This recommendation follows ACR consensus guidelines: White Paper of the ACR Incidental Findings Committee II on Vascular Findings. J Am Coll Radiol 2013; 10:789-794. 3. Focal ectasia at the right internal iliac artery, containing mild thrombus. Scattered calcification of the common femoral arteries bilaterally, with mild mural thrombus. 4. Diffuse coronary artery calcifications seen. 5. Mild bibasilar atelectasis noted. Calcified nodes adjacent to the distal esophagus, reflecting remote granulomatous disease. 6. Vaguely increased attenuation within the gallbladder may reflect stones. Gallbladder otherwise unremarkable. 7. Numerous large left renal cysts.  Smaller right renal cysts. 8. Scattered diverticulosis along the distal descending and sigmoid colon, without evidence of diverticulitis. Electronically Signed   By: Garald Balding M.D.   On: 07/23/2015 22:54   Ct L-spine No Charge  07/23/2015  CLINICAL DATA:  Lower lumbar surgery on Tuesday, slurred speech yesterday, fall from bed this afternoon. Leg weakness. EXAM: CT LUMBAR SPINE WITHOUT CONTRAST TECHNIQUE: Multidetector CT imaging of the lumbar spine was performed without intravenous contrast administration. Multiplanar CT image reconstructions were also generated. COMPARISON:  None. FINDINGS: There are surgical changes of recent left hemi-laminectomies at the L5 and S1 levels. There is expected postsurgical edema and air at the laminectomy sites. No evidence of hematoma or other postsurgical complicating feature. Disc bulges are noted at the L4-5 and L5-S1 levels, causing at least moderate central canal stenosis at the L4-5 level with the additional degenerative facet  hypertrophy. There is grossly adequate decompression at the L5-S1 level. There is no acute or suspicious osseous lesion within the lumbar spine or upper sacrum. Atherosclerotic changes  are seen along the walls of the normal-caliber abdominal aorta and there are surgical changes of aorta bi-iliac graft which appears patent and stable compared to an earlier CT of 10/29/2014. Multiple cysts are partially imaged within the left kidney, similar in configuration to the CT abdomen of 10/29/2014. No acute- appearing abnormality within the paravertebral soft tissues. IMPRESSION: 1. Surgical changes of recent left hemi-laminectomies at the L5 and S1 levels. Expected postsurgical changes. No evidence of surgical complicating feature. Adequate decompression at the L5-S1 level. 2. Disc bulge at the L4-5 level, combined with degenerative facet hypertrophy, causing moderate central canal stenosis with possible associated nerve root impingements. 3. No acute or suspicious osseous abnormality. No osseous fracture or dislocation. 4. Chronic/incidental findings within the paravertebral soft tissues, as detailed above. Electronically Signed   By: Franki Cabot M.D.   On: 07/23/2015 23:03   Dg Hip Unilat With Pelvis 2-3 Views Left  07/25/2015  CLINICAL DATA:  Fall with left hip pain. EXAM: DG HIP (WITH OR WITHOUT PELVIS) 2-3V LEFT COMPARISON:  05/27/2015 FINDINGS: No acute fracture, subluxation or dislocation identified. Mild degenerative changes within both hips and lower lumbar spine noted. Increased density in both femoral heads may represent AVN. There is no evidence of femoral head flattening. IMPRESSION: No evidence of acute abnormality. 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 No evidence of fracture or flattening. Mild degenerative changes in both hips. Electronically Signed   By: Margarette Canada M.D.   On: 07/25/2015 12:00    Microbiology: Recent Results (from the past 240 hour(s))  Culture, blood (routine x 2)     Status: None  (Preliminary result)   Collection Time: 07/24/15  7:00 AM  Result Value Ref Range Status   Specimen Description BLOOD LEFT ANTECUBITAL  Final   Special Requests BOTTLES DRAWN AEROBIC AND ANAEROBIC 10CC  Final   Culture  Setup Time   Final    GRAM POSITIVE COCCI IN CLUSTERS AEROBIC BOTTLE ONLY CRITICAL RESULT CALLED TO, READ BACK BY AND VERIFIED WITH: Jaclyn Shaggy RN 14:35 07/26/15 ([email or web address])    Culture   Final    STAPHYLOCOCCUS SPECIES (COAGULASE NEGATIVE) THE SIGNIFICANCE OF ISOLATING THIS ORGANISM FROM A SINGLE SET OF BLOOD CULTURES WHEN MULTIPLE SETS ARE DRAWN IS UNCERTAIN. PLEASE NOTIFY THE MICROBIOLOGY DEPARTMENT WITHIN ONE WEEK IF SPECIATION AND SENSITIVITIES ARE REQUIRED.    Report Status PENDING  Incomplete  Culture, blood (routine x 2)     Status: None (Preliminary result)   Collection Time: 07/24/15  7:03 AM  Result Value Ref Range Status   Specimen Description BLOOD RIGHT HAND  Final   Special Requests BOTTLES DRAWN AEROBIC ONLY 5CC  Final   Culture NO GROWTH 4 DAYS  Final   Report Status PENDING  Incomplete  MRSA PCR Screening     Status: None   Collection Time: 07/24/15  2:53 PM  Result Value Ref Range Status   MRSA by PCR NEGATIVE NEGATIVE Final    Comment:        The GeneXpert MRSA Assay (FDA approved for NASAL specimens only), is one component of a comprehensive MRSA colonization surveillance program. It is not intended to diagnose MRSA infection nor to guide or monitor treatment for MRSA infections.      Labs: Basic Metabolic Panel:  Recent Labs Lab 07/23/15 2032 07/24/15 0330 07/25/15 1154 07/26/15 0510 07/28/15 0442  NA 131* 132* 134* 132* 137  K 4.4 4.3 4.1 4.1 4.6  CL 96* 98* 98* 97* 100*  CO2 '22 23 23 '$ 22  25  GLUCOSE 173* 216* 175* 163* 239*  BUN 24* 22* '16 15 20  '$ CREATININE 1.78* 1.54* 1.37* 1.25* 1.30*  CALCIUM 9.5 8.6* 8.8* 8.2* 8.9  MG  --   --   --   --  2.3   Liver Function Tests:  Recent Labs Lab 07/23/15 2044  AST 20  ALT 15*   ALKPHOS 28*  BILITOT 0.8  PROT 7.5  ALBUMIN 3.4*   No results for input(s): LIPASE, AMYLASE in the last 168 hours.  Recent Labs Lab 07/23/15 2055  AMMONIA 14   CBC:  Recent Labs Lab 07/23/15 2032 07/24/15 0330 07/25/15 1154 07/26/15 0510 07/28/15 0442  WBC 8.8 6.2 6.6 5.4 5.6  HGB 10.7* 8.8* 9.0* 8.5* 9.1*  HCT 31.0* 27.0* 27.0* 27.0* 28.5*  MCV 93.4 92.8 93.1 94.1 93.1  PLT 247 224 251 233 327   Cardiac Enzymes: No results for input(s): CKTOTAL, CKMB, CKMBINDEX, TROPONINI in the last 168 hours. BNP: BNP (last 3 results) No results for input(s): BNP in the last 8760 hours.  ProBNP (last 3 results) No results for input(s): PROBNP in the last 8760 hours.  CBG:  Recent Labs Lab 07/27/15 1237 07/27/15 1636 07/27/15 2021 07/28/15 0614 07/28/15 1231  GLUCAP 195* 109* 225* 196* 317*       Signed:  Evangaline Jou MD, PhD  [facility] 07/28/2015, 3:32 PM

## 2015-07-28 NOTE — Progress Notes (Signed)
Inpatient Diabetes Program Recommendations  AACE/ADA: New Consensus Statement on Inpatient Glycemic Control (2015)  Target Ranges:  Prepandial:   less than 140 mg/dL      Peak postprandial:   less than 180 mg/dL (1-2 hours)      Critically ill patients:  140 - 180 mg/dL   Review of Glycemic Control  Results for Cody Moss, Cody Moss (MRN [id number]) as of 07/28/2015 13:43  Ref. Range 07/27/2015 12:37 07/27/2015 16:36 07/27/2015 20:21 07/28/2015 06:14 07/28/2015 12:31  Glucose-Capillary Latest Ref Range: 65-99 mg/dL 195 (H) 109 (H) 225 (H) 196 (H) 317 (H)    Diabetes history: Type 2 diabetes Outpatient Diabetes medications: Metformin '1000mg'$  bid Current orders for Inpatient glycemic control: Novolog 0-9 units tid  **steroids daily with breakfast  Inpatient Diabetes Program Recommendations:   Please consider ordering an A1C to determine pre- admission blood sugar control.         Consider starting Novolog 3 units tid with meals while patient is on steroids.         Consider Novolog 0-5 units qhs.   Cody Fitz, RN, BA, MHA, CDE Diabetes Coordinator Inpatient Diabetes Program  [phone] (Team Pager) [phone] ([place]) 07/28/2015 1:46 PM

## 2015-07-28 NOTE — Progress Notes (Signed)
*  PRELIMINARY RESULTS* Echocardiogram 2D Echocardiogram has been performed.  Leavy Cella 07/28/2015, 10:30 AM

## 2015-07-28 NOTE — Progress Notes (Signed)
Occupational Therapy Treatment Patient Details Name: Cody Moss MRN: [id number] DOB: 1941/04/04 Today's Date: 07/28/2015    History of present illness 75 y.o. male with a recent Lumbar Laminectomy 5 days ago ( Dr [facility]) and history of HTN, Hyperlipidemia PVD, AAA repair who presents to the ED with complains of increased confusion, and weakness, and fall 2 days ago   OT comments  Pt hopes to DC soon  Follow Up Recommendations  Home health OT;Supervision - Intermittent    Equipment Recommendations  None recommended by OT    Recommendations for Other Services      Precautions / Restrictions Precautions Precautions: Back;Fall Precaution Booklet Issued: Yes (comment) Precaution Comments: reviewed precautions Restrictions Weight Bearing Restrictions: No       Mobility Bed Mobility Overal bed mobility: Needs Assistance Bed Mobility: Rolling;Sidelying to Sit;Sit to Sidelying;Sit to Supine Rolling: Supervision Sidelying to sit: Supervision   Sit to supine: Supervision Sit to sidelying: Supervision    Transfers Overall transfer level: Needs assistance   Transfers: Sit to/from Stand;Stand Pivot Transfers Sit to Stand: Min guard Stand pivot transfers: Min guard                ADL Overall ADL's : Needs assistance/impaired                         Toilet Transfer: Cueing for safety;Ambulation;Grab bars Toilet Transfer Details (indicate cue type and reason): urinal in bathroom Toileting- Clothing Manipulation and Hygiene: Min guard;Sit to/from stand;Cueing for sequencing;Cueing for safety;Adhering to back precautions         General ADL Comments: wife and daugther will a as needed                Cognition   Behavior During Therapy: WFL for tasks assessed/performed Overall Cognitive Status: Within Functional Limits for tasks assessed                               General Comments  back precaution education provided     Pertinent Vitals/ Pain       Faces Pain Scale: Hurts a little bit Pain Location: back Pain Descriptors / Indicators: Sore Pain Intervention(s): Monitored during session         Frequency Min 2X/week     Progress Toward Goals  OT Goals(current goals can now be found in the care plan section)  Progress towards OT goals: Progressing toward goals     Plan Discharge plan remains appropriate       End of Session  with ECHO in room   Activity Tolerance Patient tolerated treatment well;No increased pain   Patient Left in bed;with call bell/phone within reach;with family/visitor present   Nurse Communication          Time: 1275-1700 OT Time Calculation (min): 16 min  Charges: OT General Charges $OT Visit: 1 Procedure OT Treatments $Self Care/Home Management : 8-22 mins  Salvador [place], Thereasa Parkin 07/28/2015, 9:41 AM

## 2015-07-28 NOTE — Care Management Important Message (Signed)
Important Message  Patient Details  Name: Cody Moss MRN: [id number] Date of Birth: November 17, 1940   Medicare Important Message Given:  Yes    Barb Merino Cherly Erno 07/28/2015, 12:42 PM

## 2015-07-28 NOTE — Progress Notes (Signed)
Physical Therapy Treatment Patient Details Name: Cody Moss MRN: [id number] DOB: November 30, 1940 Today's Date: 07/31/2015    History of Present Illness 75 y.o. male with a recent Lumbar Laminectomy 5 days ago ( Dr [facility]on [facility]) and history of HTN, Hyperlipidemia PVD, AAA repair who presents to the ED with complains of increased confusion, and weakness, and fall 2 days ago    PT Comments    Patient mobilizing well without device, tolerated increased activity, stairs, and bed mobility. Educated on car transfers and activity recommendations for discharge. Anticipate that patient is safe for d/c home.  Follow Up Recommendations  Home health PT;Supervision for mobility/OOB     Equipment Recommendations  Rolling walker with 5" wheels    Recommendations for Other Services       Precautions / Restrictions Precautions Precautions: Back;Fall Precaution Booklet Issued: Yes (comment) Precaution Comments: reviewed precautions    Mobility  Bed Mobility Overal bed mobility: Needs Assistance Bed Mobility: Rolling;Sidelying to Sit;Sit to Sidelying;Sit to Supine Rolling: Supervision Sidelying to sit: Supervision   Sit to supine: Supervision Sit to sidelying: Supervision General bed mobility comments: performed x2 to ensure technique  Transfers Overall transfer level: Needs assistance   Transfers: Sit to/from Stand Sit to Stand: Supervision Stand pivot transfers: Supervision          Ambulation/Gait Ambulation/Gait assistance: Supervision Ambulation Distance (Feet): 80 Feet Assistive device: None       General Gait Details: steady with gait   Stairs Stairs: Yes Stairs assistance: Supervision Stair Management: One rail Right Number of Stairs: 5 General stair comments: cued for step to technique  Wheelchair Mobility    Modified Rankin (Stroke Patients Only)       Balance                                    Cognition Arousal/Alertness:  Awake/alert Behavior During Therapy: WFL for tasks assessed/performed Overall Cognitive Status: Within Functional Limits for tasks assessed                      Exercises      General Comments General comments (skin integrity, edema, etc.): educated on car transfers, activity expectations, precautions, and safety with mobility upon discharge.      Pertinent Vitals/Pain Pain Assessment: No/denies pain    Home Living                      Prior Function            PT Goals (current goals can now be found in the care plan section) Acute Rehab PT Goals Patient Stated Goal: to go home PT Goal Formulation: With patient/family Time For Goal Achievement: 08/08/15 Potential to Achieve Goals: Fair Progress towards PT goals: Progressing toward goals    Frequency  Min 3X/week    PT Plan Current plan remains appropriate    Co-evaluation             End of Session Equipment Utilized During Treatment: Gait belt Activity Tolerance: Patient tolerated treatment well Patient left: in chair;with call bell/phone within reach;with family/visitor present     Time: 8786-7672 PT Time Calculation (min) (ACUTE ONLY): 19 min  Charges:  $Gait Training: 8-22 mins                    G CodesDuncan Dull 31-Jul-2015, 5:54  PM Alben Deeds, [place] DPT  [phone]

## 2015-07-29 ENCOUNTER — Other Ambulatory Visit: Payer: Self-pay

## 2015-07-29 LAB — HEMOGLOBIN A1C
Hgb A1c MFr Bld: 7.6 % — ABNORMAL HIGH (ref 4.8–5.6)
Mean Plasma Glucose: 171 mg/dL

## 2015-07-29 LAB — CULTURE, BLOOD (ROUTINE X 2): Culture: NO GROWTH

## 2015-07-29 LAB — ECHOCARDIOGRAM COMPLETE
Height: 71 in
Weight: 3160 oz

## 2015-07-29 NOTE — Patient Outreach (Signed)
Transition of care call: Placed call and spoke with wife who reports that patient is doing well with the exceptions of being tearful. Wife reports that patient is worried about the "tax bill" and him being able to return to work. Wife reports that she has call primary MD to discuss medications and her concerns about patient being emotional. Also reports that she is waiting for a call back about when hospital follow up will be scheduled.  Reviewed reason for call and offered home visit. Wife accepted. Home visit planned for 08/02/2015. Confirmed address and provided my contact phone numbers. Medications not reviewed as patient and wife a awaiting a call from Primary Md about medications.  PLAN: Home visit on 08/02/2015.  THN CM Care Plan Problem One        Most Recent Value   Care Plan Problem One  Recent admission for back surgery   Role Documenting the Problem One  Care Management [place] for Problem One  Active   THN Long Term Goal (31-90 days)  Patient will report no readmissions in the next 31 days.   THN Long Term Goal Start Date  07/29/15   Interventions for Problem One Long Term Goal  Reviewed need for timely follow up with MD. Enocouraged patient to take all meds as prescibed.   THN CM Short Term Goal #1 (0-30 days)  Patient will verbalize attending follow up hospital MD appointment in the next 10 days   THN CM Short Term Goal #1 Start Date  07/29/15   Interventions for Short Term Goal #1  Encouraged wife to make appointment asap. Offered to assist but wife is awaitng a call back from MD today.   THN CM Short Term Goal #2 (0-30 days)  Patient and or wife will confirm clairifcation of discharged medidations in the next 5 days.   THN CM Short Term Goal #2 Start Date  07/29/15   Interventions for Short Term Goal #2  Offered to assist however wife pending call back. Enocuraged wife or patient to call me for questions or concerns. home visit planned for 08/02/2015      Tomasa Rand, RN, BSN, [place] Coordinator [phone]

## 2015-08-02 ENCOUNTER — Other Ambulatory Visit: Payer: Self-pay

## 2015-08-03 ENCOUNTER — Other Ambulatory Visit: Payer: Self-pay

## 2015-08-03 NOTE — Patient Outreach (Signed)
[facility]) Care Management   08/03/2015  Cody Moss Oct 02, 1940 [id number]  Cody Moss is an 75 y.o. male Arrived for home visit. Wife and daughter present and actively engaged in home visit Subjective: Patient reports that he is doing better since his was discharged home from hospital. Reports that he had back surgery and was discharged home on pain meds and muscle relaxers. Reports that he got home and became confused. States that he fell. Reports that he went back to the emergency department and was admitted with kidney issues and confusion.  Reports that once he stopped the flexeril that he got better. Reports that he attempted to quit smoking after discharge but became upset and emotional and started smoking again.  Patient reports that he is active with home health physical therapy. Reports increased pain after working with physical therapy. Reports that he is walking in home and doing home exercise program recommended by therapy.  Reports that he is walking well but has problems initially getting to the standing position. DM: Reports that he was not monitoring well before surgery. Reports that he is now checking CBG 3 times per day.  Today's fasting of 157. Reports following diet about 50 % of the time.  Patient reports that he saw primary MD today prior to this home visit. Reports follow up planned with Dr. Annette Stable on 08/03/2015 Objective:   Filed Vitals:   08/02/15 1424  BP: 118/58  Pulse: 67  Resp: 18  Height: 1.778 m ('5\' 10"' )  Weight: 196 lb (88.905 kg)  SpO2: 97%   Review of Systems  Constitutional: Negative.   HENT: Negative.   Eyes: Negative.   Respiratory: Negative.   Cardiovascular: Positive for leg swelling.       Trace edema  Gastrointestinal: Negative.   Genitourinary: Negative.   Musculoskeletal: Positive for joint pain and falls.       Reports pain in lower back, hips, knees  Skin: Negative.   Neurological: Negative.    Endo/Heme/Allergies: Negative.   Psychiatric/Behavioral: Negative.     Physical Exam  Constitutional: He is oriented to person, place, and time. He appears well-developed and well-nourished.  Cardiovascular: Normal rate.   Respiratory: Effort normal and breath sounds normal.  Lung clear  GI: Soft. Bowel sounds are normal.  Musculoskeletal: Normal range of motion. He exhibits edema.  Trace edema to the lower legs  Neurological: He is alert and oriented to person, place, and time.  Skin: Skin is warm and dry.  Incision site intact to lower back. Area is red without drainage.   Psychiatric: He has a normal mood and affect. His behavior is normal. Judgment and thought content normal.    Encounter Medications:   Outpatient Encounter Prescriptions as of 08/02/2015  Medication Sig Note  . allopurinol (ZYLOPRIM) 100 MG tablet Take 100 mg by mouth at bedtime.    Marland Kitchen aspirin EC 325 MG tablet Take 325 mg by mouth daily.   . bicalutamide (CASODEX) 50 MG tablet Take 50 mg by mouth at bedtime.    . clove oil liquid Apply 1 application topically as needed (for supportive care). Essential oils   . Coenzyme Q10 (CO Q10 PO) Take 300 mg by mouth daily.   . colchicine 0.6 MG tablet Take 1 tablet (0.6 mg total) by mouth daily. (Patient taking differently: Take 0.6 mg by mouth daily as needed. For gout) 08/02/2015: Taking daily  . docusate sodium (COLACE) 100 MG capsule Take 100 mg by mouth 3 (three)  times daily. 07/23/2015: Since Tuesday  . feeding supplement, ENSURE ENLIVE, (ENSURE ENLIVE) LIQD Take 237 mLs by mouth 2 (two) times daily between meals. 08/02/2015: Once a day.  . fenofibrate 160 MG tablet Take 160 mg by mouth every evening.   . finasteride (PROSCAR) 5 MG tablet Take 5 mg by mouth at bedtime.    Marland Kitchen HYDROcodone-acetaminophen (NORCO/VICODIN) 5-325 MG tablet Take 1 tablet by mouth every 6 (six) hours as needed.   . metFORMIN (GLUCOPHAGE) 1000 MG tablet Take 0.5 tablets (500 mg total) by mouth 2 (two)  times daily with a meal.   . metoprolol (LOPRESSOR) 50 MG tablet Take 25 mg by mouth 2 (two) times daily.    . NON FORMULARY 1 application by Other route daily as needed. Essential oils--Deep relief--menthol oil, ginger root, lemon citrus, and flower oil   . pantoprazole (PROTONIX) 40 MG tablet Take 40 mg by mouth as needed (gerd, acid reflux).    . pravastatin (PRAVACHOL) 20 MG tablet Take 20 mg by mouth every evening.   . ranitidine (ZANTAC) 75 MG tablet Take 150 mg by mouth at bedtime as needed for heartburn.    Marland Kitchen HYDROcodone-acetaminophen (NORCO) 10-325 MG tablet Take 1 tablet by mouth every 6 (six) hours as needed for moderate pain. (Patient not taking: Reported on 08/02/2015)   . polyethylene glycol (MIRALAX / GLYCOLAX) packet Take 17 g by mouth daily. Reported on 08/02/2015 07/23/2015: Started 07-23-15  . predniSONE (DELTASONE) 10 MG tablet Label  & dispense according to the schedule below. 4 Pills PO on day one, 3 Pills PO on day two, 2 Pills PO on day three, 1 Pill po on day four, then stop. (Patient not taking: Reported on 08/02/2015)    No facility-administered encounter medications on file as of 08/02/2015.    Functional Status:   In your present state of health, do you have any difficulty performing the following activities: 08/02/2015 07/26/2015  Hearing? N Y  Vision? N N  Difficulty concentrating or making decisions? N Y  Walking or climbing stairs? Y Y  Dressing or bathing? Y N  Doing errands, shopping? [name]  Preparing Food and eating ? Y -  Using the Toilet? N -  In the past six months, have you accidently leaked urine? Y -  Do you have problems with loss of bowel control? N -  Managing your Medications? N -  Managing your Finances? N -  Housekeeping or managing your Housekeeping? N -    Fall/Depression Screening:    PHQ 2/9 Scores 08/02/2015  PHQ - 2 Score 1   Fall Risk  08/02/2015  Falls in the past year? Yes  Number falls in past yr: 2 or more  Injury with Fall? No  Risk  Factor Category  High Fall Risk  Risk for fall due to : History of fall(s)  Follow up Falls prevention discussed   Assessment:   (1) reviewed [facility] program. Reviewed consent obtained in hospital. No changes needed. Reviewed [facility] call a nurse line. Provided my contact card and [facility] calendar.  (2) Hgb A1c of 7.6.  Patient reports that he knows his goal is 7.0.  Patient is currently monitoring 3 times per day. (3) recent intentional weight loss.  Drinking daily ensure as instructed by hospital staff.. Denies missing any meals.  (4) fall risk: Recent falls. Recent back surgery (5) unable to locate his advanced directives. Plan:  (1) Reviewed consent. No changes needed. (2) Reviewed reasons to have tighter DM control. Reviewed  DM diet.  Will clarify with MD recommended number of times CBG should be checked daily. Encouraged patient to record CBG readings in [facility] calendar daily. (3) Will clarify with MD if patient needs to continue ENSURE. (4) Reviewed fall precautions. ( wearing shoes or non slip socks, removal of throw rugs. Using assistive devices if needed) Active with home health physical therapy. (5) provided new advanced directive packet and reviewed how to complete.   Care planning and goal setting during home visit. Number 1 goal is to avoid readmissions. Will continue weekly transition of care calls. Encouraged patient to call MD for any new concerns or problems. Will send this note to MD.   [facility] CM Care Plan Problem One        Most Recent Value   Care Plan Problem One  Recent admission for back surgery   Role Documenting the Problem One  Care Management [place] for Problem One  Active   [facility] Long Term Goal (31-90 days)  Patient will report no readmissions in the next 31 days.   [facility]N Long Term Goal Start Date  07/29/15   Interventions for Problem One Long Term Goal  Home visit completed. Encouraged patient to call MD for any new problems or concerns   [facility] CM Short Term Goal  #1 (0-30 days)  Patient will verbalize attending follow up hospital MD appointment in the next 10 days   [facility] CM Short Term Goal #1 Start Date  07/29/15   [facility] CM Short Term Goal #1 Met Date  08/02/15 Barrie Folk met]   Interventions for Short Term Goal #1  Encouraged wife to make appointment asap. Offered to assist but wife is awaitng a call back from MD today.   [facility] CM Short Term Goal #2 (0-30 days)  Patient and or wife will confirm clairifcation of discharged medidations in the next 5 days.   [facility] CM Short Term Goal #2 Start Date  07/29/15   [facility] CM Short Term Goal #2 Met Date  08/02/15 Barrie Folk met]   Interventions for Short Term Goal #2  Offered to assist however wife pending call back. Enocuraged wife or patient to call me for questions or concerns. home visit planned for 08/02/2015    [facility] CM Care Plan Problem Two        Most Recent Value   Care Plan Problem Two  Hgb A1c not at goal   Role Documenting the Problem Two  Care Management [place] for Problem Two  Active   [facility] CM Short Term Goal #1 (0-30 days)  Patient will record CBG levels in [facility] calendar for the next 3 weeks.   [facility] CM Short Term Goal #1 Start Date  08/02/15   Interventions for Short Term Goal #2   Reviewed Dm diet, Reviewed foods to avoid, Encouraged patient to work on DM control. Provided [facility] calendar for patient to record CBG levels.      Tomasa Rand, RN, BSN, CEN [facility] [employer] [phone]

## 2015-08-03 NOTE — Patient Outreach (Signed)
Care coordination: Placed call to MD office and left a message with Hoyle Sauer.  Requested clarification if patient needs to continue ensure and how often does MD want patient to check CBG.  Plan: awaiting a call back.  Tomasa Rand, RN, BSN, CEN [facility] [employer] [phone]

## 2015-08-09 ENCOUNTER — Other Ambulatory Visit: Payer: Self-pay

## 2015-08-09 ENCOUNTER — Ambulatory Visit: Payer: Self-pay

## 2015-08-09 NOTE — Patient Outreach (Signed)
Transition of care call: Placed call to patient. No answer. Left a message requesting a call back.  PLAN: Will continue weekly outreach attempts.  Tomasa Rand, RN, BSN, CEN [facility] [employer] [phone]

## 2015-08-11 ENCOUNTER — Other Ambulatory Visit: Payer: Self-pay

## 2015-08-11 NOTE — Patient Outreach (Signed)
Second transition of care call this week. Placed call to patient and spoke with wife. Wife reports that Dr.Pool was pleased with progress. States that Dr. Manya Silvas office called back and stated to monitor CBG once a day and patient drink ensure as he felt needed.   Wife reports that patient has had some increase in back pain after physical therapy this week.  Wife reports [facility] nurse came to home and checked Hgb A1c and he was 6.3. Which is improved.    Wife denies any new problems or concerns.   PLAN: Will continue transition of care calls weekly.  Tomasa Rand, RN, BSN, CEN [facility] [employer] [phone]

## 2015-08-16 ENCOUNTER — Ambulatory Visit: Payer: Self-pay

## 2015-08-18 ENCOUNTER — Ambulatory Visit: Payer: Self-pay

## 2015-08-18 ENCOUNTER — Other Ambulatory Visit: Payer: Self-pay

## 2015-08-18 NOTE — Patient Outreach (Signed)
Transition of care call: Placed call to patient. No answer. Left a message requesting a call back.  Plan: Will await for a call back.  Will continue to outreach patient weekly.  Tomasa Rand, RN, BSN, CEN [facility] [employer] [phone]

## 2015-08-25 ENCOUNTER — Other Ambulatory Visit: Payer: Self-pay

## 2015-08-26 NOTE — Patient Outreach (Signed)
Transition of care call: Placed call to patient. No answer  PLAN: Left a message requesting a call back.  Will continue to outreach.  Tomasa Rand, RN, BSN, CEN [facility] [employer] [phone]

## 2015-08-30 ENCOUNTER — Other Ambulatory Visit: Payer: Self-pay

## 2015-08-30 NOTE — Patient Outreach (Signed)
Final transition of care call: Placed call to patient at home and cell with no answer. Placed call to wife and she answered. Wife reports that patient is doing well. Reports CBG levels around 120 fasting in the mornings.  Reports no new problems or concerns.  Reports depression is decreased. Wife reports that patient is doing very well.  PLAN: discussed options moving forward as patient has successfully completed transition of care program.  Wife reports that they do not need any assistance at this time but would call for future problems.  Will plan to close case. Will send letter to patient. Will notify MD of case closure.  [facility] CM Care Plan Problem One        Most Recent Value   Care Plan Problem One  Recent admission for back surgery   Role Documenting the Problem One  Care Management Coordinator   Care Plan for Problem One  Active   [facility] Long Term Goal (31-90 days)  Patient will report no readmissions in the next 31 days.   THN Long Term Goal Start Date  07/29/15   [facility] Long Term Goal Met Date  08/30/15 [goal met]   Interventions for Problem One Long Term Goal  Encouraged patient to call MD for any concerns or problems   [facility] CM Short Term Goal #1 (0-30 days)  Patient will verbalize attending follow up hospital MD appointment in the next 10 days   [facility] CM Short Term Goal #1 Start Date  07/29/15   [facility] CM Short Term Goal #1 Met Date  08/02/15 [goal met]   Interventions for Short Term Goal #1  Encouraged wife to make appointment asap. Offered to assist but wife is awaitng a call back from MD today.   [facility] CM Short Term Goal #2 (0-30 days)  Patient and or wife will confirm clairifcation of discharged medidations in the next 5 days.   [facility] CM Short Term Goal #2 Start Date  07/29/15   [facility] CM Short Term Goal #2 Met Date  08/02/15 [goal met]   Interventions for Short Term Goal #2  Offered to assist however wife pending call back. Enocuraged wife or patient to call me for questions or concerns. home  visit planned for 08/02/2015    [facility] CM Care Plan Problem Two        Most Recent Value   Care Plan Problem Two  Hgb A1c not at goal   Role Documenting the Problem Two  Care Management Coordinator   Care Plan for Problem Two  Active   [facility] CM Short Term Goal #1 (0-30 days)  Patient will record CBG levels in [facility] calendar for the next 3 weeks.   [facility] CM Short Term Goal #1 Start Date  08/02/15   [facility] CM Short Term Goal #1 Met Date   08/30/15 [goal met. Wife reports recording CBG levels daily]   Interventions for Short Term Goal #2   Wife reports [employer] nurse did home visit and Hgb A1c was down to 6.3.  Patient is monitoring CBG once a day and recording per wife.      Amanda Cook, RN, BSN, CEN [facility] Community Care Coordinator [phone] 

## 2015-10-29 ENCOUNTER — Other Ambulatory Visit: Payer: Self-pay | Admitting: Neurosurgery

## 2015-10-29 DIAGNOSIS — M5126 Other intervertebral disc displacement, lumbar region: Secondary | ICD-10-CM

## 2015-11-03 ENCOUNTER — Encounter (HOSPITAL_COMMUNITY): Payer: Self-pay | Admitting: *Deleted

## 2015-11-03 ENCOUNTER — Emergency Department (HOSPITAL_COMMUNITY)
Admission: EM | Admit: 2015-11-03 | Discharge: 2015-11-04 | Disposition: A | Discharge status: Home / Self Care | Payer: Medicare Other | Attending: Emergency Medicine | Admitting: Emergency Medicine

## 2015-11-03 DIAGNOSIS — Z955 Presence of coronary angioplasty implant and graft: Secondary | ICD-10-CM | POA: Diagnosis not present

## 2015-11-03 DIAGNOSIS — M545 Low back pain, unspecified: Secondary | ICD-10-CM

## 2015-11-03 DIAGNOSIS — I10 Essential (primary) hypertension: Secondary | ICD-10-CM | POA: Insufficient documentation

## 2015-11-03 DIAGNOSIS — F17210 Nicotine dependence, cigarettes, uncomplicated: Secondary | ICD-10-CM | POA: Insufficient documentation

## 2015-11-03 DIAGNOSIS — I2510 Atherosclerotic heart disease of native coronary artery without angina pectoris: Secondary | ICD-10-CM | POA: Insufficient documentation

## 2015-11-03 DIAGNOSIS — G8929 Other chronic pain: Secondary | ICD-10-CM | POA: Insufficient documentation

## 2015-11-03 DIAGNOSIS — E119 Type 2 diabetes mellitus without complications: Secondary | ICD-10-CM | POA: Diagnosis not present

## 2015-11-03 DIAGNOSIS — Z7984 Long term (current) use of oral hypoglycemic drugs: Secondary | ICD-10-CM | POA: Diagnosis not present

## 2015-11-03 DIAGNOSIS — Z7982 Long term (current) use of aspirin: Secondary | ICD-10-CM | POA: Diagnosis not present

## 2015-11-03 DIAGNOSIS — Z8546 Personal history of malignant neoplasm of prostate: Secondary | ICD-10-CM | POA: Insufficient documentation

## 2015-11-03 NOTE — ED Notes (Signed)
Pt states he was at work and bent over and started experiencing back pain. States he took 2 percocet for the pain with no relief.
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
## 2015-11-04 MED ORDER — METHOCARBAMOL 500 MG PO TABS
500.0000 mg | ORAL_TABLET | Freq: Once | ORAL | Status: AC
  Administered 2015-11-04: 500 mg via ORAL
  Filled 2015-11-04: qty 1

## 2015-11-04 MED ORDER — OXYCODONE HCL 5 MG PO TABS: ORAL_TABLET | ORAL | Status: DC

## 2015-11-04 MED ORDER — OXYCODONE HCL 5 MG PO TABS
10.0000 mg | ORAL_TABLET | Freq: Once | ORAL | Status: DC
  Filled 2015-11-04: qty 2

## 2015-11-04 MED ORDER — METHOCARBAMOL 500 MG PO TABS: ORAL_TABLET | ORAL | Status: DC

## 2015-11-04 MED ORDER — LIDOCAINE 5 % EX PTCH: 1.0000 | MEDICATED_PATCH | CUTANEOUS | Status: DC

## 2015-11-04 MED ORDER — HYDROMORPHONE HCL 1 MG/ML IJ SOLN
0.5000 mg | Freq: Once | INTRAMUSCULAR | Status: AC
  Administered 2015-11-04: 0.5 mg via INTRAVENOUS
  Filled 2015-11-04: qty 1

## 2015-11-04 MED ORDER — OXYCODONE-ACETAMINOPHEN 5-325 MG PO TABS: ORAL_TABLET | ORAL | Status: DC

## 2015-11-04 MED ORDER — HYDROMORPHONE HCL 1 MG/ML IJ SOLN
2.0000 mg | Freq: Once | INTRAMUSCULAR | Status: AC
  Administered 2015-11-04: 2 mg via INTRAVENOUS
  Filled 2015-11-04: qty 2

## 2015-11-04 MED ORDER — LIDOCAINE 5 % EX PTCH
1.0000 | MEDICATED_PATCH | Freq: Once | CUTANEOUS | Status: DC
  Administered 2015-11-04: 1 via TRANSDERMAL
  Filled 2015-11-04: qty 1

## 2015-11-04 NOTE — ED Notes (Signed)
Pt ambulated with this RN down the hall and back. Pt struggled with position changed but walked independently.

## 2015-11-04 NOTE — Discharge Instructions (Signed)
Take robaxin and/or oxycodone for breakthrough pain, do not drink alcohol, drive, care for children or perfom other critical tasks while taking robaxin and/or oxycodone.  Please be very careful not to fall!  Please follow with your primary care doctor in the next 2 days for a check-up. They must obtain records for further management.   Do not hesitate to return to the Emergency Department for any new, worsening or concerning symptoms.    Back Pain, Adult Back pain is very common in adults.The cause of back pain is rarely dangerous and the pain often gets better over time.The cause of your back pain may not be known. Some common causes of back pain include:  Strain of the muscles or ligaments supporting the spine.  Wear and tear (degeneration) of the spinal disks.  Arthritis.  Direct injury to the back. For many people, back pain may return. Since back pain is rarely dangerous, most people can learn to manage this condition on their own. HOME CARE INSTRUCTIONS Watch your back pain for any changes. The following actions may help to lessen any discomfort you are feeling:  Remain active. It is stressful on your back to sit or stand in one place for long periods of time. Do not sit, drive, or stand in one place for more than 30 minutes at a time. Take short walks on even surfaces as soon as you are able.Try to increase the length of time you walk each day.  Exercise regularly as directed by your health care provider. Exercise helps your back heal faster. It also helps avoid future injury by keeping your muscles strong and flexible.  Do not stay in bed.Resting more than 1-2 days can delay your recovery.  Pay attention to your body when you bend and lift. The most comfortable positions are those that put less stress on your recovering back. Always use proper lifting techniques, including:  Bending your knees.  Keeping the load close to your body.  Avoiding twisting.  Find a  comfortable position to sleep. Use a firm mattress and lie on your side with your knees slightly bent. If you lie on your back, put a pillow under your knees.  Avoid feeling anxious or stressed.Stress increases muscle tension and can worsen back pain.It is important to recognize when you are anxious or stressed and learn ways to manage it, such as with exercise.  Take medicines only as directed by your health care provider. Over-the-counter medicines to reduce pain and inflammation are often the most helpful.Your health care provider may prescribe muscle relaxant drugs.These medicines help dull your pain so you can more quickly return to your normal activities and healthy exercise.  Apply ice to the injured area:  Put ice in a plastic bag.  Place a towel between your skin and the bag.  Leave the ice on for 20 minutes, 2-3 times a day for the first 2-3 days. After that, ice and heat may be alternated to reduce pain and spasms.  Maintain a healthy weight. Excess weight puts extra stress on your back and makes it difficult to maintain good posture. SEEK MEDICAL CARE IF:  You have pain that is not relieved with rest or medicine.  You have increasing pain going down into the legs or buttocks.  You have pain that does not improve in one week.  You have night pain.  You lose weight.  You have a fever or chills. SEEK IMMEDIATE MEDICAL CARE IF:   You develop new bowel or bladder  control problems.  You have unusual weakness or numbness in your arms or legs.  You develop nausea or vomiting.  You develop abdominal pain.  You feel faint.   This information is not intended to replace advice given to you by your health care provider. Make sure you discuss any questions you have with your health care provider.   Document Released: 04/09/2005 Document Revised: 04/30/2014 Document Reviewed: 08/11/2013 Elsevier Interactive Patient Education [employer].

## 2015-11-04 NOTE — ED Notes (Signed)
Patient Alert and oriented X4. Stable and ambulatory. Patient verbalized understanding of the discharge instructions.  Patient belongings were taken by the patient. Advised to take the next few days off.  Patient made aware to change lidocaine patch in 12 hrs and allow to have 12hrs between doses

## 2015-11-04 NOTE — ED Notes (Signed)
Patient stated that his primary care doctor Trenton Gammon told him to come to the ED and he would meet him here and get an IV started to give him pain relief.  Patient states he is still in severe pain.

## 2015-11-08 ENCOUNTER — Ambulatory Visit
Admission: RE | Admit: 2015-11-08 | Discharge: 2015-11-08 | Disposition: A | Discharge status: Home / Self Care | Payer: Medicare Other | Source: Ambulatory Visit | Attending: Neurosurgery | Admitting: Neurosurgery

## 2015-11-08 DIAGNOSIS — M5126 Other intervertebral disc displacement, lumbar region: Secondary | ICD-10-CM

## 2015-11-08 MED ORDER — GADOBENATE DIMEGLUMINE 529 MG/ML IV SOLN: 17.0000 mL | Freq: Once | INTRAVENOUS | Status: DC | PRN

## 2015-11-09 ENCOUNTER — Other Ambulatory Visit: Payer: Self-pay | Admitting: Neurosurgery

## 2015-11-10 ENCOUNTER — Other Ambulatory Visit (HOSPITAL_COMMUNITY): Payer: Self-pay | Admitting: Neurology

## 2015-11-10 ENCOUNTER — Ambulatory Visit (HOSPITAL_COMMUNITY)
Admission: RE | Admit: 2015-11-10 | Discharge: 2015-11-10 | Disposition: A | Discharge status: Home / Self Care | Payer: Medicare Other | Source: Ambulatory Visit | Attending: Neurosurgery | Admitting: Neurosurgery

## 2015-11-10 DIAGNOSIS — I878 Other specified disorders of veins: Secondary | ICD-10-CM | POA: Diagnosis not present

## 2015-11-10 DIAGNOSIS — M4640 Discitis, unspecified, site unspecified: Secondary | ICD-10-CM

## 2015-11-10 MED ORDER — LIDOCAINE HCL 1 % IJ SOLN
INTRAMUSCULAR | Status: AC
  Filled 2015-11-10: qty 20

## 2015-11-10 MED ORDER — HEPARIN SOD (PORK) LOCK FLUSH 100 UNIT/ML IV SOLN
INTRAVENOUS | Status: AC
  Filled 2015-11-10: qty 5

## 2015-11-10 NOTE — Procedures (Signed)
PICC line successfully placed in right basilic vein.  45cm in length with single lumen catheter.  Placement confirmed at superior cavoatrial junction. No immediate complications.   Ready for immediate use.  Arrie Borrelli E 12:28 PM 11/10/2015

## 2015-11-28 IMAGING — NM NM BONE WHOLE BODY
2 series · 2 of 2 positions shown · non-contrast
Comparison: CT 10/29/2014; bone scan 10/15/2013.

CLINICAL DATA: Patient with history prostate cancer. PSA 1.59. No
bone pain or recent trauma.

EXAM:
NUCLEAR MEDICINE WHOLE BODY BONE SCAN
TECHNIQUE: Whole body anterior and posterior images were obtained approximately
3 hours after intravenous injection of radiopharmaceutical.
RADIOPHARMACEUTICALS:  27.5 mCi Cechnetium-HHm MDP IV

[Series 1: wbr_bone_40 whole body · 2.66mm/px · 1 of 1 slices shown (1 of 2)]
[im 1/1]
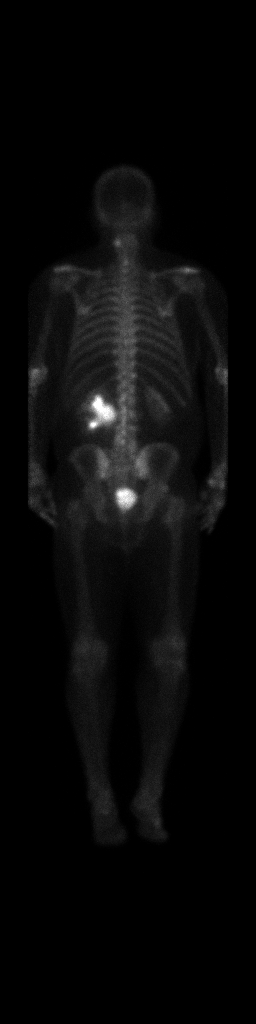

[Series 1: wbr_bone_40 whole body · 2.66mm/px · 1 of 1 slices shown (2 of 2)]
[im 1/1]
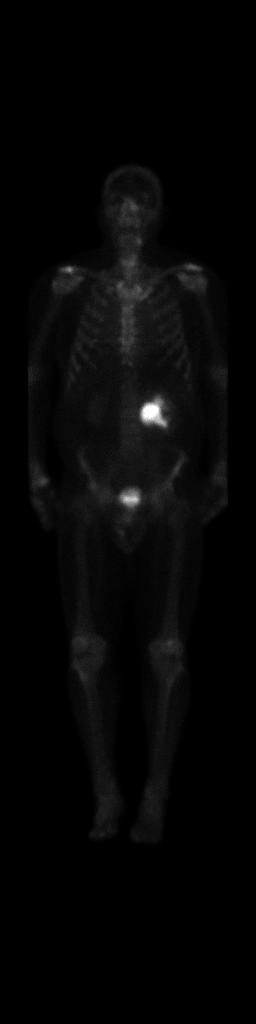

[2 of 2 positions shown; findings below may reference images not displayed]

FINDINGS: Interval development of a focal area of increased radiotracer uptake
within the left aspect of the cervical spine. Additionally there is
suggestion of increased radiotracer uptake along the anterior aspect
of the left first rib. Probable degenerative changes with increased
radiotracer uptake involving the bilateral AC joints. Physiologic
activity demonstrated within the kidneys with left-sided
hydronephrosis.
IMPRESSION: Interval development of a focus of increased radiotracer uptake
within the left aspect of the cervical spine, potentially secondary
to degenerative change. Metastatic lesion to this location is not
excluded.

Increased radiotracer uptake along the anterior aspect of the left
first rib. Metastatic disease not excluded.

Re- demonstrated severe left hydronephrosis.

## 2015-12-06 ENCOUNTER — Other Ambulatory Visit: Payer: Self-pay | Admitting: Urology

## 2015-12-06 DIAGNOSIS — C61 Malignant neoplasm of prostate: Secondary | ICD-10-CM

## 2015-12-08 ENCOUNTER — Other Ambulatory Visit: Payer: Self-pay | Admitting: Neurosurgery

## 2015-12-08 DIAGNOSIS — M5126 Other intervertebral disc displacement, lumbar region: Secondary | ICD-10-CM

## 2015-12-20 ENCOUNTER — Ambulatory Visit
Admission: RE | Admit: 2015-12-20 | Discharge: 2015-12-20 | Disposition: A | Discharge status: Home / Self Care | Payer: Medicare Other | Source: Ambulatory Visit | Attending: Neurosurgery | Admitting: Neurosurgery

## 2015-12-20 DIAGNOSIS — M5126 Other intervertebral disc displacement, lumbar region: Secondary | ICD-10-CM

## 2015-12-20 MED ORDER — GADOBENATE DIMEGLUMINE 529 MG/ML IV SOLN
16.0000 mL | Freq: Once | INTRAVENOUS | Status: AC | PRN
  Administered 2015-12-20: 16 mL via INTRAVENOUS

## 2016-01-11 ENCOUNTER — Encounter (HOSPITAL_COMMUNITY)
Admission: RE | Admit: 2016-01-11 | Discharge: 2016-01-11 | Disposition: A | Discharge status: Home / Self Care | Payer: Medicare Other | Source: Ambulatory Visit | Attending: Urology | Admitting: Urology

## 2016-01-11 ENCOUNTER — Ambulatory Visit (HOSPITAL_COMMUNITY)
Admission: RE | Admit: 2016-01-11 | Discharge: 2016-01-11 | Disposition: A | Discharge status: Home / Self Care | Payer: Medicare Other | Source: Ambulatory Visit | Attending: Urology | Admitting: Urology

## 2016-01-11 DIAGNOSIS — C61 Malignant neoplasm of prostate: Secondary | ICD-10-CM | POA: Diagnosis present

## 2016-01-11 DIAGNOSIS — R948 Abnormal results of function studies of other organs and systems: Secondary | ICD-10-CM | POA: Diagnosis not present

## 2016-01-11 MED ORDER — TECHNETIUM TC 99M MEDRONATE IV KIT
21.1000 | PACK | Freq: Once | INTRAVENOUS | Status: AC | PRN
  Administered 2016-01-11: 21.1 via INTRAVENOUS

## 2016-02-06 ENCOUNTER — Ambulatory Visit (INDEPENDENT_AMBULATORY_CARE_PROVIDER_SITE_OTHER): Payer: Medicare Other | Admitting: Internal Medicine

## 2016-02-06 ENCOUNTER — Encounter: Payer: Self-pay | Admitting: Internal Medicine

## 2016-02-06 VITALS — BP 118/66 | HR 97 | Ht 70.0 in | Wt 189.7 lb

## 2016-02-06 DIAGNOSIS — F17210 Nicotine dependence, cigarettes, uncomplicated: Secondary | ICD-10-CM

## 2016-02-06 DIAGNOSIS — J8410 Pulmonary fibrosis, unspecified: Secondary | ICD-10-CM

## 2016-02-06 NOTE — Patient Instructions (Addendum)
The key is to stop smoking completely before smoking completely stops you!   Schedule a full set of PFTs at [facility] in the next 4 weeks  Return around March 20th 2018 with  HRCT Chest to be done the week prior and a 6 min walk the week of your visit   GERD (REFLUX)  is an extremely common cause of respiratory symptoms just like yours , many times with no obvious heartburn at all.    It can be treated with medication, but also with lifestyle changes including elevation of the head of your bed (ideally with 6 inch  bed blocks),  Smoking cessation, avoidance of late meals, excessive alcohol, and avoid fatty foods, chocolate, peppermint, colas, red wine, and acidic juices such as orange juice.  NO MINT OR MENTHOL PRODUCTS SO NO COUGH DROPS  USE SUGARLESS CANDY INSTEAD (Jolley ranchers or Stover's or Life Savers) or even ice chips will also do - the key is to swallow to prevent all throat clearing. NO OIL BASED VITAMINS - use powdered substitutes.
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
## 2016-02-07 DIAGNOSIS — J8410 Pulmonary fibrosis, unspecified: Secondary | ICD-10-CM | POA: Insufficient documentation

## 2016-02-07 NOTE — Assessment & Plan Note (Signed)
>   3 min discussion  I emphasized that although we never turn away smokers from the pulmonary clinic, we do ask that they understand that the recommendations that we make  won't work nearly as well in the presence of continued cigarette exposure which puts him at direct risk of 3 different forms of PF- (see separate a/p)    In fact, we may very well  reach a point where we can't promise to help the patient if he/she can't quit smoking. (We can and will promise to try to help, we just can't promise what we recommend will really work)

## 2016-02-07 NOTE — Assessment & Plan Note (Addendum)
See CT abd 01/11/16  - 02/06/2016  Walked RA x 3 laps @ 185 ft each stopped due to  End of study, fast pace, no sob or desat     DDx for pulmonary fibrosis  includes idiopathic pulmonary fibrosis, pulmonary fibrosis associated with rheumatologic diseases (which have a relatively benign course in most cases) , adverse effect from  drugs such as chemotherapy or amiodarone exposure, nonspecific interstitial pneumonia which is typically steroid responsive, and chronic hypersensitivity pneumonitis.   In active  smokers Langerhan's Cell  Histiocyctosis (eosinophilic granuomatosis),  DIP,  and Respiratory Bronchiolitis ILD also need to be considered,  And should be high on his list for that reason  The main rx for now is GERD diet and avoidance of cigs until he return for baseline pfts then consider further empirical rx (ppi/h2 hs based on Talmadge King's studies that show benefit in many PF pts) and return for HRCT / 6 m walks in 6 months intervals to get more points on the curve  Discussed in detail all the  indications, usual  risks and alternatives  relative to the benefits with patient and wife who  to proceed with conservative f/u as outlined   Total time devoted to counseling  = 35/32mreview case with pt/ discussion of options/alternatives/ personally creating written instructions  in presence of pt  then going over those specific  Instructions directly with the pt including how to use all of the meds but in particular covering each new medication in detail and the difference between the maintenance/automatic meds and the prns using an action plan format for the latter.

## 2016-02-20 ENCOUNTER — Ambulatory Visit (HOSPITAL_COMMUNITY)
Admission: RE | Admit: 2016-02-20 | Discharge: 2016-02-20 | Disposition: A | Discharge status: Home / Self Care | Payer: Medicare Other | Source: Ambulatory Visit | Attending: Internal Medicine | Admitting: Internal Medicine

## 2016-02-20 DIAGNOSIS — J8410 Pulmonary fibrosis, unspecified: Secondary | ICD-10-CM | POA: Diagnosis not present

## 2016-02-20 LAB — PULMONARY FUNCTION TEST
DL/VA % pred: 67 %
DL/VA: 3.11 ml/min/mmHg/L
DLCO unc % pred: 60 %
DLCO unc: 19.48 ml/min/mmHg
FEF 25-75 Post: 3.4 L/sec
FEF 25-75 Pre: 2.33 L/sec
FEF2575-%Change-Post: 45 %
FEF2575-%Pred-Post: 145 %
FEF2575-%Pred-Pre: 100 %
FEV1-%Change-Post: 5 %
FEV1-%Pred-Post: 100 %
FEV1-%Pred-Pre: 94 %
FEV1-Post: 3.16 L
FEV1-Pre: 2.98 L
FEV1FVC-%Change-Post: 1 %
FEV1FVC-%Pred-Pre: 103 %
FEV6-%Change-Post: 5 %
FEV6-%Pred-Post: 96 %
FEV6-%Pred-Pre: 91 %
FEV6-Post: 3.92 L
FEV6-Pre: 3.72 L
FEV6FVC-%Change-Post: 0 %
FEV6FVC-%Pred-Post: 101 %
FEV6FVC-%Pred-Pre: 100 %
FVC-%Change-Post: 4 %
FVC-%Pred-Post: 95 %
FVC-%Pred-Pre: 91 %
FVC-Post: 4.12 L
FVC-Pre: 3.94 L
Post FEV1/FVC ratio: 77 %
Post FEV6/FVC ratio: 95 %
Pre FEV1/FVC ratio: 76 %
Pre FEV6/FVC Ratio: 94 %
RV % pred: 106 %
RV: 2.68 L
TLC % pred: 90 %
TLC: 6.39 L

## 2016-02-20 MED ORDER — ALBUTEROL SULFATE (2.5 MG/3ML) 0.083% IN NEBU
2.5000 mg | INHALATION_SOLUTION | Freq: Once | RESPIRATORY_TRACT | Status: AC
  Administered 2016-02-20: 2.5 mg via RESPIRATORY_TRACT

## 2016-06-14 DIAGNOSIS — Z961 Presence of intraocular lens: Secondary | ICD-10-CM | POA: Insufficient documentation

## 2016-06-14 DIAGNOSIS — E119 Type 2 diabetes mellitus without complications: Secondary | ICD-10-CM | POA: Insufficient documentation

## 2016-07-05 ENCOUNTER — Telehealth: Payer: Self-pay | Admitting: Internal Medicine

## 2016-07-05 NOTE — Telephone Encounter (Signed)
Received records from [facility] for appointment on 07/16/16 with Dr Debara Pickett.  Records put with Dr Lysbeth Penner schedule for 07/16/16. lp

## 2016-07-12 ENCOUNTER — Ambulatory Visit: Payer: Medicare Other | Admitting: Internal Medicine

## 2016-07-16 ENCOUNTER — Ambulatory Visit (INDEPENDENT_AMBULATORY_CARE_PROVIDER_SITE_OTHER): Payer: Medicare Other | Admitting: Internal Medicine

## 2016-07-16 ENCOUNTER — Encounter: Payer: Self-pay | Admitting: Internal Medicine

## 2016-07-16 VITALS — BP 162/72 | HR 55 | Ht 70.0 in | Wt 188.4 lb

## 2016-07-16 DIAGNOSIS — E782 Mixed hyperlipidemia: Secondary | ICD-10-CM | POA: Diagnosis not present

## 2016-07-16 DIAGNOSIS — I2510 Atherosclerotic heart disease of native coronary artery without angina pectoris: Secondary | ICD-10-CM | POA: Diagnosis not present

## 2016-07-16 DIAGNOSIS — Z8679 Personal history of other diseases of the circulatory system: Secondary | ICD-10-CM

## 2016-07-16 DIAGNOSIS — I10 Essential (primary) hypertension: Secondary | ICD-10-CM | POA: Diagnosis not present

## 2016-07-16 DIAGNOSIS — Z9889 Other specified postprocedural states: Secondary | ICD-10-CM | POA: Diagnosis not present
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
## 2016-07-16 NOTE — Patient Instructions (Signed)
Your physician wants you to follow-up in: ONE YEAR with Dr. Hilty. You will receive a reminder letter in the mail two months in advance. If you don't receive a letter, please call our office to schedule the follow-up appointment.  

## 2016-08-06 ENCOUNTER — Encounter: Payer: Self-pay | Admitting: Internal Medicine

## 2016-08-06 ENCOUNTER — Ambulatory Visit (INDEPENDENT_AMBULATORY_CARE_PROVIDER_SITE_OTHER): Payer: Medicare Other | Admitting: Internal Medicine

## 2016-08-06 VITALS — BP 138/78 | HR 69 | Ht 70.0 in | Wt 191.2 lb

## 2016-08-06 DIAGNOSIS — F17210 Nicotine dependence, cigarettes, uncomplicated: Secondary | ICD-10-CM | POA: Diagnosis not present

## 2016-08-06 DIAGNOSIS — J8410 Pulmonary fibrosis, unspecified: Secondary | ICD-10-CM

## 2016-08-06 NOTE — Progress Notes (Signed)
Subjective:    Patient ID: Cody Moss, male    DOB: 07-25-1940,    MRN: [id number]    Brief patient profile:  48 yowm active smoker with prostate ca s/p prostatectomy / RT 2004 with serial f/u by Dr [name] who referred him to pulmonary clinic 02/06/2016 for eval of abn CT c/w PF in bases 01/11/16   No h/o chemo exposures,  ALI, macrodantin or Connective tissue dz or inflammatory bowel dz    History of Present Illness  02/06/2016 1st [facility] office visit/ Shellee Streng   Chief Complaint  Patient presents with  . Advice Only    Referred by [name], Pt. states he had a bone density scan was told to follow up here, Pt. denies SOB, wheezing,chest pain or chest tightness, Pt. 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  Schedule a full set of PFTs at [facility] in the next 4 weeks  GERD diet     08/06/2016  f/u ov/Anuj Summons re:  Active smoker / ? PF  Some cough due to pnds attributed to allergies/ zyrtec  Chief Complaint  Patient presents with  . Follow-up    Pt breathing remains unchanged, pt had a pft 02/10/16,   no change in ex tol, not convinced yet he needs to stop smoking   No obvious day to day or daytime variability or assoc excess/ purulent sputum or mucus plugs or hemoptysis or cp or chest tightness, subjective wheeze or overt sinus or hb symptoms. No unusual exp hx or h/o childhood pna/ asthma or knowledge of premature birth.  Sleeping ok without nocturnal  or early am exacerbation  of respiratory  c/o's or need for noct saba. Also denies any obvious fluctuation of symptoms with weather or environmental changes or other aggravating or alleviating factors except as outlined above   Current Medications, Allergies, Complete Past Medical History, Past Surgical History, Family History, and Social History were reviewed in [employer] record.  ROS  The following are not active complaints unless bolded sore throat, dysphagia, dental problems, itching, sneezing,  nasal congestion or excess/ purulent secretions, ear ache,   fever, chills, sweats, unintended wt loss, classically pleuritic or exertional cp,  orthopnea pnd or leg swelling, presyncope, palpitations, abdominal pain, anorexia, nausea, vomiting, diarrhea  or change in bowel or bladder habits, change in stools or urine, dysuria,hematuria,  rash, arthralgias, visual complaints, headache, numbness, weakness or ataxia or problems with walking or coordination,  change in mood/affect or memory.                 Objective:   Physical Exam  amb wm nad    08/06/2016      191   02/06/16 189 lb 11.2 oz (86 kg)  08/02/15 196 lb (88.9 kg)  07/28/15 197 lb 8 oz (89.6 kg)    Vital signs reviewed - Note on arrival 02 sats  99% on RA     HEENT: nl   turbinates, and oropharynx. Nl external ear canals without cough reflex - endentulous    NECK :  without JVD/Nodes/TM/ nl carotid upstrokes bilaterally   LUNGS: no acc muscle use,  Nl contour chest which is clear to A and P bilaterally without cough on insp or exp maneuvers   CV:  RRR  no s3 or murmur or increase  in P2, no edema   ABD:  soft and nontender with nl inspiratory excursion in the supine position. No bruits or organomegaly, bowel sounds nl  MS:  Nl gait/ ext warm without deformities, calf tenderness, cyanosis or clubbing No obvious joint restrictions   SKIN: warm and dry without lesions    NEURO:  alert, approp, nl sensorium with  no motor deficits             Assessment & Plan:

## 2016-08-06 NOTE — Assessment & Plan Note (Signed)

## 2016-08-06 NOTE — Assessment & Plan Note (Addendum)
See CT abd 01/11/16  - 02/06/2016  Walked RA x 3 laps @ 185 ft each stopped due to  End of study, fast pace, no sob or desat    - PFT's  02/20/16   FVC  3.94 (91%) s obst and s rx  prior to study with DLCO  60 % corrects to 67  % for alv volume   - 08/06/2016 Walked RA x 3 laps @ 185 ft each stopped due to  End of study, fast pace, no sob or desat    - HRCT 08/06/2016 >>>    No evidence at all of progression despite still smoking with same ddx as previous ov and needs HRCT to sort out and give some prognostic info to pt but priority for now is to commit to quit (see separate a/p)  - absence of clubbing does argue against uip clinically and 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   Discussed in detail all the  indications, usual  risks and alternatives  relative to the benefits with patient who agrees to proceed with w/u as outlined which will include HRCT now and serial sats/ pfts   I had an extended discussion with the patient/wife reviewing all relevant studies completed to date and  lasting 15 to 20 minutes of a 25 minute visit    Each maintenance medication was reviewed in detail including most importantly the difference between maintenance and prns and under what circumstances the prns are to be triggered using an action plan format that is not reflected in the computer generated alphabetically organized AVS.    Please see AVS for specific instructions unique to this visit that I personally wrote and verbalized to the the pt in detail and then reviewed with pt  by my nurse highlighting any  changes in therapy recommended at today's visit to their plan of care.

## 2016-08-06 NOTE — Patient Instructions (Addendum)
Please see patient coordinator before you leave today  to schedule HRCT at your convenience   The key is to stop smoking completely before smoking completely stops you!    Please schedule a follow up visit in 6  months but call sooner if needed with repeat PFTs

## 2016-08-14 ENCOUNTER — Ambulatory Visit (INDEPENDENT_AMBULATORY_CARE_PROVIDER_SITE_OTHER)
Admission: RE | Admit: 2016-08-14 | Discharge: 2016-08-14 | Disposition: A | Discharge status: Home / Self Care | Payer: Medicare Other | Source: Ambulatory Visit | Attending: Internal Medicine | Admitting: Internal Medicine

## 2016-08-14 DIAGNOSIS — J8410 Pulmonary fibrosis, unspecified: Secondary | ICD-10-CM

## 2016-08-16 ENCOUNTER — Telehealth: Payer: Self-pay | Admitting: Internal Medicine

## 2016-08-16 NOTE — Progress Notes (Signed)
LMTCB

## 2016-08-16 NOTE — Progress Notes (Signed)
Pt aware.

## 2016-08-16 NOTE — Telephone Encounter (Signed)
Call patient : See previous entry and modify it to say keep prev appt for 01/2017     CT Chest High Resolution  Order: [id number]  Status:  Final result Visible to patient:  No (Inaccessible in [place]) Dx:  Postinflammatory pulmonary fibrosis (...  Notes recorded by Jannette Spanner, CMA on 08/16/2016 at 2:26 PM EDT ATC pt. Left message for pt to call back.  ------  Notes recorded by Rosana Berger, CMA on 08/16/2016 at 10:24 AM EDT [facility] ------  Notes recorded by Tanda Rockers, MD on 08/16/2016 at 9:25 AM EDT Call patient : See previous entry and modify it to say keep prev appt for 01/2017   ------  Notes recorded by Tanda Rockers, MD on 08/16/2016 at 9:25 AM EDT Call patient : Study is not diagnostic but supports very mild fibrotic changes that may be related to smoking > f/u can be yearly unless new symptoms    Spoke with pt and notified of results per Dr. Melvyn Novas. Pt verbalized understanding and denied any questions.

## 2016-10-29 ENCOUNTER — Other Ambulatory Visit: Payer: Self-pay | Admitting: Urology

## 2016-10-29 DIAGNOSIS — C61 Malignant neoplasm of prostate: Secondary | ICD-10-CM

## 2017-01-14 ENCOUNTER — Encounter (HOSPITAL_COMMUNITY)
Admission: RE | Admit: 2017-01-14 | Discharge: 2017-01-14 | Disposition: A | Discharge status: Home / Self Care | Payer: Medicare Other | Source: Ambulatory Visit | Attending: Urology | Admitting: Urology

## 2017-01-14 DIAGNOSIS — C61 Malignant neoplasm of prostate: Secondary | ICD-10-CM | POA: Insufficient documentation

## 2017-01-14 MED ORDER — TECHNETIUM TC 99M MEDRONATE IV KIT
20.6000 | PACK | Freq: Once | INTRAVENOUS | Status: AC | PRN
Start: 2017-01-14 — End: 2017-01-14
  Administered 2017-01-14: 20.6 via INTRAVENOUS

## 2017-02-05 ENCOUNTER — Ambulatory Visit (INDEPENDENT_AMBULATORY_CARE_PROVIDER_SITE_OTHER): Payer: Medicare Other | Admitting: Internal Medicine

## 2017-02-05 ENCOUNTER — Encounter: Payer: Self-pay | Admitting: Internal Medicine

## 2017-02-05 VITALS — BP 142/76 | HR 55 | Ht 70.0 in | Wt 193.0 lb

## 2017-02-05 DIAGNOSIS — R918 Other nonspecific abnormal finding of lung field: Secondary | ICD-10-CM

## 2017-02-05 DIAGNOSIS — F17210 Nicotine dependence, cigarettes, uncomplicated: Secondary | ICD-10-CM | POA: Diagnosis not present

## 2017-02-05 DIAGNOSIS — J8410 Pulmonary fibrosis, unspecified: Secondary | ICD-10-CM

## 2017-02-05 LAB — PULMONARY FUNCTION TEST
DL/VA % pred: 79 %
DL/VA: 3.66 ml/min/mmHg/L
DLCO cor % pred: 63 %
DLCO cor: 20.65 ml/min/mmHg
DLCO unc % pred: 57 %
DLCO unc: 18.43 ml/min/mmHg
FEF 25-75 Post: 2.75 L/sec
FEF 25-75 Pre: 2.49 L/sec
FEF2575-%Change-Post: 10 %
FEF2575-%Pred-Post: 120 %
FEF2575-%Pred-Pre: 109 %
FEV1-%Change-Post: 1 %
FEV1-%Pred-Post: 92 %
FEV1-%Pred-Pre: 90 %
FEV1-Post: 2.88 L
FEV1-Pre: 2.83 L
FEV1FVC-%Change-Post: 4 %
FEV1FVC-%Pred-Pre: 106 %
FEV6-%Change-Post: -2 %
FEV6-%Pred-Post: 87 %
FEV6-%Pred-Pre: 89 %
FEV6-Post: 3.53 L
FEV6-Pre: 3.61 L
FEV6FVC-%Change-Post: 0 %
FEV6FVC-%Pred-Post: 105 %
FEV6FVC-%Pred-Pre: 105 %
FVC-%Change-Post: -2 %
FVC-%Pred-Post: 83 %
FVC-%Pred-Pre: 85 %
FVC-Post: 3.57 L
FVC-Pre: 3.64 L
Post FEV1/FVC ratio: 81 %
Post FEV6/FVC ratio: 99 %
Pre FEV1/FVC ratio: 78 %
Pre FEV6/FVC Ratio: 99 %
RV % pred: 90 %
RV: 2.28 L
TLC % pred: 83 %
TLC: 5.85 L

## 2017-02-05 NOTE — Progress Notes (Signed)
Subjective:    Patient ID: Cody Moss, male    DOB: Jan 07, 1941,    MRN: [id number]    Brief patient profile:  8 yowm active smoker with prostate ca s/p prostatectomy / RT 2004 with serial f/u by Dr Eulogio Moss who referred him to pulmonary clinic 02/06/2016 for eval of abn CT c/w PF in bases 01/11/16   No h/o chemo exposures,  ALI, macrodantin or Connective tissue dz or inflammatory bowel dz    History of Present Illness  02/06/2016 1st [facility] office visit/ Cody Moss   Chief Complaint  Patient presents with  . Advice Only    Referred by Cody Moss, Pt. states he had a bone density scan was told to follow up here, Pt. denies SOB, wheezing,chest pain or chest tightness, Pt. 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  Schedule a full set of PFTs at [facility] in the next 4 weeks  GERD diet     08/06/2016  f/u ov/Cody Moss re:  Active smoker / ? PF  Some cough due to pnds attributed to allergies/ rx zyrtec  Chief Complaint  Patient presents with  . 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 Follow-up    PFT' done today. He has some sinus congestion and "allergies"- taking zyrtec daily. He has a non prod cough. Breathing is unchanged since the last visit.    walking x 20 min s stopping slow pace Cough  attributes to pnds / better on zyrtec  Off gerd rx on maint basis x 3 weeks > no change  Cough or sob   No obvious day to day or daytime variability or assoc excess/ purulent sputum or mucus plugs or hemoptysis or cp or chest tightness, subjective wheeze or overt   hb symptoms. No unusual exp hx or h/o childhood pna/ asthma or knowledge of premature birth.  Sleeping ok flat without nocturnal  or early am exacerbation  of respiratory  c/o's or need for noct saba. Also denies any obvious fluctuation of symptoms with weather or environmental changes or other aggravating or alleviating factors except as outlined above   Current Allergies, Complete Past Medical History, Past Surgical History, Family History, and Social History were reviewed in [employer] record.  ROS  The following are not active complaints unless bolded Hoarseness, sore throat, dysphagia, dental problems, itching, sneezing,  nasal congestion or discharge of excess mucus or purulent secretions, ear ache,   fever, chills, sweats, unintended wt loss or wt gain, classically pleuritic or exertional cp,  orthopnea pnd or leg swelling, presyncope, palpitations, abdominal pain, anorexia, nausea, vomiting, diarrhea  or change in bowel habits or change in bladder habits, change in stools or change in urine, dysuria, hematuria,  rash, arthralgias, visual complaints, headache,  numbness, weakness or ataxia or problems with walking or coordination,  change in mood/affect or memory.        Current Meds  Medication Sig  . allopurinol (ZYLOPRIM) 300 MG tablet Take 300 mg by mouth daily.  Marland Kitchen aspirin EC 325 MG tablet Take 325 mg by mouth daily.  . bicalutamide (CASODEX) 50 MG tablet Take 50 mg by mouth at bedtime.   . Coenzyme Q10 (CO Q10 PO) Take 300 mg by mouth daily.  . colchicine 0.6 MG tablet Take 1 tablet (0.6 mg total) by mouth daily. (Patient taking differently: Take 0.6 mg by mouth daily as needed. For gout)  . docusate  sodium (COLACE) 100 MG capsule Take 100 mg by mouth 3 (three) times daily.  . feeding supplement, ENSURE ENLIVE, (ENSURE ENLIVE) LIQD Take 237 mLs by mouth 2 (two) times daily between meals.  . fenofibrate 160 MG tablet Take 160 mg by mouth every evening.  . finasteride (PROSCAR) 5 MG tablet Take 5 mg by mouth at bedtime.   . lidocaine (LIDODERM) 5 % Place 1 patch onto the skin daily. Remove & Discard patch within 12 hours or as directed by MD  . metFORMIN (GLUCOPHAGE) 1000 MG tablet Take 0.5 tablets (500 mg total) by mouth 2 (two) times daily with a meal.  . methocarbamol (ROBAXIN) 500 MG tablet 500 mg by mouth 3 times a day when necessary pain (Patient taking differently: 4 (four) times daily as needed. 500 mg by mouth 3 times a day when necessary pain)  . metoprolol (LOPRESSOR) 50 MG tablet Take 25 mg by mouth 2 (two) times daily.   Marland Kitchen olmesartan (BENICAR) 20 MG tablet Take 1 tablet by mouth daily.  Marland Kitchen oxyCODONE-acetaminophen (PERCOCET) 10-325 MG tablet Take 1 tablet by mouth every 4 (four) hours as needed for pain.  . pantoprazole (PROTONIX) 40 MG tablet Take 40 mg by mouth as needed (gerd, acid reflux).   . polyethylene glycol (MIRALAX / GLYCOLAX) packet Take 17 g by mouth daily. Reported on 08/02/2015  . pravastatin (PRAVACHOL) 20 MG tablet Take 20 mg by mouth every evening.  . ranitidine (ZANTAC) 75 MG tablet Take 150 mg by mouth at bedtime as needed for heartburn.                      Objective:   Physical Exam  amb wm nad    02/05/2017      193  08/06/2016      191   02/06/16 189 lb 11.2 oz (86 kg)  08/02/15 196 lb (88.9 kg)  07/28/15 197 lb 8 oz (89.6 kg)    Vital signs reviewed - Note on arrival 02 sats  97% on RA     HEENT: nl   turbinates, and oropharynx. Nl external ear canals without cough reflex - edentulous    NECK :  without JVD/Nodes/TM/ nl carotid upstrokes bilaterally   LUNGS: no acc muscle use,  Nl contour chest which is clear to A and P bilaterally  without cough on insp or exp maneuvers   CV:  RRR  no s3 or murmur or increase in P2, no edema   ABD:  soft and nontender with nl inspiratory excursion in the supine position. No bruits or organomegaly, bowel sounds nl  MS:  Nl gait/ ext warm without deformities, calf tenderness, cyanosis or clubbing No obvious joint restrictions   SKIN: warm and dry without lesions    NEURO:  alert, approp, nl sensorium with  no  motor deficits    CT chest at [facility] 01/14/17 mpns largest 8 mm rml with interval growth since 01/11/16              Assessment & Plan:

## 2017-02-05 NOTE — Patient Instructions (Signed)
PFT done today. 

## 2017-02-05 NOTE — Patient Instructions (Signed)
GERD (REFLUX)  is an extremely common cause of respiratory symptoms just like yours , many times with no obvious heartburn at all.    It can be treated with medication, but also with lifestyle changes including elevation of the head of your bed (ideally with 6 inch  bed blocks),  Smoking cessation, avoidance of late meals, excessive alcohol, and avoid fatty foods, chocolate, peppermint, colas, red wine, and acidic juices such as orange juice.  NO MINT OR MENTHOL PRODUCTS SO NO COUGH DROPS   USE SUGARLESS CANDY INSTEAD (Jolley ranchers or Stover's or Life Savers) or even ice chips will also do - the key is to swallow to prevent all throat clearing. NO OIL BASED VITAMINS - use powdered substitutes.   The key is to stop smoking completely before smoking completely stops you!    Please schedule a follow up visit in 6  months but call sooner if needed with 6 mn walk on return

## 2017-02-06 DIAGNOSIS — R918 Other nonspecific abnormal finding of lung field: Secondary | ICD-10-CM | POA: Insufficient documentation

## 2017-02-06 NOTE — Assessment & Plan Note (Signed)
CT results reviewed with pt >>> Too small for PET or bx, not suspicious enough for excisional bx > really only option for now is follow the Fleischner society guidelines as rec by radiology  Alternatively could rx for prostate ca as suggested by Dr Diona Fanti at [facility] 01/18/17 and see if these lesions shrink but either option is better pursuing tissue dx at this point  Discussed in detail all the  indications, usual  risks and alternatives  relative to the benefits with patient/wife who agree  to proceed with conservative f/u as outlined

## 2017-02-06 NOTE — Assessment & Plan Note (Signed)

## 2017-02-06 NOTE — Assessment & Plan Note (Signed)
See CT abd 01/11/16  - 02/06/2016  Walked RA x 3 laps @ 185 ft each stopped due to  End of study, fast pace, no sob or desat    - PFT's  02/20/16   FVC  3.94 (91%) s obst and s rx  prior to study with DLCO  60 % corrects to 67  % for alv volume   - 08/06/2016 Walked RA x 3 laps @ 185 ft each stopped due to  End of study, fast pace, no sob or desat    - HRCT 08/13/16 >>> The appearance of the chest is compatible with interstitial lung disease. 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), however, given the craniocaudal gradient, the possibility of early usual interstitial pneumonia (UIP) is not entirely excluded  PFT's  02/05/2017  FVC  3.64 (85%)  p no % improvement from saba p nothing  prior to study with DLCO  57/63  % corrects to 79  % for alv volume     Most important aspect of his care at this point is to stop smoking/ continue walking and monitor saturations/ evidence of progression clinically while on rx for GERD empirically based on Use of PPI is associated with improved survival time and with decreased radiologic fibrosis per King's study published in [facility] vol 184 p1390.  Dec 2011 and also may have other beneficial effects as per the latest review in [place] vol 193 Z3299 Jun 20016.  This may not always be cause and effect, but given how universally unimpressive and expensive  all the other  Drugs developed to day  have been for pf,   rec start  rx ppi / diet/ lifestyle modification and f/u with serial walking sats and lung volumes for now to put more points on the curve / establish firm baseline before considering additional measures.     I had an extended discussion with the patient reviewing all relevant studies completed to date and  lasting 15 to 20 minutes of a 25 minute visit    Each maintenance medication was reviewed in detail including most importantly the difference between maintenance and prns and under what circumstances the prns are  to be triggered using an action plan format that is not reflected in the computer generated alphabetically organized AVS.    Please see AVS for specific instructions unique to this visit that I personally wrote and verbalized to the the pt in detail and then reviewed with pt  by my nurse highlighting any  changes in therapy recommended at today's visit to their plan of care.

## 2017-08-06 ENCOUNTER — Ambulatory Visit: Payer: Medicare Other | Admitting: Internal Medicine

## 2017-08-06 ENCOUNTER — Ambulatory Visit (INDEPENDENT_AMBULATORY_CARE_PROVIDER_SITE_OTHER): Payer: Medicare Other | Admitting: *Deleted

## 2017-08-06 ENCOUNTER — Encounter: Payer: Self-pay | Admitting: Internal Medicine

## 2017-08-06 VITALS — BP 124/82 | Ht 69.5 in | Wt 191.2 lb

## 2017-08-06 DIAGNOSIS — J8410 Pulmonary fibrosis, unspecified: Secondary | ICD-10-CM

## 2017-08-06 DIAGNOSIS — F17210 Nicotine dependence, cigarettes, uncomplicated: Secondary | ICD-10-CM

## 2017-08-06 DIAGNOSIS — R918 Other nonspecific abnormal finding of lung field: Secondary | ICD-10-CM | POA: Diagnosis not present

## 2017-08-06 NOTE — Patient Instructions (Addendum)
Please see patient coordinator before you leave today  to schedule HRCT of Chest  And I will call you with results  The key is to stop smoking completely before smoking completely stops you!    Please schedule a follow up visit in 6  months but call sooner if needed with pfts on return

## 2017-08-06 NOTE — Assessment & Plan Note (Signed)
See CT abd 01/11/16  - 02/06/2016  Walked RA x 3 laps @ 185 ft each stopped due to  End of study, fast pace, no sob or desat    - PFT's  02/20/16   FVC  3.94 (91%) s obst and s rx  prior to study with DLCO  60 % corrects to 67  % for alv volume   - 08/06/2016 Walked RA x 3 laps @ 185 ft each stopped due to  End of study, fast pace, no sob or desat    - HRCT 08/13/16 >>> The appearance of the chest is compatible with interstitial lung disease. 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), however, given the craniocaudal gradient, the possibility of early usual interstitial pneumonia (UIP) is not entirely excluded  PFT's  02/05/2017  FVC  3.64 (85%)  p no % improvement from saba p nothing  prior to study with DLCO  57/63  % corrects to 79  % for alv volume    - 08/06/2017  53mw  = 384 no desats  - HRCT ordered 08/06/2017    No evidence clinically of progression despite active smoking against medical advice ((see separate a/p)    rec no change rx / f/u with HRCT now and full pfts in 6 months   Discussed in detail all the  indications, usual  risks and alternatives  relative to the benefits with patient who agrees to proceed with conservative f/u as outlined    I had an extended discussion with the patient reviewing all relevant studies completed to date and  lasting 15 to 20 minutes of a 25 minute visit    Each maintenance medication was reviewed in detail including most importantly the difference between maintenance and prns and under what circumstances the prns are to be triggered using an action plan format that is not reflected in the computer generated alphabetically organized AVS.    Please see AVS for specific instructions unique to this visit that I personally wrote and verbalized to the the pt in detail and then reviewed with pt  by my nurse highlighting any  changes in therapy recommended at today's visit to their plan of care.

## 2017-08-06 NOTE — Assessment & Plan Note (Signed)
>   3 min Discussed the risks and costs (both direct and indirect)  of smoking relative to the benefits of quitting but patient unwilling to commit at this point to a specific quit date.       

## 2017-08-06 NOTE — Progress Notes (Signed)
Subjective:    Patient ID: Cody Moss, male    DOB: Jun 17, 1940,    MRN: [id number]    Brief patient profile:  26 yowm active smoker with prostate ca s/p prostatectomy / RT 2004 with serial f/u by Dr [name] who referred him to pulmonary clinic 02/06/2016 for eval of abn CT c/w PF in bases 01/11/16   No h/o chemo exposures,  ALI, macrodantin or Connective tissue dz or inflammatory bowel dz    History of Present Illness  02/06/2016 1st [facility] office visit/ [facility]   Chief Complaint  Patient presents with  . Advice Only    Referred by [name], Pt. states he had a bone density scan was told to follow up here, Pt. denies SOB, wheezing,chest pain or chest tightness, Pt. 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  Schedule a full set of PFTs at [facility] in the next 4 weeks  GERD diet     08/06/2016  f/u ov/[facility]ert re:  Active smoker / ? PF  Some cough due to pnds attributed to allergies/ rx zyrtec  Chief Complaint  Patient presents with  . 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 Follow-up    PFT' done today. He has some sinus congestion and "allergies"- taking zyrtec daily. He has a non prod cough. Breathing is unchanged since the last visit.    walking x 20 min s stopping slow pace Cough  attributes to pnds / better on zyrtec  Off gerd rx on maint basis x 3 weeks > no change  Cough or sob  rec GERD dietThe key is to stop smoking completely before smoking completely stops you!       08/06/2017  f/u ov/Wert re:  PF favor nsip  Chief Complaint  Patient presents with  . Follow-up    6 month follow up. Denies any current breathing problems. States he has been doing well since his last visit. Had a 80mw this morning.   Dyspnea:  Not limited by breathing from desired activities   Cough: assoc with drainage better with zyrtec Sleeps flat fine  No obvious day to day or daytime variability or assoc excess/ purulent sputum or mucus plugs or hemoptysis or cp or chest tightness, subjective wheeze or overt sinus or hb symptoms. No unusual exposure hx or h/o childhood pna/ asthma or knowledge of premature birth.  Sleeping  Ok flat  without nocturnal  or early am exacerbation  of respiratory  c/o's or need for noct saba. Also denies any obvious fluctuation of symptoms with weather or environmental changes or other aggravating or alleviating factors except as outlined above   Current Allergies, Complete Past Medical History, Past Surgical History, Family History, and Social History were reviewed in [employer]  electronic medical record.  ROS  The following are not active complaints unless bolded Hoarseness, sore throat, dysphagia, dental problems, itching, sneezing,  nasal congestion or discharge of excess mucus or purulent secretions, ear ache,   fever, chills, sweats, unintended wt loss or wt gain, classically pleuritic or exertional cp,  orthopnea pnd or arm/hand swelling  or leg swelling, presyncope, palpitations, abdominal pain, anorexia, nausea, vomiting, diarrhea  or change in bowel habits or change in bladder habits, change in stools or change in urine, dysuria, hematuria,  rash, arthralgias, visual complaints, headache, numbness, weakness or ataxia or problems with walking or  coordination,  change in mood or  memory.        Current Meds  Medication Sig  . allopurinol (ZYLOPRIM) 300 MG tablet Take 300 mg by mouth daily.  Marland Kitchen aspirin EC 325 MG tablet Take 325 mg by mouth daily.  . bicalutamide (CASODEX) 50 MG tablet Take 50 mg by mouth at bedtime.   . Coenzyme Q10 (CO Q10 PO) Take 300 mg by mouth daily.  . colchicine 0.6 MG tablet Take 1 tablet (0.6 mg total) by mouth daily. (Patient taking differently: Take 0.6 mg by mouth daily as needed. For gout)  . docusate sodium (COLACE) 100 MG capsule Take 100 mg by mouth 3 (three) times daily.  . feeding supplement, ENSURE ENLIVE, (ENSURE ENLIVE) LIQD Take 237 mLs by mouth 2 (two) times daily between meals.  . fenofibrate 160 MG tablet Take 160 mg by mouth every evening.  . finasteride (PROSCAR) 5 MG tablet Take 5 mg by mouth at bedtime.   . lidocaine (LIDODERM) 5 % Place 1 patch onto the skin daily. Remove & Discard patch within 12 hours or as directed by MD  . metFORMIN (GLUCOPHAGE) 1000 MG tablet Take 0.5 tablets (500 mg total) by mouth 2 (two) times daily with a meal.  . methocarbamol (ROBAXIN) 500 MG tablet 500 mg by mouth 3 times a day when necessary pain (Patient taking differently: 4 (four) times daily as needed. 500 mg by mouth 3 times a day when necessary pain)  . metoprolol (LOPRESSOR) 50 MG tablet Take 25 mg by mouth 2 (two) times daily.   Marland Kitchen olmesartan (BENICAR) 20 MG tablet Take 1 tablet by mouth daily.  Marland Kitchen oxyCODONE-acetaminophen (PERCOCET) 10-325 MG tablet Take 1 tablet by mouth every 4 (four) hours as needed for pain.  . pantoprazole (PROTONIX) 40 MG tablet Take 40 mg by mouth as needed (gerd, acid reflux).   . polyethylene glycol (MIRALAX / GLYCOLAX) packet Take 17 g by mouth daily. Reported on 08/02/2015  . pravastatin (PRAVACHOL) 20 MG tablet Take 20 mg by mouth every evening.  . ranitidine (ZANTAC) 75 MG tablet Take 150 mg by mouth at bedtime as needed for heartburn.                               Objective:   Physical Exam   Pleasant amb wm nad    08/06/2017       191  02/05/2017      193  08/06/2016      191   02/06/16 189 lb 11.2 oz (86 kg)  08/02/15 196 lb (88.9 kg)  07/28/15 197 lb 8 oz (89.6 kg)    Vital signs reviewed - Note on arrival 02 sats  99% on RA      HEENT: nl   turbinates bilaterally, and oropharynx. Nl external ear canals without cough  reflex - full dentures    NECK :  without JVD/Nodes/TM/ nl carotid upstrokes bilaterally   LUNGS: no acc muscle use,  Nl contour chest which is clear to A and P bilaterally without cough on insp or exp maneuvers   CV:  RRR  no s3 or murmur or increase in P2, and no edema   ABD:  soft and nontender with nl inspiratory excursion in the supine position. No bruits or organomegaly appreciated, bowel sounds nl  MS:  Nl gait/ ext warm without deformities, calf tenderness, cyanosis or clubbing No obvious joint restrictions   SKIN: warm and dry without lesions    NEURO:  alert, approp, nl sensorium with  no motor or cerebellar deficits apparent.                Assessment & Plan:

## 2017-08-06 NOTE — Assessment & Plan Note (Signed)
CT chest at [facility] 01/14/17 mpns largest 8 mm rml with interval growth since 01/11/16 > repeat CT ordered 08/06/2017

## 2017-08-06 NOTE — Progress Notes (Signed)
  SIX MIN WALK 08/06/2017 08/06/2016 02/06/2016  Medications patient took his Metformin, Aspirin, and Metoprolol at 7:30 AM  - -  Supplimental Oxygen during Test? (L/min) No No No  Laps 8 - -  Partial Lap (in Meters) 0 - -  Baseline BP (sitting) 132/80 - -  Baseline Heartrate 51 - -  Baseline Dyspnea (Borg Scale) 0 - -  Baseline Fatigue (Borg Scale) 0 - -  Baseline SPO2 99 - -  BP (sitting) 138/78 - -  Heartrate 67 - -  Dyspnea (Borg Scale) 1 - -  Fatigue (Borg Scale) 1 - -  SPO2 99 - -  BP (sitting) 136/78 - -  Heartrate 55 - -  SPO2 100 - -  Stopped or Paused before Six Minutes No - -  Distance Completed 384 - -  Tech Comments: patient completed 384 laps in 6 minutes with no pausing or stopping. patient had no symptoms at the end of the test.  Patient was able to complete all 3 laps at a fast pace. Denied any signs of SOB or chest pains.  Pt. walked at a rapid steady pace to the best of his ability because he did state he was having back pain, he did not take any breaks

## 2017-08-15 ENCOUNTER — Ambulatory Visit (INDEPENDENT_AMBULATORY_CARE_PROVIDER_SITE_OTHER)
Admission: RE | Admit: 2017-08-15 | Discharge: 2017-08-15 | Disposition: A | Discharge status: Home / Self Care | Payer: Medicare Other | Source: Ambulatory Visit | Attending: Internal Medicine | Admitting: Internal Medicine

## 2017-08-15 DIAGNOSIS — J8410 Pulmonary fibrosis, unspecified: Secondary | ICD-10-CM

## 2017-08-16 ENCOUNTER — Telehealth: Payer: Self-pay | Admitting: Internal Medicine

## 2017-08-16 NOTE — Progress Notes (Signed)
LMTCB

## 2017-08-16 NOTE — Telephone Encounter (Signed)
.  Advised pt of results. Pt understood and nothing further is needed.    Notes recorded by Tanda Rockers, MD on 08/16/2017 at 1:22 PM EDT Call patient : Study is minimally changed and radiology recs repeat in 6 m which corresponds to f/u with me anyway so we'll arrange for this then

## 2017-08-21 ENCOUNTER — Ambulatory Visit: Payer: Medicare Other | Admitting: Internal Medicine

## 2017-08-21 ENCOUNTER — Encounter: Payer: Self-pay | Admitting: Internal Medicine

## 2017-08-21 VITALS — BP 138/78 | HR 57 | Ht 70.0 in | Wt 192.8 lb

## 2017-08-21 DIAGNOSIS — Z9889 Other specified postprocedural states: Secondary | ICD-10-CM

## 2017-08-21 DIAGNOSIS — Z8679 Personal history of other diseases of the circulatory system: Secondary | ICD-10-CM

## 2017-08-21 DIAGNOSIS — E782 Mixed hyperlipidemia: Secondary | ICD-10-CM

## 2017-08-21 DIAGNOSIS — I10 Essential (primary) hypertension: Secondary | ICD-10-CM

## 2017-08-21 DIAGNOSIS — I2510 Atherosclerotic heart disease of native coronary artery without angina pectoris: Secondary | ICD-10-CM | POA: Diagnosis not present

## 2017-08-21 NOTE — Patient Instructions (Addendum)
Medication Instructions:   Dr. Debara Pickett recommends Repatha 140mg  (PCSK9). This is an injectable cholesterol medication. This medication will need prior approval with your insurance company, which we will work on. If the medication is not approved initially, we may need to do an appeal with your insurance. We will keep you updated on this process. This medication can be provided at some local pharmacies or be shipped to your from a specialty pharmacy.    Labwork:  FASTING lab work after you have been on [place] for 3 months  Testing/Procedures:  Lower extremity arterial dopplers & Aorta/Iliac/IVC dopplers/ABI - done in Dr. Lysbeth Penner office  Follow-Up:  THREE MONTHS with Dr. Debara Pickett for lipid clinic follow up  If you need a refill on your cardiac medications before your next appointment, please call your pharmacy.  Any Other Special Instructions Will Be Listed Below (If Applicable).
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
## 2017-08-28 ENCOUNTER — Telehealth: Payer: Self-pay | Admitting: Internal Medicine

## 2017-08-28 NOTE — Telephone Encounter (Signed)
Prior auth for Repatha submitted via [email or web address] KeyGerhard Munch - PA Case ID: [id number]

## 2017-08-29 NOTE — Telephone Encounter (Signed)
Received denial for Repatha d/t incorrect answer on PA via covermymeds about provider attestation that info is true & accurate. Was unable to resubmit PA on covermymeds or do an appeal over the phone. Spoke with rep [phone]) @ [employer] and was advised to submit documentation that shoes diagnosis, tried and failed meds, medical necessity & copy of denial letter to [phone] and mark as urgent.

## 2017-08-29 NOTE — Telephone Encounter (Signed)
Appeal info faxed

## 2017-09-04 ENCOUNTER — Other Ambulatory Visit: Payer: Self-pay | Admitting: Internal Medicine

## 2017-09-04 DIAGNOSIS — I714 Abdominal aortic aneurysm, without rupture, unspecified: Secondary | ICD-10-CM

## 2017-09-04 DIAGNOSIS — I739 Peripheral vascular disease, unspecified: Secondary | ICD-10-CM

## 2017-09-04 DIAGNOSIS — Z9889 Other specified postprocedural states: Secondary | ICD-10-CM

## 2017-09-04 NOTE — Telephone Encounter (Signed)
LM for Cody Moss with [facility] to return call concerning Repatha appeals (phone: [phone] ext: [phone])

## 2017-09-05 ENCOUNTER — Ambulatory Visit (HOSPITAL_COMMUNITY)
Admission: RE | Admit: 2017-09-05 | Discharge: 2017-09-05 | Disposition: A | Discharge status: Home / Self Care | Payer: Medicare Other | Source: Ambulatory Visit | Attending: Cardiology | Admitting: Cardiology

## 2017-09-05 ENCOUNTER — Telehealth: Payer: Self-pay | Admitting: Internal Medicine

## 2017-09-05 ENCOUNTER — Other Ambulatory Visit: Payer: Self-pay

## 2017-09-05 DIAGNOSIS — E785 Hyperlipidemia, unspecified: Secondary | ICD-10-CM | POA: Insufficient documentation

## 2017-09-05 DIAGNOSIS — I714 Abdominal aortic aneurysm, without rupture, unspecified: Secondary | ICD-10-CM

## 2017-09-05 DIAGNOSIS — Z95828 Presence of other vascular implants and grafts: Secondary | ICD-10-CM | POA: Diagnosis not present

## 2017-09-05 DIAGNOSIS — Z9889 Other specified postprocedural states: Secondary | ICD-10-CM | POA: Diagnosis not present

## 2017-09-05 DIAGNOSIS — I10 Essential (primary) hypertension: Secondary | ICD-10-CM | POA: Insufficient documentation

## 2017-09-05 DIAGNOSIS — Z8679 Personal history of other diseases of the circulatory system: Secondary | ICD-10-CM

## 2017-09-05 DIAGNOSIS — I739 Peripheral vascular disease, unspecified: Secondary | ICD-10-CM | POA: Diagnosis present

## 2017-09-05 DIAGNOSIS — I2510 Atherosclerotic heart disease of native coronary artery without angina pectoris: Secondary | ICD-10-CM | POA: Insufficient documentation

## 2017-09-05 DIAGNOSIS — F17200 Nicotine dependence, unspecified, uncomplicated: Secondary | ICD-10-CM | POA: Insufficient documentation

## 2017-09-05 NOTE — Telephone Encounter (Signed)
Cody Moss, I believe this one is yours.Marland KitchenMarland Kitchen

## 2017-09-05 NOTE — Telephone Encounter (Signed)
New Message   Patients wife is calling on behalf of spouse. She indicates that the Repatha has been approved yet they need to know where to go from here. Please call to discuss.

## 2017-09-06 MED ORDER — EVOLOCUMAB 140 MG/ML ~~LOC~~ SOAJ: 1.0000 | SUBCUTANEOUS | 5 refills | Status: DC

## 2017-09-06 NOTE — Telephone Encounter (Signed)
Spoke with patient's wife. She states [facility] will not fill Rep[facility] and she cannot recall name of specialty pharmacy. Advised I will call and find out which pharmacy. She will call back concerning costs, patient assistance.   Spoke with Eb[facility] Hail [facility] - the [facility]. They will either ship Rx to patient or to local [facility].   Also notified patient's wife of recent vascular test results.

## 2017-09-06 NOTE — Telephone Encounter (Signed)
Per wife phone call, Repatha has been approved. Waiting on confirmation fax from insurance

## 2017-09-06 NOTE — Telephone Encounter (Signed)
LMTCB to discuss Rx. In VM, stated [facility] and if his insurance specifies that a specialty pharmacy has to be used, [facility] should notify me of this. Also asked that they contact me with the copay amount and if they wish to pursue patient assistance.

## 2017-09-06 NOTE — Telephone Encounter (Signed)
Follow Up:    Returning your call from this morning.

## 2017-09-13 IMAGING — CT CT CHEST HIGH RESOLUTION W/O CM
2 of 6 series · 14 of 36 positions shown, 17 images · non-contrast
Comparison: No priors.

CLINICAL DATA: 77-year-old male with history of shortness of
breath. Evaluate for postinflammatory fibrosis.

EXAM:
CT CHEST WITHOUT CONTRAST
TECHNIQUE: Multidetector CT imaging of the chest was performed following the
standard protocol without intravenous contrast. High resolution
imaging of the lungs, as well as inspiratory and expiratory imaging,
was performed.

[Series 2: high resolution · axial · 0.71mm/px · z∈[-349,-35]mm · 11 of 177 slices shown, 14 images]
[im 10/177  mediastinal]
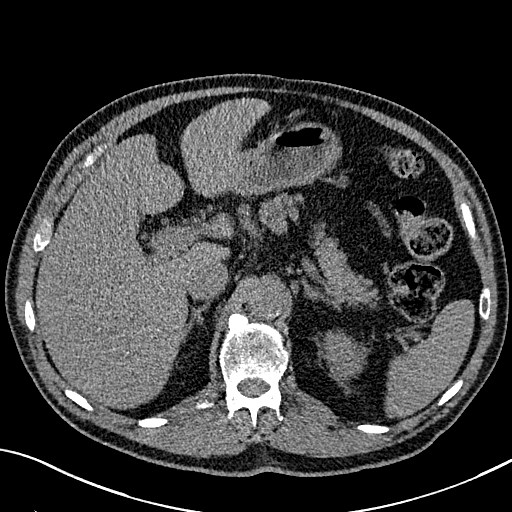
[im 10/177  lung]
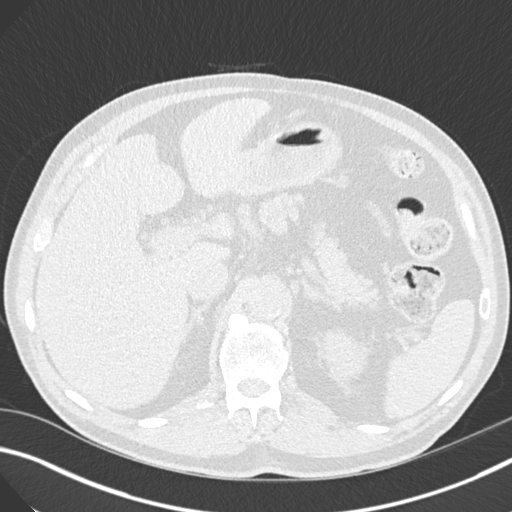
[im 28/177  lung]
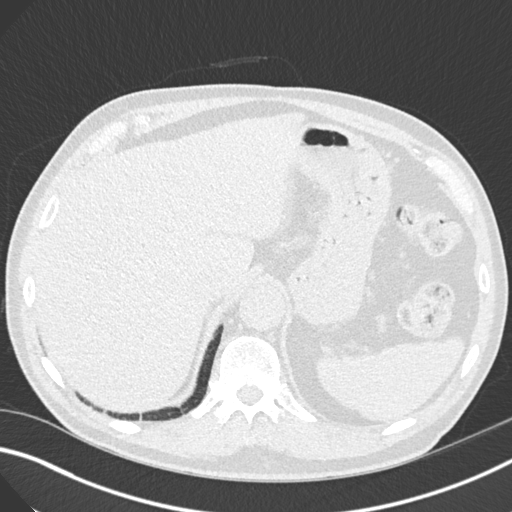
[im 47/177  lung]
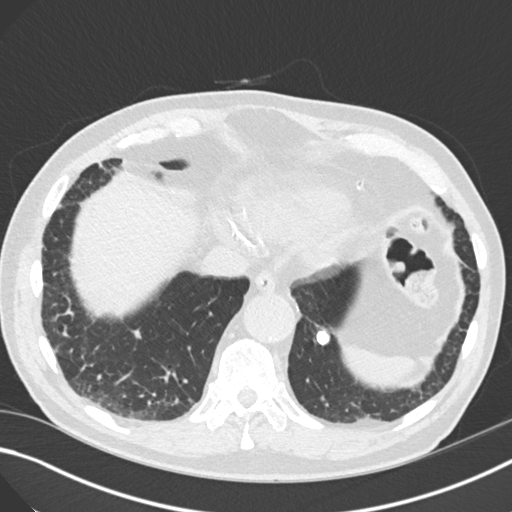
[im 56/177  lung]
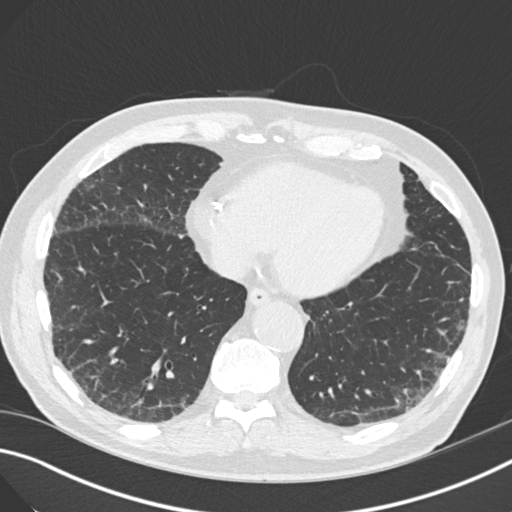
[im 75/177  mediastinal]
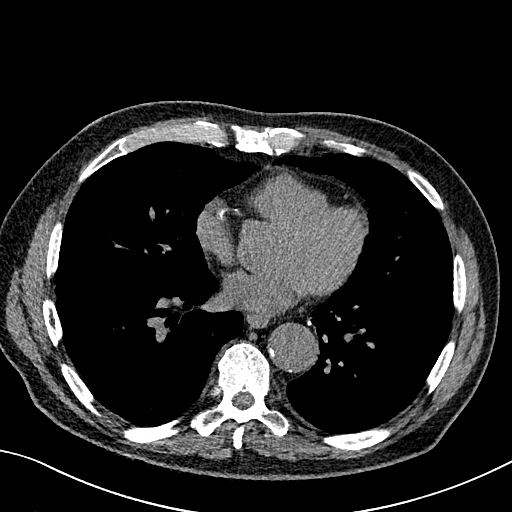
[im 75/177  lung]
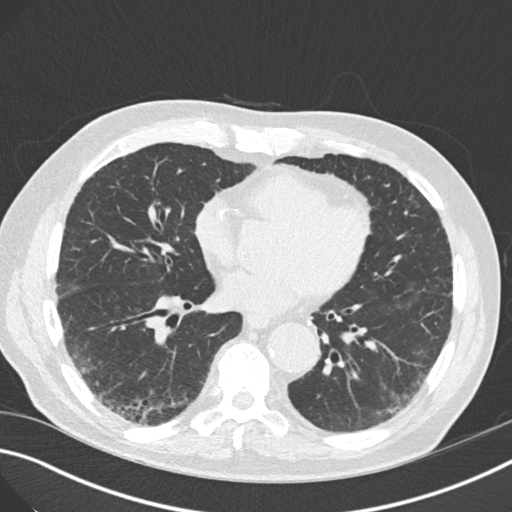
[im 93/177  lung]
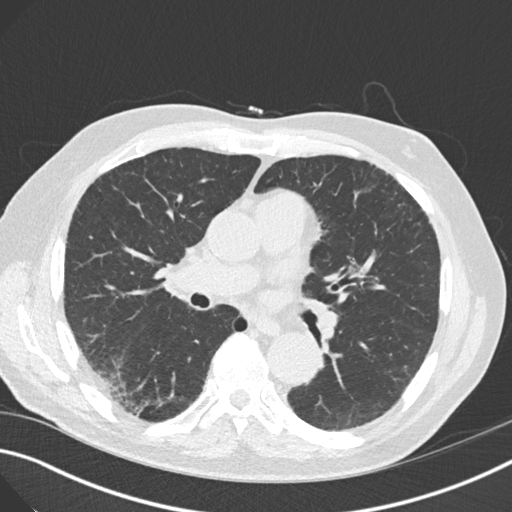
[im 102/177  lung]
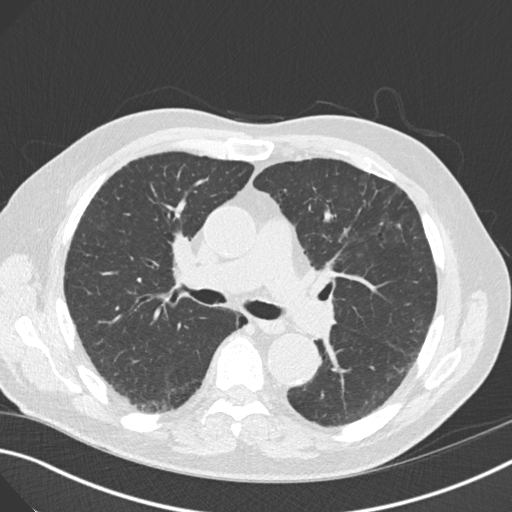
[im 121/177  lung]
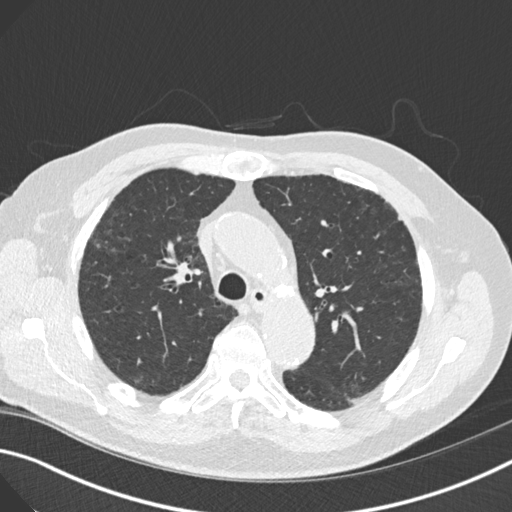
[im 130/177  mediastinal]
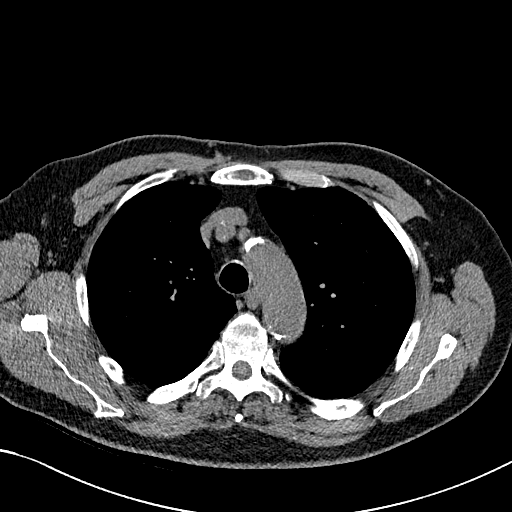
[im 130/177  lung]
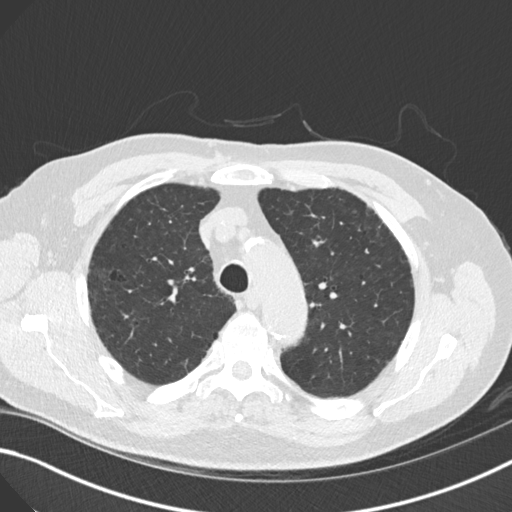
[im 149/177  lung]
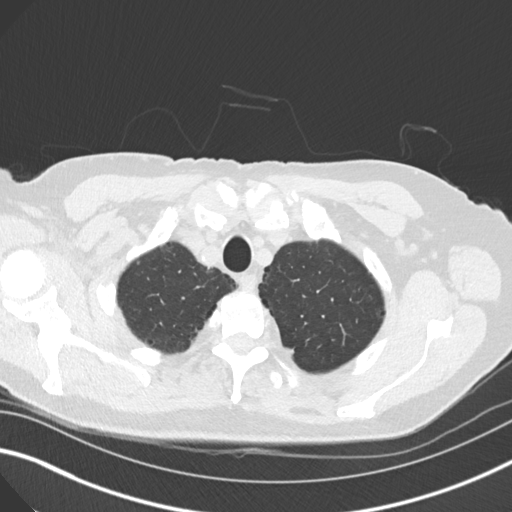
[im 167/177  lung]
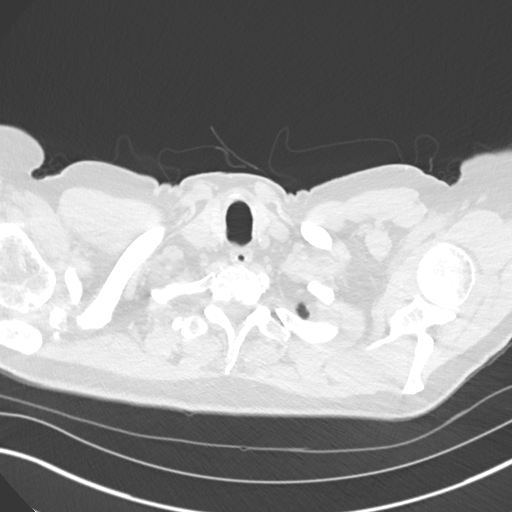

[Series 8: coronal · coronal · 0.69mm/px · 3 of 126 slices shown]
[im 26/126  lung]
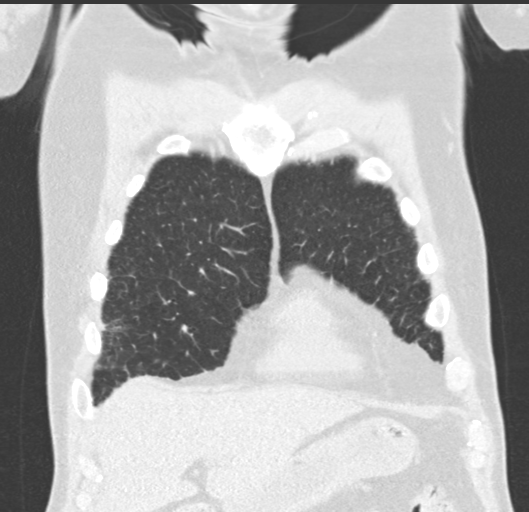
[im 51/126  lung]
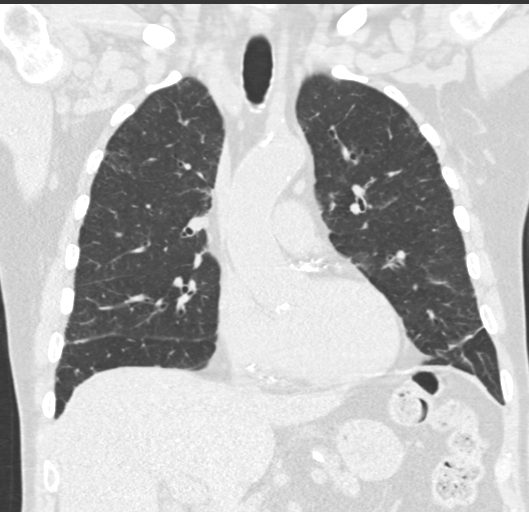
[im 76/126  lung]
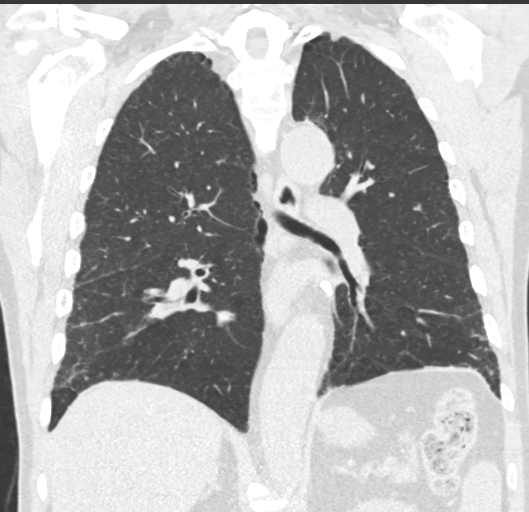

[14 of 36 positions shown; findings below may reference images not displayed]

FINDINGS: Cardiovascular: Heart size is normal. There is no significant
pericardial fluid, thickening or pericardial calcification. There is
aortic atherosclerosis, as well as atherosclerosis of the great
vessels of the mediastinum and the coronary arteries, including
calcified atherosclerotic plaque in the left main, left anterior
descending, left circumflex and right coronary arteries. There is no
significant pericardial fluid, thickening or pericardial
calcification. Calcifications of the aortic valve.

Mediastinum/Nodes: No pathologically enlarged mediastinal or hilar
lymph nodes. Please note that accurate exclusion of hilar adenopathy
is limited on noncontrast CT scans. Esophagus is unremarkable in
appearance. No axillary lymphadenopathy.

Lungs/Pleura: High-resolution images demonstrate patchy areas of
peripheral predominant ground-glass attenuation and septal
thickening with some associated mild cylindrical traction
bronchiectasis and peripheral bronchiolectasis. These findings do
have a mild craniocaudal gradient. No definite honeycombing
identified. In the lung bases, these findings appear progressive
compared to prior CTs of the abdomen and pelvis from 3125 and 0841.
Inspiratory and expiratory imaging demonstrates scattered areas of
mild air trapping. No acute consolidative airspace disease. No
pleural effusions. Several densely calcified pulmonary nodules are
noted in the left lower lobe, compatible with calcified granulomas.
No other larger more suspicious appearing pulmonary nodules or
masses are noted. Mild diffuse bronchial wall thickening with mild
centrilobular and paraseptal emphysema, most apparent the lung
apices.

Upper Abdomen: Aortic atherosclerosis.

Musculoskeletal: There are no aggressive appearing lytic or blastic
lesions noted in the visualized portions of the skeleton.
IMPRESSION: 1. The appearance of the chest is compatible with interstitial lung
disease. The findings are overall favored to reflect nonspecific
interstitial pneumonia (NSIP), however, given the craniocaudal
gradient, the possibility of early usual interstitial pneumonia
(UIP) is not entirely excluded. Accordingly, repeat high-resolution
chest CT is recommended in 12 months to assess for temporal changes
in the appearance of the lung parenchyma.
2. Mild diffuse bronchial wall thickening with mild centrilobular
and paraseptal emphysema, most apparent the lung apices; imaging
findings suggestive of concurrent COPD.
3. Aortic atherosclerosis, in addition to left main and 3 vessel
coronary artery disease. Assessment for potential risk factor
modification, dietary therapy or pharmacologic therapy may be
warranted, if clinically indicated.
4. There are calcifications of the aortic valve. Echocardiographic
correlation for evaluation of potential valvular dysfunction may be
warranted if clinically indicated.

## 2017-09-18 ENCOUNTER — Telehealth: Payer: Self-pay | Admitting: Internal Medicine

## 2017-09-18 NOTE — Telephone Encounter (Signed)
LM for patient to return call to discuss Repatha - has he received it, any questions/issues, does he need patient assistance?

## 2017-09-24 NOTE — Telephone Encounter (Signed)
Received mailed letter from [place] that [place] is approved from Aug 28, 2017 - April 22, 2018.  Authorization # [id number]

## 2017-09-27 NOTE — Telephone Encounter (Signed)
Attempted to call patient on home # - phone rang, no answer

## 2017-10-16 ENCOUNTER — Telehealth: Payer: Self-pay | Admitting: Internal Medicine

## 2017-10-16 NOTE — Telephone Encounter (Signed)
LM for patient with appointment reminder and asked for call back to discuss if patient received Repatha and how long he has been on it.

## 2017-10-17 ENCOUNTER — Telehealth: Payer: Self-pay | Admitting: Internal Medicine

## 2017-10-17 NOTE — Telephone Encounter (Signed)
We can probably push back his appointment 2-3 months on a lipid clinic day and recheck lipid profile just prior to the visit.  Thanks.  Dr. Lemmie Evens

## 2017-10-17 NOTE — Telephone Encounter (Signed)
Pt has not started Repatha has been approved and will start Pt has an appt on 7/3 with Dr Debara Pickett and is wanting to know if needs to keep this appt .Will forward to Dr Debara Pickett for review .Adonis Housekeeper

## 2017-10-17 NOTE — Telephone Encounter (Signed)
New message:       Pt is calling and states he is calling to give RN some information before his appt on 7/3

## 2017-10-17 NOTE — Telephone Encounter (Signed)
LMTCB to discuss rescheduling appointment

## 2017-10-22 NOTE — Telephone Encounter (Signed)
Patient cancelled 7/3 appointment. This will need to be rescheduled.

## 2017-10-22 NOTE — Telephone Encounter (Signed)
LMTCB

## 2017-10-23 ENCOUNTER — Ambulatory Visit: Payer: Medicare Other | Admitting: Internal Medicine

## 2017-11-08 NOTE — Telephone Encounter (Signed)
Patient has MD OV for lipid follow up in October

## 2017-12-19 ENCOUNTER — Encounter: Payer: Self-pay | Admitting: Internal Medicine

## 2017-12-27 ENCOUNTER — Other Ambulatory Visit: Payer: Self-pay | Admitting: Internal Medicine

## 2017-12-27 DIAGNOSIS — J8410 Pulmonary fibrosis, unspecified: Secondary | ICD-10-CM

## 2018-01-21 ENCOUNTER — Other Ambulatory Visit: Payer: Self-pay | Admitting: Urology

## 2018-01-21 DIAGNOSIS — C61 Malignant neoplasm of prostate: Secondary | ICD-10-CM

## 2018-01-23 ENCOUNTER — Ambulatory Visit: Payer: Medicare Other | Admitting: Internal Medicine

## 2018-02-06 ENCOUNTER — Ambulatory Visit: Payer: Medicare Other | Admitting: Internal Medicine

## 2018-02-10 ENCOUNTER — Ambulatory Visit: Payer: Medicare Other | Admitting: Internal Medicine

## 2018-02-13 ENCOUNTER — Ambulatory Visit: Payer: Medicare Other | Admitting: Internal Medicine

## 2018-02-13 ENCOUNTER — Encounter: Payer: Self-pay | Admitting: Internal Medicine

## 2018-02-13 VITALS — BP 174/78 | HR 61 | Ht 70.0 in | Wt 191.8 lb

## 2018-02-13 DIAGNOSIS — E785 Hyperlipidemia, unspecified: Secondary | ICD-10-CM | POA: Diagnosis not present

## 2018-02-13 DIAGNOSIS — I2510 Atherosclerotic heart disease of native coronary artery without angina pectoris: Secondary | ICD-10-CM

## 2018-02-13 DIAGNOSIS — I739 Peripheral vascular disease, unspecified: Secondary | ICD-10-CM | POA: Diagnosis not present
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
## 2018-02-13 NOTE — Patient Instructions (Addendum)
Medication Instructions:  DECREASE aspirin to 81mg  daily START repatha  ** please complete and return patient assistance application ASAP - prior authorization with insurance expires 04/22/18  If you need a refill on your cardiac medications before your next appointment, please call your pharmacy.   Lab work: FASTING lab work late December 2019 to recheck cholesterol (in order to get your Repatha re-approved with insurance) If you have labs (blood work) drawn today and your tests are completely normal, you will receive your results only by: Marland Kitchen MyChart Message (if you have MyChart) OR . A paper copy in the mail If you have any lab test that is abnormal or we need to change your treatment, we will call you to review the results.  Testing/Procedures: NONE  Follow-Up: At [facility], you and your health needs are our priority.  As part of our continuing mission to provide you with exceptional heart care, we have created designated Provider Care Teams.  These Care Teams include your primary Cardiologist (physician) and Advanced Practice Providers (APPs -  Physician Assistants and Nurse Practitioners) who all work together to provide you with the care you need, when you need it.  You will need a LIPID CLINIC follow up visit with Dr. Debara Pickett in 3-4 months

## 2018-02-14 ENCOUNTER — Ambulatory Visit (INDEPENDENT_AMBULATORY_CARE_PROVIDER_SITE_OTHER)
Admission: RE | Admit: 2018-02-14 | Discharge: 2018-02-14 | Disposition: A | Discharge status: Home / Self Care | Payer: Medicare Other | Source: Ambulatory Visit | Attending: Internal Medicine | Admitting: Internal Medicine

## 2018-02-14 DIAGNOSIS — J8410 Pulmonary fibrosis, unspecified: Secondary | ICD-10-CM

## 2018-02-17 ENCOUNTER — Telehealth: Payer: Self-pay | Admitting: Internal Medicine

## 2018-02-17 ENCOUNTER — Ambulatory Visit: Payer: Medicare Other | Admitting: Internal Medicine

## 2018-02-17 NOTE — Telephone Encounter (Signed)
Contacted patient's wife about patient assistance application. Answered questions concerning various parts of the application. She will fax to my attention.

## 2018-02-17 NOTE — Telephone Encounter (Signed)
Follow up   Please contact patient's wife at [phone].

## 2018-02-17 NOTE — Telephone Encounter (Signed)
Patient assistance application received. Faxed to [employer] @ [phone]

## 2018-02-17 NOTE — Telephone Encounter (Signed)
New message    Patient wife wants to speak to you before she faxes the application back to you

## 2018-02-17 NOTE — Telephone Encounter (Signed)
New message   Patient's wife is returning your call.

## 2018-02-17 NOTE — Telephone Encounter (Signed)
LMTCB ** this is concerning [employer] scientist for [employer]

## 2018-02-19 NOTE — Telephone Encounter (Signed)
Faxed copy of prior authorization approval to [employer] per their request

## 2018-02-24 ENCOUNTER — Ambulatory Visit (HOSPITAL_COMMUNITY)
Admission: RE | Admit: 2018-02-24 | Discharge: 2018-02-24 | Disposition: A | Discharge status: Home / Self Care | Payer: Medicare Other | Source: Ambulatory Visit | Attending: Urology | Admitting: Urology

## 2018-02-24 ENCOUNTER — Encounter (HOSPITAL_COMMUNITY)
Admission: RE | Admit: 2018-02-24 | Discharge: 2018-02-24 | Disposition: A | Discharge status: Home / Self Care | Payer: Medicare Other | Source: Ambulatory Visit | Attending: Urology | Admitting: Urology

## 2018-02-24 DIAGNOSIS — C61 Malignant neoplasm of prostate: Secondary | ICD-10-CM | POA: Diagnosis not present

## 2018-02-24 MED ORDER — TECHNETIUM TC 99M MEDRONATE IV KIT
20.0000 | PACK | Freq: Once | INTRAVENOUS | Status: AC | PRN
  Administered 2018-02-24: 22 via INTRAVENOUS

## 2018-03-07 NOTE — Telephone Encounter (Signed)
Called [employer] to follow up on Safety Net Application for Repatha. They are waiting on copy of PA letter for med approval from insurance. Explained this was faxed on 02/19/18 but they cannot locate. Re-faxed copy of PA approval letter and asked his application be expedited.

## 2018-03-10 NOTE — Telephone Encounter (Signed)
Called [employer] to f/up on patient assistance application. They were requesting a PA approval date for 2020. Explained that there is not one, patient is applying for patient assistance for remainder of 2019. They will note this and process request.

## 2018-03-11 ENCOUNTER — Telehealth: Payer: Self-pay | Admitting: Internal Medicine

## 2018-03-11 NOTE — Telephone Encounter (Signed)
New Message   Patients wife [name] is calling in reference to the repatha. She is wanting to know has the patient assistance program been contacted and if so what is the outcome.  Please call

## 2018-03-11 NOTE — Telephone Encounter (Signed)
Spoke with pt's wife and informed Dr. Lysbeth Penner nurse is not in the office today, but will route message for her to contact them once she return. Wife verbalized understanding.

## 2018-03-12 NOTE — Telephone Encounter (Signed)
Per call placed to [employer], patient has been approved from 03/11/18 - 04/23/19

## 2018-03-12 NOTE — Telephone Encounter (Signed)
Mr. Loewen is enquiring about Repatha. FYI.

## 2018-03-12 NOTE — Telephone Encounter (Signed)
Spoke with wife and explained that per [employer] (11/18) they had his application on hold as they were thinking it would need to be processed for 2020, which is incorrect. Advised wife will follow up on this today and call her back.

## 2018-03-12 NOTE — Telephone Encounter (Signed)
Spoke with [employer]. Patient has been approved for Repatha patient assistance from 03/11/18 - 04/23/19.   Notified patient's wife and provided [employer] phone # for them to call to arrange shipment of med. Advised they will call [employer] for refills  Wife/patient are hesitant about the medication. Advised if he wishes, he can call our office with a day/time that would work for him to come in to do 1st injection with pharmacy staff.

## 2018-03-21 ENCOUNTER — Ambulatory Visit: Payer: Medicare Other | Admitting: Internal Medicine

## 2018-03-25 NOTE — Telephone Encounter (Signed)
Spoke with wife. Patient would like to come in for 1st Repatha injection tomorrow afternoon. Will notify pharmacy.

## 2018-03-25 NOTE — Telephone Encounter (Addendum)
Spoke with patient's wife. They are wanting to come in to administer first dose in the office. She was planning to ask husband when he would like to come in and phone became disconnected

## 2018-04-03 ENCOUNTER — Ambulatory Visit: Payer: Medicare Other | Admitting: Internal Medicine

## 2018-04-30 ENCOUNTER — Ambulatory Visit: Payer: Medicare Other | Admitting: Internal Medicine

## 2018-05-02 ENCOUNTER — Ambulatory Visit (INDEPENDENT_AMBULATORY_CARE_PROVIDER_SITE_OTHER): Payer: Medicare Other | Admitting: Internal Medicine

## 2018-05-02 ENCOUNTER — Encounter: Payer: Self-pay | Admitting: Internal Medicine

## 2018-05-02 VITALS — BP 140/68 | HR 60 | Ht 69.5 in | Wt 198.0 lb

## 2018-05-02 DIAGNOSIS — F17210 Nicotine dependence, cigarettes, uncomplicated: Secondary | ICD-10-CM

## 2018-05-02 DIAGNOSIS — J8410 Pulmonary fibrosis, unspecified: Secondary | ICD-10-CM | POA: Diagnosis not present

## 2018-05-02 LAB — PULMONARY FUNCTION TEST
DL/VA % pred: 74 %
DL/VA: 3.4 ml/min/mmHg/L
DLCO unc % pred: 61 %
DLCO unc: 19.61 ml/min/mmHg
FEF 25-75 Post: 3.14 L/sec
FEF 25-75 Pre: 2.77 L/sec
FEF2575-%Change-Post: 13 %
FEF2575-%Pred-Post: 144 %
FEF2575-%Pred-Pre: 127 %
FEV1-%Change-Post: 1 %
FEV1-%Pred-Post: 96 %
FEV1-%Pred-Pre: 95 %
FEV1-Post: 2.9 L
FEV1-Pre: 2.86 L
FEV1FVC-%Change-Post: 2 %
FEV1FVC-%Pred-Pre: 109 %
FEV6-%Change-Post: 0 %
FEV6-%Pred-Post: 91 %
FEV6-%Pred-Pre: 92 %
FEV6-Post: 3.56 L
FEV6-Pre: 3.58 L
FEV6FVC-%Change-Post: 0 %
FEV6FVC-%Pred-Post: 106 %
FEV6FVC-%Pred-Pre: 106 %
FVC-%Change-Post: 0 %
FVC-%Pred-Post: 86 %
FVC-%Pred-Pre: 86 %
FVC-Post: 3.58 L
FVC-Pre: 3.59 L
Post FEV1/FVC ratio: 81 %
Post FEV6/FVC ratio: 100 %
Pre FEV1/FVC ratio: 79 %
Pre FEV6/FVC Ratio: 100 %
RV % pred: 106 %
RV: 2.68 L
TLC % pred: 88 %
TLC: 6.13 L

## 2018-05-02 NOTE — Assessment & Plan Note (Signed)
Counseled re importance of smoking cessation but did not meet time criteria for separate billing     I had an extended discussion with the patient reviewing all relevant studies completed to date and  lasting 15 to 20 minutes of a 25 minute visit    Each maintenance medication was reviewed in detail including most importantly the difference between maintenance and prns and under what circumstances the prns are to be triggered using an action plan format that is not reflected in the computer generated alphabetically organized AVS.     Please see AVS for specific instructions unique to this visit that I personally wrote and verbalized to the the pt in detail and then reviewed with pt  by my nurse highlighting any  changes in therapy recommended at today's visit to their plan of care.

## 2018-05-02 NOTE — Assessment & Plan Note (Signed)
See CT abd 01/11/16 (incidental finding of pf)  - 02/06/2016  Walked RA x 3 laps @ 185 ft each stopped due to  End of study, fast pace, no sob or desat    - PFT's  02/20/16   FVC  3.94 (91%) s obst and s rx  prior to study with DLCO  60 % corrects to 67  % for alv volume   - 08/06/2016 Walked RA x 3 laps @ 185 ft each stopped due to  End of study, fast pace, no sob or desat    - HRCT 08/13/16 >>> The appearance of the chest is compatible with interstitial lung disease. 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 Findings are indeterminate for usual interstitial pneumonitis by ATS Fleischner criteria. - HRCT 02/14/2018  1. Fibrotic changes in the lungs appear stable compared to prior studies dating back to 08/14/2016, again classified as indeterminate for usual interstitial pneumonia (UIP) per ATS guidelines. 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 2. Mild diffuse bronchial wall thickening with mild centrilobular and paraseptal emphysema. 3. Aortic atherosclerosis, in addition to left main and 3 vessel coronary artery disease - PFT's  05/02/2018  FVC  3.59 (86%) s obst on no rx  prior to study with DLCO  61 % corrects to 74  % for alv volume     Despite active smoking, no evidence at all of dz progression or UIP here > f/u can be yearly but strongly advised to monitor sats while walking for "more points on the  curve"   Discussed in detail all the  indications, usual  risks and alternatives  relative to the benefits with patient who agrees to proceed with conservative f/u as outlined

## 2018-05-02 NOTE — Patient Instructions (Addendum)
Keep monitoring your 02 sats toward the end of your walk (not after you stop) and let me know if trending down  The key is to stop smoking completely before smoking completely stops you!  For smoking cessation classes call [phone]     Suggested e-cigs only as an optional  "one way bridge"  Off all tobacco products    Otherwise return in 12 months with pfts on return

## 2018-05-02 NOTE — Progress Notes (Signed)
Subjective:    Patient ID: Cody Moss, male    DOB: [birth year],    MRN: [id number]    Brief patient profile:  49 yowm active smoker with prostate ca s/p prostatectomy / RT 2004 with serial f/u by Dr Cody Moss who referred him to [facility] 02/06/2016 for eval of abn CT c/w PF in bases 01/11/16   No h/o chemo exposures,  ALI, macrodantin or Connective tissue dz or inflammatory bowel dz    History of Present Illness  02/06/2016 1st Cody Moss office visit/ Cody Moss   Chief Complaint  Patient presents with  . Advice Only    Referred by Dr. Mar Moss, Pt. states he had a bone density scan was told to follow up here, Pt. denies SOB, wheezing,chest pain or chest tightness, Pt. 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  Schedule a full set of PFTs at [facility] in the next 4 weeks  GERD diet     08/06/2016  f/u ov/Cody Moss re:  Active smoker / ? PF  Some cough due to pnds attributed to allergies/ rx zyrtec  Chief Complaint  Patient presents with  . 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 Follow-up    PFT' done today. He has some sinus congestion and "allergies"- taking zyrtec daily. He has a non prod cough. Breathing is unchanged since the last visit.    walking x 20 min s stopping slow pace Cough  attributes to pnds / better on zyrtec  Off gerd rx on maint basis x 3 weeks > no change  Cough or sob  rec GERD diet The key is to stop smoking completely before smoking completely stops you!       08/06/2017  f/u ov/Cody Moss re:  PF favor nsip  Chief Complaint  Patient presents with  . Follow-up    6 month follow up. Denies any current breathing problems. States he has been doing well since his last visit. Had a 18mw this morning.   Dyspnea:  Not limited by breathing from desired activities   Cough: assoc with drainage better with zyrtec Sleeps flat fine rec Please see patient coordinator before you leave today  to schedule HRCT of Chest  And I will call you with results The key is to stop smoking completely before smoking completely stops you!      05/02/2018  f/u ov/Cody Moss re:  PF/ active smoker  Chief Complaint  Patient presents with  . Moss Fibrosis    Breathing is doing well. Denies chest tightness, wheezing, SOB or coughing. PFT was completed today.  Dyspnea:  Walking 23min mile around track never checks sats  Cough: none Sleeping: flat SABA use: none  02: none    No obvious day to day or daytime variability  or assoc excess/ purulent sputum or mucus plugs or hemoptysis or cp or chest tightness, subjective wheeze or overt sinus or hb symptoms.   Sleeps as above  without nocturnal  or early am exacerbation  of respiratory  c/o's or need for noct saba. Also denies any obvious fluctuation of symptoms with weather or environmental changes or other aggravating or alleviating factors except as outlined above   No unusual exposure hx or h/o childhood pna/ asthma or knowledge of premature birth.  Current Allergies, Complete Past Medical History, Past Surgical History, Family History, and Social History were reviewed in [employer] record.  ROS  The following are not active complaints unless bolded Hoarseness, sore throat, dysphagia, dental problems,  itching, sneezing,  nasal congestion or discharge of excess mucus or purulent secretions, ear ache,   fever, chills, sweats, unintended wt loss or wt gain, classically pleuritic or exertional cp,  orthopnea pnd or arm/hand swelling  or leg swelling, presyncope, palpitations, abdominal pain, anorexia, nausea, vomiting, diarrhea  or change in bowel habits or change in bladder habits, change in stools or change in urine, dysuria, hematuria,  rash, arthralgias, visual complaints, headache, numbness, weakness or ataxia or problems with walking or coordination,  change in mood or  memory.        Current Meds  Medication Sig  . allopurinol (ZYLOPRIM) 300 MG tablet Take 300 mg by mouth daily.  Marland Kitchen aspirin 81 MG EC tablet Take 81 mg by mouth daily. Swallow whole.  . bicalutamide (CASODEX) 50 MG tablet Take 50 mg by mouth at bedtime.   . Coenzyme Q10 (CO Q10 PO) Take 300 mg by mouth daily.  . colchicine 0.6 MG tablet Take 1 tablet (0.6 mg total) by mouth daily. (Patient taking differently: Take 0.6 mg by mouth daily as needed. For gout)  . Evolocumab (REPATHA SURECLICK) 195 MG/ML SOAJ Inject 1 Dose into the skin every 14 (fourteen) days.  . feeding supplement, ENSURE ENLIVE, (ENSURE ENLIVE) LIQD Take 237 mLs by mouth 2 (two) times daily between meals.  . fenofibrate 160 MG tablet Take 160 mg by mouth every evening.  . finasteride (PROSCAR) 5 MG tablet Take 5 mg by mouth at bedtime.   . metFORMIN (GLUCOPHAGE) 1000 MG tablet Take 0.5 tablets (500 mg total) by mouth 2 (two) times daily with a meal.  . methocarbamol (ROBAXIN) 500 MG tablet 500 mg by mouth 3 times a day when necessary pain (Patient taking differently: 4 (four) times daily as needed. 500 mg by mouth 3 times a day when necessary pain)  . metoprolol (LOPRESSOR) 50 MG tablet Take 25 mg by mouth 2 (two) times daily.   Marland Kitchen olmesartan (BENICAR) 20 MG tablet Take 1 tablet by mouth daily.  Marland Kitchen oxyCODONE-acetaminophen (PERCOCET) 10-325 MG tablet Take 1 tablet by  mouth every 4 (four) hours as needed for pain.  . polyethylene glycol (MIRALAX / GLYCOLAX) packet Take 17 g by mouth daily. Reported on 08/02/2015  . pravastatin (PRAVACHOL) 20 MG tablet Take 20 mg by mouth every evening.  . [DISCONTINUED] pantoprazole (PROTONIX) 40 MG tablet Take 40 mg by mouth as needed (gerd, acid reflux).                                Objective:  Physical Exam   Pleasant amb wm nad     05/02/2018      198  08/06/2017       191  02/05/2017      193  08/06/2016      191   02/06/16 189 lb 11.2 oz (86 kg)  08/02/15 196 lb (88.9 kg)  07/28/15 197 lb 8 oz (89.6 kg)     Vital signs reviewed - Note on arrival 02 sats  96% on RA        HEENT: Full dentures/ nl  turbinates bilaterally, and oropharynx. Nl external ear canals without cough reflex   NECK :  without JVD/Nodes/TM/ nl carotid upstrokes bilaterally   LUNGS: no acc muscle use,  Nl contour chest which is clear to A and P bilaterally without cough on insp or exp maneuvers   CV:  RRR  no s3 or murmur or increase in P2, and no edema   ABD:  soft and nontender with nl inspiratory excursion in the supine position. No bruits or organomegaly appreciated, bowel sounds nl  MS:  Nl gait/ ext warm without deformities, calf tenderness, cyanosis or clubbing No obvious joint restrictions   SKIN: warm and dry without lesions    NEURO:  alert, approp, nl sensorium with  no motor or cerebellar deficits apparent.             Assessment & Plan:

## 2018-05-02 NOTE — Progress Notes (Signed)
PFT completed today.  

## 2018-05-14 LAB — LIPID PANEL
Chol/HDL Ratio: 5.9 ratio — ABNORMAL HIGH (ref 0.0–5.0)
Cholesterol, Total: 153 mg/dL (ref 100–199)
HDL: 26 mg/dL — ABNORMAL LOW (ref >39)
LDL Calculated: 53 mg/dL (ref 0–99)
Triglycerides: 371 mg/dL — ABNORMAL HIGH (ref 0–149)
VLDL Cholesterol Cal: 74 mg/dL — ABNORMAL HIGH (ref 5–40)

## 2018-05-15 ENCOUNTER — Encounter: Payer: Self-pay | Admitting: Internal Medicine

## 2018-05-15 ENCOUNTER — Telehealth: Payer: Self-pay | Admitting: Internal Medicine

## 2018-05-15 ENCOUNTER — Ambulatory Visit: Payer: Medicare Other | Admitting: Internal Medicine

## 2018-05-15 VITALS — BP 140/88 | HR 64 | Ht 70.5 in | Wt 197.0 lb

## 2018-05-15 DIAGNOSIS — E785 Hyperlipidemia, unspecified: Secondary | ICD-10-CM | POA: Diagnosis not present

## 2018-05-15 DIAGNOSIS — I739 Peripheral vascular disease, unspecified: Secondary | ICD-10-CM | POA: Diagnosis not present

## 2018-05-15 DIAGNOSIS — I2510 Atherosclerotic heart disease of native coronary artery without angina pectoris: Secondary | ICD-10-CM

## 2018-05-15 MED ORDER — ICOSAPENT ETHYL 1 G PO CAPS: 2.0000 g | ORAL_CAPSULE | Freq: Two times a day (BID) | ORAL | 5 refills | Status: DC

## 2018-05-15 NOTE — Patient Instructions (Signed)
Medication Instructions:  START vascepa 2 gram twice daily (2 capsules twice daily) Continue other current medications If you need a refill on your cardiac medications before your next appointment, please call your pharmacy.   Lab work: FASTING LAB WORK in 3-4 months to recheck cholesterol Nothing to eat/drink after midnight - water is OK If you have labs (blood work) drawn today and your tests are completely normal, you will receive your results only by: Marland Kitchen MyChart Message (if you have MyChart) OR . A paper copy in the mail If you have any lab test that is abnormal or we need to change your treatment, we will call you to review the results.  Follow-Up: At [facility], you and your health needs are our priority.  As part of our continuing mission to provide you with exceptional heart care, we have created designated Provider Care Teams.  These Care Teams include your primary Cardiologist (physician) and Advanced Practice Providers (APPs -  Physician Assistants and Nurse Practitioners) who all work together to provide you with the care you need, when you need it. . You will need a follow up appointment in 3-4 months with Dr. Debara Pickett - LIPID CLINIC

## 2018-05-15 NOTE — Telephone Encounter (Signed)
LMTCB

## 2018-05-15 NOTE — Telephone Encounter (Signed)
Follow up    Pt wife Returning call

## 2018-05-15 NOTE — Telephone Encounter (Signed)
Called [facility] to follow up on price. Per pharmacy rep, $150 is patient's copay regardless of annual medicare drug deductible requirements.   Routed to MD for recommendations

## 2018-05-15 NOTE — Telephone Encounter (Signed)
Still think he needs Vascepa - but if it is cost prohibitive, then we will just continue his current meds.  Dr. Lemmie Evens

## 2018-05-15 NOTE — Telephone Encounter (Signed)
  Wife is calling to let Cody Moss know that the new medication Cody Moss wants to put pt on is going to cost the patient $150 out of pocket. Wife states they cannot afford that and would like to know if there is something else he can take that is cheaper
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
## 2018-05-15 NOTE — Telephone Encounter (Signed)
Returned call to patient's wife. Notified her of MD recommendations concerning vascepa. She voiced understanding - they cannot afford this right now.

## 2018-08-22 ENCOUNTER — Telehealth: Payer: Self-pay | Admitting: Internal Medicine

## 2018-08-22 NOTE — Telephone Encounter (Signed)
LMTCB about appt schedule for 5/5 with Dr. Debara Pickett, offer his a virtual appt only if he had lab work done otherwise he will need to be reschedule to another lipid clinic day.

## 2018-08-25 ENCOUNTER — Other Ambulatory Visit: Payer: Medicare Other

## 2018-08-25 ENCOUNTER — Telehealth: Payer: Self-pay | Admitting: Internal Medicine

## 2018-08-25 LAB — LIPID PANEL
Chol/HDL Ratio: 4.2 ratio (ref 0.0–5.0)
Cholesterol, Total: 93 mg/dL — ABNORMAL LOW (ref 100–199)
HDL: 22 mg/dL — ABNORMAL LOW (ref >39)
LDL Calculated: 11 mg/dL (ref 0–99)
Triglycerides: 298 mg/dL — ABNORMAL HIGH (ref 0–149)
VLDL Cholesterol Cal: 60 mg/dL — ABNORMAL HIGH (ref 5–40)

## 2018-08-25 NOTE — Telephone Encounter (Signed)
Smartphone/ my chart via email/ consent/ pre reg completed

## 2018-08-26 ENCOUNTER — Ambulatory Visit: Payer: Medicare Other | Admitting: Internal Medicine

## 2018-08-26 ENCOUNTER — Telehealth (INDEPENDENT_AMBULATORY_CARE_PROVIDER_SITE_OTHER): Payer: Medicare Other | Admitting: Internal Medicine

## 2018-08-26 ENCOUNTER — Encounter: Payer: Self-pay | Admitting: Internal Medicine

## 2018-08-26 VITALS — BP 163/79 | HR 62 | Ht 70.0 in | Wt 199.0 lb

## 2018-08-26 DIAGNOSIS — E782 Mixed hyperlipidemia: Secondary | ICD-10-CM

## 2018-08-26 DIAGNOSIS — I10 Essential (primary) hypertension: Secondary | ICD-10-CM | POA: Diagnosis not present

## 2018-08-26 DIAGNOSIS — E785 Hyperlipidemia, unspecified: Secondary | ICD-10-CM | POA: Diagnosis not present

## 2018-08-26 DIAGNOSIS — I2510 Atherosclerotic heart disease of native coronary artery without angina pectoris: Secondary | ICD-10-CM

## 2018-08-26 DIAGNOSIS — Z8679 Personal history of other diseases of the circulatory system: Secondary | ICD-10-CM

## 2018-08-26 DIAGNOSIS — Z9889 Other specified postprocedural states: Secondary | ICD-10-CM

## 2018-08-26 DIAGNOSIS — I739 Peripheral vascular disease, unspecified: Secondary | ICD-10-CM

## 2018-08-26 DIAGNOSIS — Z7189 Other specified counseling: Secondary | ICD-10-CM
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
## 2018-08-26 NOTE — Patient Instructions (Signed)
Medication Instructions:  Continue current medications If you need a refill on your cardiac medications before your next appointment, please call your pharmacy.   Lab work: Fasting lab work in 6 months - due about 1 week prior to next visit with Dr. Debara Pickett If you have labs (blood work) drawn today and your tests are completely normal, you will receive your results only by: Marland Kitchen MyChart Message (if you have MyChart) OR . A paper copy in the mail If you have any lab test that is abnormal or we need to change your treatment, we will call you to review the results.  Testing/Procedures: You are due for 2 doppler studies - ABI (leg doppler) and aorta doppler. A scheduler will contact you about arranging these appointments. They are done @ [facility]  Follow-Up: At [facility], you and your health needs are our priority.  As part of our continuing mission to provide you with exceptional heart care, we have created designated Provider Care Teams.  These Care Teams include your primary Cardiologist (physician) and Advanced Practice Providers (APPs -  Physician Assistants and Nurse Practitioners) who all work together to provide you with the care you need, when you need it. You will need a follow up appointment in 6 months.  Please call our office 2 months in advance to schedule this appointment.  You may see No primary care provider on file. or one of the following Advanced Practice Providers on your designated Care Team: Almyra Deforest, [place] . Fabian Sharp, PA-C  Any Other Special Instructions Will Be Listed Below (If Applicable).

## 2018-10-01 ENCOUNTER — Ambulatory Visit (HOSPITAL_COMMUNITY)
Admission: RE | Admit: 2018-10-01 | Discharge: 2018-10-01 | Disposition: A | Discharge status: Home / Self Care | Payer: Medicare Other | Source: Ambulatory Visit | Attending: Cardiology | Admitting: Cardiology

## 2018-10-01 ENCOUNTER — Ambulatory Visit (HOSPITAL_BASED_OUTPATIENT_CLINIC_OR_DEPARTMENT_OTHER)
Admission: RE | Admit: 2018-10-01 | Discharge: 2018-10-01 | Disposition: A | Discharge status: Home / Self Care | Payer: Medicare Other | Source: Ambulatory Visit | Attending: Cardiology | Admitting: Cardiology

## 2018-10-01 ENCOUNTER — Other Ambulatory Visit: Payer: Self-pay

## 2018-10-01 DIAGNOSIS — I739 Peripheral vascular disease, unspecified: Secondary | ICD-10-CM

## 2018-10-01 DIAGNOSIS — Z9889 Other specified postprocedural states: Secondary | ICD-10-CM | POA: Insufficient documentation

## 2018-10-01 DIAGNOSIS — I714 Abdominal aortic aneurysm, without rupture, unspecified: Secondary | ICD-10-CM

## 2018-11-19 ENCOUNTER — Other Ambulatory Visit (HOSPITAL_COMMUNITY): Payer: Self-pay | Admitting: Urology

## 2018-11-19 ENCOUNTER — Other Ambulatory Visit: Payer: Self-pay | Admitting: Urology

## 2018-11-19 DIAGNOSIS — C61 Malignant neoplasm of prostate: Secondary | ICD-10-CM

## 2018-12-15 ENCOUNTER — Telehealth: Payer: Self-pay | Admitting: Internal Medicine

## 2018-12-15 NOTE — Telephone Encounter (Signed)
New Message:   Pt is scheduled to see Dr Debara Pickett on 03-04-19 for his [place]. Wife wants to know if he needs lab work before this appt please?

## 2018-12-15 NOTE — Telephone Encounter (Signed)
Kwame Ryland (spouse) notified to have fasting lipid panel done at least 3 days prior to appt

## 2018-12-30 ENCOUNTER — Telehealth: Payer: Self-pay | Admitting: Internal Medicine

## 2018-12-30 NOTE — Telephone Encounter (Signed)
New Message     Pts wife is calling because she is wondering if the pt needs to have blood work done before his appt. She said he had blood work done at his PC     Please call

## 2018-12-30 NOTE — Telephone Encounter (Signed)
Spoke to patient's wife about concerns with the patient's lab work. She was made aware that the patient's labs are accessible from his PCP visit on 12/23/2018 and that the patient did not need to get blood work again before his November appt.

## 2019-02-03 ENCOUNTER — Other Ambulatory Visit: Payer: Self-pay

## 2019-02-03 DIAGNOSIS — Z20822 Contact with and (suspected) exposure to covid-19: Secondary | ICD-10-CM

## 2019-02-04 LAB — NOVEL CORONAVIRUS, NAA: SARS-CoV-2, NAA: NOT DETECTED

## 2019-02-25 ENCOUNTER — Telehealth: Payer: Self-pay | Admitting: Internal Medicine

## 2019-02-25 NOTE — Telephone Encounter (Signed)
[employer] patient assistance application mailed to patient with instructions to complete & return

## 2019-03-02 LAB — LIPID PANEL
Chol/HDL Ratio: 5 ratio (ref 0.0–5.0)
Cholesterol, Total: 104 mg/dL (ref 100–199)
HDL: 21 mg/dL — ABNORMAL LOW (ref >39)
LDL Chol Calc (NIH): 45 mg/dL (ref 0–99)
Triglycerides: 241 mg/dL — ABNORMAL HIGH (ref 0–149)
VLDL Cholesterol Cal: 38 mg/dL (ref 5–40)

## 2019-03-04 ENCOUNTER — Encounter: Payer: Self-pay | Admitting: Internal Medicine

## 2019-03-04 ENCOUNTER — Ambulatory Visit: Payer: Medicare Other | Admitting: Internal Medicine

## 2019-03-04 ENCOUNTER — Other Ambulatory Visit: Payer: Self-pay

## 2019-03-04 VITALS — BP 156/77 | HR 59 | Ht 70.0 in | Wt 189.6 lb

## 2019-03-04 DIAGNOSIS — Z8679 Personal history of other diseases of the circulatory system: Secondary | ICD-10-CM

## 2019-03-04 DIAGNOSIS — E785 Hyperlipidemia, unspecified: Secondary | ICD-10-CM

## 2019-03-04 DIAGNOSIS — I10 Essential (primary) hypertension: Secondary | ICD-10-CM | POA: Diagnosis not present

## 2019-03-04 DIAGNOSIS — Z9889 Other specified postprocedural states: Secondary | ICD-10-CM

## 2019-03-04 DIAGNOSIS — I739 Peripheral vascular disease, unspecified: Secondary | ICD-10-CM

## 2019-03-04 MED ORDER — OLMESARTAN MEDOXOMIL 20 MG PO TABS: 20.0000 mg | ORAL_TABLET | Freq: Two times a day (BID) | ORAL | 3 refills | Status: DC

## 2019-03-04 NOTE — Patient Instructions (Signed)
Medication Instructions:  INCREASE benicar to 20mg  twice daily Conitnue other current medications  *If you need a refill on your cardiac medications before your next appointment, please call your pharmacy*  Lab Work: FASTING lab work before next appointment  If you have labs (blood work) drawn today and your tests are completely normal, you will receive your results only by: Marland Kitchen MyChart Message (if you have MyChart) OR . A paper copy in the mail If you have any lab test that is abnormal or we need to change your treatment, we will call you to review the results.  Testing/Procedures: Vascular Testing - June 2021  Follow-Up: At [facility], you and your health needs are our priority.  As part of our continuing mission to provide you with exceptional heart care, we have created designated Provider Care Teams.  These Care Teams include your primary Cardiologist (physician) and Advanced Practice Providers (APPs -  Physician Assistants and Nurse Practitioners) who all work together to provide you with the care you need, when you need it.  Your next appointment:   6-7 months  The format for your next appointment:   In Person  Provider:   K. Mali Hilty, MD  Other Instructions
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
## 2019-03-05 ENCOUNTER — Ambulatory Visit: Payer: Medicare Other | Admitting: Internal Medicine

## 2019-03-05 ENCOUNTER — Telehealth: Payer: Self-pay | Admitting: Internal Medicine

## 2019-03-05 NOTE — Telephone Encounter (Signed)
PA for repatha sureclick submitted via [email or web address] (Key: [id number])

## 2019-03-05 NOTE — Telephone Encounter (Signed)
PA approved until 04/22/2020

## 2019-03-06 NOTE — Telephone Encounter (Signed)
Application faxed to [phone]

## 2019-03-10 ENCOUNTER — Ambulatory Visit (HOSPITAL_COMMUNITY)
Admission: RE | Admit: 2019-03-10 | Discharge: 2019-03-10 | Disposition: A | Discharge status: Home / Self Care | Payer: Medicare Other | Source: Ambulatory Visit | Attending: Urology | Admitting: Urology

## 2019-03-10 ENCOUNTER — Other Ambulatory Visit: Payer: Self-pay

## 2019-03-10 ENCOUNTER — Encounter (HOSPITAL_COMMUNITY)
Admission: RE | Admit: 2019-03-10 | Discharge: 2019-03-10 | Disposition: A | Discharge status: Home / Self Care | Payer: Medicare Other | Source: Ambulatory Visit | Attending: Urology | Admitting: Urology

## 2019-03-10 DIAGNOSIS — C61 Malignant neoplasm of prostate: Secondary | ICD-10-CM | POA: Diagnosis not present

## 2019-03-10 MED ORDER — TECHNETIUM TC 99M MEDRONATE IV KIT
21.0000 | PACK | Freq: Once | INTRAVENOUS | Status: AC
  Administered 2019-03-10: 21 via INTRAVENOUS

## 2019-03-16 NOTE — Telephone Encounter (Signed)
Patient has been denied coverage for repatha patient assistance from [place].   MyChart message sent, notifying him of this and about [email or web address] application

## 2019-03-17 ENCOUNTER — Other Ambulatory Visit: Payer: Self-pay | Admitting: Internal Medicine

## 2019-03-17 ENCOUNTER — Telehealth: Payer: Self-pay | Admitting: Internal Medicine

## 2019-03-17 MED ORDER — REPATHA SURECLICK 140 MG/ML ~~LOC~~ SOAJ: 1.0000 | SUBCUTANEOUS | 5 refills | Status: DC

## 2019-03-17 NOTE — Telephone Encounter (Signed)
New Message:    Please call, she wants to talk to you about pt's Repatha.

## 2019-03-17 NOTE — Telephone Encounter (Signed)
°*  STAT* If patient is at the pharmacy, call can be transferred to refill team.   1. Which medications need to be refilled? (please list name of each medication and dose if known) Repatha  2. Which pharmacy/location (including street and city if local pharmacy) is medication to be sent to? [facility]- [phone]  3. Do they need a 30 day or 90 day supply? 2 packs and refills

## 2019-03-17 NOTE — Telephone Encounter (Signed)
Verbal refill order for Repatha Sureclick called to [facility]

## 2019-03-17 NOTE — Telephone Encounter (Signed)
Spoke with patient's wife. [facility]. She will check on cost/co-pay. She was encouraged to have patient apply for healthwellfoundation assistance. She will call back with update.

## 2019-03-17 NOTE — Telephone Encounter (Signed)
Patient's wife reports [facility] psych[facility] needs new Rx for Repatha. Will contact [employer] They request 90 day supply for refill

## 2019-03-26 ENCOUNTER — Telehealth: Payer: Self-pay | Admitting: Internal Medicine

## 2019-03-26 NOTE — Telephone Encounter (Signed)
[facility] Patient's wife states they have not gotten refill for Repatha from [place], despite my calling [employer] last wekk.   Advised will call [employer] to follow up on last refill from [facility]

## 2019-03-26 NOTE — Telephone Encounter (Signed)
Patient's wife calling for update on her husbands medication for Evolocumab (REPATHA SURECLICK) 146 MG/ML SOAJ. States [facility] and they do not have the prescription. I told her that [place] stated, "Verbal refill order for Repatha Sureclick called to [facility]"  She would still like to speak with Jenna in regards to what she should do now.

## 2019-03-26 NOTE — Telephone Encounter (Signed)
Evolocumab (REPATHA SURECLICK) 073 MG/ML SOAJ 2 pen 5 03/17/2019    Sig - Route: Inject 1 Dose into the skin every 14 (fourteen) days. - Subcutaneous   Sent to pharmacy as: Evolocumab (REPATHA SURECLICK) 710 MG/ML Solution Auto-injector   E-Prescribing Status: Receipt confirmed by pharmacy (03/17/2019 1:22 PM EST)   Pharmacy  [id number] - [place], [address]

## 2019-03-26 NOTE — Telephone Encounter (Signed)
Called [employer] @ [phone] and was able to authorize verbal refill for Repatha Sureclick 90 day supply with 4 refills. Spoke with pharmacist [facility]. Per Cody Moss, Rx should ship out, patient will not need to call to initiate shipment for first supply.

## 2019-04-07 ENCOUNTER — Ambulatory Visit: Payer: Medicare Other | Admitting: Internal Medicine

## 2019-05-01 ENCOUNTER — Other Ambulatory Visit (HOSPITAL_COMMUNITY)
Admission: RE | Admit: 2019-05-01 | Discharge: 2019-05-01 | Disposition: A | Discharge status: Home / Self Care | Payer: Medicare Other | Source: Ambulatory Visit | Attending: Internal Medicine | Admitting: Internal Medicine

## 2019-05-01 DIAGNOSIS — Z20822 Contact with and (suspected) exposure to covid-19: Secondary | ICD-10-CM | POA: Diagnosis not present

## 2019-05-01 DIAGNOSIS — Z01812 Encounter for preprocedural laboratory examination: Secondary | ICD-10-CM | POA: Insufficient documentation

## 2019-05-01 LAB — SARS CORONAVIRUS 2 (TAT 6-24 HRS): SARS Coronavirus 2: NEGATIVE

## 2019-05-04 ENCOUNTER — Other Ambulatory Visit: Payer: Self-pay

## 2019-05-04 ENCOUNTER — Ambulatory Visit (INDEPENDENT_AMBULATORY_CARE_PROVIDER_SITE_OTHER): Payer: Medicare Other | Admitting: Internal Medicine

## 2019-05-04 ENCOUNTER — Ambulatory Visit: Payer: Medicare Other | Admitting: Internal Medicine

## 2019-05-04 ENCOUNTER — Other Ambulatory Visit: Payer: Self-pay | Admitting: *Deleted

## 2019-05-04 DIAGNOSIS — J8410 Pulmonary fibrosis, unspecified: Secondary | ICD-10-CM

## 2019-05-04 LAB — PULMONARY FUNCTION TEST
DL/VA % pred: 84 %
DL/VA: 3.33 ml/min/mmHg/L
DLCO unc % pred: 72 %
DLCO unc: 17.76 ml/min/mmHg
FEF 25-75 Post: 3.46 L/sec
FEF 25-75 Pre: 2.3 L/sec
FEF2575-%Change-Post: 50 %
FEF2575-%Pred-Post: 162 %
FEF2575-%Pred-Pre: 107 %
FEV1-%Change-Post: 10 %
FEV1-%Pred-Post: 93 %
FEV1-%Pred-Pre: 84 %
FEV1-Post: 2.78 L
FEV1-Pre: 2.51 L
FEV1FVC-%Change-Post: 2 %
FEV1FVC-%Pred-Pre: 106 %
FEV6-%Change-Post: 8 %
FEV6-%Pred-Post: 91 %
FEV6-%Pred-Pre: 84 %
FEV6-Post: 3.5 L
FEV6-Pre: 3.24 L
FEV6FVC-%Change-Post: 0 %
FEV6FVC-%Pred-Post: 106 %
FEV6FVC-%Pred-Pre: 106 %
FVC-%Change-Post: 7 %
FVC-%Pred-Post: 85 %
FVC-%Pred-Pre: 79 %
FVC-Post: 3.52 L
FVC-Pre: 3.26 L
Post FEV1/FVC ratio: 79 %
Post FEV6/FVC ratio: 100 %
Pre FEV1/FVC ratio: 77 %
Pre FEV6/FVC Ratio: 99 %
RV % pred: 122 %
RV: 3.11 L
TLC % pred: 91 %
TLC: 6.33 L

## 2019-05-04 NOTE — Progress Notes (Signed)
Full PFT performed today. °

## 2019-05-11 ENCOUNTER — Ambulatory Visit: Payer: Medicare Other | Admitting: Internal Medicine

## 2019-05-11 ENCOUNTER — Encounter: Payer: Self-pay | Admitting: Internal Medicine

## 2019-05-11 ENCOUNTER — Other Ambulatory Visit: Payer: Self-pay

## 2019-05-11 DIAGNOSIS — J8410 Pulmonary fibrosis, unspecified: Secondary | ICD-10-CM | POA: Diagnosis not present

## 2019-05-11 DIAGNOSIS — F17210 Nicotine dependence, cigarettes, uncomplicated: Secondary | ICD-10-CM

## 2019-05-11 LAB — SEDIMENTATION RATE: Sed Rate: 18 mm/hr (ref 0–20)

## 2019-05-11 NOTE — Progress Notes (Signed)
Subjective:    Patient ID: Cody Moss, male    DOB: 26-Jul-1940,    MRN: [id number]    Brief patient profile:  9 yowm active smoker with prostate ca s/p prostatectomy / RT 2004 with serial f/u by Dr [name] who referred him to pulmonary clinic 02/06/2016 for eval of abn CT c/w PF in bases 01/11/16   No h/o chemo exposures,  ALI, macrodantin or Connective tissue dz or inflammatory bowel dz    History of Present Illness  02/06/2016 1st [facility] office visit/ Cody Moss   Chief Complaint  Patient presents with  . Advice Only    Referred by [name], Pt. states he had a bone density scan was told to follow up here, Pt. denies SOB, wheezing,chest pain or chest tightness, Pt. 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  Schedule a full set of PFTs at [facility] in the next 4 weeks  GERD diet     08/06/2016  f/u ov/Cody Moss re:  Active smoker / ? PF  Some cough due to pnds attributed to allergies/ rx zyrtec  Chief Complaint  Patient presents with  . 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 Follow-up    PFT' done today. He has some sinus congestion and "allergies"- taking zyrtec daily. He has a non prod cough. Breathing is unchanged since the last visit.    walking x 20 min s stopping slow pace Cough  attributes to pnds / better on zyrtec  Off gerd rx on maint basis x 3 weeks > no change  Cough or sob  rec GERD diet The key is to stop smoking completely before smoking completely stops you!       08/06/2017  f/u ov/Cody Moss re:  PF favor nsip  Chief Complaint  Patient presents with  . Follow-up    6 month follow up. Denies any current breathing problems. States he has been doing well since his last visit. Had a 53mw this morning.   Dyspnea:  Not limited by breathing from desired activities   Cough: assoc with drainage better with zyrtec Sleeps flat fine rec Please see patient coordinator before you leave today  to schedule HRCT of Chest  And I will call you with results The key is to stop smoking completely before smoking completely stops you!      05/02/2018  f/u ov/Cody Moss re:  PF/ active smoker  Chief Complaint  Patient presents with  . Pulmonary Fibrosis    Breathing is doing well. Denies chest tightness, wheezing, SOB or coughing. PFT was completed today.  Dyspnea:  Walking 55min mile around track never checks sats  Cough: none Sleeping: flat SABA use: none  02: none  rec Keep monitoring your 02 sats toward the end of  your walk (not after you stop) and let me know if trending down The key is to stop smoking completely before smoking completely stops you! For smoking cessation classes call [phone]    Suggested e-cigs only as an optional  "one way bridge"  Off all tobacco products    05/11/2019  f/u ov/Cody Moss re: prob nsip in active smoker  Chief Complaint  Patient presents with  . Follow-up    PFT done 05/04/19. Breathing is doing well and he denies any new co's.   Dyspnea:  Still doing 15 min mile 4 x track with sats mid 90s / arthritis slows him down, not breathing Cough: none Sleeping: flat /one pillow SABA use: none 02: none    No obvious day to day or daytime variability or assoc excess/ purulent sputum or mucus plugs or hemoptysis or cp or chest tightness,  subjective wheeze or overt sinus or hb symptoms.   00 without nocturnal  or early am exacerbation  of respiratory  c/o's or need for noct saba. Also denies any obvious fluctuation of symptoms with weather or environmental changes or other aggravating or alleviating factors except as outlined above   No unusual exposure hx or h/o childhood pna/ asthma or knowledge of premature birth.  Current Allergies, Complete Past Medical History, Past Surgical History, Family History, and Social History were reviewed in [employer] record.  ROS  The following are not active complaints unless bolded Hoarseness, sore throat, dysphagia, dental problems, itching, sneezing,  nasal congestion or discharge of excess mucus or purulent secretions, ear ache,   fever, chills, sweats, unintended wt loss or wt gain, classically pleuritic or exertional cp,  orthopnea pnd or arm/hand swelling  or leg swelling, presyncope, palpitations, abdominal pain, anorexia, nausea, vomiting, diarrhea  or change in bowel habits or change in bladder habits, change in stools or change in urine, dysuria, hematuria,  rash, arthralgias, visual complaints, headache, numbness, weakness or ataxia or problems with walking or coordination,  change in mood or  memory.        Current Meds  Medication Sig  . allopurinol (ZYLOPRIM) 300 MG tablet Take 300 mg by mouth daily.  . bicalutamide (CASODEX) 50 MG tablet Take 50 mg by mouth at bedtime.   . Coenzyme Q10 (CO Q10 PO) Take 300 mg by mouth daily.  . colchicine 0.6 MG tablet Take 1 tablet (0.6 mg total) by mouth daily. (Patient taking differently: Take 0.6 mg by mouth daily as needed. For gout)  . Evolocumab (REPATHA SURECLICK) 185 MG/ML SOAJ Inject 1 Dose into the skin every 14 (fourteen) days.  . fenofibrate 160 MG tablet Take 160 mg by mouth every evening.  . finasteride (PROSCAR) 5 MG tablet Take 5 mg by mouth at bedtime.   . metFORMIN (GLUCOPHAGE) 1000 MG tablet Take 0.5  tablets (500 mg total) by mouth 2 (two) times daily with a meal.  . metoprolol (LOPRESSOR) 50 MG tablet Take 25 mg by mouth 2 (two) times daily.   Marland Kitchen olmesartan (BENICAR) 20 MG tablet Take 1 tablet (20 mg total) by mouth 2 (two) times daily.  Marland Kitchen oxyCODONE-acetaminophen (PERCOCET) 10-325 MG tablet Take 1 tablet by mouth every 4 (four) hours as needed for pain.  . pantoprazole (PROTONIX) 40 MG tablet Take 40 mg by mouth daily as needed.  . triamcinolone cream (KENALOG) 0.1 % Apply 1 application topically as directed.  Objective:  Physical Exam   Wt Readings from Last 3 Encounters:  05/11/19 192 lb (87.1 kg)  03/04/19 189 lb 9.6 oz (86 kg)  08/26/18 199 lb (90.3 kg)     Vital signs reviewed - Note on arrival 02 sats  99% on RA   HEENT : pt wearing mask not removed for exam due to covid -19 concerns.    NECK :  without JVD/Nodes/TM/ nl carotid upstrokes bilaterally   LUNGS: no acc muscle use,  Nl contour chest which is clear to A and P bilaterally without cough on insp or exp maneuvers   CV:  RRR  no s3 or murmur or increase in P2, and no edema   ABD:  soft and nontender with nl inspiratory excursion in the supine position. No bruits or organomegaly appreciated, bowel sounds nl  MS:  Nl gait/ ext warm without deformities, calf tenderness, cyanosis or clubbing No obvious joint restrictions   SKIN: warm and dry without lesions    NEURO:  alert, approp, nl sensorium with  no motor or cerebellar deficits apparent.    Labs ordered 05/11/2019  :  HSP serology/ collagen vasc profile

## 2019-05-11 NOTE — Assessment & Plan Note (Signed)
See CT abd 01/11/16 (incidental finding of pf)  - 02/06/2016  Walked RA x 3 laps @ 185 ft each stopped due to  End of study, fast pace, no sob or desat    - PFT's  02/20/16   FVC  3.94 (91%) s obst and s rx  prior to study with DLCO  60 % corrects to 67  % for alv volume   - 08/06/2016 Walked RA x 3 laps @ 185 ft each stopped due to  End of study, fast pace, no sob or desat    - HRCT 08/13/16 >>> The appearance of the chest is compatible with interstitial lung disease. 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 Findings are indeterminate for usual interstitial pneumonitis by ATS Fleischner criteria. - HRCT 02/14/2018  1. Fibrotic changes in the lungs appear stable compared to prior studies dating back to 08/14/2016, again classified as indeterminate for usual interstitial pneumonia (UIP) per ATS guidelines. 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 2. Mild diffuse bronchial wall thickening with mild centrilobular and paraseptal emphysema. 3. Aortic atherosclerosis, in addition to left main and 3 vessel coronary artery disease - PFT's  05/02/2018  FVC  3.59 (86%) s obst on no rx  prior to study with DLCO  61 % corrects to 74  % for alv volume    No evidence of dz progression clinically despite active smoking (see separate a/p)   No change in rx needed for now.    Discussed in detail all the  indications,  usual  risks and alternatives  relative to the benefits with patient who agrees to proceed with conservative f/u q 6 m, sooner if needed  Also: Pt informed of the seriousness of COVID 19 infection as a direct risk to lung health  and safey and to close contacts and should continue to wear a facemask in public and minimize exposure to public locations but especially avoid any area or activity where non-close contacts are not observing distancing or wearing an appropriate face mask.  I strongly recommended vaccine when offered.

## 2019-05-11 NOTE — Patient Instructions (Addendum)
Keep monitoring your 02 sats toward the end of your walk (not after you stop) and let me know if trending down  The key is to stop smoking completely before smoking completely stops you!   Please remember to go to the lab department   for your tests - we will call you with the results when they are available.        Otherwise return in 12 months with pfts on return with PFTs

## 2019-05-11 NOTE — Assessment & Plan Note (Signed)
Counseled re importance of smoking cessation but did not meet time criteria for separate billing          Each maintenance medication was reviewed in detail including emphasizing most importantly the difference between maintenance and prns and under what circumstances the prns are to be triggered using an action plan format that is not reflected in the computer generated alphabetically organized AVS which I have not found useful in most complex patients, especially with respiratory illnesses  Total time for H and P, chart review, counseling,   and generating AVS / charting =  30 min

## 2019-05-17 LAB — HYPERSENSITIVITY PNUEMONITIS PROFILE
ASPERGILLUS FUMIGATUS: NEGATIVE
Faenia retivirgula: NEGATIVE
Pigeon Serum: NEGATIVE
S. VIRIDIS: NEGATIVE
T. CANDIDUS: NEGATIVE
T. VULGARIS: NEGATIVE

## 2019-05-17 LAB — CYCLIC CITRUL PEPTIDE ANTIBODY, IGG: Cyclic Citrullin Peptide Ab: <16 UNITS

## 2019-05-17 LAB — RHEUMATOID FACTOR: Rhuematoid fact SerPl-aCnc: <14 IU/mL (ref <14)

## 2019-05-17 LAB — ANA: Anti Nuclear Antibody (ANA): NEGATIVE

## 2019-05-20 ENCOUNTER — Telehealth: Payer: Self-pay | Admitting: Internal Medicine

## 2019-05-20 NOTE — Telephone Encounter (Signed)
Cody Moss, [place]  05/19/2019 5:12 PM EST    LMTCB   Tanda Rockers, MD  05/18/2019 6:29 AM EST    Call patient : Studies are unremarkable, no change in recs  -------------------------------------------- Spoke with pt. He is aware of results. Nothing further was needed.

## 2019-07-17 ENCOUNTER — Ambulatory Visit: Payer: Medicare Other | Attending: Internal Medicine

## 2019-07-17 DIAGNOSIS — Z20822 Contact with and (suspected) exposure to covid-19: Secondary | ICD-10-CM

## 2019-07-18 LAB — NOVEL CORONAVIRUS, NAA: SARS-CoV-2, NAA: NOT DETECTED

## 2019-07-18 LAB — SARS-COV-2, NAA 2 DAY TAT

## 2019-08-11 ENCOUNTER — Telehealth: Payer: Self-pay | Admitting: Internal Medicine

## 2019-08-11 NOTE — Telephone Encounter (Signed)
New Message    Pt c/o medication issue:  1. Name of Medication: Evolocumab (REPATHA SURECLICK) 373 MG/ML SOAJ  2. How are you currently taking this medication (dosage and times per day)? As prescribed   3. Are you having a reaction (difficulty breathing--STAT)? No   4. What is your medication issue? Pts wife is calling and says the pt is having a lot of pain in his hips and legs. She says she was told repatha is a statin drug and the pt cannot take statin medication. She says he needs to take something else because of the pain he is having    Please call back after lunch

## 2019-08-11 NOTE — Telephone Encounter (Signed)
Spoke with the patient and his wife. Provided education about Repatha and reiterated it is not a statin.  The patient reports leg cramping and general pain, but especially R hip pain for "several weeks." He actually reports the R hip has given him problems for years. The [place] are both on the phone and are contradictory (she reports his pain has gotten worse since starting Repatha, he reports his already existing pain has worsened). Cody Moss has an appointment with Dr. Ninfa Linden to check out his hip on Wednesday.  His next Repatha injection is scheduled for tomorrow as well.  At this time, he will hold Repatha injection and see Dr. Ninfa Linden tomorrow. They will call tomorrow or Thursday with an update and instructions from Dr. Debara Pickett.

## 2019-08-12 ENCOUNTER — Ambulatory Visit: Payer: Medicare Other | Admitting: Orthopaedic Surgery

## 2019-08-12 ENCOUNTER — Other Ambulatory Visit: Payer: Self-pay

## 2019-08-12 ENCOUNTER — Encounter: Payer: Self-pay | Admitting: Orthopaedic Surgery

## 2019-08-12 ENCOUNTER — Ambulatory Visit (INDEPENDENT_AMBULATORY_CARE_PROVIDER_SITE_OTHER): Payer: Medicare Other

## 2019-08-12 VITALS — Ht 70.5 in | Wt 193.0 lb

## 2019-08-12 DIAGNOSIS — M25551 Pain in right hip: Secondary | ICD-10-CM | POA: Diagnosis not present

## 2019-08-12 MED ORDER — METHYLPREDNISOLONE ACETATE 40 MG/ML IJ SUSP
40.0000 mg | INTRAMUSCULAR | Status: AC | PRN
  Administered 2019-08-12: 14:00:00 40 mg via INTRA_ARTICULAR

## 2019-08-12 MED ORDER — LIDOCAINE HCL 1 % IJ SOLN
3.0000 mL | INTRAMUSCULAR | Status: AC | PRN
  Administered 2019-08-12: 3 mL
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
## 2019-08-19 ENCOUNTER — Other Ambulatory Visit: Payer: Self-pay

## 2019-08-19 ENCOUNTER — Encounter: Payer: Self-pay | Admitting: Family Medicine

## 2019-08-19 ENCOUNTER — Ambulatory Visit: Payer: Medicare Other | Admitting: Family Medicine

## 2019-08-19 ENCOUNTER — Ambulatory Visit: Payer: Self-pay

## 2019-08-19 DIAGNOSIS — M25551 Pain in right hip: Secondary | ICD-10-CM | POA: Diagnosis not present
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
## 2019-09-09 ENCOUNTER — Ambulatory Visit: Payer: Medicare Other | Admitting: Orthopaedic Surgery

## 2019-09-10 ENCOUNTER — Telehealth: Payer: Self-pay | Admitting: Internal Medicine

## 2019-09-10 NOTE — Telephone Encounter (Signed)
Called patient 09/10/19, left message for patient to return call to be scheduled for follow up from Dr. Lysbeth Penner recall list

## 2019-09-14 ENCOUNTER — Other Ambulatory Visit: Payer: Self-pay

## 2019-09-14 ENCOUNTER — Ambulatory Visit: Payer: Self-pay

## 2019-09-14 ENCOUNTER — Ambulatory Visit: Payer: Medicare Other | Admitting: Orthopaedic Surgery

## 2019-09-14 ENCOUNTER — Encounter: Payer: Self-pay | Admitting: Orthopaedic Surgery

## 2019-09-14 DIAGNOSIS — G8929 Other chronic pain: Secondary | ICD-10-CM

## 2019-09-14 DIAGNOSIS — M25561 Pain in right knee: Secondary | ICD-10-CM | POA: Diagnosis not present

## 2019-09-14 DIAGNOSIS — M25562 Pain in left knee: Secondary | ICD-10-CM
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
## 2019-10-01 ENCOUNTER — Other Ambulatory Visit: Payer: Self-pay

## 2019-10-01 ENCOUNTER — Ambulatory Visit (HOSPITAL_COMMUNITY)
Admission: RE | Admit: 2019-10-01 | Discharge: 2019-10-01 | Disposition: A | Discharge status: Home / Self Care | Payer: Medicare Other | Source: Ambulatory Visit | Attending: Internal Medicine | Admitting: Internal Medicine

## 2019-10-01 ENCOUNTER — Ambulatory Visit (HOSPITAL_BASED_OUTPATIENT_CLINIC_OR_DEPARTMENT_OTHER)
Admission: RE | Admit: 2019-10-01 | Discharge: 2019-10-01 | Disposition: A | Discharge status: Home / Self Care | Payer: Medicare Other | Source: Ambulatory Visit | Attending: Internal Medicine | Admitting: Internal Medicine

## 2019-10-01 ENCOUNTER — Other Ambulatory Visit (HOSPITAL_COMMUNITY): Payer: Self-pay | Admitting: Internal Medicine

## 2019-10-01 DIAGNOSIS — I739 Peripheral vascular disease, unspecified: Secondary | ICD-10-CM

## 2019-10-01 DIAGNOSIS — Z9889 Other specified postprocedural states: Secondary | ICD-10-CM | POA: Diagnosis present

## 2019-10-01 DIAGNOSIS — Z95828 Presence of other vascular implants and grafts: Secondary | ICD-10-CM

## 2019-10-01 DIAGNOSIS — Z8679 Personal history of other diseases of the circulatory system: Secondary | ICD-10-CM

## 2019-10-02 ENCOUNTER — Other Ambulatory Visit: Payer: Self-pay

## 2019-10-02 ENCOUNTER — Telehealth: Payer: Self-pay | Admitting: Internal Medicine

## 2019-10-02 DIAGNOSIS — I739 Peripheral vascular disease, unspecified: Secondary | ICD-10-CM

## 2019-10-02 NOTE — Telephone Encounter (Signed)
It's Dr. Scot Dock Gae Gallop) - ok, please specify referral to Dr. Scot Dock at [facility].  Thanks,  Dr Lemmie Evens

## 2019-10-02 NOTE — Telephone Encounter (Signed)
Referral has been updated for the patient to see Dr. Scot Dock. Patient is aware to expect a call to get the appointment set up.

## 2019-10-02 NOTE — Telephone Encounter (Signed)
New message  Patient is calling in to let Dr. Debara Pickett know that the patient has decided on Dr. Bobette Mo with [facility] to do the surgery.

## 2019-10-20 ENCOUNTER — Encounter: Payer: Medicare Other | Admitting: Vascular Surgery

## 2019-11-15 ENCOUNTER — Emergency Department (HOSPITAL_COMMUNITY): Payer: Medicare Other

## 2019-11-15 ENCOUNTER — Inpatient Hospital Stay (HOSPITAL_COMMUNITY)
Admission: EM | Admit: 2019-11-15 | Discharge: 2019-11-20 | DRG: 871 | Disposition: A | Discharge status: Home / Self Care | Payer: Medicare Other | Attending: Internal Medicine | Admitting: Internal Medicine

## 2019-11-15 ENCOUNTER — Other Ambulatory Visit: Payer: Self-pay

## 2019-11-15 DIAGNOSIS — N400 Enlarged prostate without lower urinary tract symptoms: Secondary | ICD-10-CM | POA: Diagnosis present

## 2019-11-15 DIAGNOSIS — I10 Essential (primary) hypertension: Secondary | ICD-10-CM | POA: Diagnosis not present

## 2019-11-15 DIAGNOSIS — J159 Unspecified bacterial pneumonia: Secondary | ICD-10-CM | POA: Diagnosis present

## 2019-11-15 DIAGNOSIS — M1A9XX0 Chronic gout, unspecified, without tophus (tophi): Secondary | ICD-10-CM | POA: Diagnosis not present

## 2019-11-15 DIAGNOSIS — Z7982 Long term (current) use of aspirin: Secondary | ICD-10-CM

## 2019-11-15 DIAGNOSIS — D649 Anemia, unspecified: Secondary | ICD-10-CM | POA: Diagnosis present

## 2019-11-15 DIAGNOSIS — R7881 Bacteremia: Secondary | ICD-10-CM | POA: Diagnosis not present

## 2019-11-15 DIAGNOSIS — Z923 Personal history of irradiation: Secondary | ICD-10-CM | POA: Diagnosis not present

## 2019-11-15 DIAGNOSIS — K219 Gastro-esophageal reflux disease without esophagitis: Secondary | ICD-10-CM | POA: Diagnosis present

## 2019-11-15 DIAGNOSIS — R1012 Left upper quadrant pain: Secondary | ICD-10-CM | POA: Diagnosis not present

## 2019-11-15 DIAGNOSIS — E1122 Type 2 diabetes mellitus with diabetic chronic kidney disease: Secondary | ICD-10-CM | POA: Diagnosis present

## 2019-11-15 DIAGNOSIS — R1011 Right upper quadrant pain: Secondary | ICD-10-CM | POA: Diagnosis present

## 2019-11-15 DIAGNOSIS — J984 Other disorders of lung: Secondary | ICD-10-CM | POA: Diagnosis not present

## 2019-11-15 DIAGNOSIS — E872 Acidosis: Secondary | ICD-10-CM | POA: Diagnosis present

## 2019-11-15 DIAGNOSIS — J9601 Acute respiratory failure with hypoxia: Secondary | ICD-10-CM | POA: Diagnosis present

## 2019-11-15 DIAGNOSIS — Z7984 Long term (current) use of oral hypoglycemic drugs: Secondary | ICD-10-CM | POA: Diagnosis not present

## 2019-11-15 DIAGNOSIS — M109 Gout, unspecified: Secondary | ICD-10-CM | POA: Diagnosis present

## 2019-11-15 DIAGNOSIS — Z955 Presence of coronary angioplasty implant and graft: Secondary | ICD-10-CM

## 2019-11-15 DIAGNOSIS — I129 Hypertensive chronic kidney disease with stage 1 through stage 4 chronic kidney disease, or unspecified chronic kidney disease: Secondary | ICD-10-CM | POA: Diagnosis present

## 2019-11-15 DIAGNOSIS — N179 Acute kidney failure, unspecified: Secondary | ICD-10-CM | POA: Diagnosis present

## 2019-11-15 DIAGNOSIS — A4151 Sepsis due to Escherichia coli [E. coli]: Secondary | ICD-10-CM | POA: Diagnosis present

## 2019-11-15 DIAGNOSIS — R7989 Other specified abnormal findings of blood chemistry: Secondary | ICD-10-CM

## 2019-11-15 DIAGNOSIS — K8043 Calculus of bile duct with acute cholecystitis with obstruction: Secondary | ICD-10-CM

## 2019-11-15 DIAGNOSIS — J439 Emphysema, unspecified: Secondary | ICD-10-CM | POA: Diagnosis present

## 2019-11-15 DIAGNOSIS — E1151 Type 2 diabetes mellitus with diabetic peripheral angiopathy without gangrene: Secondary | ICD-10-CM | POA: Diagnosis present

## 2019-11-15 DIAGNOSIS — Z8546 Personal history of malignant neoplasm of prostate: Secondary | ICD-10-CM | POA: Diagnosis not present

## 2019-11-15 DIAGNOSIS — J8410 Pulmonary fibrosis, unspecified: Secondary | ICD-10-CM | POA: Diagnosis present

## 2019-11-15 DIAGNOSIS — R6520 Severe sepsis without septic shock: Secondary | ICD-10-CM | POA: Diagnosis present

## 2019-11-15 DIAGNOSIS — R109 Unspecified abdominal pain: Secondary | ICD-10-CM

## 2019-11-15 DIAGNOSIS — K8051 Calculus of bile duct without cholangitis or cholecystitis with obstruction: Secondary | ICD-10-CM | POA: Diagnosis not present

## 2019-11-15 DIAGNOSIS — E782 Mixed hyperlipidemia: Secondary | ICD-10-CM | POA: Diagnosis present

## 2019-11-15 DIAGNOSIS — K760 Fatty (change of) liver, not elsewhere classified: Secondary | ICD-10-CM | POA: Diagnosis present

## 2019-11-15 DIAGNOSIS — K8042 Calculus of bile duct with acute cholecystitis without obstruction: Secondary | ICD-10-CM | POA: Diagnosis not present

## 2019-11-15 DIAGNOSIS — K828 Other specified diseases of gallbladder: Secondary | ICD-10-CM | POA: Diagnosis present

## 2019-11-15 DIAGNOSIS — I2510 Atherosclerotic heart disease of native coronary artery without angina pectoris: Secondary | ICD-10-CM | POA: Diagnosis present

## 2019-11-15 DIAGNOSIS — F17210 Nicotine dependence, cigarettes, uncomplicated: Secondary | ICD-10-CM | POA: Diagnosis present

## 2019-11-15 DIAGNOSIS — Z79899 Other long term (current) drug therapy: Secondary | ICD-10-CM | POA: Diagnosis not present

## 2019-11-15 DIAGNOSIS — U071 COVID-19: Secondary | ICD-10-CM | POA: Diagnosis present

## 2019-11-15 DIAGNOSIS — G8929 Other chronic pain: Secondary | ICD-10-CM | POA: Diagnosis present

## 2019-11-15 DIAGNOSIS — R0602 Shortness of breath: Secondary | ICD-10-CM

## 2019-11-15 DIAGNOSIS — K8050 Calculus of bile duct without cholangitis or cholecystitis without obstruction: Secondary | ICD-10-CM

## 2019-11-15 DIAGNOSIS — N183 Chronic kidney disease, stage 3 unspecified: Secondary | ICD-10-CM | POA: Diagnosis present

## 2019-11-15 LAB — URINALYSIS, ROUTINE W REFLEX MICROSCOPIC
Bacteria, UA: NONE SEEN
Bilirubin Urine: NEGATIVE
Glucose, UA: NEGATIVE mg/dL
Hgb urine dipstick: NEGATIVE
Ketones, ur: NEGATIVE mg/dL
Nitrite: NEGATIVE
Protein, ur: NEGATIVE mg/dL
Specific Gravity, Urine: 1.038 — ABNORMAL HIGH (ref 1.005–1.030)
pH: 5 (ref 5.0–8.0)

## 2019-11-15 LAB — COMPREHENSIVE METABOLIC PANEL
ALT: 104 U/L — ABNORMAL HIGH (ref 0–44)
AST: 274 U/L — ABNORMAL HIGH (ref 15–41)
Albumin: 2.9 g/dL — ABNORMAL LOW (ref 3.5–5.0)
Alkaline Phosphatase: 80 U/L (ref 38–126)
Anion gap: 13 (ref 5–15)
BUN: 21 mg/dL (ref 8–23)
CO2: 19 mmol/L — ABNORMAL LOW (ref 22–32)
Calcium: 8.3 mg/dL — ABNORMAL LOW (ref 8.9–10.3)
Chloride: 104 mmol/L (ref 98–111)
Creatinine, Ser: 1.47 mg/dL — ABNORMAL HIGH (ref 0.61–1.24)
GFR calc Af Amer: 53 mL/min — ABNORMAL LOW (ref >60)
GFR calc non Af Amer: 46 mL/min — ABNORMAL LOW (ref >60)
Glucose, Bld: 229 mg/dL — ABNORMAL HIGH (ref 70–99)
Potassium: 3.6 mmol/L (ref 3.5–5.1)
Sodium: 136 mmol/L (ref 135–145)
Total Bilirubin: 1.9 mg/dL — ABNORMAL HIGH (ref 0.3–1.2)
Total Protein: 5.6 g/dL — ABNORMAL LOW (ref 6.5–8.1)

## 2019-11-15 LAB — CBC WITH DIFFERENTIAL/PLATELET
Abs Immature Granulocytes: 0.03 10*3/uL (ref 0.00–0.07)
Basophils Absolute: 0 10*3/uL (ref 0.0–0.1)
Basophils Relative: 0 %
Eosinophils Absolute: 0 10*3/uL (ref 0.0–0.5)
Eosinophils Relative: 0 %
HCT: 30.3 % — ABNORMAL LOW (ref 39.0–52.0)
Hemoglobin: 9.8 g/dL — ABNORMAL LOW (ref 13.0–17.0)
Immature Granulocytes: 0 %
Lymphocytes Relative: 3 %
Lymphs Abs: 0.2 10*3/uL — ABNORMAL LOW (ref 0.7–4.0)
MCH: 32.1 pg (ref 26.0–34.0)
MCHC: 32.3 g/dL (ref 30.0–36.0)
MCV: 99.3 fL (ref 80.0–100.0)
Monocytes Absolute: 0.5 10*3/uL (ref 0.1–1.0)
Monocytes Relative: 7 %
Neutro Abs: 6.8 10*3/uL (ref 1.7–7.7)
Neutrophils Relative %: 90 %
Platelets: 232 10*3/uL (ref 150–400)
RBC: 3.05 MIL/uL — ABNORMAL LOW (ref 4.22–5.81)
RDW: 14.3 % (ref 11.5–15.5)
WBC: 7.5 10*3/uL (ref 4.0–10.5)
nRBC: 0 % (ref 0.0–0.2)

## 2019-11-15 LAB — D-DIMER, QUANTITATIVE: D-Dimer, Quant: 2.87 ug{FEU}/mL — ABNORMAL HIGH (ref 0.00–0.50)

## 2019-11-15 LAB — LIPASE, BLOOD: Lipase: 29 U/L (ref 11–51)

## 2019-11-15 LAB — TRIGLYCERIDES: Triglycerides: 199 mg/dL — ABNORMAL HIGH (ref <150)

## 2019-11-15 LAB — FIBRINOGEN: Fibrinogen: 472 mg/dL (ref 210–475)

## 2019-11-15 LAB — SARS CORONAVIRUS 2 BY RT PCR (HOSPITAL ORDER, PERFORMED IN ~~LOC~~ HOSPITAL LAB): SARS Coronavirus 2: POSITIVE — AB

## 2019-11-15 LAB — FERRITIN: Ferritin: 384 ng/mL — ABNORMAL HIGH (ref 24–336)

## 2019-11-15 LAB — LACTATE DEHYDROGENASE: LDH: 259 U/L — ABNORMAL HIGH (ref 98–192)

## 2019-11-15 LAB — PROTIME-INR
INR: 1.1 (ref 0.8–1.2)
Prothrombin Time: 13.9 seconds (ref 11.4–15.2)

## 2019-11-15 LAB — LACTIC ACID, PLASMA
Lactic Acid, Venous: 4.5 mmol/L (ref 0.5–1.9)
Lactic Acid, Venous: 2.4 mmol/L (ref 0.5–1.9)

## 2019-11-15 LAB — PROCALCITONIN: Procalcitonin: 38.17 ng/mL

## 2019-11-15 LAB — APTT: aPTT: 25 seconds (ref 24–36)

## 2019-11-15 LAB — C-REACTIVE PROTEIN: CRP: 4.7 mg/dL — ABNORMAL HIGH (ref <1.0)

## 2019-11-15 MED ORDER — DEXAMETHASONE 6 MG PO TABS
6.0000 mg | ORAL_TABLET | ORAL | Status: DC
  Administered 2019-11-16 – 2019-11-19 (×4): 6 mg via ORAL
  Filled 2019-11-15 (×4): qty 1

## 2019-11-15 MED ORDER — ONDANSETRON HCL 4 MG/2ML IJ SOLN: 4.0000 mg | Freq: Four times a day (QID) | INTRAMUSCULAR | Status: DC | PRN

## 2019-11-15 MED ORDER — COLCHICINE 0.6 MG PO TABS: 0.6000 mg | ORAL_TABLET | Freq: Every day | ORAL | Status: DC | PRN

## 2019-11-15 MED ORDER — FENOFIBRATE 160 MG PO TABS
160.0000 mg | ORAL_TABLET | Freq: Every evening | ORAL | Status: DC
  Administered 2019-11-16 – 2019-11-19 (×4): 160 mg via ORAL
  Filled 2019-11-15 (×4): qty 1

## 2019-11-15 MED ORDER — OXYCODONE HCL 5 MG PO TABS
5.0000 mg | ORAL_TABLET | ORAL | Status: DC | PRN
  Administered 2019-11-16: 5 mg via ORAL
  Filled 2019-11-15: qty 1

## 2019-11-15 MED ORDER — METOPROLOL TARTRATE 25 MG PO TABS
25.0000 mg | ORAL_TABLET | Freq: Two times a day (BID) | ORAL | Status: DC
  Administered 2019-11-16: 25 mg via ORAL
  Filled 2019-11-15: qty 1

## 2019-11-15 MED ORDER — PIPERACILLIN-TAZOBACTAM 3.375 G IVPB
3.3750 g | Freq: Three times a day (TID) | INTRAVENOUS | Status: DC
  Administered 2019-11-16 – 2019-11-19 (×10): 3.375 g via INTRAVENOUS
  Filled 2019-11-15 (×10): qty 50

## 2019-11-15 MED ORDER — SODIUM CHLORIDE 0.9 % IV BOLUS
1000.0000 mL | Freq: Once | INTRAVENOUS | Status: AC
  Administered 2019-11-15: 1000 mL via INTRAVENOUS

## 2019-11-15 MED ORDER — IRBESARTAN 300 MG PO TABS
300.0000 mg | ORAL_TABLET | Freq: Every day | ORAL | Status: DC
  Administered 2019-11-16 – 2019-11-17 (×2): 300 mg via ORAL
  Filled 2019-11-15 (×2): qty 1

## 2019-11-15 MED ORDER — SODIUM CHLORIDE 0.9 % IV BOLUS (SEPSIS)
1000.0000 mL | Freq: Once | INTRAVENOUS | Status: AC
  Administered 2019-11-15: 1000 mL via INTRAVENOUS

## 2019-11-15 MED ORDER — PANTOPRAZOLE SODIUM 40 MG PO TBEC
40.0000 mg | DELAYED_RELEASE_TABLET | Freq: Every day | ORAL | Status: DC
  Administered 2019-11-16: 40 mg via ORAL
  Filled 2019-11-15: qty 1

## 2019-11-15 MED ORDER — PIPERACILLIN-TAZOBACTAM 3.375 G IVPB 30 MIN: 3.3750 g | Freq: Three times a day (TID) | INTRAVENOUS | Status: DC

## 2019-11-15 MED ORDER — ALLOPURINOL 300 MG PO TABS
300.0000 mg | ORAL_TABLET | Freq: Every day | ORAL | Status: DC
  Administered 2019-11-16 – 2019-11-20 (×5): 300 mg via ORAL
  Filled 2019-11-15 (×5): qty 1

## 2019-11-15 MED ORDER — IOHEXOL 300 MG/ML  SOLN
100.0000 mL | Freq: Once | INTRAMUSCULAR | Status: AC | PRN
  Administered 2019-11-15: 100 mL via INTRAVENOUS

## 2019-11-15 MED ORDER — SODIUM CHLORIDE 0.9% FLUSH
3.0000 mL | INTRAVENOUS | Status: DC | PRN
  Administered 2019-11-16: 3 mL via INTRAVENOUS

## 2019-11-15 MED ORDER — ASPIRIN EC 325 MG PO TBEC
325.0000 mg | DELAYED_RELEASE_TABLET | Freq: Every day | ORAL | Status: DC
  Administered 2019-11-16 – 2019-11-20 (×4): 325 mg via ORAL
  Filled 2019-11-15 (×5): qty 1

## 2019-11-15 MED ORDER — DEXAMETHASONE SODIUM PHOSPHATE 10 MG/ML IJ SOLN
10.0000 mg | Freq: Once | INTRAMUSCULAR | Status: AC
  Administered 2019-11-15: 10 mg via INTRAVENOUS
  Filled 2019-11-15: qty 1

## 2019-11-15 MED ORDER — FINASTERIDE 5 MG PO TABS
5.0000 mg | ORAL_TABLET | Freq: Every day | ORAL | Status: DC
  Administered 2019-11-16 – 2019-11-19 (×4): 5 mg via ORAL
  Filled 2019-11-15 (×4): qty 1

## 2019-11-15 MED ORDER — SODIUM CHLORIDE 0.9 % IV SOLN: 250.0000 mL | INTRAVENOUS | Status: DC | PRN

## 2019-11-15 MED ORDER — ACETAMINOPHEN 325 MG PO TABS
650.0000 mg | ORAL_TABLET | Freq: Four times a day (QID) | ORAL | Status: DC | PRN
  Administered 2019-11-18: 650 mg via ORAL
  Filled 2019-11-15: qty 2

## 2019-11-15 MED ORDER — SODIUM CHLORIDE 0.9 % IV SOLN
200.0000 mg | Freq: Once | INTRAVENOUS | Status: AC
  Administered 2019-11-15: 200 mg via INTRAVENOUS
  Filled 2019-11-15: qty 40

## 2019-11-15 MED ORDER — MORPHINE SULFATE (PF) 4 MG/ML IV SOLN
4.0000 mg | Freq: Once | INTRAVENOUS | Status: DC
  Filled 2019-11-15: qty 1

## 2019-11-15 MED ORDER — BICALUTAMIDE 50 MG PO TABS
50.0000 mg | ORAL_TABLET | Freq: Every day | ORAL | Status: DC
  Administered 2019-11-16 – 2019-11-19 (×4): 50 mg via ORAL
  Filled 2019-11-15 (×7): qty 1

## 2019-11-15 MED ORDER — SODIUM CHLORIDE 0.9% FLUSH
3.0000 mL | Freq: Two times a day (BID) | INTRAVENOUS | Status: DC
  Administered 2019-11-16: 3 mL via INTRAVENOUS

## 2019-11-15 MED ORDER — SODIUM CHLORIDE 0.9 % IV SOLN
500.0000 mg | Freq: Once | INTRAVENOUS | Status: AC
  Administered 2019-11-15: 500 mg via INTRAVENOUS
  Filled 2019-11-15: qty 500

## 2019-11-15 MED ORDER — ENOXAPARIN SODIUM 40 MG/0.4ML ~~LOC~~ SOLN
40.0000 mg | SUBCUTANEOUS | Status: DC
  Administered 2019-11-16 – 2019-11-17 (×2): 40 mg via SUBCUTANEOUS
  Filled 2019-11-15 (×2): qty 0.4

## 2019-11-15 MED ORDER — METFORMIN HCL 500 MG PO TABS: 500.0000 mg | ORAL_TABLET | Freq: Two times a day (BID) | ORAL | Status: DC

## 2019-11-15 MED ORDER — INSULIN ASPART 100 UNIT/ML ~~LOC~~ SOLN
0.0000 [IU] | Freq: Three times a day (TID) | SUBCUTANEOUS | Status: DC
  Administered 2019-11-16: 3 [IU] via SUBCUTANEOUS
  Administered 2019-11-16 – 2019-11-18 (×3): 4 [IU] via SUBCUTANEOUS
  Administered 2019-11-18: 3 [IU] via SUBCUTANEOUS
  Administered 2019-11-19: 7 [IU] via SUBCUTANEOUS
  Administered 2019-11-19: 3 [IU] via SUBCUTANEOUS
  Administered 2019-11-19: 4 [IU] via SUBCUTANEOUS
  Administered 2019-11-20: 3 [IU] via SUBCUTANEOUS
  Administered 2019-11-20: 7 [IU] via SUBCUTANEOUS

## 2019-11-15 MED ORDER — SODIUM CHLORIDE 0.9 % IV SOLN
100.0000 mg | Freq: Every day | INTRAVENOUS | Status: AC
  Administered 2019-11-16 – 2019-11-19 (×4): 100 mg via INTRAVENOUS
  Filled 2019-11-15 (×5): qty 20

## 2019-11-15 MED ORDER — ONDANSETRON HCL 4 MG/2ML IJ SOLN
4.0000 mg | Freq: Once | INTRAMUSCULAR | Status: AC
  Administered 2019-11-15: 4 mg via INTRAVENOUS
  Filled 2019-11-15: qty 2

## 2019-11-15 MED ORDER — PIPERACILLIN-TAZOBACTAM 3.375 G IVPB 30 MIN
3.3750 g | Freq: Once | INTRAVENOUS | Status: AC
  Administered 2019-11-15: 3.375 g via INTRAVENOUS
  Filled 2019-11-15: qty 50
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
## 2019-11-15 NOTE — ED Provider Notes (Signed)
Medical screening examination/treatment/procedure(s) were conducted as a shared visit with non-physician practitioner(s) and myself.  I personally evaluated the patient during the encounter. Briefly, the patient is a 79 y.o. male with history of hypertension, AAA status post repair, prostate cancer, CAD who presents to the ED with weakness, fever.  Patient with fever of 101.6.  Blood pressure 90/53 but no tachycardia.  Denies any cough or sputum production.  Not vaccinated against coronavirus.  Thinks that he might have food poisoning.  Has had nausea and vomiting and epigastric abdominal pain.  Some tenderness in this area as well.  Will initiate sepsis work-up.  Will give IV Zosyn given GI symptoms and abdominal pain.  Will get Covid testing.  Covid testing is positive. Lactic acid 4.5. Liver enzyme mildly elevated. Full 30 cc/kg IV fluids were given. Broad-spectrum antibiotics given. CT scan of the abdomen pelvis concerning for possible cholecystitis. Right upper quadrant ultrasound ordered and overall equivocal. Will need HIDA scan. Chest x-ray also with 2.6 x 2.7 cavitary nodule. Differential is wide. Overall patient with Covid and also likely gallbladder infection as well. Broad-spectrum IV antibiotics have been given. HIDA scan to be ordered. Lipase is normal. Blood pressure is stabilized. Lactic acid improving to 2.4. To be admitted to medicine for further care.  This chart was dictated using voice recognition software.  Despite best efforts to proofread,  errors can occur which can change the documentation meaning.     EKG Interpretation  Date/Time:  Sunday November 15 2019 16:37:41 EDT Ventricular Rate:  103 PR Interval:    QRS Duration: 96 QT Interval:  362 QTC Calculation: 474 R Axis:     Text Interpretation: Sinus tachycardia Borderline prolonged PR interval Nonspecific repol abnormality, diffuse leads Confirmed by Lennice Sites [id number]) on 11/15/2019 4:39:28 PM           Lennice Sites, DO 11/15/19 [id number]

## 2019-11-15 NOTE — ED Triage Notes (Signed)
Pt arrives from home via EMS with complaints of weakness, n/v. Pt feels he may have food poisoning. Pt reports this am his legs felt weak and were cramping with increased feeling of fatigue throughout the day.   101.1 90% RA 103/60 CBG 268 1000 mL NS 1g tylenol

## 2019-11-15 NOTE — ED Provider Notes (Signed)
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 cover for atypicals with possible source of infection being pulmonary at this time.   CBC without leukocytosis. Hgb stable 9.8.  CMP with stable creatinine 1.47. AST/ALT elevated at 274/104. T bili elevated at 1.9. RUQ ultrasound has been ordered with concern for possible cholecystitis.   COVID test has returned positive. Suspect pts symptoms are related to this. Will add on additional COVID labs at this time. Will start on remdesivir and decadron.   Lactic acid elevated at 4.5. Pt receiving 30 CC/kg fluid bolus. His blood pressures continue to be soft.   CT scan with multiple findings:  IMPRESSION:  1. A 2.6 x 2.7 cm cavitary nodule in the superior segment of the  right lower lobe may represent an abscess, an infected pneumatocele.  Fungal infection, TB or cavitary neoplasm are not excluded. Clinical  correlation and follow-up to resolution is recommended.  2. Emphysema with slight  progression of the interstitial lung  disease since the prior CT.  3. Fatty liver with  possible early changes of cirrhosis.  Elastography may provide better evaluation.  4. Gallbladder tumefactive sludge versus noncalcified stones.  Diffuse gallbladder wall thickening and small pericholecystic fluid  may be related to underlying liver disease although acute  cholecystitis is not excluded. Further evaluation with right upper  quadrant ultrasound recommended.  5. Severe sigmoid diverticulosis. No bowel obstruction. Normal  appendix.  6. Aortic Atherosclerosis (ICD10-I70.0) and Emphysema (ICD10-J43.9).     RUQ ultrasound ordered at this time to further assess gallbladder; LFTs are elevated; difficult to say whether related to COVID infection vs cholecystitis. Pt does have diffuse abd TTP however reports worsening pain in his epigastric region   RUQ ultrasound  IMPRESSION:  1. Gallbladder sludge and stones with diffuse edema as seen on the  earlier CT. A hepatobiliary scintigraphy may provide better  evaluation of the gallbladder if there is a high clinical concern  for acute cholecystitis .  2. Fatty liver.   Discussed case with Dr. Linda Hedges with [facility] who will evaluate patient for admission.   This note was prepared using Dragon voice recognition software and may include unintentional dictation errors due to the inherent limitations of voice recognition software.  Final Clinical Impression(s) / ED Diagnoses Final diagnoses:  RUQ abdominal pain  COVID-19  Elevated LFTs  Gallbladder sludge  Pulmonary cavitary lesion    Rx / DC Orders ED Discharge Orders    None       Eustaquio Maize, PA-C 11/15/19 [id number]    Lennice Sites, DO 11/15/19 2219

## 2019-11-16 ENCOUNTER — Encounter (HOSPITAL_COMMUNITY): Payer: Self-pay | Admitting: Internal Medicine

## 2019-11-16 ENCOUNTER — Inpatient Hospital Stay (HOSPITAL_COMMUNITY): Payer: Medicare Other

## 2019-11-16 DIAGNOSIS — I2510 Atherosclerotic heart disease of native coronary artery without angina pectoris: Secondary | ICD-10-CM

## 2019-11-16 DIAGNOSIS — R7989 Other specified abnormal findings of blood chemistry: Secondary | ICD-10-CM

## 2019-11-16 DIAGNOSIS — U071 COVID-19: Secondary | ICD-10-CM | POA: Diagnosis not present

## 2019-11-16 LAB — URINE CULTURE: Culture: NO GROWTH

## 2019-11-16 LAB — BASIC METABOLIC PANEL
Anion gap: 7 (ref 5–15)
BUN: 17 mg/dL (ref 8–23)
CO2: 24 mmol/L (ref 22–32)
Calcium: 8.2 mg/dL — ABNORMAL LOW (ref 8.9–10.3)
Chloride: 107 mmol/L (ref 98–111)
Creatinine, Ser: 1.36 mg/dL — ABNORMAL HIGH (ref 0.61–1.24)
GFR calc Af Amer: 58 mL/min — ABNORMAL LOW (ref >60)
GFR calc non Af Amer: 50 mL/min — ABNORMAL LOW (ref >60)
Glucose, Bld: 187 mg/dL — ABNORMAL HIGH (ref 70–99)
Potassium: 4.2 mmol/L (ref 3.5–5.1)
Sodium: 138 mmol/L (ref 135–145)

## 2019-11-16 LAB — BLOOD CULTURE ID PANEL (REFLEXED)

## 2019-11-16 LAB — HEPATITIS PANEL, ACUTE
HCV Ab: NONREACTIVE
Hep A IgM: NONREACTIVE
Hep B C IgM: NONREACTIVE
Hepatitis B Surface Ag: NONREACTIVE

## 2019-11-16 LAB — BRAIN NATRIURETIC PEPTIDE: B Natriuretic Peptide: 636.5 pg/mL — ABNORMAL HIGH (ref 0.0–100.0)

## 2019-11-16 LAB — ECHOCARDIOGRAM LIMITED
AR max vel: 1.11 cm2
AV Area VTI: 1.26 cm2
AV Area mean vel: 1.14 cm2
AV Mean grad: 9.9 mmHg
AV Peak grad: 18.4 mmHg
Ao pk vel: 2.15 m/s
Height: 70 in
Single Plane A4C EF: 53.3 %
Weight: 3005.31 oz

## 2019-11-16 LAB — CBC
HCT: 28 % — ABNORMAL LOW (ref 39.0–52.0)
Hemoglobin: 8.6 g/dL — ABNORMAL LOW (ref 13.0–17.0)
MCH: 30.5 pg (ref 26.0–34.0)
MCHC: 30.7 g/dL (ref 30.0–36.0)
MCV: 99.3 fL (ref 80.0–100.0)
Platelets: 242 10*3/uL (ref 150–400)
RBC: 2.82 MIL/uL — ABNORMAL LOW (ref 4.22–5.81)
RDW: 14.6 % (ref 11.5–15.5)
WBC: 8.1 10*3/uL (ref 4.0–10.5)
nRBC: 0 % (ref 0.0–0.2)

## 2019-11-16 LAB — C-REACTIVE PROTEIN: CRP: 10.2 mg/dL — ABNORMAL HIGH (ref <1.0)

## 2019-11-16 LAB — GLUCOSE, CAPILLARY
Glucose-Capillary: 151 mg/dL — ABNORMAL HIGH (ref 70–99)
Glucose-Capillary: 138 mg/dL — ABNORMAL HIGH (ref 70–99)
Glucose-Capillary: 83 mg/dL (ref 70–99)
Glucose-Capillary: 136 mg/dL — ABNORMAL HIGH (ref 70–99)

## 2019-11-16 LAB — D-DIMER, QUANTITATIVE: D-Dimer, Quant: 2.9 ug/mL-FEU — ABNORMAL HIGH (ref 0.00–0.50)

## 2019-11-16 LAB — MAGNESIUM: Magnesium: 1.8 mg/dL (ref 1.7–2.4)

## 2019-11-16 LAB — HEMOGLOBIN A1C
Hgb A1c MFr Bld: 7.5 % — ABNORMAL HIGH (ref 4.8–5.6)
Mean Plasma Glucose: 168.55 mg/dL

## 2019-11-16 LAB — MRSA PCR SCREENING: MRSA by PCR: NEGATIVE

## 2019-11-16 LAB — PROCALCITONIN: Procalcitonin: 43.37 ng/mL

## 2019-11-16 MED ORDER — SODIUM CHLORIDE 0.9 % IV SOLN
250.0000 mL | INTRAVENOUS | Status: DC | PRN
  Administered 2019-11-16: 250 mL via INTRAVENOUS

## 2019-11-16 MED ORDER — CHLORHEXIDINE GLUCONATE CLOTH 2 % EX PADS
6.0000 | MEDICATED_PAD | Freq: Every day | CUTANEOUS | Status: DC
  Administered 2019-11-16 – 2019-11-20 (×5): 6 via TOPICAL

## 2019-11-16 MED ORDER — SODIUM CHLORIDE 0.9 % IV SOLN
500.0000 mg | INTRAVENOUS | Status: DC
  Filled 2019-11-16: qty 500

## 2019-11-16 MED ORDER — OXYCODONE-ACETAMINOPHEN 5-325 MG PO TABS
1.0000 | ORAL_TABLET | Freq: Three times a day (TID) | ORAL | Status: DC | PRN
  Administered 2019-11-16 – 2019-11-19 (×7): 1 via ORAL
  Filled 2019-11-16 (×8): qty 1

## 2019-11-16 MED ORDER — PANTOPRAZOLE SODIUM 40 MG IV SOLR
40.0000 mg | Freq: Two times a day (BID) | INTRAVENOUS | Status: DC
  Administered 2019-11-16 – 2019-11-17 (×3): 40 mg via INTRAVENOUS
  Filled 2019-11-16 (×3): qty 40

## 2019-11-16 MED ORDER — NICOTINE 21 MG/24HR TD PT24
21.0000 mg | MEDICATED_PATCH | Freq: Every day | TRANSDERMAL | Status: DC
  Administered 2019-11-16 – 2019-11-20 (×5): 21 mg via TRANSDERMAL
  Filled 2019-11-16 (×5): qty 1

## 2019-11-16 MED ORDER — MORPHINE SULFATE (PF) 2 MG/ML IV SOLN: 1.0000 mg | Freq: Once | INTRAVENOUS | Status: DC | PRN

## 2019-11-16 MED ORDER — LACTATED RINGERS IV SOLN: INTRAVENOUS | Status: DC

## 2019-11-16 MED ORDER — SODIUM CHLORIDE 0.9 % IV SOLN
500.0000 mg | INTRAVENOUS | Status: DC
  Administered 2019-11-16 – 2019-11-19 (×4): 500 mg via INTRAVENOUS
  Filled 2019-11-16 (×4): qty 500

## 2019-11-16 NOTE — Progress Notes (Addendum)
PROGRESS NOTE                                                                                                                                                                                                             Patient Demographics:    Cody Moss, is a 79 y.o. male, DOB - April 18, 1941, YEM:[id number]  Outpatient Primary MD for the patient is [name], MD    LOS - 1  Admit date - 11/15/2019    Chief Complaint  Patient presents with  . Weakness       Brief Narrative  Cody Moss is a 79 y.o. male with medical history significant of HTN, DM on oral meds, h/o Lumbar spine surgery, AAA repair, pulmonary fibrosis, tobacco abuse, CAD s/p PCI/Stent. He reports that about 1 week ago he had an episode of abdominal pain located left abdomen that resolved spontaneously. On the day of admission he awoke with moderate to severe abdominal pain, located primarily at Left upper quadrant and left flank accompanied by N/V.  His work-up in the ER showed a incidental COVID-19 infection, cavitary lung lesion with evidence of bacterial infection and elevated procalcitonin, possible calculus cholecystitis.   Subjective:    Cody Moss today has, No headache, No chest pain, No abdominal pain - No Nausea, No new weakness tingling or numbness, +ve productive Cough - no SOB    Assessment  & Plan :     1. Acute Hypoxic Resp. Failure due to bacterial pneumonia with cavitary lung lesion along with incidental acute COVID-19 viral infection.  - at this time his predominant problem seems to be the bacterial component of the infection, his procalcitonin is extremely high and so is his CRP, he has been started on appropriate antibiotics, will check HIV, gold QuantiFERON for TB along with sputum Gram stain culture and blood cultures.  Pulmonary will be requested to evaluate the patient.  He does have history of extensive foreign travel  however denies any history suggestive of aspiration however will have speech evaluate, does not drink alcohol but does smoke. He follows with Dr Melvyn Novas.  For his COVID-19 infection for now low-dose steroid and remdesivir will be continued.  I think this is not causing him any symptoms at this time.  Encouraged the patient to sit up in chair in the daytime use I-S and  flutter valve for pulmonary toiletry and then prone in bed when at night.  Will advance activity and titrate down oxygen as possible.   SpO2: 96 % O2 Flow Rate (L/min): 2 L/min  Recent Labs  Lab 11/15/19 1655 11/15/19 1709 11/15/19 2106 11/16/19 0758  WBC  --  7.5  --  8.1  PLT  --  232  --  242  CRP  --   --  4.7* 10.2*  DDIMER  --   --  2.87* 2.90*  PROCALCITON  --   --  38.17 43.37  LATICACIDVEN  --  4.5* 2.4*  --   SARSCOV2NAA POSITIVE*  --   --   --     Hepatic Function Latest Ref Rng & Units 11/15/2019 07/23/2015 03/12/2014  Total Protein 6.5 - 8.1 g/dL 5.6(L) 7.5 6.8  Albumin 3.5 - 5.0 g/dL 2.9(L) 3.4(L) 3.4(L)  AST 15 - 41 U/L 274(H) 20 8  ALT 0 - 44 U/L 104(H) 15(L) 6  Alk Phosphatase 38 - 126 U/L 80 28(L) 45  Total Bilirubin 0.3 - 1.2 mg/dL 1.9(H) 0.8 0.4  Bilirubin, Direct 0.1 - 0.5 mg/dL - 0.1 -    2.  Abdominal pain along with nausea vomiting.  With ultrasound and CT scan suggestive of calculus cholecystitis, clear liquids, he is currently pain and nausea free, general surgery has been consulted, his LFTs however have shot up today which could be due to Covid infection and Remdesivir however will evaluate MRCP to rule out a CBD stone.  3.  History of smoking.  NicoDerm patch.  Counseled to quit smoking.  4.  Tonic low back pain.  Home dose narcotic.  5.  BPH.  On Proscar.  6.  GERD.  On PPI.  7. E. coli bacteremia.  Most likely source could be gallbladder however cavitary lung lesion there as well, continue Zosyn.  8.  History of essential hypertension.  Currently blood pressure soft.  Hydrate and  monitor.   Condition - Extremely Guarded  Family Communication  :   Wife Cody Moss [phone] on 11/16/19, daughter Cody Moss -  [phone] on 11/16/19   Code Status :  Full  Consults  :  PCCM, CCS  Procedures  :    RUQ [place] - 1. Gallbladder sludge and stones with diffuse edema as seen on the earlier CT. A hepatobiliary scintigraphy may provide better evaluation of the gallbladder if there is a high clinical concern for acute cholecystitis . 2. Fatty liver.   CT - 1. A 2.6 x 2.7 cm cavitary nodule in the superior segment of the right lower lobe may represent an abscess, an infected pneumatocele. Fungal infection, TB or cavitary neoplasm are not excluded. Clinical correlation and follow-up to resolution is recommended. 2. Emphysema with slight progression of the interstitial lung disease since the prior CT. 3. Fatty liver with possible early changes of cirrhosis. Elastography may provide better evaluation. 4. Gallbladder tumefactive sludge versus noncalcified stones. Diffuse gallbladder wall thickening and small pericholecystic fluid may be related to underlying liver disease although acute cholecystitis is not excluded. Further evaluation with right upper quadrant ultrasound recommended. 5. Severe sigmoid.   PUD Prophylaxis : PPI  Disposition Plan  :    Status is: Inpatient  Remains inpatient appropriate because:IV treatments appropriate due to intensity of illness or inability to take PO   Dispo: The patient is from: Home              Anticipated d/c is to: Home  Anticipated d/c date is: > 3 days              Patient currently is not medically stable to d/c.   DVT Prophylaxis  :  Lovenox   Lab Results  Component Value Date   PLT 242 11/16/2019    Diet :  Diet Order            DIET SOFT Room service appropriate? Yes; Fluid consistency: Thin  Diet effective now                  Inpatient Medications  Scheduled Meds: . allopurinol  300 mg Oral Daily  .  aspirin  325 mg Oral Daily  . bicalutamide  50 mg Oral QHS  . Chlorhexidine Gluconate Cloth  6 each Topical Daily  . dexamethasone  6 mg Oral Q24H  . enoxaparin (LOVENOX) injection  40 mg Subcutaneous Q24H  . fenofibrate  160 mg Oral QPM  . finasteride  5 mg Oral QHS  . insulin aspart  0-20 Units Subcutaneous TID WC  . irbesartan  300 mg Oral Daily  . metoprolol tartrate  25 mg Oral BID  . nicotine  21 mg Transdermal Daily  . pantoprazole  40 mg Oral Daily   Continuous Infusions: . azithromycin    . piperacillin-tazobactam (ZOSYN)  IV 3.375 g (11/16/19 1014)  . remdesivir 100 mg in NS 100 mL 100 mg (11/16/19 1016)   PRN Meds:.acetaminophen, colchicine, ondansetron (ZOFRAN) IV, oxyCODONE-acetaminophen  Antibiotics  :    Anti-infectives (From admission, onward)   Start     Dose/Rate Route Frequency Ordered Stop   11/16/19 2000  azithromycin (ZITHROMAX) 500 mg in sodium chloride 0.9 % 250 mL IVPB  Status:  Discontinued        500 mg 250 mL/hr over 60 Minutes Intravenous Every 24 hours 11/16/19 0028 11/16/19 0923   11/16/19 1230  azithromycin (ZITHROMAX) 500 mg in sodium chloride 0.9 % 250 mL IVPB     Discontinue     500 mg 250 mL/hr over 60 Minutes Intravenous Every 24 hours 11/16/19 1121     11/16/19 1000  remdesivir 100 mg in sodium chloride 0.9 % 100 mL IVPB     Discontinue    "Followed by" Linked Group Details   100 mg 200 mL/hr over 30 Minutes Intravenous Daily 11/15/19 1913 11/20/19 0959   11/16/19 0000  piperacillin-tazobactam (ZOSYN) IVPB 3.375 g     Discontinue     3.375 g 12.5 mL/hr over 240 Minutes Intravenous Every 8 hours 11/15/19 2342     11/15/19 2345  piperacillin-tazobactam (ZOSYN) IVPB 3.375 g  Status:  Discontinued        3.375 g 100 mL/hr over 30 Minutes Intravenous Every 8 hours 11/15/19 2340 11/15/19 2341   11/15/19 2000  remdesivir 200 mg in sodium chloride 0.9% 250 mL IVPB       "Followed by" Linked Group Details   200 mg 580 mL/hr over 30 Minutes  Intravenous Once 11/15/19 1913 11/15/19 2220   11/15/19 1730  azithromycin (ZITHROMAX) 500 mg in sodium chloride 0.9 % 250 mL IVPB        500 mg 250 mL/hr over 60 Minutes Intravenous  Once 11/15/19 1723 11/15/19 1915   11/15/19 1700  piperacillin-tazobactam (ZOSYN) IVPB 3.375 g        3.375 g 100 mL/hr over 30 Minutes Intravenous  Once 11/15/19 1650 11/15/19 1739       Time Spent in minutes  30  Lala Lund M.D on 11/16/2019 at 11:24 AM  To page go to [email or web address] - password [email or web address]  [facility] -  Office  [phone]    See all Orders from today for further details    Objective:   Vitals:   11/16/19 0118 11/16/19 0217 11/16/19 0350 11/16/19 0802  BP: 105/68 104/65 (!) 107/58 (!) 103/63  Pulse: 72 64 63 53  Resp: _0 (!) 24  Temp: 97.7 F (36.5 C) 98.7 F (37.1 C) 98 F (36.7 C) 98.2 F (36.8 C)  TempSrc: Oral Oral Axillary Oral  SpO2: 94% 95% 93% 96%  Weight:  85.4 kg 85.2 kg   Height:  _1  (1.778 m)      Wt Readings from Last 3 Encounters:  11/16/19 85.2 kg  08/12/19 87.5 kg  05/11/19 87.1 kg     Intake/Output Summary (Last 24 hours) at 11/16/2019 1124 Last data filed at 11/16/2019 1003 Gross per 24 hour  Intake 2988.18 ml  Output 450 ml  Net 2538.18 ml     Physical Exam  Awake Alert, No new F.N deficits, Normal affect [place].AT,PERRAL Supple Neck,No JVD, No cervical lymphadenopathy appriciated.  Symmetrical Chest wall movement, Good air movement bilaterally, CTAB RRR,No Gallops,Rubs or new Murmurs, No Parasternal Heave +ve B.Sounds, Abd Soft, No tenderness, No organomegaly appriciated, No rebound - guarding or rigidity. No Cyanosis, Clubbing or edema, No new Rash or bruise      Data Review:    CBC Recent Labs  Lab 11/15/19 1709 11/16/19 0758  WBC 7.5 8.1  HGB 9.8* 8.6*  HCT 30.3* 28.0*  PLT 232 242  MCV 99.3 99.3  MCH 32.1 30.5  MCHC 32.3 30.7  RDW 14.3 14.6  LYMPHSABS 0.2*  --   MONOABS 0.5  --   EOSABS 0.0  --     BASOSABS 0.0  --     Chemistries  Recent Labs  Lab 11/15/19 1709 11/15/19 1717 11/16/19 0354 11/16/19 0758  NA 136  --  138  --   K 3.6  --  4.2  --   CL 104  --  107  --   CO2 19*  --  24  --   GLUCOSE 229*  --  187*  --   BUN 21  --  17  --   CREATININE 1.47*  --  1.36*  --   CALCIUM 8.3*  --  8.2*  --   AST 274*  --   --   --   ALT 104*  --   --   --   ALKPHOS 80  --   --   --   BILITOT 1.9*  --   --   --   MG  --   --   --  1.8  INR 1.1  --   --   --   HGBA1C  --  7.5*  --   --      ------------------------------------------------------------------------------------------------------------------ Recent Labs    11/15/19 2106  TRIG 199*    Lab Results  Component Value Date   HGBA1C 7.5 (H) 11/15/2019   ------------------------------------------------------------------------------------------------------------------ No results for input(s): TSH, T4TOTAL, T3FREE, THYROIDAB in the last 72 hours.  Invalid input(s): FREET3  Cardiac Enzymes No results for input(s): CKMB, TROPONINI, MYOGLOBIN in the last 168 hours.  Invalid input(s): CK ------------------------------------------------------------------------------------------------------------------    Component Value Date/Time   BNP 636.5 (H) 11/16/2019 7062    Micro Results Recent Results (from the past 240 hour(s))  SARS Coronavirus 2 by  RT PCR (hospital order, performed in [facility] hospital lab) Nasopharyngeal Nasopharyngeal Swab     Status: Abnormal   Collection Time: 11/15/19  4:55 PM   Specimen: Nasopharyngeal Swab  Result Value Ref Range Status   SARS Coronavirus 2 POSITIVE (A) NEGATIVE Final    Comment: RESULT CALLED TO, READ BACK BY AND VERIFIED WITH: RN MEEKS [id number] FCP (NOTE) SARS-CoV-2 target nucleic acids are DETECTED  SARS-CoV-2 RNA is generally detectable in upper respiratory specimens  during the acute phase of infection.  Positive results are indicative  of the presence of the  identified virus, but do not rule out bacterial infection or co-infection with other pathogens not detected by the test.  Clinical correlation with patient history and  other diagnostic information is necessary to determine patient infection status.  The expected result is negative.  Fact Sheet for Patients:   StrictlyIdeas.no   Fact Sheet for Healthcare Providers:   BankingDealers.co.za    This test is not yet approved or cleared by the [place] FDA and  has been authorized for detection and/or diagnosis of SARS-CoV-2 by FDA under an Emergency Use Authorization (EUA).  This EUA will remain in effect (meaning this test can be  used) for the duration of  the COVID-19 declaration under Section 564(b)(1) of the Act, 21 U.S.C. section 360-bbb-3(b)(1), unless the authorization is terminated or revoked sooner.  Performed at [facility], [place] [address] [place], [place] [address]   Blood Culture (routine x 2)     Status: None (Preliminary result)   Collection Time: 11/15/19  5:00 PM   Specimen: BLOOD RIGHT WRIST  Result Value Ref Range Status   Specimen Description BLOOD RIGHT WRIST  Final   Special Requests   Final    BOTTLES DRAWN AEROBIC AND ANAEROBIC Blood Culture adequate volume   Culture   Final    NO GROWTH < 24 HOURS Performed at [facility], [place] [address] [place], [place] [address]    Report Status PENDING  Incomplete  Blood Culture (routine x 2)     Status: None (Preliminary result)   Collection Time: 11/15/19  5:09 PM   Specimen: BLOOD  Result Value Ref Range Status   Specimen Description BLOOD RIGHT ANTECUBITAL  Final   Special Requests   Final    BOTTLES DRAWN AEROBIC AND ANAEROBIC Blood Culture results may not be optimal due to an excessive volume of blood received in culture bottles   Culture  Setup Time   Final    GRAM NEGATIVE RODS ANAEROBIC BOTTLE ONLY Organism ID to follow CRITICAL RESULT  CALLED TO, READ BACK BY AND VERIFIED WITH: Sarajane Marek [id number] [phone] MLM Performed at [facility], [place] [address], [place],  [address]    Culture GRAM NEGATIVE RODS  Final   Report Status PENDING  Incomplete  Blood Culture ID Panel (Reflexed)     Status: Abnormal   Collection Time: 11/15/19  5:09 PM  Result Value Ref Range Status   Enterococcus species NOT DETECTED NOT DETECTED Final   Listeria monocytogenes NOT DETECTED NOT DETECTED Final   Staphylococcus species NOT DETECTED NOT DETECTED Final   Staphylococcus aureus (BCID) NOT DETECTED NOT DETECTED Final   Streptococcus species NOT DETECTED NOT DETECTED Final   Streptococcus agalactiae NOT DETECTED NOT DETECTED Final   Streptococcus pneumoniae NOT DETECTED NOT DETECTED Final   Streptococcus pyogenes NOT DETECTED NOT DETECTED Final   Acinetobacter baumannii NOT DETECTED NOT DETECTED Final  Enterobacteriaceae species DETECTED (A) NOT DETECTED Final    Comment: Enterobacteriaceae represent a large family of gram-negative bacteria, not a single organism. CRITICAL RESULT CALLED TO, READ BACK BY AND VERIFIED WITH: PHARMD J FRENS [id number] MLM    Enterobacter cloacae complex NOT DETECTED NOT DETECTED Final   Escherichia coli DETECTED (A) NOT DETECTED Final    Comment: CRITICAL RESULT CALLED TO, READ BACK BY AND VERIFIED WITH: PHARMD J FRENS [id number] MLM    Klebsiella oxytoca NOT DETECTED NOT DETECTED Final   Klebsiella pneumoniae NOT DETECTED NOT DETECTED Final   Proteus species NOT DETECTED NOT DETECTED Final   Serratia marcescens NOT DETECTED NOT DETECTED Final   Carbapenem resistance NOT DETECTED NOT DETECTED Final   Haemophilus influenzae NOT DETECTED NOT DETECTED Final   Neisseria meningitidis NOT DETECTED NOT DETECTED Final   Pseudomonas aeruginosa NOT DETECTED NOT DETECTED Final   Candida albicans NOT DETECTED NOT DETECTED Final   Candida glabrata NOT DETECTED NOT DETECTED Final   Candida krusei NOT  DETECTED NOT DETECTED Final   Candida parapsilosis NOT DETECTED NOT DETECTED Final   Candida tropicalis NOT DETECTED NOT DETECTED Final    Comment: Performed at [facility], [place]. [address] [place], [place] [address]  MRSA PCR Screening     Status: None   Collection Time: 11/16/19  2:30 AM   Specimen: Nasal Mucosa; Nasopharyngeal  Result Value Ref Range Status   MRSA by PCR NEGATIVE NEGATIVE Final    Comment:        The GeneXpert MRSA Assay (FDA approved for NASAL specimens only), is one component of a comprehensive MRSA colonization surveillance program. It is not intended to diagnose MRSA infection nor to guide or monitor treatment for MRSA infections. Performed at [facility], [place] [address], [place], [place] [address]     Radiology Reports CT Chest W Contrast  Result Date: 11/15/2019 CLINICAL DATA:  79 year old male with pneumonia. Concern for abscess or effusion. EXAM: CT CHEST, ABDOMEN, AND PELVIS WITH CONTRAST TECHNIQUE: Multidetector CT imaging of the chest, abdomen and pelvis was performed following the standard protocol during bolus administration of intravenous contrast. CONTRAST:  140m OMNIPAQUE IOHEXOL 300 MG/ML  SOLN COMPARISON:  Chest CT dated 02/14/2018. Chest radiograph dated 11/15/2019. FINDINGS: CT CHEST FINDINGS Cardiovascular: There is no cardiomegaly or pericardial effusion. Advanced 3 vessel coronary vascular calcification. There is advanced calcified and noncalcified plaque of the thoracic aorta. No aneurysmal dilatation or dissection. The central pulmonary arteries appear patent. Mediastinum/Nodes: Right hilar adenopathy measuring 13 mm. Subcarinal lymph node measures 10 mm in short axis. The esophagus and the thyroid gland are grossly unremarkable. No mediastinal fluid collection. Lungs/Pleura: There is background of emphysema. Bibasilar subpleural reticulation as seen on the prior CT in keeping with interstitial lung disease. Overall slight  progression of the fibrotic changes since the prior CT. There is a 2.6 x 2.7 cm cavitary nodule in the superior segment of the right lower lobe which may represent an abscess, an infected pneumatocele. Fungal infection, TB or cavitary neoplasm are not excluded. Clinical correlation and follow-up to resolution is recommended. Several scattered bilateral lower lobe nodular densities, present on the prior CT. No lobar consolidation, pleural effusion, or pneumothorax. The central airways are patent. Musculoskeletal: No chest wall mass or suspicious bone lesions identified. CT ABDOMEN PELVIS FINDINGS No intra-abdominal free air or free fluid. Hepatobiliary: There is fatty infiltration of the liver. Slight irregularity of the liver contour may represent early changes of cirrhosis. Elastography  may provide better evaluation. There is tumefactive sludge versus noncalcified stones within the gallbladder. There is diffuse gallbladder wall thickening and small pericholecystic fluid. Findings may be related to underlying liver disease although acute cholecystitis is not excluded. Further evaluation with right upper quadrant ultrasound recommended. Pancreas: Unremarkable. No pancreatic ductal dilatation or surrounding inflammatory changes. Spleen: Normal in size without focal abnormality. Adrenals/Urinary Tract: The adrenal glands are unremarkable. Multiple bilateral renal cysts with the largest a lobulated appearing left renal interpolar cyst measuring up to 13 cm in length. Several additional subcentimeter hypodensities are too small to characterize. There is no hydronephrosis on either side. There is symmetric enhancement and excretion of contrast by both kidneys. Mild bilateral perinephric stranding, nonspecific. Correlation with urinalysis recommended to exclude UTI. The visualized ureters and urinary bladder appear unremarkable. Stomach/Bowel: There is severe sigmoid diverticulosis without active inflammatory changes.  There is no bowel obstruction or active inflammation. The appendix is normal. Vascular/Lymphatic: There is advanced aortoiliac atherosclerotic disease. 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 The graft appears patent. There is advanced atherosclerotic calcification of the common femoral arteries bilaterally, right greater left. The IVC is unremarkable. No portal venous gas. There is no adenopathy. Reproductive: Prostatectomy. 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 Musculoskeletal: There is degenerative changes of the spine. No acute osseous pathology. IMPRESSION: 1. A 2.6 x 2.7 cm cavitary nodule in the superior segment of the right lower lobe may represent an abscess, an infected pneumatocele. Fungal infection, TB or cavitary neoplasm are not excluded. Clinical correlation and follow-up to resolution is recommended. 2. Emphysema with slight progression of the interstitial lung disease since the prior CT. 3. Fatty liver with possible early changes of cirrhosis. Elastography may provide better evaluation. 4. Gallbladder tumefactive sludge versus noncalcified stones. Diffuse gallbladder wall thickening and small pericholecystic fluid may be related to underlying liver disease although acute cholecystitis is not excluded. Further evaluation with right upper quadrant ultrasound recommended. 5. Severe sigmoid diverticulosis. No bowel obstruction. Normal appendix. 6. Aortic Atherosclerosis (ICD10-I70.0) and Emphysema (ICD10-J43.9). Electronically Signed   By: Anner Crete M.D.   On: 11/15/2019 19:11   CT Abdomen Pelvis W Contrast  Result Date: 11/15/2019 CLINICAL DATA:  79 year old male with pneumonia. Concern for abscess or effusion. EXAM: CT CHEST, ABDOMEN, AND PELVIS WITH CONTRAST TECHNIQUE: Multidetector CT imaging of the chest, abdomen and pelvis was performed following the standard protocol during bolus administration of  intravenous contrast. CONTRAST:  170m OMNIPAQUE IOHEXOL 300 MG/ML  SOLN COMPARISON:  Chest CT dated 02/14/2018. Chest radiograph dated 11/15/2019. FINDINGS: CT CHEST FINDINGS Cardiovascular: There is no cardiomegaly or pericardial effusion. Advanced 3 vessel coronary vascular calcification. There is advanced calcified and noncalcified plaque of the thoracic aorta. No aneurysmal dilatation or dissection. The central pulmonary arteries appear patent. Mediastinum/Nodes: Right hilar adenopathy measuring 13 mm. Subcarinal lymph node measures 10 mm in short axis. The esophagus and the thyroid gland are grossly unremarkable. No mediastinal fluid collection. Lungs/Pleura: There is background of emphysema. Bibasilar subpleural reticulation as seen on the prior CT in keeping with interstitial lung disease. Overall slight progression of the fibrotic changes since the prior CT. There is a 2.6 x 2.7 cm cavitary nodule in the superior segment of the right lower lobe which may represent an abscess, an infected pneumatocele. Fungal infection, TB or cavitary neoplasm are not excluded. Clinical correlation and follow-up to resolution is recommended. Several scattered bilateral lower lobe nodular densities, present  on the prior CT. No lobar consolidation, pleural effusion, or pneumothorax. The central airways are patent. Musculoskeletal: No chest wall mass or suspicious bone lesions identified. CT ABDOMEN PELVIS FINDINGS No intra-abdominal free air or free fluid. Hepatobiliary: There is fatty infiltration of the liver. Slight irregularity of the liver contour may represent early changes of cirrhosis. Elastography may provide better evaluation. There is tumefactive sludge versus noncalcified stones within the gallbladder. There is diffuse gallbladder wall thickening and small pericholecystic fluid. Findings may be related to underlying liver disease although acute cholecystitis is not excluded. Further evaluation with right upper  quadrant ultrasound recommended. Pancreas: Unremarkable. No pancreatic ductal dilatation or surrounding inflammatory changes. Spleen: Normal in size without focal abnormality. Adrenals/Urinary Tract: The adrenal glands are unremarkable. Multiple bilateral renal cysts with the largest a lobulated appearing left renal interpolar cyst measuring up to 13 cm in length. Several additional subcentimeter hypodensities are too small to characterize. There is no hydronephrosis on either side. There is symmetric enhancement and excretion of contrast by both kidneys. Mild bilateral perinephric stranding, nonspecific. Correlation with urinalysis recommended to exclude UTI. The visualized ureters and urinary bladder appear unremarkable. Stomach/Bowel: There is severe sigmoid diverticulosis without active inflammatory changes. There is no bowel obstruction or active inflammation. The appendix is normal. Vascular/Lymphatic: There is advanced aortoiliac atherosclerotic disease. 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 The graft appears patent. There is advanced atherosclerotic calcification of the common femoral arteries bilaterally, right greater left. The IVC is unremarkable. No portal venous gas. There is no adenopathy. Reproductive: Prostatectomy. 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 Musculoskeletal: There is degenerative changes of the spine. No acute osseous pathology. IMPRESSION: 1. A 2.6 x 2.7 cm cavitary nodule in the superior segment of the right lower lobe may represent an abscess, an infected pneumatocele. Fungal infection, TB or cavitary neoplasm are not excluded. Clinical correlation and follow-up to resolution is recommended. 2. Emphysema with slight progression of the interstitial lung disease since the prior CT. 3. Fatty liver with possible early changes of cirrhosis. Elastography may provide better evaluation. 4. Gallbladder  tumefactive sludge versus noncalcified stones. Diffuse gallbladder wall thickening and small pericholecystic fluid may be related to underlying liver disease although acute cholecystitis is not excluded. Further evaluation with right upper quadrant ultrasound recommended. 5. Severe sigmoid diverticulosis. No bowel obstruction. Normal appendix. 6. Aortic Atherosclerosis (ICD10-I70.0) and Emphysema (ICD10-J43.9). Electronically Signed   By: Anner Crete M.D.   On: 11/15/2019 19:11   DG Chest Port 1 View  Result Date: 11/16/2019 CLINICAL DATA:  Shortness of breath and fever EXAM: PORTABLE CHEST 1 VIEW COMPARISON:  Chest radiograph and chest CT November 15, 2019 FINDINGS: There is a degree of underlying fibrosis. The cavitary lesion in the right lower lobe seen 1 day prior is less well seen by radiography although is evident, measuring 2.5 x 2.4 cm. There is a degree of lower lobe bronchiectatic change. No new opacity evident. Heart size and pulmonary vascularity are normal. Lymph node prominence seen on CT is not appreciable by radiography. There is aortic atherosclerosis. No bone lesions appreciable. IMPRESSION: Cavitary lesion right lower lobe, much better seen on recent CT. This nodular lesion measures 2.5 x 2.4 cm on portable radiographic examination. There is an underlying degree of fibrosis in the bases. No new opacity evident. Cardiac silhouette stable. Aortic Atherosclerosis (ICD10-I70.0). Electronically Signed   By: Lowella Grip III M.D.   On: 11/16/2019 09:34  DG Chest Port 1 View  Result Date: 11/15/2019 CLINICAL DATA:  Evaluate for infection. EXAM: PORTABLE CHEST 1 VIEW COMPARISON:  07/27/2015 FINDINGS: Lungs are adequately inflated without effusion or pneumothorax. Mild increased density over the right hilum/perihilar region. Cardiomediastinal silhouette and remainder the exam is unchanged. IMPRESSION: Minimal increased density over the right hilar/perihilar region. Medial airspace process is  possible. Consider PA and lateral chest radiograph for better evaluation. Electronically Signed   By: Marin Olp M.D.   On: 11/15/2019 17:04   US Abdomen Limited RUQ  Result Date: 11/15/2019 CLINICAL DATA:  79 year old male with nausea vomiting. EXAM: ULTRASOUND ABDOMEN LIMITED RIGHT UPPER QUADRANT COMPARISON:  CT abdomen pelvis dated 11/15/2019. FINDINGS: Gallbladder: There is sludge and stone within the gallbladder. There is diffuse gallbladder wall thickening and edema and a small pericholecystic fluid. Evaluation for sonographic Murphy's sign was limited as the patient was pre-medicated. Common bile duct: Diameter: 7 mm Liver: There is diffuse increased liver echogenicity most commonly seen in the setting of fatty infiltration. Superimposed inflammation or fibrosis is not excluded. Clinical correlation is recommended. Portal vein is patent on color Doppler imaging with normal direction of blood flow towards the liver. Other: None. IMPRESSION: 1. Gallbladder sludge and stones with diffuse edema as seen on the earlier CT. A hepatobiliary scintigraphy may provide better evaluation of the gallbladder if there is a high clinical concern for acute cholecystitis . 2. Fatty liver. Electronically Signed   By: Anner Crete M.D.   On: 11/15/2019 21:02
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
## 2019-11-16 NOTE — Evaluation (Signed)
Physical Therapy Evaluation Patient Details Name: Cody Moss MRN: [id number] DOB: Mar 11, 1941 Today's Date: 11/16/2019   History of Present Illness  79 y.o. male with medical history significant of HTN, DM on oral meds, h/o Lumbar spine surgery, AAA repair, pulmonary fibrosis, tobacco abuse, CAD s/p PCI/Stent. Pt admitted on 7/25 for Acute Hypoxic Resp. Failure due to bacterial pneumonia with cavitary lung lesion along with incidental acute COVID-19 viral infection. Pt also with cholecystitis, possible intervention to come.  Clinical Impression   Pt presents with mild dyspnea on exertion with sats 88% and greater on RA and impaired activity tolerance vs baseline. Pt to benefit from acute PT to address deficits. Pt ambulated hallway distance with supervision level of assist only, main issue being dyspnea. PT anticipates no PT follow up, will continue to follow for activity progression and to prevent further pulmonary involvement. PT to progress mobility as tolerated, and will continue to follow acutely.      Follow Up Recommendations No PT follow up    Equipment Recommendations  None recommended by PT    Recommendations for Other Services       Precautions / Restrictions Precautions Precautions: Fall (moderate) Precaution Comments: sats Restrictions Weight Bearing Restrictions: No      Mobility  Bed Mobility Overal bed mobility: Modified Independent             General bed mobility comments: increased time  Transfers Overall transfer level: Needs assistance Equipment used: None Transfers: Sit to/from Stand Sit to Stand: Supervision         General transfer comment: for safety  Ambulation/Gait Ambulation/Gait assistance: Supervision Gait Distance (Feet): 300 Feet Assistive device: None Gait Pattern/deviations: Step-through pattern;Decreased stride length Gait velocity: WFL   General Gait Details: supervision for safety, good gait speed with PT assisting  with lines/leads and telemetry box. SpO2 88% and greater on RA  Stairs            Wheelchair Mobility    Modified Rankin (Stroke Patients Only)       Balance Overall balance assessment: Modified Independent                                           Pertinent Vitals/Pain Pain Assessment: No/denies pain    Home Living Family/patient expects to be discharged to:: Private residence Living Arrangements: Spouse/significant other;Parent Available Help at Discharge: Family Type of Home: House Home Access: Ramped entrance     Home Layout: One level Home Equipment: Shower seat - built in;Grab bars - toilet;Walker - 2 wheels;Cane - single point      Prior Function Level of Independence: Independent         Comments: pt states complete independence PTA     Hand Dominance   Dominant Hand: Right    Extremity/Trunk Assessment   Upper Extremity Assessment Upper Extremity Assessment: Overall WFL for tasks assessed    Lower Extremity Assessment Lower Extremity Assessment: Overall WFL for tasks assessed    Cervical / Trunk Assessment Cervical / Trunk Assessment: Normal  Communication   Communication: No difficulties  Cognition Arousal/Alertness: Awake/alert Behavior During Therapy: WFL for tasks assessed/performed Overall Cognitive Status: Within Functional Limits for tasks assessed  General Comments      Exercises Other Exercises Other Exercises: Educated pt on the importance of up to chair for pulmonary health   Assessment/Plan    PT Assessment Patient needs continued PT services  PT Problem List Decreased mobility;Decreased activity tolerance;Cardiopulmonary status limiting activity       PT Treatment Interventions Therapeutic activities;Therapeutic exercise;Patient/family education;Functional mobility training;Gait training    PT Goals (Current goals can be found in the Care  Plan section)  Acute Rehab PT Goals Patient Stated Goal: go home PT Goal Formulation: With patient Time For Goal Achievement: 11/30/19 Potential to Achieve Goals: Good    Frequency Min 3X/week   Barriers to discharge        Co-evaluation               AM-PAC PT "6 Clicks" Mobility  Outcome Measure Help needed turning from your back to your side while in a flat bed without using bedrails?: None Help needed moving from lying on your back to sitting on the side of a flat bed without using bedrails?: None Help needed moving to and from a bed to a chair (including a wheelchair)?: None Help needed standing up from a chair using your arms (e.g., wheelchair or bedside chair)?: None Help needed to walk in hospital room?: None Help needed climbing 3-5 steps with a railing? : A Little 6 Click Score: 23    End of Session   Activity Tolerance: Patient tolerated treatment well Patient left: in chair;with call bell/phone within reach Nurse Communication: Mobility status PT Visit Diagnosis: Other abnormalities of gait and mobility (R26.89)    Time: 1430-1446 PT Time Calculation (min) (ACUTE ONLY): 16 min   Charges:   PT Evaluation $PT Eval Low Complexity: 1 Low        Daelan Gatt E, PT Acute Rehabilitation Services Pager [phone]  Office [phone]  Keerat Denicola D Elonda Husky 11/16/2019, 4:41 PM

## 2019-11-16 NOTE — Consult Note (Signed)
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                  Strongly encourage                                      initiation of antibiotics    Strongly encourage escalation           of antibiotics                                     -----------------------------                                           PCT <= 0.25 ng/mL                                                 OR                                        > 80% decrease in PCT                                      Discontinue / Do not initiate                                             antibiotics  Performed at [facility], [address], [place], [place] [address]   Glucose, capillary     Status: Abnormal   Collection Time: 11/16/19  8:13 AM  Result Value Ref Range   Glucose-Capillary 151 (H) 70 - 99 mg/dL    Comment: Glucose reference range applies only to samples taken after fasting for at least 8 hours.  Glucose, capillary     Status: Abnormal   Collection Time: 11/16/19 11:49 AM  Result Value Ref  Range   Glucose-Capillary 138 (H) 70 - 99 mg/dL    Comment: Glucose reference range applies only to samples taken after fasting for at least 8 hours.   CT Chest W Contrast  Result Date: 11/15/2019 CLINICAL DATA:  79 year old male with pneumonia. Concern for abscess or effusion. EXAM: CT CHEST, ABDOMEN, AND PELVIS WITH CONTRAST TECHNIQUE: Multidetector CT imaging of the chest, abdomen and pelvis was performed following the standard protocol during bolus administration of intravenous contrast. CONTRAST:  162m OMNIPAQUE IOHEXOL 300 MG/ML  SOLN COMPARISON:  Chest CT dated 02/14/2018. Chest radiograph dated 11/15/2019. FINDINGS: CT CHEST FINDINGS Cardiovascular: There is no cardiomegaly or pericardial effusion. Advanced 3 vessel coronary vascular calcification. There is advanced calcified and noncalcified plaque of the thoracic aorta. No aneurysmal dilatation or dissection. The central pulmonary arteries appear patent. Mediastinum/Nodes: Right hilar adenopathy measuring 13 mm. Subcarinal lymph node measures 10 mm in short axis. The esophagus and the thyroid gland are grossly unremarkable. No mediastinal fluid collection. Lungs/Pleura: There is background of emphysema. Bibasilar subpleural reticulation as seen on the prior CT in keeping with interstitial lung disease. Overall slight progression of the fibrotic changes since the prior CT. There is a 2.6 x 2.7 cm cavitary nodule in the superior segment of the right lower lobe which may represent an abscess, an infected pneumatocele. Fungal infection, TB or cavitary neoplasm are not excluded.  Clinical correlation and follow-up to resolution is recommended. Several scattered bilateral lower lobe nodular densities, present on the prior CT. No lobar consolidation, pleural effusion, or pneumothorax. The central airways are patent. Musculoskeletal: No chest wall mass or suspicious bone lesions identified. CT ABDOMEN PELVIS FINDINGS No intra-abdominal free air or free fluid. Hepatobiliary: There is fatty infiltration of the liver. Slight irregularity of the liver contour may represent early changes of cirrhosis. Elastography may provide better evaluation. There is tumefactive sludge versus noncalcified stones within the gallbladder. There is diffuse gallbladder wall thickening and small pericholecystic fluid. Findings may be related to underlying liver disease although acute cholecystitis is not excluded. Further evaluation with right upper quadrant ultrasound recommended. Pancreas: Unremarkable. No pancreatic ductal dilatation or surrounding inflammatory changes. Spleen: Normal in size without focal abnormality. Adrenals/Urinary Tract: The adrenal glands are unremarkable. Multiple bilateral renal cysts with the largest a lobulated appearing left renal interpolar cyst measuring up to 13 cm in length. Several additional subcentimeter hypodensities are too small to characterize. There is no hydronephrosis on either side. There is symmetric enhancement and excretion of contrast by both kidneys. Mild bilateral perinephric stranding, nonspecific. Correlation with urinalysis recommended to exclude UTI. The visualized ureters and urinary bladder appear unremarkable. Stomach/Bowel: There is severe sigmoid diverticulosis without active inflammatory changes. There is no bowel obstruction or active inflammation. The appendix is normal. Vascular/Lymphatic: There is advanced aortoiliac atherosclerotic disease. 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 The graft appears  patent. There is advanced atherosclerotic calcification of the common femoral arteries bilaterally, right greater left. The IVC is unremarkable. No portal venous gas. There is no adenopathy. Reproductive: Prostatectomy. 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 Musculoskeletal: There is degenerative changes of the spine. No acute osseous pathology. IMPRESSION: 1. A 2.6 x 2.7 cm cavitary nodule in the superior segment of the right lower lobe may represent an abscess, an infected pneumatocele. Fungal infection, TB or cavitary neoplasm are not excluded. Clinical correlation and follow-up to resolution is recommended. 2. Emphysema with slight progression of the interstitial  lung disease since the prior CT. 3. Fatty liver with possible early changes of cirrhosis. Elastography may provide better evaluation. 4. Gallbladder tumefactive sludge versus noncalcified stones. Diffuse gallbladder wall thickening and small pericholecystic fluid may be related to underlying liver disease although acute cholecystitis is not excluded. Further evaluation with right upper quadrant ultrasound recommended. 5. Severe sigmoid diverticulosis. No bowel obstruction. Normal appendix. 6. Aortic Atherosclerosis (ICD10-I70.0) and Emphysema (ICD10-J43.9). Electronically Signed   By: Anner Crete M.D.   On: 11/15/2019 19:11   CT Abdomen Pelvis W Contrast  Result Date: 11/15/2019 CLINICAL DATA:  79 year old male with pneumonia. Concern for abscess or effusion. EXAM: CT CHEST, ABDOMEN, AND PELVIS WITH CONTRAST TECHNIQUE: Multidetector CT imaging of the chest, abdomen and pelvis was performed following the standard protocol during bolus administration of intravenous contrast. CONTRAST:  124m OMNIPAQUE IOHEXOL 300 MG/ML  SOLN COMPARISON:  Chest CT dated 02/14/2018. Chest radiograph dated 11/15/2019. FINDINGS: CT CHEST FINDINGS Cardiovascular: There is no cardiomegaly or pericardial effusion. Advanced 3 vessel coronary  vascular calcification. There is advanced calcified and noncalcified plaque of the thoracic aorta. No aneurysmal dilatation or dissection. The central pulmonary arteries appear patent. Mediastinum/Nodes: Right hilar adenopathy measuring 13 mm. Subcarinal lymph node measures 10 mm in short axis. The esophagus and the thyroid gland are grossly unremarkable. No mediastinal fluid collection. Lungs/Pleura: There is background of emphysema. Bibasilar subpleural reticulation as seen on the prior CT in keeping with interstitial lung disease. Overall slight progression of the fibrotic changes since the prior CT. There is a 2.6 x 2.7 cm cavitary nodule in the superior segment of the right lower lobe which may represent an abscess, an infected pneumatocele. Fungal infection, TB or cavitary neoplasm are not excluded. Clinical correlation and follow-up to resolution is recommended. Several scattered bilateral lower lobe nodular densities, present on the prior CT. No lobar consolidation, pleural effusion, or pneumothorax. The central airways are patent. Musculoskeletal: No chest wall mass or suspicious bone lesions identified. CT ABDOMEN PELVIS FINDINGS No intra-abdominal free air or free fluid. Hepatobiliary: There is fatty infiltration of the liver. Slight irregularity of the liver contour may represent early changes of cirrhosis. Elastography may provide better evaluation. There is tumefactive sludge versus noncalcified stones within the gallbladder. There is diffuse gallbladder wall thickening and small pericholecystic fluid. Findings may be related to underlying liver disease although acute cholecystitis is not excluded. Further evaluation with right upper quadrant ultrasound recommended. Pancreas: Unremarkable. No pancreatic ductal dilatation or surrounding inflammatory changes. Spleen: Normal in size without focal abnormality. Adrenals/Urinary Tract: The adrenal glands are unremarkable. Multiple bilateral renal cysts with  the largest a lobulated appearing left renal interpolar cyst measuring up to 13 cm in length. Several additional subcentimeter hypodensities are too small to characterize. There is no hydronephrosis on either side. There is symmetric enhancement and excretion of contrast by both kidneys. Mild bilateral perinephric stranding, nonspecific. Correlation with urinalysis recommended to exclude UTI. The visualized ureters and urinary bladder appear unremarkable. Stomach/Bowel: There is severe sigmoid diverticulosis without active inflammatory changes. There is no bowel obstruction or active inflammation. The appendix is normal. Vascular/Lymphatic: There is advanced aortoiliac atherosclerotic disease. 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 The graft appears patent. There is advanced atherosclerotic calcification of the common femoral arteries bilaterally, right greater left. The IVC is unremarkable. No portal venous gas. There is no adenopathy. Reproductive: Prostatectomy. 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 Musculoskeletal: There is degenerative  changes of the spine. No acute osseous pathology. IMPRESSION: 1. A 2.6 x 2.7 cm cavitary nodule in the superior segment of the right lower lobe may represent an abscess, an infected pneumatocele. Fungal infection, TB or cavitary neoplasm are not excluded. Clinical correlation and follow-up to resolution is recommended. 2. Emphysema with slight progression of the interstitial lung disease since the prior CT. 3. Fatty liver with possible early changes of cirrhosis. Elastography may provide better evaluation. 4. Gallbladder tumefactive sludge versus noncalcified stones. Diffuse gallbladder wall thickening and small pericholecystic fluid may be related to underlying liver disease although acute cholecystitis is not excluded. Further evaluation with right upper quadrant ultrasound recommended. 5.  Severe sigmoid diverticulosis. No bowel obstruction. Normal appendix. 6. Aortic Atherosclerosis (ICD10-I70.0) and Emphysema (ICD10-J43.9). Electronically Signed   By: Anner Crete M.D.   On: 11/15/2019 19:11   DG Chest Port 1 View  Result Date: 11/16/2019 CLINICAL DATA:  Shortness of breath and fever EXAM: PORTABLE CHEST 1 VIEW COMPARISON:  Chest radiograph and chest CT November 15, 2019 FINDINGS: There is a degree of underlying fibrosis. The cavitary lesion in the right lower lobe seen 1 day prior is less well seen by radiography although is evident, measuring 2.5 x 2.4 cm. There is a degree of lower lobe bronchiectatic change. No new opacity evident. Heart size and pulmonary vascularity are normal. Lymph node prominence seen on CT is not appreciable by radiography. There is aortic atherosclerosis. No bone lesions appreciable. IMPRESSION: Cavitary lesion right lower lobe, much better seen on recent CT. This nodular lesion measures 2.5 x 2.4 cm on portable radiographic examination. There is an underlying degree of fibrosis in the bases. No new opacity evident. Cardiac silhouette stable. Aortic Atherosclerosis (ICD10-I70.0). Electronically Signed   By: Lowella Grip III M.D.   On: 11/16/2019 09:34   DG Chest Port 1 View  Result Date: 11/15/2019 CLINICAL DATA:  Evaluate for infection. EXAM: PORTABLE CHEST 1 VIEW COMPARISON:  07/27/2015 FINDINGS: Lungs are adequately inflated without effusion or pneumothorax. Mild increased density over the right hilum/perihilar region. Cardiomediastinal silhouette and remainder the exam is unchanged. IMPRESSION: Minimal increased density over the right hilar/perihilar region. Medial airspace process is possible. Consider PA and lateral chest radiograph for better evaluation. Electronically Signed   By: Marin Olp M.D.   On: 11/15/2019 17:04   ECHOCARDIOGRAM LIMITED  Result Date: 11/16/2019    ECHOCARDIOGRAM LIMITED REPORT   Patient Name:   ILIYA SPIVACK Date of  Exam: 11/16/2019 Medical Rec #:  [id number]         Height:       70.0 in Accession #:    [id number]        Weight:       187.8 lb Date of Birth:  03-Jun-1940         BSA:          2.032 m Patient Age:    55 years          BP:           103/63 mmHg Patient Gender: M                 HR:           58 bpm. Exam Location:  Inpatient Procedure: Limited Echo, Color Doppler and Cardiac Doppler Indications:    CAD Native Vessel i25.10  History:        Patient has prior history of Echocardiogram examinations, most  recent 07/29/2015. CAD; Risk Factors:Hypertension, Diabetes and                 Dyslipidemia. COVID+ at time of study.  Sonographer:    Raquel Sarna Senior RDCS Referring Phys: [place]  1. Left ventricular ejection fraction, by estimation, is 55 to 60%. The left ventricle has normal function. The left ventricle has no regional wall motion abnormalities. Left ventricular diastolic function could not be evaluated.  2. Right ventricular systolic function is normal. The right ventricular size is normal. There is mildly elevated pulmonary artery systolic pressure. The estimated right ventricular systolic pressure is 01.7 mmHg.  3. The mitral valve is grossly normal. Mild mitral valve regurgitation. No evidence of mitral stenosis.  4. The aortic valve is tricuspid. Aortic valve regurgitation is trivial. Mild aortic valve stenosis. Aortic valve mean gradient measures 9.9 mmHg. Aortic valve Vmax measures 2.15 m/s.  5. The inferior vena cava is dilated in size with <50% respiratory variability, suggesting right atrial pressure of 15 mmHg. Comparison(s): No significant change from prior study. FINDINGS  Left Ventricle: Left ventricular ejection fraction, by estimation, is 55 to 60%. The left ventricle has normal function. The left ventricle has no regional wall motion abnormalities. The left ventricular internal cavity size was normal in size. There is  no left ventricular hypertrophy. Right  Ventricle: The right ventricular size is normal. No increase in right ventricular wall thickness. Right ventricular systolic function is normal. There is mildly elevated pulmonary artery systolic pressure. The tricuspid regurgitant velocity is 2.63  m/s, and with an assumed right atrial pressure of 15 mmHg, the estimated right ventricular systolic pressure is 51.0 mmHg. Left Atrium: Left atrial size was normal in size. Right Atrium: Right atrial size was normal in size. Pericardium: Trivial pericardial effusion is present. Presence of pericardial fat pad. Mitral Valve: The mitral valve is grossly normal. Mild mitral valve regurgitation. No evidence of mitral valve stenosis. Tricuspid Valve: The tricuspid valve is grossly normal. Tricuspid valve regurgitation is trivial. No evidence of tricuspid stenosis. Aortic Valve: The aortic valve is tricuspid. Aortic valve regurgitation is trivial. Mild aortic stenosis is present. Aortic valve mean gradient measures 9.9 mmHg. Aortic valve peak gradient measures 18.4 mmHg. Aortic valve area, by VTI measures 1.26 cm. Pulmonic Valve: The pulmonic valve was grossly normal. Pulmonic valve regurgitation is trivial. No evidence of pulmonic stenosis. Aorta: The aortic root is normal in size and structure. Venous: The inferior vena cava is dilated in size with less than 50% respiratory variability, suggesting right atrial pressure of 15 mmHg. IAS/Shunts: The atrial septum is grossly normal. LEFT VENTRICLE PLAX 2D LVOT diam:     2.10 cm LV SV:         64 LV SV Index:   31 LVOT Area:     3.46 cm  LV Volumes (MOD) LV vol d, MOD A4C: 145.0 ml LV vol s, MOD A4C: 67.7 ml LV SV MOD A4C:     145.0 ml RIGHT VENTRICLE RV S prime:     9.14 cm/s TAPSE (M-mode): 2.3 cm AORTIC VALVE AV Area (Vmax):    1.11 cm AV Area (Vmean):   1.14 cm AV Area (VTI):     1.26 cm AV Vmax:           214.63 cm/s AV Vmean:          148.039 cm/s AV VTI:            0.506 m AV Peak Grad:  18.4 mmHg AV Mean Grad:       9.9 mmHg LVOT Vmax:         68.50 cm/s LVOT Vmean:        48.900 cm/s LVOT VTI:          0.184 m LVOT/AV VTI ratio: 0.36 TRICUSPID VALVE TR Peak grad:   27.7 mmHg TR Vmax:        263.00 cm/s  SHUNTS Systemic VTI:  0.18 m Systemic Diam: 2.10 cm Eleonore Chiquito MD Electronically signed by Eleonore Chiquito MD Signature Date/Time: 11/16/2019/12:16:24 PM    Final    US Abdomen Limited RUQ  Result Date: 11/15/2019 CLINICAL DATA:  79 year old male with nausea vomiting. EXAM: ULTRASOUND ABDOMEN LIMITED RIGHT UPPER QUADRANT COMPARISON:  CT abdomen pelvis dated 11/15/2019. FINDINGS: Gallbladder: There is sludge and stone within the gallbladder. There is diffuse gallbladder wall thickening and edema and a small pericholecystic fluid. Evaluation for sonographic Murphy's sign was limited as the patient was pre-medicated. Common bile duct: Diameter: 7 mm Liver: There is diffuse increased liver echogenicity most commonly seen in the setting of fatty infiltration. Superimposed inflammation or fibrosis is not excluded. Clinical correlation is recommended. Portal vein is patent on color Doppler imaging with normal direction of blood flow towards the liver. Other: None. IMPRESSION: 1. Gallbladder sludge and stones with diffuse edema as seen on the earlier CT. A hepatobiliary scintigraphy may provide better evaluation of the gallbladder if there is a high clinical concern for acute cholecystitis . 2. Fatty liver. Electronically Signed   By: Anner Crete M.D.   On: 11/15/2019 21:02      Assessment/Plan HTN T2DM AAA s/p repair pulmonary fibrosis CAD s/p stent Hx of prostate cancer s/p resection current tobacco abuse chronic back pain COVID-19 infection  Cavitary lung lesion  E. Coli bacteremia  Abdominal pain, nausea and vomiting Possible Cholecystitis  - LFTs elevated yesterday, MRCP was ordered but has not been done yet - [place] and CT yesterday showed gallbladder sludge and stones with edema and fatty liver -  patient somewhat ttp in the RUQ - recommend continuing IV abx and getting a HIDA scan - if positive would recommend IR consult for perc chole drain at this time   FEN: NPO, IVF VTE: lovenox ID: [email or web address]/26>>   Norm Parcel, [facility] Surgery 11/16/2019, 2:19 PM Please see Amion for pager number during day hours 7:00am-4:30pm

## 2019-11-16 NOTE — Progress Notes (Signed)
PHARMACY - PHYSICIAN COMMUNICATION CRITICAL VALUE ALERT - BLOOD CULTURE IDENTIFICATION (BCID)  Cody Moss is an 79 y.o. male who presented to [facility] on 11/15/2019 with a chief complaint of abdominal pain, nausea and vomiting  Assessment:  Patient presents with left upper abdominal pain with nausea and vomiting.  Was febrile on admission and found to be positive for COVID-19.  E.coli bacteremia found - suspect GI source   Name of physician (or Provider) Contacted: Dr. Candiss Norse  Current antibiotics: Zosyn and azithromycin.  Also on remdesivir for Covid-19  Changes to prescribed antibiotics recommended: Stop azithromycin and continue Zosyn Recommendations accepted by provider  Results for orders placed or performed during the hospital encounter of 11/15/19  Blood Culture ID Panel (Reflexed) (Collected: 11/15/2019  5:09 PM)  Result Value Ref Range   Enterococcus species NOT DETECTED NOT DETECTED   Listeria monocytogenes NOT DETECTED NOT DETECTED   Staphylococcus species NOT DETECTED NOT DETECTED   Staphylococcus aureus (BCID) NOT DETECTED NOT DETECTED   Streptococcus species NOT DETECTED NOT DETECTED   Streptococcus agalactiae NOT DETECTED NOT DETECTED   Streptococcus pneumoniae NOT DETECTED NOT DETECTED   Streptococcus pyogenes NOT DETECTED NOT DETECTED   Acinetobacter baumannii NOT DETECTED NOT DETECTED   Enterobacteriaceae species DETECTED (A) NOT DETECTED   Enterobacter cloacae complex NOT DETECTED NOT DETECTED   Escherichia coli DETECTED (A) NOT DETECTED   Klebsiella oxytoca NOT DETECTED NOT DETECTED   Klebsiella pneumoniae NOT DETECTED NOT DETECTED   Proteus species NOT DETECTED NOT DETECTED   Serratia marcescens NOT DETECTED NOT DETECTED   Carbapenem resistance NOT DETECTED NOT DETECTED   Haemophilus influenzae NOT DETECTED NOT DETECTED   Neisseria meningitidis NOT DETECTED NOT DETECTED   Pseudomonas aeruginosa NOT DETECTED NOT DETECTED   Candida albicans NOT DETECTED  NOT DETECTED   Candida glabrata NOT DETECTED NOT DETECTED   Candida krusei NOT DETECTED NOT DETECTED   Candida parapsilosis NOT DETECTED NOT DETECTED   Candida tropicalis NOT DETECTED NOT DETECTED    Candie Mile 11/16/2019  9:23 AM

## 2019-11-16 NOTE — Progress Notes (Signed)
Echocardiogram 2D Echocardiogram has been performed.  Oneal Deputy Nachum Derossett 11/16/2019, 9:24 AM

## 2019-11-17 ENCOUNTER — Inpatient Hospital Stay (HOSPITAL_COMMUNITY): Payer: Medicare Other

## 2019-11-17 DIAGNOSIS — U071 COVID-19: Secondary | ICD-10-CM | POA: Diagnosis not present

## 2019-11-17 DIAGNOSIS — R1012 Left upper quadrant pain: Secondary | ICD-10-CM

## 2019-11-17 DIAGNOSIS — N179 Acute kidney failure, unspecified: Secondary | ICD-10-CM | POA: Diagnosis not present

## 2019-11-17 DIAGNOSIS — J984 Other disorders of lung: Secondary | ICD-10-CM | POA: Diagnosis not present

## 2019-11-17 DIAGNOSIS — K8043 Calculus of bile duct with acute cholecystitis with obstruction: Secondary | ICD-10-CM

## 2019-11-17 DIAGNOSIS — R7881 Bacteremia: Secondary | ICD-10-CM

## 2019-11-17 LAB — MAGNESIUM: Magnesium: 2.2 mg/dL (ref 1.7–2.4)

## 2019-11-17 LAB — GLUCOSE, CAPILLARY
Glucose-Capillary: 154 mg/dL — ABNORMAL HIGH (ref 70–99)
Glucose-Capillary: 93 mg/dL (ref 70–99)
Glucose-Capillary: 82 mg/dL (ref 70–99)
Glucose-Capillary: 229 mg/dL — ABNORMAL HIGH (ref 70–99)
Glucose-Capillary: 260 mg/dL — ABNORMAL HIGH (ref 70–99)

## 2019-11-17 LAB — COMPREHENSIVE METABOLIC PANEL
ALT: 112 U/L — ABNORMAL HIGH (ref 0–44)
AST: 144 U/L — ABNORMAL HIGH (ref 15–41)
Albumin: 2.8 g/dL — ABNORMAL LOW (ref 3.5–5.0)
Alkaline Phosphatase: 74 U/L (ref 38–126)
Anion gap: 8 (ref 5–15)
BUN: 23 mg/dL (ref 8–23)
CO2: 24 mmol/L (ref 22–32)
Calcium: 8.7 mg/dL — ABNORMAL LOW (ref 8.9–10.3)
Chloride: 107 mmol/L (ref 98–111)
Creatinine, Ser: 1.64 mg/dL — ABNORMAL HIGH (ref 0.61–1.24)
GFR calc Af Amer: 46 mL/min — ABNORMAL LOW (ref >60)
GFR calc non Af Amer: 40 mL/min — ABNORMAL LOW (ref >60)
Glucose, Bld: 199 mg/dL — ABNORMAL HIGH (ref 70–99)
Potassium: 4.4 mmol/L (ref 3.5–5.1)
Sodium: 139 mmol/L (ref 135–145)
Total Bilirubin: 1 mg/dL (ref 0.3–1.2)
Total Protein: 5.9 g/dL — ABNORMAL LOW (ref 6.5–8.1)

## 2019-11-17 LAB — CBC WITH DIFFERENTIAL/PLATELET
Abs Immature Granulocytes: 0.03 10*3/uL (ref 0.00–0.07)
Basophils Absolute: 0 10*3/uL (ref 0.0–0.1)
Basophils Relative: 0 %
Eosinophils Absolute: 0 10*3/uL (ref 0.0–0.5)
Eosinophils Relative: 0 %
HCT: 27.3 % — ABNORMAL LOW (ref 39.0–52.0)
Hemoglobin: 8.6 g/dL — ABNORMAL LOW (ref 13.0–17.0)
Immature Granulocytes: 1 %
Lymphocytes Relative: 10 %
Lymphs Abs: 0.7 10*3/uL (ref 0.7–4.0)
MCH: 31.3 pg (ref 26.0–34.0)
MCHC: 31.5 g/dL (ref 30.0–36.0)
MCV: 99.3 fL (ref 80.0–100.0)
Monocytes Absolute: 0.3 10*3/uL (ref 0.1–1.0)
Monocytes Relative: 5 %
Neutro Abs: 5.4 10*3/uL (ref 1.7–7.7)
Neutrophils Relative %: 84 %
Platelets: 247 10*3/uL (ref 150–400)
RBC: 2.75 MIL/uL — ABNORMAL LOW (ref 4.22–5.81)
RDW: 14.9 % (ref 11.5–15.5)
WBC: 6.4 10*3/uL (ref 4.0–10.5)
nRBC: 0 % (ref 0.0–0.2)

## 2019-11-17 LAB — D-DIMER, QUANTITATIVE: D-Dimer, Quant: 2.35 ug/mL-FEU — ABNORMAL HIGH (ref 0.00–0.50)

## 2019-11-17 LAB — C-REACTIVE PROTEIN: CRP: 10.5 mg/dL — ABNORMAL HIGH (ref <1.0)

## 2019-11-17 LAB — BRAIN NATRIURETIC PEPTIDE: B Natriuretic Peptide: 631.2 pg/mL — ABNORMAL HIGH (ref 0.0–100.0)

## 2019-11-17 LAB — HIV ANTIBODY (ROUTINE TESTING W REFLEX): HIV Screen 4th Generation wRfx: NONREACTIVE

## 2019-11-17 LAB — PROCALCITONIN: Procalcitonin: 37.29 ng/mL

## 2019-11-17 MED ORDER — LACTATED RINGERS IV SOLN: INTRAVENOUS | Status: AC

## 2019-11-17 MED ORDER — HYDRALAZINE HCL 20 MG/ML IJ SOLN
10.0000 mg | Freq: Four times a day (QID) | INTRAMUSCULAR | Status: DC | PRN
  Administered 2019-11-19: 10 mg via INTRAVENOUS
  Filled 2019-11-17: qty 1

## 2019-11-17 MED ORDER — TECHNETIUM TC 99M MEBROFENIN IV KIT
5.5000 | PACK | Freq: Once | INTRAVENOUS | Status: AC | PRN
  Administered 2019-11-17: 5.5 via INTRAVENOUS

## 2019-11-17 MED ORDER — ENOXAPARIN SODIUM 40 MG/0.4ML ~~LOC~~ SOLN
40.0000 mg | SUBCUTANEOUS | Status: DC
  Administered 2019-11-19 – 2019-11-20 (×2): 40 mg via SUBCUTANEOUS
  Filled 2019-11-17 (×2): qty 0.4

## 2019-11-17 NOTE — Progress Notes (Signed)
Patient ID: Cody Moss, male   DOB: Dec 20, 1940, 79 y.o.   MRN: [id number]   GI seeing to consider ERCP for CBD stone HIDA pending If HIDA postitive for cholecystitis, will have IR consult for perc chole tube placement given his Covid + status as well as pulmonary status.  Will follow

## 2019-11-17 NOTE — Progress Notes (Signed)
Occupational Therapy Evaluation Patient Details Name: Cody Moss MRN: [id number] DOB: June 27, 1940 Today's Date: 11/17/2019    History of Present Illness 79 y.o. male with medical history significant of HTN, DM on oral meds, h/o Lumbar spine surgery, AAA repair, pulmonary fibrosis, tobacco abuse, CAD s/p PCI/Stent. Pt admitted on 7/25 for Acute Hypoxic Resp. Failure due to bacterial pneumonia with cavitary lung lesion along with incidental acute COVID-19 viral infection. Pt also with cholecystitis, possible intervention to come.   Clinical Impression   Patient lives with spouse in a single level home.  He is independent at prior level and still works as an Programmer, systems.  Today patient presenting with decreased activity tolerance.  Displayed some shortness of breath after walk and requesting to sit before he gets "too tired".  Able to maintain SpO2 >94 even with mobility.  Performed walk and ADLs with supervision.  Will continue to follow with OT acutely to work on building activity tolerance for increased safety when returning home.   Follow Up Recommendations  No OT follow up    Equipment Recommendations  None recommended by OT    Recommendations for Other Services       Precautions / Restrictions Precautions Precautions: Fall      Mobility Bed Mobility Overal bed mobility: Modified Independent             General bed mobility comments: increased time, rail use  Transfers Overall transfer level: Needs assistance Equipment used: None Transfers: Sit to/from Stand Sit to Stand: Supervision         General transfer comment: for safety    Balance Overall balance assessment: Modified Independent                                         ADL either performed or assessed with clinical judgement   ADL Overall ADL's : Needs assistance/impaired                                     Functional mobility during ADLs:  Supervision/safety General ADL Comments: Supervision for safety during eval for all ADLs at this time.     Vision         Perception     Praxis      Pertinent Vitals/Pain Pain Assessment: No/denies pain     Hand Dominance Right   Extremity/Trunk Assessment Upper Extremity Assessment Upper Extremity Assessment: Overall WFL for tasks assessed   Lower Extremity Assessment Lower Extremity Assessment: Defer to PT evaluation   Cervical / Trunk Assessment Cervical / Trunk Assessment: Normal   Communication Communication Communication: No difficulties   Cognition Arousal/Alertness: Awake/alert Behavior During Therapy: WFL for tasks assessed/performed Overall Cognitive Status: Within Functional Limits for tasks assessed                                     General Comments  Patient on room air, BP 133/66    Exercises Exercises: Other exercises Other Exercises Other Exercises: Walked ~530ft in hall   Shoulder Instructions      Home Living Family/patient expects to be discharged to:: Private residence Living Arrangements: Spouse/significant other;Parent Available Help at Discharge: Family Type of Home: House Home Access: Ramped entrance     Home Layout:  One level     Bathroom Shower/Tub: Walk-in shower;Tub only   Bathroom Toilet: Standard     Home Equipment: Shower seat - built in;Grab bars - toilet;Walker - 2 wheels;Cane - single point          Prior Functioning/Environment Level of Independence: Independent        Comments: pt states complete independence PTA. Still works, an Barrister's clerk Problem List: Decreased activity tolerance;Cardiopulmonary status limiting activity      OT Treatment/Interventions:      OT Goals(Current goals can be found in the care plan section) Acute Rehab OT Goals Patient Stated Goal: go home OT Goal Formulation: With patient Time For Goal Achievement: 12/01/19 Potential to Achieve Goals:  Good  OT Frequency: Min 2X/week   Barriers to D/C:            Co-evaluation              AM-PAC OT "6 Clicks" Daily Activity     Outcome Measure Help from another person eating meals?: None Help from another person taking care of personal grooming?: A Little Help from another person toileting, which includes using toliet, bedpan, or urinal?: A Little Help from another person bathing (including washing, rinsing, drying)?: A Little Help from another person to put on and taking off regular upper body clothing?: A Little Help from another person to put on and taking off regular lower body clothing?: A Little 6 Click Score: 19   End of Session Nurse Communication: Mobility status  Activity Tolerance: Patient tolerated treatment well Patient left: in chair;with call bell/phone within reach  OT Visit Diagnosis: Other (comment) (decreased activity tolerance, cardiopulm status)                Time: 1540-0867 OT Time Calculation (min): 21 min Charges:  OT General Charges $OT Visit: 1 Visit OT Evaluation $OT Eval Moderate Complexity: 1 Mod  August Luz, OTR/L   Phylliss Bob 11/17/2019, 9:29 AM

## 2019-11-17 NOTE — Consult Note (Addendum)
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 RIGHT. Dilated collecting systems in the upper pole and in the lower pole LEFT kidney perhaps due to mass-effect upon collecting systems in the setting of large renal sinus cysts with similar appearance to prior imaging studies. Stomach/Bowel: Stomach gastrointestinal tract with limited assessment, unremarkable to the extent evaluated. 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 Other:  None. Musculoskeletal: No suspicious bone lesions identified. IMPRESSION: 1. Choledocholithiasis with mild biliary duct dilation as described. 2. Pericholecystic stranding and signs of wall thickening, remaining suspicious for acute cholecystitis. If there are discordant clinical findings, HIDA scan may be helpful for further assessment. 3. 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 4. Small effusions at the lung bases. 5. Mild hepatic steatosis. Electronically Signed   By: Zetta Bills M.D.   On: 11/17/2019 08:06   DG Chest Port 1 View  Result Date: 11/16/2019 CLINICAL DATA:  Shortness of breath and fever EXAM: PORTABLE CHEST 1 VIEW COMPARISON:  Chest radiograph and chest CT November 15, 2019 FINDINGS: There is a degree of underlying fibrosis. The cavitary lesion in the right lower lobe seen 1 day prior is less well seen by radiography although is evident, measuring 2.5 x 2.4 cm. There is a degree of lower lobe bronchiectatic change. No new opacity evident. Heart size and pulmonary vascularity are normal. Lymph node prominence seen on CT is not appreciable by radiography. There is aortic atherosclerosis. No bone lesions appreciable. IMPRESSION: Cavitary lesion right lower lobe, much better seen on recent CT. This nodular lesion measures 2.5 x 2.4 cm on portable radiographic examination. There is an underlying degree of fibrosis in the bases. No new opacity evident. Cardiac silhouette stable. Aortic Atherosclerosis (ICD10-I70.0). Electronically Signed   By: Lowella Grip III M.D.   On: 11/16/2019 09:34   DG Chest Port 1 View  Result Date: 11/15/2019 CLINICAL DATA:  Evaluate for infection. EXAM: PORTABLE CHEST 1 VIEW COMPARISON:  07/27/2015 FINDINGS: Lungs are adequately inflated without effusion or pneumothorax. Mild increased density over the right hilum/perihilar region. Cardiomediastinal silhouette and remainder the exam is unchanged. IMPRESSION: Minimal increased density over the right hilar/perihilar region. Medial airspace process is possible. Consider PA and lateral chest radiograph for better evaluation. Electronically Signed   By: Marin Olp M.D.   On: 11/15/2019 17:04   ECHOCARDIOGRAM LIMITED  Result Date: 11/16/2019    ECHOCARDIOGRAM LIMITED REPORT   Patient Name:   Cody Moss Date of  Exam: 11/16/2019 Medical Rec #:  [id number]         Height:       70.0 in Accession #:    [id number]        Weight:       187.8 lb Date of Birth:  [birth year]         BSA:          2.032 m Patient Age:    31 years          BP:           103/63 mmHg Patient Gender: M                 HR:           58 bpm. Exam Location:  Inpatient Procedure: Limited Echo, Color Doppler and Cardiac Doppler Indications:    CAD Native Vessel i25.10  History:        Patient has prior history of Echocardiogram examinations, most                 recent 07/29/2015. CAD; Risk Factors:Hypertension, Diabetes and                 Dyslipidemia. COVID+ at time of study.  Sonographer:    [place]  Referring Phys: [place]  1. Left ventricular ejection fraction, by estimation, is 55 to 60%. The left ventricle has normal function. The left ventricle has no regional wall motion abnormalities. Left ventricular diastolic function could not be evaluated.  2. Right ventricular systolic function is normal. The right ventricular size is normal. There is mildly elevated pulmonary artery systolic pressure. The estimated right ventricular systolic pressure is 28.7 mmHg.  3. The mitral valve is grossly normal. Mild mitral valve regurgitation. No evidence of mitral stenosis.  4. The aortic valve is tricuspid. Aortic valve regurgitation is trivial. Mild aortic valve stenosis. Aortic valve mean gradient measures 9.9 mmHg. Aortic valve Vmax measures 2.15 m/s.  5. The inferior vena cava is dilated in size with <50% respiratory variability, suggesting right atrial pressure of 15 mmHg. Comparison(s): No significant change from prior study. FINDINGS  Left Ventricle: Left ventricular ejection fraction, by estimation, is 55 to 60%. The left ventricle has normal function. The left ventricle has no regional wall motion abnormalities. The left ventricular internal cavity size was normal in size. There is  no left ventricular hypertrophy. Right  Ventricle: The right ventricular size is normal. No increase in right ventricular wall thickness. Right ventricular systolic function is normal. There is mildly elevated pulmonary artery systolic pressure. The tricuspid regurgitant velocity is 2.63  m/s, and with an assumed right atrial pressure of 15 mmHg, the estimated right ventricular systolic pressure is 86.7 mmHg. Left Atrium: Left atrial size was normal in size. Right Atrium: Right atrial size was normal in size. Pericardium: Trivial pericardial effusion is present. Presence of pericardial fat pad. Mitral Valve: The mitral valve is grossly normal. Mild mitral valve regurgitation. No evidence of mitral valve stenosis. Tricuspid Valve: The tricuspid valve is grossly normal. Tricuspid valve regurgitation is trivial. No evidence of tricuspid stenosis. Aortic Valve: The aortic valve is tricuspid. Aortic valve regurgitation is trivial. Mild aortic stenosis is present. Aortic valve mean gradient measures 9.9 mmHg. Aortic valve peak gradient measures 18.4 mmHg. Aortic valve area, by VTI measures 1.26 cm. Pulmonic Valve: The pulmonic valve was grossly normal. Pulmonic valve regurgitation is trivial. No evidence of pulmonic stenosis. Aorta: The aortic root is normal in size and structure. Venous: The inferior vena cava is dilated in size with less than 50% respiratory variability, suggesting right atrial pressure of 15 mmHg. IAS/Shunts: The atrial septum is grossly normal. LEFT VENTRICLE PLAX 2D LVOT diam:     2.10 cm LV SV:         64 LV SV Index:   31 LVOT Area:     3.46 cm  LV Volumes (MOD) LV vol d, MOD A4C: 145.0 ml LV vol s, MOD A4C: 67.7 ml LV SV MOD A4C:     145.0 ml RIGHT VENTRICLE RV S prime:     9.14 cm/s TAPSE (M-mode): 2.3 cm AORTIC VALVE AV Area (Vmax):    1.11 cm AV Area (Vmean):   1.14 cm AV Area (VTI):     1.26 cm AV Vmax:           214.63 cm/s AV Vmean:          148.039 cm/s AV VTI:            0.506 m AV Peak Grad:      18.4 mmHg AV Mean Grad:       9.9 mmHg LVOT Vmax:         68.50 cm/s LVOT Vmean:  48.900 cm/s LVOT VTI:          0.184 m LVOT/AV VTI ratio: 0.36 TRICUSPID VALVE TR Peak grad:   27.7 mmHg TR Vmax:        263.00 cm/s  SHUNTS Systemic VTI:  0.18 m Systemic Diam: 2.10 cm Eleonore Chiquito MD Electronically signed by Eleonore Chiquito MD Signature Date/Time: 11/16/2019/12:16:24 PM    Final    US Abdomen Limited RUQ  Result Date: 11/15/2019 CLINICAL DATA:  79 year old male with nausea vomiting. EXAM: ULTRASOUND ABDOMEN LIMITED RIGHT UPPER QUADRANT COMPARISON:  CT abdomen pelvis dated 11/15/2019. FINDINGS: Gallbladder: There is sludge and stone within the gallbladder. There is diffuse gallbladder wall thickening and edema and a small pericholecystic fluid. Evaluation for sonographic Murphy's sign was limited as the patient was pre-medicated. Common bile duct: Diameter: 7 mm Liver: There is diffuse increased liver echogenicity most commonly seen in the setting of fatty infiltration. Superimposed inflammation or fibrosis is not excluded. Clinical correlation is recommended. Portal vein is patent on color Doppler imaging with normal direction of blood flow towards the liver. Other: None. IMPRESSION: 1. Gallbladder sludge and stones with diffuse edema as seen on the earlier CT. A hepatobiliary scintigraphy may provide better evaluation of the gallbladder if there is a high clinical concern for acute cholecystitis . 2. Fatty liver. Electronically Signed   By: Anner Crete M.D.   On: 11/15/2019 21:02     IMPRESSION:   *   Choledocholithiasis.  Acute symptoms of nausea, vomiting, abdominal pain resolved within 12 to 24 hours.  *   E coli bacteremia and E coli, enterobacter PCR +.  Day 2 Zosyn.    *   Cholelithiasis.  ?Cholecystitis, this not confirmed by HIDA today?  *   Covid 19 positive, never vaccinated.  Incidental finding.  No resp sxs PTA but now w acute hypoxic resp failure.  Cavitary lung lesion per CT. wup in progress.     D 2/10  Decadron.  D 2/4 Remdesivir.    *    Normocytic anemia.  Hgb 9.8 >> 8.6.  MCV 99.  Hb was 9.1 in 07/2015.    PLAN:     *    ERCP tomorrow afternoon.  Does not need additional preop antibiotics as he is already receiving Zosyn and azithromycin.  *   Okay to eat a heart healthy diet tonight but clear liquids tomorrow morning and then n.p.o. after 8 AM to be ready for ERCP in the afternoon.  *    Hold tomorrow's dose of Lovenox but resume it on Thursday a.m.   Azucena Freed  11/17/2019, 12:06 PM Phone [phone]

## 2019-11-17 NOTE — Progress Notes (Signed)
SLP Cancellation Note  Patient Details Name: Cody Moss MRN: [id number] DOB: 1941-01-23   Cancelled treatment:       Reason Eval/Treat Not Completed: Other (comment). Unable to complete BSE at this time, as pt is currently NPO for procedure (HIDA scan). Will continue efforts. RN aware.  C[place] Yarbro B. Quentin Ore, [facility], [place] Speech Language Pathologist Office: [phone]  Shonna Chock 11/17/2019, 9:40 AM

## 2019-11-17 NOTE — Progress Notes (Addendum)
PROGRESS NOTE                                                                                                                                                                                                             Patient Demographics:    Cody Moss, is a 79 y.o. male, DOB - [birth year], HUD:[id number]  Outpatient Primary MD for the patient is [name], MD    LOS - 2  Admit date - 11/15/2019    Chief Complaint  Patient presents with  . Weakness       Brief Narrative  Cody Moss is a 79 y.o. male with medical history significant of HTN, DM on oral meds, h/o Lumbar spine surgery, AAA repair, pulmonary fibrosis, tobacco abuse, CAD s/p PCI/Stent. He reports that about 1 week ago he had an episode of abdominal pain located left abdomen that resolved spontaneously. On the day of admission he awoke with moderate to severe abdominal pain, located primarily at Left upper quadrant and left flank accompanied by N/V.  His work-up in the ER showed a incidental COVID-19 infection, cavitary lung lesion with evidence of bacterial infection and elevated procalcitonin, possible calculus cholecystitis.   Subjective:   Patient in bed, appears comfortable, denies any headache, no fever, no chest pain or pressure, proved cough and currently no shortness of breath , no abdominal pain. No focal weakness.    Assessment  & Plan :     1. Severe sepsis with acute Hypoxic Resp. Failure due to bacterial pneumonia with cavitary lung lesion along with incidental acute COVID-19 viral infection.  - at this time his predominant problem seems to be the bacterial component of the infection, his procalcitonin is extremely high and so is his CRP, he has been started on appropriate antibiotics, negative HIV, pending gold QuantiFERON for TB along with sputum Gram stain culture and blood cultures.   Severe sepsis present on admission, sepsis  pathophysiology has resolved and most likely is from cholecystitis. He did not have septic shock as his total bilirubin is elevated due to CBD being obstructed, renal function was at his baseline.  In by pulmonary for cavitary lung lesion recommend appropriate antibiotic treatment for 2 weeks thereafter follow-up with Dr. Melvyn Novas in the office.  Patient has seen Dr. Melvyn Novas before.  For now continue azithromycin and Zosyn.  Upon  discharge can switch to oral forms.  Total of 2 weeks.  For his COVID-19 infection for now low-dose steroid and remdesivir will be continued.  I think this is not causing him any symptoms at this time.  Encouraged the patient to sit up in chair in the daytime use I-S and flutter valve for pulmonary toiletry and then prone in bed when at night.  Will advance activity and titrate down oxygen as possible.   SpO2: 96 % O2 Flow Rate (L/min): 2 L/min  Recent Labs  Lab 11/15/19 1655 11/15/19 1709 11/15/19 2106 11/16/19 0758 11/17/19 0436  WBC  --  7.5  --  8.1 6.4  PLT  --  232  --  242 247  CRP  --   --  4.7* 10.2* 10.5*  DDIMER  --   --  2.87* 2.90* 2.35*  PROCALCITON  --   --  38.17 43.37 37.29  LATICACIDVEN  --  4.5* 2.4*  --   --   SARSCOV2NAA POSITIVE*  --   --   --   --     Hepatic Function Latest Ref Rng & Units 11/17/2019 11/15/2019 07/23/2015  Total Protein 6.5 - 8.1 g/dL 5.9(L) 5.6(L) 7.5  Albumin 3.5 - 5.0 g/dL 2.8(L) 2.9(L) 3.4(L)  AST 15 - 41 U/L 144(H) 274(H) 20  ALT 0 - 44 U/L 112(H) 104(H) 15(L)  Alk Phosphatase 38 - 126 U/L 74 80 28(L)  Total Bilirubin 0.3 - 1.2 mg/dL 1.0 1.9(H) 0.8  Bilirubin, Direct 0.1 - 0.5 mg/dL - - 0.1    2.  Abdominal pain along with nausea vomiting.  With ultrasound and CT scan suggestive of calculus cholecystitis, clear liquids, he is currently pain and nausea free, LFTs were high MRCP was ordered which shows possible CBD stone, GI general surgery both consulted also awaiting HIDA scan, clinically better, continue clear liquids  with IV fluids, antibiotics as above.  He has acute calculus cholecystitis with choledocholithiasis.  May require ERCP followed by either lap choly or drain placement.  3.  History of smoking.  NicoDerm patch.  Counseled to quit smoking.  4.  Chronic low back pain.  Home dose narcotic.  5.  BPH.  On Proscar.  6.  GERD.  On PPI.  7. E. coli bacteremia.  Most likely source could be gallbladder however cavitary lung lesion there as well, continue Zosyn.  8.  History of essential hypertension.  Currently blood pressure soft.  Hydrate and monitor.  9.  AKI on CKD 3.  Baseline creatinine around 1.4, hold ARB, IVF, Renal US.    Condition - Extremely Guarded  Family Communication  :   Wife [name] - [phone] on 11/16/19, daughter Lenna Sciara -  [phone] on 11/16/19, 11/17/19   Code Status :  Full  Consults  :  PCCM, CCS  Procedures  :    HIDA -   Renal US -  MRCP -   1. Choledocholithiasis with mild biliary duct dilation as described. 2. Pericholecystic stranding and signs of wall thickening, remaining suspicious for acute cholecystitis. If there are discordant clinical findings, HIDA scan may be helpful for further assessment. 3. Renal sinus cysts and stable collecting system dilation on the LEFT with renal cysts on the RIGHT and mild perinephric stranding, nonspecific and similar to previous imaging studies. 4. Small effusions at the lung bases. 5. Mild hepatic steatosis.   RUQ [place] - 1. Gallbladder sludge and stones with diffuse edema as seen on the earlier CT. A hepatobiliary scintigraphy may  provide better evaluation of the gallbladder if there is a high clinical concern for acute cholecystitis . 2. Fatty liver.   CT - 1. A 2.6 x 2.7 cm cavitary nodule in the superior segment of the right lower lobe may represent an abscess, an infected pneumatocele. Fungal infection, TB or cavitary neoplasm are not excluded. Clinical correlation and follow-up to resolution is recommended. 2.  Emphysema with slight progression of the interstitial lung disease since the prior CT. 3. Fatty liver with possible early changes of cirrhosis. Elastography may provide better evaluation. 4. Gallbladder tumefactive sludge versus noncalcified stones. Diffuse gallbladder wall thickening and small pericholecystic fluid may be related to underlying liver disease although acute cholecystitis is not excluded. Further evaluation with right upper quadrant ultrasound recommended. 5. Severe sigmoid.   PUD Prophylaxis : PPI  Disposition Plan  :    Status is: Inpatient  Remains inpatient appropriate because:IV treatments appropriate due to intensity of illness or inability to take PO   Dispo: The patient is from: Home              Anticipated d/c is to: Home              Anticipated d/c date is: > 3 days              Patient currently is not medically stable to d/c.   DVT Prophylaxis  :  Lovenox   Lab Results  Component Value Date   PLT 247 11/17/2019    Diet :  Diet Order            Diet NPO time specified Except for: Sips with Meds  Diet effective now                  Inpatient Medications  Scheduled Meds: . allopurinol  300 mg Oral Daily  . aspirin  325 mg Oral Daily  . bicalutamide  50 mg Oral QHS  . Chlorhexidine Gluconate Cloth  6 each Topical Daily  . dexamethasone  6 mg Oral Q24H  . enoxaparin (LOVENOX) injection  40 mg Subcutaneous Q24H  . fenofibrate  160 mg Oral QPM  . finasteride  5 mg Oral QHS  . insulin aspart  0-20 Units Subcutaneous TID WC  . nicotine  21 mg Transdermal Daily  . pantoprazole (PROTONIX) IV  40 mg Intravenous Q12H   Continuous Infusions: . azithromycin Stopped (11/16/19 1359)  . lactated ringers Stopped (11/17/19 0844)  . piperacillin-tazobactam (ZOSYN)  IV 12.5 mL/hr at 11/17/19 1000  . remdesivir 100 mg in NS 100 mL Stopped (11/17/19 0917)   PRN Meds:.acetaminophen, hydrALAZINE, morphine injection, ondansetron (ZOFRAN) IV,  oxyCODONE-acetaminophen  Antibiotics  :    Anti-infectives (From admission, onward)   Start     Dose/Rate Route Frequency Ordered Stop   11/16/19 2000  azithromycin (ZITHROMAX) 500 mg in sodium chloride 0.9 % 250 mL IVPB  Status:  Discontinued        500 mg 250 mL/hr over 60 Minutes Intravenous Every 24 hours 11/16/19 0028 11/16/19 0923   11/16/19 1230  azithromycin (ZITHROMAX) 500 mg in sodium chloride 0.9 % 250 mL IVPB     Discontinue     500 mg 250 mL/hr over 60 Minutes Intravenous Every 24 hours 11/16/19 1121     11/16/19 1000  remdesivir 100 mg in sodium chloride 0.9 % 100 mL IVPB     Discontinue    "Followed by" Linked Group Details   100 mg 200 mL/hr over 30 Minutes  Intravenous Daily 11/15/19 1913 11/20/19 0959   11/16/19 0000  piperacillin-tazobactam (ZOSYN) IVPB 3.375 g     Discontinue     3.375 g 12.5 mL/hr over 240 Minutes Intravenous Every 8 hours 11/15/19 2342     11/15/19 2345  piperacillin-tazobactam (ZOSYN) IVPB 3.375 g  Status:  Discontinued        3.375 g 100 mL/hr over 30 Minutes Intravenous Every 8 hours 11/15/19 2340 11/15/19 2341   11/15/19 2000  remdesivir 200 mg in sodium chloride 0.9% 250 mL IVPB       "Followed by" Linked Group Details   200 mg 580 mL/hr over 30 Minutes Intravenous Once 11/15/19 1913 11/15/19 2220   11/15/19 1730  azithromycin (ZITHROMAX) 500 mg in sodium chloride 0.9 % 250 mL IVPB        500 mg 250 mL/hr over 60 Minutes Intravenous  Once 11/15/19 1723 11/15/19 1915   11/15/19 1700  piperacillin-tazobactam (ZOSYN) IVPB 3.375 g        3.375 g 100 mL/hr over 30 Minutes Intravenous  Once 11/15/19 1650 11/15/19 1739       Time Spent in minutes  30   Lala Lund M.D on 11/17/2019 at 11:11 AM  To page go to [email or web address] - password [email or web address] -  Office  [phone]    See all Orders from today for further details    Objective:   Vitals:   11/16/19 2000 11/17/19 0014 11/17/19 0400 11/17/19 0740  BP: (!) 128/60  (!) 121/62 120/82 (!) 146/71  Pulse: 54 54 72 61  Resp: _0 Temp: 98.3 F (36.8 C) 98.1 F (36.7 C) 98.4 F (36.9 C) 98.1 F (36.7 C)  TempSrc: Oral Oral Oral Oral  SpO2: 92% 92% 93% 96%  Weight:      Height:        Wt Readings from Last 3 Encounters:  11/16/19 85.2 kg  08/12/19 87.5 kg  05/11/19 87.1 kg     Intake/Output Summary (Last 24 hours) at 11/17/2019 1111 Last data filed at 11/17/2019 1000 Gross per 24 hour  Intake 1276.99 ml  Output 1080 ml  Net 196.99 ml     Physical Exam  Awake Alert, No new F.N deficits, Normal affect [place].AT,PERRAL Supple Neck,No JVD, No cervical lymphadenopathy appriciated.  Symmetrical Chest wall movement, Good air movement bilaterally, CTAB RRR,No Gallops, Rubs or new Murmurs, No Parasternal Heave +ve B.Sounds, Abd Soft, mild RUQ tenderness, No organomegaly appriciated, No rebound - guarding or rigidity. No Cyanosis, Clubbing or edema, No new Rash or bruise     Data Review:    CBC Recent Labs  Lab 11/15/19 1709 11/16/19 0758 11/17/19 0436  WBC 7.5 8.1 6.4  HGB 9.8* 8.6* 8.6*  HCT 30.3* 28.0* 27.3*  PLT 232 242 247  MCV 99.3 99.3 99.3  MCH 32.1 30.5 31.3  MCHC 32.3 30.7 31.5  RDW 14.3 14.6 14.9  LYMPHSABS 0.2*  --  0.7  MONOABS 0.5  --  0.3  EOSABS 0.0  --  0.0  BASOSABS 0.0  --  0.0    Chemistries  Recent Labs  Lab 11/15/19 1709 11/15/19 1717 11/16/19 0354 11/16/19 0758 11/17/19 0436  NA 136  --  138  --  139  K 3.6  --  4.2  --  4.4  CL 104  --  107  --  107  CO2 19*  --  24  --  24  GLUCOSE 229*  --  187*  --  199*  BUN 21  --  17  --  23  CREATININE 1.47*  --  1.36*  --  1.64*  CALCIUM 8.3*  --  8.2*  --  8.7*  AST 274*  --   --   --  144*  ALT 104*  --   --   --  112*  ALKPHOS 80  --   --   --  74  BILITOT 1.9*  --   --   --  1.0  MG  --   --   --  1.8 2.2  INR 1.1  --   --   --   --   HGBA1C  --  7.5*  --   --   --        ------------------------------------------------------------------------------------------------------------------ Recent Labs    11/15/19 2106  TRIG 199*    Lab Results  Component Value Date   HGBA1C 7.5 (H) 11/15/2019   ------------------------------------------------------------------------------------------------------------------ No results for input(s): TSH, T4TOTAL, T3FREE, THYROIDAB in the last 72 hours.  Invalid input(s): FREET3  Cardiac Enzymes No results for input(s): CKMB, TROPONINI, MYOGLOBIN in the last 168 hours.  Invalid input(s): CK ------------------------------------------------------------------------------------------------------------------    Component Value Date/Time   BNP 631.2 (H) 11/17/2019 0436    Micro Results Recent Results (from the past 240 hour(s))  SARS Coronavirus 2 by RT PCR (hospital order, performed in [facility] hospital lab) Nasopharyngeal Nasopharyngeal Swab     Status: Abnormal   Collection Time: 11/15/19  4:55 PM   Specimen: Nasopharyngeal Swab  Result Value Ref Range Status   SARS Coronavirus 2 POSITIVE (A) NEGATIVE Final    Comment: RESULT CALLED TO, READ BACK BY AND VERIFIED WITH: RN MEEKS [id number] FCP (NOTE) SARS-CoV-2 target nucleic acids are DETECTED  SARS-CoV-2 RNA is generally detectable in upper respiratory specimens  during the acute phase of infection.  Positive results are indicative  of the presence of the identified virus, but do not rule out bacterial infection or co-infection with other pathogens not detected by the test.  Clinical correlation with patient history and  other diagnostic information is necessary to determine patient infection status.  The expected result is negative.  Fact Sheet for Patients:   StrictlyIdeas.no   Fact Sheet for Healthcare Providers:   BankingDealers.co.za    This test is not yet approved or cleared by the [place] FDA  and  has been authorized for detection and/or diagnosis of SARS-CoV-2 by FDA under an Emergency Use Authorization (EUA).  This EUA will remain in effect (meaning this test can be  used) for the duration of  the COVID-19 declaration under Section 564(b)(1) of the Act, 21 U.S.C. section 360-bbb-3(b)(1), unless the authorization is terminated or revoked sooner.  Performed at [facility], [place] [address] [place], [place] [address]   Blood Culture (routine x 2)     Status: None (Preliminary result)   Collection Time: 11/15/19  5:00 PM   Specimen: BLOOD RIGHT WRIST  Result Value Ref Range Status   Specimen Description BLOOD RIGHT WRIST  Final   Special Requests   Final    BOTTLES DRAWN AEROBIC AND ANAEROBIC Blood Culture adequate volume   Culture   Final    NO GROWTH < 24 HOURS Performed at [facility], [place] [address], [place],  [address]    Report Status PENDING  Incomplete  Blood Culture (routine x 2)     Status: Abnormal (Preliminary result)   Collection Time: 11/15/19  5:09 PM   Specimen: BLOOD  Result Value Ref Range Status   Specimen Description BLOOD RIGHT ANTECUBITAL  Final   Special Requests   Final    BOTTLES DRAWN AEROBIC AND ANAEROBIC Blood Culture results may not be optimal due to an excessive volume of blood received in culture bottles   Culture  Setup Time   Final    GRAM NEGATIVE RODS ANAEROBIC BOTTLE ONLY Organism ID to follow CRITICAL RESULT CALLED TO, READ BACK BY AND VERIFIED WITH: PHARMD Peter Minium [id number] MLM    Culture (A)  Final    ESCHERICHIA COLI SUSCEPTIBILITIES TO FOLLOW Performed at [facility], [address] [place], [place] [address]    Report Status PENDING  Incomplete  Blood Culture ID Panel (Reflexed)     Status: Abnormal   Collection Time: 11/15/19  5:09 PM  Result Value Ref Range Status   Enterococcus species NOT DETECTED NOT DETECTED Final   Listeria monocytogenes NOT DETECTED NOT DETECTED Final    Staphylococcus species NOT DETECTED NOT DETECTED Final   Staphylococcus aureus (BCID) NOT DETECTED NOT DETECTED Final   Streptococcus species NOT DETECTED NOT DETECTED Final   Streptococcus agalactiae NOT DETECTED NOT DETECTED Final   Streptococcus pneumoniae NOT DETECTED NOT DETECTED Final   Streptococcus pyogenes NOT DETECTED NOT DETECTED Final   Acinetobacter baumannii NOT DETECTED NOT DETECTED Final   Enterobacteriaceae species DETECTED (A) NOT DETECTED Final    Comment: Enterobacteriaceae represent a large family of gram-negative bacteria, not a single organism. CRITICAL RESULT CALLED TO, READ BACK BY AND VERIFIED WITH: PHARMD J FRENS [id number] MLM    Enterobacter cloacae complex NOT DETECTED NOT DETECTED Final   Escherichia coli DETECTED (A) NOT DETECTED Final    Comment: CRITICAL RESULT CALLED TO, READ BACK BY AND VERIFIED WITH: PHARMD J FRENS [id number] MLM    Klebsiella oxytoca NOT DETECTED NOT DETECTED Final   Klebsiella pneumoniae NOT DETECTED NOT DETECTED Final   Proteus species NOT DETECTED NOT DETECTED Final   Serratia marcescens NOT DETECTED NOT DETECTED Final   Carbapenem resistance NOT DETECTED NOT DETECTED Final   Haemophilus influenzae NOT DETECTED NOT DETECTED Final   Neisseria meningitidis NOT DETECTED NOT DETECTED Final   Pseudomonas aeruginosa NOT DETECTED NOT DETECTED Final   Candida albicans NOT DETECTED NOT DETECTED Final   Candida glabrata NOT DETECTED NOT DETECTED Final   Candida krusei NOT DETECTED NOT DETECTED Final   Candida parapsilosis NOT DETECTED NOT DETECTED Final   Candida tropicalis NOT DETECTED NOT DETECTED Final    Comment: Performed at [facility], [address], [place], [place] [address]  Urine culture     Status: None   Collection Time: 11/15/19 10:46 PM   Specimen: Urine, Random  Result Value Ref Range Status   Specimen Description URINE, RANDOM  Final   Special Requests NONE  Final   Culture   Final    NO GROWTH Performed  at [facility], [address] [place], [place] [address]    Report Status 11/16/2019 FINAL  Final  MRSA PCR Screening     Status: None   Collection Time: 11/16/19  2:30 AM   Specimen: Nasal Mucosa; Nasopharyngeal  Result Value Ref Range Status   MRSA by PCR NEGATIVE NEGATIVE Final    Comment:        The GeneXpert MRSA Assay (FDA approved for NASAL specimens only), is one component of a comprehensive MRSA colonization surveillance program. It is  not intended to diagnose MRSA infection nor to guide or monitor treatment for MRSA infections. Performed at [facility], [place] [address], [place], [place] [address]     Radiology Reports CT Chest W Contrast  Result Date: 11/15/2019 CLINICAL DATA:  79 year old male with pneumonia. Concern for abscess or effusion. EXAM: CT CHEST, ABDOMEN, AND PELVIS WITH CONTRAST TECHNIQUE: Multidetector CT imaging of the chest, abdomen and pelvis was performed following the standard protocol during bolus administration of intravenous contrast. CONTRAST:  183m OMNIPAQUE IOHEXOL 300 MG/ML  SOLN COMPARISON:  Chest CT dated 02/14/2018. Chest radiograph dated 11/15/2019. FINDINGS: CT CHEST FINDINGS Cardiovascular: There is no cardiomegaly or pericardial effusion. Advanced 3 vessel coronary vascular calcification. There is advanced calcified and noncalcified plaque of the thoracic aorta. No aneurysmal dilatation or dissection. The central pulmonary arteries appear patent. Mediastinum/Nodes: Right hilar adenopathy measuring 13 mm. Subcarinal lymph node measures 10 mm in short axis. The esophagus and the thyroid gland are grossly unremarkable. No mediastinal fluid collection. Lungs/Pleura: There is background of emphysema. Bibasilar subpleural reticulation as seen on the prior CT in keeping with interstitial lung disease. Overall slight progression of the fibrotic changes since the prior CT. There is a 2.6 x 2.7 cm cavitary nodule in the superior segment of the  right lower lobe which may represent an abscess, an infected pneumatocele. Fungal infection, TB or cavitary neoplasm are not excluded. Clinical correlation and follow-up to resolution is recommended. Several scattered bilateral lower lobe nodular densities, present on the prior CT. No lobar consolidation, pleural effusion, or pneumothorax. The central airways are patent. Musculoskeletal: No chest wall mass or suspicious bone lesions identified. CT ABDOMEN PELVIS FINDINGS No intra-abdominal free air or free fluid. Hepatobiliary: There is fatty infiltration of the liver. Slight irregularity of the liver contour may represent early changes of cirrhosis. Elastography may provide better evaluation. There is tumefactive sludge versus noncalcified stones within the gallbladder. There is diffuse gallbladder wall thickening and small pericholecystic fluid. Findings may be related to underlying liver disease although acute cholecystitis is not excluded. Further evaluation with right upper quadrant ultrasound recommended. Pancreas: Unremarkable. No pancreatic ductal dilatation or surrounding inflammatory changes. Spleen: Normal in size without focal abnormality. Adrenals/Urinary Tract: The adrenal glands are unremarkable. Multiple bilateral renal cysts with the largest a lobulated appearing left renal interpolar cyst measuring up to 13 cm in length. Several additional subcentimeter hypodensities are too small to characterize. There is no hydronephrosis on either side. There is symmetric enhancement and excretion of contrast by both kidneys. Mild bilateral perinephric stranding, nonspecific. Correlation with urinalysis recommended to exclude UTI. The visualized ureters and urinary bladder appear unremarkable. Stomach/Bowel: There is severe sigmoid diverticulosis without active inflammatory changes. There is no bowel obstruction or active inflammation. The appendix is normal. Vascular/Lymphatic: There is advanced aortoiliac  atherosclerotic disease. 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 The graft appears patent. There is advanced atherosclerotic calcification of the common femoral arteries bilaterally, right greater left. The IVC is unremarkable. No portal venous gas. There is no adenopathy. Reproductive: Prostatectomy. 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 Musculoskeletal: There is degenerative changes of the spine. No acute osseous pathology. IMPRESSION: 1. A 2.6 x 2.7 cm cavitary nodule in the superior segment of the right lower lobe may represent an abscess, an infected pneumatocele. Fungal infection, TB or cavitary neoplasm are not excluded. Clinical correlation and follow-up to resolution is recommended. 2. Emphysema with slight progression  of the interstitial lung disease since the prior CT. 3. Fatty liver with possible early changes of cirrhosis. Elastography may provide better evaluation. 4. Gallbladder tumefactive sludge versus noncalcified stones. Diffuse gallbladder wall thickening and small pericholecystic fluid may be related to underlying liver disease although acute cholecystitis is not excluded. Further evaluation with right upper quadrant ultrasound recommended. 5. Severe sigmoid diverticulosis. No bowel obstruction. Normal appendix. 6. Aortic Atherosclerosis (ICD10-I70.0) and Emphysema (ICD10-J43.9). Electronically Signed   By: Anner Crete M.D.   On: 11/15/2019 19:11   CT Abdomen Pelvis W Contrast  Result Date: 11/15/2019 CLINICAL DATA:  79 year old male with pneumonia. Concern for abscess or effusion. EXAM: CT CHEST, ABDOMEN, AND PELVIS WITH CONTRAST TECHNIQUE: Multidetector CT imaging of the chest, abdomen and pelvis was performed following the standard protocol during bolus administration of intravenous contrast. CONTRAST:  163m OMNIPAQUE IOHEXOL 300 MG/ML  SOLN COMPARISON:  Chest CT dated 02/14/2018. Chest radiograph  dated 11/15/2019. FINDINGS: CT CHEST FINDINGS Cardiovascular: There is no cardiomegaly or pericardial effusion. Advanced 3 vessel coronary vascular calcification. There is advanced calcified and noncalcified plaque of the thoracic aorta. No aneurysmal dilatation or dissection. The central pulmonary arteries appear patent. Mediastinum/Nodes: Right hilar adenopathy measuring 13 mm. Subcarinal lymph node measures 10 mm in short axis. The esophagus and the thyroid gland are grossly unremarkable. No mediastinal fluid collection. Lungs/Pleura: There is background of emphysema. Bibasilar subpleural reticulation as seen on the prior CT in keeping with interstitial lung disease. Overall slight progression of the fibrotic changes since the prior CT. There is a 2.6 x 2.7 cm cavitary nodule in the superior segment of the right lower lobe which may represent an abscess, an infected pneumatocele. Fungal infection, TB or cavitary neoplasm are not excluded. Clinical correlation and follow-up to resolution is recommended. Several scattered bilateral lower lobe nodular densities, present on the prior CT. No lobar consolidation, pleural effusion, or pneumothorax. The central airways are patent. Musculoskeletal: No chest wall mass or suspicious bone lesions identified. CT ABDOMEN PELVIS FINDINGS No intra-abdominal free air or free fluid. Hepatobiliary: There is fatty infiltration of the liver. Slight irregularity of the liver contour may represent early changes of cirrhosis. Elastography may provide better evaluation. There is tumefactive sludge versus noncalcified stones within the gallbladder. There is diffuse gallbladder wall thickening and small pericholecystic fluid. Findings may be related to underlying liver disease although acute cholecystitis is not excluded. Further evaluation with right upper quadrant ultrasound recommended. Pancreas: Unremarkable. No pancreatic ductal dilatation or surrounding inflammatory changes. Spleen:  Normal in size without focal abnormality. Adrenals/Urinary Tract: The adrenal glands are unremarkable. Multiple bilateral renal cysts with the largest a lobulated appearing left renal interpolar cyst measuring up to 13 cm in length. Several additional subcentimeter hypodensities are too small to characterize. There is no hydronephrosis on either side. There is symmetric enhancement and excretion of contrast by both kidneys. Mild bilateral perinephric stranding, nonspecific. Correlation with urinalysis recommended to exclude UTI. The visualized ureters and urinary bladder appear unremarkable. Stomach/Bowel: There is severe sigmoid diverticulosis without active inflammatory changes. There is no bowel obstruction or active inflammation. The appendix is normal. Vascular/Lymphatic: There is advanced aortoiliac atherosclerotic disease. 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 The graft appears patent. There is advanced atherosclerotic calcification of the common femoral arteries bilaterally, right greater left. The IVC is unremarkable. No portal venous gas. There is no adenopathy. Reproductive: Prostatectomy. 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 Musculoskeletal:  There is degenerative changes of the spine. No acute osseous pathology. IMPRESSION: 1. A 2.6 x 2.7 cm cavitary nodule in the superior segment of the right lower lobe may represent an abscess, an infected pneumatocele. Fungal infection, TB or cavitary neoplasm are not excluded. Clinical correlation and follow-up to resolution is recommended. 2. Emphysema with slight progression of the interstitial lung disease since the prior CT. 3. Fatty liver with possible early changes of cirrhosis. Elastography may provide better evaluation. 4. Gallbladder tumefactive sludge versus noncalcified stones. Diffuse gallbladder wall thickening and small pericholecystic fluid may be related to  underlying liver disease although acute cholecystitis is not excluded. Further evaluation with right upper quadrant ultrasound recommended. 5. Severe sigmoid diverticulosis. No bowel obstruction. Normal appendix. 6. Aortic Atherosclerosis (ICD10-I70.0) and Emphysema (ICD10-J43.9). Electronically Signed   By: Anner Crete M.D.   On: 11/15/2019 19:11   MR ABDOMEN MRCP WO CONTRAST  Result Date: 11/17/2019 CLINICAL DATA:  Cholelithiasis EXAM: MRI ABDOMEN WITHOUT CONTRAST  (INCLUDING MRCP) TECHNIQUE: Multiplanar multisequence MR imaging of the abdomen was performed. Heavily T2-weighted images of the biliary and pancreatic ducts were obtained, and three-dimensional MRCP images were rendered by post processing. COMPARISON:  CT chest, abdomen and pelvis of November 15, 2019 FINDINGS: Lower chest: Small effusions at the lung bases. As basilar airspace disease and parenchymal findings demonstrated on the recent chest CT are not well evaluated on the MRI. Hepatobiliary: Mild hepatic steatosis. No focal, suspicious hepatic lesion, assessment limited by the noncontrast imaging. Pericholecystic stranding and signs of wall thickening. Mild biliary duct distension with numerous filling defects in the common bile duct compatible with multiple small stones. Largest approximately 5 mm. At least 4-5 additional small biliary calculi in the common bile duct. Pancreas:  No ductal dilation.  No peripancreatic inflammation. Spleen: Skin normal in size and contour without focal, suspicious lesion. Adrenals/Urinary Tract:  Adrenal glands are normal spleen. Bilateral renal cysts. Asymmetric albeit slightly Peri nephric stranding greater on the LEFT than the RIGHT. Dilated collecting systems in the upper pole and in the lower pole LEFT kidney perhaps due to mass-effect upon collecting systems in the setting of large renal sinus cysts with similar appearance to prior imaging studies. Stomach/Bowel: Stomach gastrointestinal tract with limited  assessment, unremarkable to the extent evaluated. 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 Other:  None. Musculoskeletal: No suspicious bone lesions identified. IMPRESSION: 1. Choledocholithiasis with mild biliary duct dilation as described. 2. Pericholecystic stranding and signs of wall thickening, remaining suspicious for acute cholecystitis. If there are discordant clinical findings, HIDA scan may be helpful for further assessment. 3. Renal sinus cysts and stable collecting system dilation on the LEFT with renal cysts on the RIGHT and mild perinephric stranding, nonspecific and similar to previous imaging studies. 4. Small effusions at the lung bases. 5. Mild hepatic steatosis. Electronically Signed   By: Zetta Bills M.D.   On: 11/17/2019 08:06   DG Chest Port 1 View  Result Date: 11/16/2019 CLINICAL DATA:  Shortness of breath and fever EXAM: PORTABLE CHEST 1 VIEW COMPARISON:  Chest radiograph and chest CT November 15, 2019 FINDINGS: There is a degree of underlying fibrosis. The cavitary lesion in the right lower lobe seen 1 day prior is less well seen by radiography although is evident, measuring 2.5 x 2.4 cm. There is a degree of lower lobe bronchiectatic change. No new opacity evident. Heart size and pulmonary vascularity are normal. Lymph node prominence seen on CT  is not appreciable by radiography. There is aortic atherosclerosis. No bone lesions appreciable. IMPRESSION: Cavitary lesion right lower lobe, much better seen on recent CT. This nodular lesion measures 2.5 x 2.4 cm on portable radiographic examination. There is an underlying degree of fibrosis in the bases. No new opacity evident. Cardiac silhouette stable. Aortic Atherosclerosis (ICD10-I70.0). Electronically Signed   By: Lowella Grip III M.D.   On: 11/16/2019 09:34   DG Chest Port 1 View  Result Date: 11/15/2019 CLINICAL DATA:  Evaluate for infection.  EXAM: PORTABLE CHEST 1 VIEW COMPARISON:  07/27/2015 FINDINGS: Lungs are adequately inflated without effusion or pneumothorax. Mild increased density over the right hilum/perihilar region. Cardiomediastinal silhouette and remainder the exam is unchanged. IMPRESSION: Minimal increased density over the right hilar/perihilar region. Medial airspace process is possible. Consider PA and lateral chest radiograph for better evaluation. Electronically Signed   By: Marin Olp M.D.   On: 11/15/2019 17:04   ECHOCARDIOGRAM LIMITED  Result Date: 11/16/2019    ECHOCARDIOGRAM LIMITED REPORT   Patient Name:   Cody Moss Date of Exam: 11/16/2019 Medical Rec #:  [id number]         Height:       70.0 in Accession #:    [id number]        Weight:       187.8 lb Date of Birth:  06-24-1940         BSA:          2.032 m Patient Age:    89 years          BP:           103/63 mmHg Patient Gender: M                 HR:           58 bpm. Exam Location:  Inpatient Procedure: Limited Echo, Color Doppler and Cardiac Doppler Indications:    CAD Native Vessel i25.10  History:        Patient has prior history of Echocardiogram examinations, most                 recent 07/29/2015. CAD; Risk Factors:Hypertension, Diabetes and                 Dyslipidemia. COVID+ at time of study.  Sonographer:    Raquel Sarna Senior RDCS Referring Phys: [place]  1. Left ventricular ejection fraction, by estimation, is 55 to 60%. The left ventricle has normal function. The left ventricle has no regional wall motion abnormalities. Left ventricular diastolic function could not be evaluated.  2. Right ventricular systolic function is normal. The right ventricular size is normal. There is mildly elevated pulmonary artery systolic pressure. The estimated right ventricular systolic pressure is 01.0 mmHg.  3. The mitral valve is grossly normal. Mild mitral valve regurgitation. No evidence of mitral stenosis.  4. The aortic valve is tricuspid.  Aortic valve regurgitation is trivial. Mild aortic valve stenosis. Aortic valve mean gradient measures 9.9 mmHg. Aortic valve Vmax measures 2.15 m/s.  5. The inferior vena cava is dilated in size with <50% respiratory variability, suggesting right atrial pressure of 15 mmHg. Comparison(s): No significant change from prior study. FINDINGS  Left Ventricle: Left ventricular ejection fraction, by estimation, is 55 to 60%. The left ventricle has normal function. The left ventricle has no regional wall motion abnormalities. The left ventricular internal cavity size was normal in size. There is  no left ventricular  hypertrophy. Right Ventricle: The right ventricular size is normal. No increase in right ventricular wall thickness. Right ventricular systolic function is normal. There is mildly elevated pulmonary artery systolic pressure. The tricuspid regurgitant velocity is 2.63  m/s, and with an assumed right atrial pressure of 15 mmHg, the estimated right ventricular systolic pressure is 16.1 mmHg. Left Atrium: Left atrial size was normal in size. Right Atrium: Right atrial size was normal in size. Pericardium: Trivial pericardial effusion is present. Presence of pericardial fat pad. Mitral Valve: The mitral valve is grossly normal. Mild mitral valve regurgitation. No evidence of mitral valve stenosis. Tricuspid Valve: The tricuspid valve is grossly normal. Tricuspid valve regurgitation is trivial. No evidence of tricuspid stenosis. Aortic Valve: The aortic valve is tricuspid. Aortic valve regurgitation is trivial. Mild aortic stenosis is present. Aortic valve mean gradient measures 9.9 mmHg. Aortic valve peak gradient measures 18.4 mmHg. Aortic valve area, by VTI measures 1.26 cm. Pulmonic Valve: The pulmonic valve was grossly normal. Pulmonic valve regurgitation is trivial. No evidence of pulmonic stenosis. Aorta: The aortic root is normal in size and structure. Venous: The inferior vena cava is dilated in size with  less than 50% respiratory variability, suggesting right atrial pressure of 15 mmHg. IAS/Shunts: The atrial septum is grossly normal. LEFT VENTRICLE PLAX 2D LVOT diam:     2.10 cm LV SV:         64 LV SV Index:   31 LVOT Area:     3.46 cm  LV Volumes (MOD) LV vol d, MOD A4C: 145.0 ml LV vol s, MOD A4C: 67.7 ml LV SV MOD A4C:     145.0 ml RIGHT VENTRICLE RV S prime:     9.14 cm/s TAPSE (M-mode): 2.3 cm AORTIC VALVE AV Area (Vmax):    1.11 cm AV Area (Vmean):   1.14 cm AV Area (VTI):     1.26 cm AV Vmax:           214.63 cm/s AV Vmean:          148.039 cm/s AV VTI:            0.506 m AV Peak Grad:      18.4 mmHg AV Mean Grad:      9.9 mmHg LVOT Vmax:         68.50 cm/s LVOT Vmean:        48.900 cm/s LVOT VTI:          0.184 m LVOT/AV VTI ratio: 0.36 TRICUSPID VALVE TR Peak grad:   27.7 mmHg TR Vmax:        263.00 cm/s  SHUNTS Systemic VTI:  0.18 m Systemic Diam: 2.10 cm Eleonore Chiquito MD Electronically signed by Eleonore Chiquito MD Signature Date/Time: 11/16/2019/12:16:24 PM    Final    US Abdomen Limited RUQ  Result Date: 11/15/2019 CLINICAL DATA:  79 year old male with nausea vomiting. EXAM: ULTRASOUND ABDOMEN LIMITED RIGHT UPPER QUADRANT COMPARISON:  CT abdomen pelvis dated 11/15/2019. FINDINGS: Gallbladder: There is sludge and stone within the gallbladder. There is diffuse gallbladder wall thickening and edema and a small pericholecystic fluid. Evaluation for sonographic Murphy's sign was limited as the patient was pre-medicated. Common bile duct: Diameter: 7 mm Liver: There is diffuse increased liver echogenicity most commonly seen in the setting of fatty infiltration. Superimposed inflammation or fibrosis is not excluded. Clinical correlation is recommended. Portal vein is patent on color Doppler imaging with normal direction of blood flow towards the liver. Other: None. IMPRESSION: 1. Gallbladder sludge and  stones with diffuse edema as seen on the earlier CT. A hepatobiliary scintigraphy may provide better  evaluation of the gallbladder if there is a high clinical concern for acute cholecystitis . 2. Fatty liver. Electronically Signed   By: Anner Crete M.D.   On: 11/15/2019 21:02

## 2019-11-17 NOTE — Progress Notes (Signed)
Physical Therapy Treatment Patient Details Name: Cody Moss MRN: [id number] DOB: 1941/04/11 Today's Date: 11/17/2019    History of Present Illness 79 y.o. male with medical history significant of HTN, DM on oral meds, h/o Lumbar spine surgery, AAA repair, pulmonary fibrosis, tobacco abuse, CAD s/p PCI/Stent. Pt admitted on 7/25 for Acute Hypoxic Resp. Failure due to bacterial pneumonia with cavitary lung lesion along with incidental acute COVID-19 viral infection. Pt also with cholecystitis, possible intervention to come.    PT Comments    Pt fairly eager to get out in the halls to build up stamina and gait stability.  Sats remained between 95-98% and EHR in the 90's overall.    Follow Up Recommendations  No PT follow up     Equipment Recommendations  None recommended by PT    Recommendations for Other Services       Precautions / Restrictions Precautions Precautions: Fall    Mobility  Bed Mobility Overal bed mobility: Modified Independent                Transfers Overall transfer level: Modified independent                  Ambulation/Gait Ambulation/Gait assistance: Supervision Gait Distance (Feet): 300 Feet Assistive device: None Gait Pattern/deviations: Step-through pattern;Decreased stride length   Gait velocity interpretation: >2.62 ft/sec, indicative of community ambulatory General Gait Details: steady, able to speed up appreciably to cuing, but with short step length.  Sats remained in the 95-98% range with EHR in the low 90's   Stairs             Wheelchair Mobility    Modified Rankin (Stroke Patients Only)       Balance Overall balance assessment: Modified Independent                                          Cognition Arousal/Alertness: Awake/alert Behavior During Therapy: WFL for tasks assessed/performed Overall Cognitive Status: Within Functional Limits for tasks assessed                                         Exercises General Exercises - Lower Extremity Hip ABduction/ADduction: AROM;Strengthening;10 reps;Standing Hip Flexion/Marching: AROM;Strengthening;Both;10 reps;Standing    General Comments        Pertinent Vitals/Pain Pain Assessment: No/denies pain    Home Living                      Prior Function            PT Goals (current goals can now be found in the care plan section) Acute Rehab PT Goals Patient Stated Goal: go home PT Goal Formulation: With patient Time For Goal Achievement: 11/30/19 Potential to Achieve Goals: Good Progress towards PT goals: Progressing toward goals    Frequency    Min 3X/week      PT Plan Current plan remains appropriate    Co-evaluation              AM-PAC PT "6 Clicks" Mobility   Outcome Measure  Help needed turning from your back to your side while in a flat bed without using bedrails?: None Help needed moving from lying on your back to sitting on the side of a flat bed without  using bedrails?: None Help needed moving to and from a bed to a chair (including a wheelchair)?: None Help needed standing up from a chair using your arms (e.g., wheelchair or bedside chair)?: None Help needed to walk in hospital room?: None Help needed climbing 3-5 steps with a railing? : A Little 6 Click Score: 23    End of Session   Activity Tolerance: Patient tolerated treatment well Patient left: in bed;with call bell/phone within reach Nurse Communication: Mobility status PT Visit Diagnosis: Other abnormalities of gait and mobility (R26.89)     Time: 3552-1747 PT Time Calculation (min) (ACUTE ONLY): 21 min  Charges:  $Gait Training: 8-22 mins                     11/17/2019  Ginger Carne., PT Acute Rehabilitation Services [phone]  (pager) [phone]  (office)   Cody Moss 11/17/2019, 5:30 PM

## 2019-11-18 ENCOUNTER — Encounter (HOSPITAL_COMMUNITY)
Admission: EM | Disposition: A | Discharge status: Home / Self Care | Payer: Self-pay | Source: Home / Self Care | Attending: Internal Medicine

## 2019-11-18 ENCOUNTER — Inpatient Hospital Stay (HOSPITAL_COMMUNITY): Payer: Medicare Other

## 2019-11-18 ENCOUNTER — Inpatient Hospital Stay (HOSPITAL_COMMUNITY): Payer: Medicare Other | Admitting: Anesthesiology

## 2019-11-18 ENCOUNTER — Encounter (HOSPITAL_COMMUNITY): Payer: Self-pay | Admitting: Internal Medicine

## 2019-11-18 DIAGNOSIS — K8050 Calculus of bile duct without cholangitis or cholecystitis without obstruction: Secondary | ICD-10-CM

## 2019-11-18 DIAGNOSIS — K8051 Calculus of bile duct without cholangitis or cholecystitis with obstruction: Secondary | ICD-10-CM

## 2019-11-18 HISTORY — PX: SPHINCTEROTOMY: SHX5544

## 2019-11-18 HISTORY — PX: REMOVAL OF STONES: SHX5545

## 2019-11-18 HISTORY — PX: ENDOSCOPIC RETROGRADE CHOLANGIOPANCREATOGRAPHY (ERCP) WITH PROPOFOL: SHX5810

## 2019-11-18 LAB — CBC WITH DIFFERENTIAL/PLATELET
Abs Immature Granulocytes: 0.02 10*3/uL (ref 0.00–0.07)
Basophils Absolute: 0 10*3/uL (ref 0.0–0.1)
Basophils Relative: 0 %
Eosinophils Absolute: 0 10*3/uL (ref 0.0–0.5)
Eosinophils Relative: 0 %
HCT: 26.3 % — ABNORMAL LOW (ref 39.0–52.0)
Hemoglobin: 8.5 g/dL — ABNORMAL LOW (ref 13.0–17.0)
Immature Granulocytes: 0 %
Lymphocytes Relative: 9 %
Lymphs Abs: 0.5 10*3/uL — ABNORMAL LOW (ref 0.7–4.0)
MCH: 31.3 pg (ref 26.0–34.0)
MCHC: 32.3 g/dL (ref 30.0–36.0)
MCV: 96.7 fL (ref 80.0–100.0)
Monocytes Absolute: 0.3 10*3/uL (ref 0.1–1.0)
Monocytes Relative: 4 %
Neutro Abs: 5.5 10*3/uL (ref 1.7–7.7)
Neutrophils Relative %: 87 %
Platelets: 239 10*3/uL (ref 150–400)
RBC: 2.72 MIL/uL — ABNORMAL LOW (ref 4.22–5.81)
RDW: 14.9 % (ref 11.5–15.5)
WBC: 6.3 10*3/uL (ref 4.0–10.5)
nRBC: 0 % (ref 0.0–0.2)

## 2019-11-18 LAB — QUANTIFERON-TB GOLD PLUS (RQFGPL)
QuantiFERON Mitogen Value: 0.69 IU/mL
QuantiFERON Nil Value: 0 IU/mL
QuantiFERON TB1 Ag Value: 0 IU/mL
QuantiFERON TB2 Ag Value: 0 IU/mL

## 2019-11-18 LAB — COMPREHENSIVE METABOLIC PANEL
ALT: 72 U/L — ABNORMAL HIGH (ref 0–44)
AST: 52 U/L — ABNORMAL HIGH (ref 15–41)
Albumin: 2.8 g/dL — ABNORMAL LOW (ref 3.5–5.0)
Alkaline Phosphatase: 67 U/L (ref 38–126)
Anion gap: 11 (ref 5–15)
BUN: 24 mg/dL — ABNORMAL HIGH (ref 8–23)
CO2: 21 mmol/L — ABNORMAL LOW (ref 22–32)
Calcium: 8.5 mg/dL — ABNORMAL LOW (ref 8.9–10.3)
Chloride: 102 mmol/L (ref 98–111)
Creatinine, Ser: 1.47 mg/dL — ABNORMAL HIGH (ref 0.61–1.24)
GFR calc Af Amer: 53 mL/min — ABNORMAL LOW (ref >60)
GFR calc non Af Amer: 46 mL/min — ABNORMAL LOW (ref >60)
Glucose, Bld: 301 mg/dL — ABNORMAL HIGH (ref 70–99)
Potassium: 4.3 mmol/L (ref 3.5–5.1)
Sodium: 134 mmol/L — ABNORMAL LOW (ref 135–145)
Total Bilirubin: 0.6 mg/dL (ref 0.3–1.2)
Total Protein: 5.9 g/dL — ABNORMAL LOW (ref 6.5–8.1)

## 2019-11-18 LAB — GLUCOSE, CAPILLARY
Glucose-Capillary: 191 mg/dL — ABNORMAL HIGH (ref 70–99)
Glucose-Capillary: 121 mg/dL — ABNORMAL HIGH (ref 70–99)
Glucose-Capillary: 212 mg/dL — ABNORMAL HIGH (ref 70–99)

## 2019-11-18 LAB — CULTURE, BLOOD (ROUTINE X 2)

## 2019-11-18 LAB — EXPECTORATED SPUTUM ASSESSMENT W GRAM STAIN, RFLX TO RESP C

## 2019-11-18 LAB — BRAIN NATRIURETIC PEPTIDE: B Natriuretic Peptide: 658.2 pg/mL — ABNORMAL HIGH (ref 0.0–100.0)

## 2019-11-18 LAB — PROCALCITONIN: Procalcitonin: 22.39 ng/mL

## 2019-11-18 LAB — QUANTIFERON-TB GOLD PLUS: QuantiFERON-TB Gold Plus: NEGATIVE

## 2019-11-18 LAB — D-DIMER, QUANTITATIVE: D-Dimer, Quant: 1.88 ug/mL-FEU — ABNORMAL HIGH (ref 0.00–0.50)

## 2019-11-18 LAB — MAGNESIUM: Magnesium: 2.1 mg/dL (ref 1.7–2.4)

## 2019-11-18 LAB — C-REACTIVE PROTEIN: CRP: 6.7 mg/dL — ABNORMAL HIGH (ref <1.0)

## 2019-11-18 SURGERY — ENDOSCOPIC RETROGRADE CHOLANGIOPANCREATOGRAPHY (ERCP) WITH PROPOFOL
Anesthesia: General

## 2019-11-18 MED ORDER — LIDOCAINE 2% (20 MG/ML) 5 ML SYRINGE
INTRAMUSCULAR | Status: AC
  Filled 2019-11-18: qty 5

## 2019-11-18 MED ORDER — LIDOCAINE 2% (20 MG/ML) 5 ML SYRINGE
INTRAMUSCULAR | Status: DC | PRN
  Administered 2019-11-18: 80 mg via INTRAVENOUS

## 2019-11-18 MED ORDER — PROPOFOL 10 MG/ML IV BOLUS
INTRAVENOUS | Status: DC | PRN
  Administered 2019-11-18: 150 mg via INTRAVENOUS

## 2019-11-18 MED ORDER — FENTANYL CITRATE (PF) 250 MCG/5ML IJ SOLN
INTRAMUSCULAR | Status: DC | PRN
  Administered 2019-11-18: 50 ug via INTRAVENOUS

## 2019-11-18 MED ORDER — SODIUM CHLORIDE 0.9 % IV SOLN
INTRAVENOUS | Status: DC | PRN
  Administered 2019-11-18: 30 mL

## 2019-11-18 MED ORDER — INDOMETHACIN 50 MG RE SUPP
RECTAL | Status: AC
  Filled 2019-11-18: qty 2

## 2019-11-18 MED ORDER — PROPOFOL 10 MG/ML IV BOLUS
INTRAVENOUS | Status: AC
  Filled 2019-11-18: qty 20

## 2019-11-18 MED ORDER — PHENYLEPHRINE HCL-NACL 10-0.9 MG/250ML-% IV SOLN
INTRAVENOUS | Status: DC | PRN
  Administered 2019-11-18: 25 ug/min via INTRAVENOUS

## 2019-11-18 MED ORDER — ONDANSETRON HCL 4 MG/2ML IJ SOLN
INTRAMUSCULAR | Status: DC | PRN
  Administered 2019-11-18: 4 mg via INTRAVENOUS

## 2019-11-18 MED ORDER — DEXAMETHASONE SODIUM PHOSPHATE 10 MG/ML IJ SOLN
INTRAMUSCULAR | Status: DC | PRN
  Administered 2019-11-18: 10 mg via INTRAVENOUS

## 2019-11-18 MED ORDER — EPHEDRINE SULFATE 50 MG/ML IJ SOLN
INTRAMUSCULAR | Status: DC | PRN
  Administered 2019-11-18: 10 mg via INTRAVENOUS

## 2019-11-18 MED ORDER — PHENYLEPHRINE 40 MCG/ML (10ML) SYRINGE FOR IV PUSH (FOR BLOOD PRESSURE SUPPORT)
PREFILLED_SYRINGE | INTRAVENOUS | Status: DC | PRN
  Administered 2019-11-18: 120 ug via INTRAVENOUS

## 2019-11-18 MED ORDER — GLUCAGON HCL RDNA (DIAGNOSTIC) 1 MG IJ SOLR
INTRAMUSCULAR | Status: AC
  Filled 2019-11-18: qty 1

## 2019-11-18 MED ORDER — ROCURONIUM BROMIDE 10 MG/ML (PF) SYRINGE
PREFILLED_SYRINGE | INTRAVENOUS | Status: DC | PRN
  Administered 2019-11-18: 40 mg via INTRAVENOUS

## 2019-11-18 MED ORDER — INDOMETHACIN 50 MG RE SUPP
RECTAL | Status: DC | PRN
  Administered 2019-11-18: 100 mg via RECTAL

## 2019-11-18 MED ORDER — FENTANYL CITRATE (PF) 250 MCG/5ML IJ SOLN
INTRAMUSCULAR | Status: AC
  Filled 2019-11-18: qty 5

## 2019-11-18 MED ORDER — SUGAMMADEX SODIUM 200 MG/2ML IV SOLN
INTRAVENOUS | Status: DC | PRN
  Administered 2019-11-18: 180 mg via INTRAVENOUS
  Administered 2019-11-18: 100 mg via INTRAVENOUS

## 2019-11-18 NOTE — Anesthesia Preprocedure Evaluation (Signed)
Anesthesia Evaluation  Patient identified by MRN, date of birth, ID band Patient awake    Reviewed: Allergy & Precautions, NPO status , Patient's Chart, lab work & pertinent test results, reviewed documented beta blocker date and time   Airway Mallampati: II  TM Distance: >3 FB Neck ROM: Full    Dental  (+) Teeth Intact, Dental Advisory Given, Edentulous Lower, Edentulous Upper   Pulmonary Current Smoker and Patient abstained from smoking.,  COVID +   Pulmonary exam normal breath sounds clear to auscultation       Cardiovascular hypertension, Pt. on home beta blockers and Pt. on medications + CAD, + Cardiac Stents and + Peripheral Vascular Disease (s/p AAA repair, aorto-iliac bypass)  Normal cardiovascular exam Rhythm:Regular Rate:Normal     Neuro/Psych negative neurological ROS     GI/Hepatic GERD  Medicated,CBD stone   Endo/Other  diabetes, Type 2, Oral Hypoglycemic Agents  Renal/GU Renal disease (AKI)     Musculoskeletal negative musculoskeletal ROS (+)   Abdominal   Peds  Hematology  (+) Blood dyscrasia, anemia ,   Anesthesia Other Findings Day of surgery medications reviewed with the patient.  Reproductive/Obstetrics                             Anesthesia Physical Anesthesia Plan  ASA: III  Anesthesia Plan: General   Post-op Pain Management:    Induction: Intravenous  PONV Risk Score and Plan: 1 and Ondansetron  Airway Management Planned: Oral ETT and Video Laryngoscope Planned  Additional Equipment:   Intra-op Plan:   Post-operative Plan: Extubation in OR  Informed Consent: I have reviewed the patients History and Physical, chart, labs and discussed the procedure including the risks, benefits and alternatives for the proposed anesthesia with the patient or authorized representative who has indicated his/her understanding and acceptance.     Dental advisory  given  Plan Discussed with: CRNA  Anesthesia Plan Comments: (Computer charting completed after induction of anesthesia due to COVID-19 positive status of patient.  Brought directly to procedural room.)        Anesthesia Quick Evaluation

## 2019-11-18 NOTE — Op Note (Signed)
[facility] Patient Name: Cody Moss Procedure Date : 11/18/2019 MRN: [id number] Attending MD: Docia Chuck. Henrene Pastor , MD Date of Birth: Sep 09, 1940 CSN: [id number] Age: 79 Admit Type: Inpatient Procedure:                ERCP with biliary sphincterotomy and common duct                            stone extraction Indications:              Bile duct stones on imaging; elevated liver tests Providers:                Docia Chuck. Henrene Pastor, MD, Glori Bickers, RN, Laverda Sorenson,                            Technician, Elmer Sow Referring MD:             [facility] hospitalist Medicines:                General Anesthesia Complications:            No immediate complications. Estimated Blood Loss:     Estimated blood loss: none. Procedure:                Pre-Anesthesia Assessment:                           - Prior to the procedure, a History and Physical                            was performed, and patient medications and                            allergies were reviewed. The patient is competent.                            The risks and benefits of the procedure and the                            sedation options and risks were discussed with the                            patient. All questions were answered and informed                            consent was obtained. Patient identification and                            proposed procedure were verified by the physician.                            Mental Status Examination: alert and oriented.                            Airway Examination: normal oropharyngeal airway and  neck mobility. Respiratory Examination: clear to                            auscultation. CV Examination: normal. Prophylactic                            Antibiotics: The patient does not require                            prophylactic antibiotics. Prior Anticoagulants: The                            patient has taken no previous anticoagulant or                             antiplatelet agents. ASA Grade Assessment: III - A                            patient with severe systemic disease. After                            reviewing the risks and benefits, the patient was                            deemed in satisfactory condition to undergo the                            procedure. The anesthesia plan was to use moderate                            sedation / analgesia (conscious sedation).                            Immediately prior to administration of medications,                            the patient was re-assessed for adequacy to receive                            sedatives. The heart rate, respiratory rate, oxygen                            saturations, blood pressure, adequacy of pulmonary                            ventilation, and response to care were monitored                            throughout the procedure. The physical status of                            the patient was re-assessed after the procedure.  After obtaining informed consent, the scope was                            passed under direct vision. Throughout the                            procedure, the patient's blood pressure, pulse, and                            oxygen saturations were monitored continuously. The                            [id number]-Q180V ([id number] Olympus Duodensocope was                            introduced through the mouth, and used to inject                            contrast into and used to cannulate the bile duct.                            The ERCP was accomplished without difficulty. The                            patient tolerated the procedure well. Scope In: Scope Out: Findings:      The esophagus was successfully intubated under direct vision. The scope       was advanced to a normal major papilla in the descending duodenum       without detailed examination of the pharynx, larynx and associated        structures, and upper GI tract. The upper GI tract was grossly normal       except for multiple superficial whitish plaques in the antrum. The major       ampulla was normal. The minor ampulla was not sought. The bile duct was       deeply cannulated with the traction (standard) sphincterotome. Contrast       was injected. I personally interpreted the bile duct images.       Opacification of the entire biliary tree was successful. The lower third       of the main bile duct contained multiple stones, the largest of which       was 7 mm in diameter. The main bile duct was diffusely dilated, with a       stone causing an obstruction. The largest diameter was 10 mm. The in the       biliary system contained multiple stones. A 0.035 inch straight standard       wire was passed into the biliary tree. Biliary sphincterotomy was made       with a sphincterotome using ERBE electrocautery. There was no       post-sphincterotomy bleeding. The biliary tree was swept with a 12 mm       balloon starting at the bifurcation. All stones were removed. No stones       remained and drainage was excellent. There was filling of the cystic       duct and gallbladder. Gallstones noted. There was no manipulation  of the       pancreatic duct or injection of the pancreatic duct Impression:               1. Choledocholithiasis status post ERC with biliary                            sphincterotomy and stone extraction                           2.. Cystic duct and gallbladder filling.                            Cholelithiasis noted                           3. No pancreatogram or manipulation of the                            pancreatic duct by intent Recommendation:           1. Standard post ERCP observation. Indomethacin                            rectal suppositories provided                           2. Elective cholecystectomy when appropriate                           3. Inpatient GI team will check up on the  patient                            tomorrow. I have shared these findings with the                            team. Procedure Code(s):        --- Professional ---                           [phone], Endoscopic retrograde                            cholangiopancreatography (ERCP); with removal of                            calculi/debris from biliary/pancreatic duct(s)                           43262, Endoscopic retrograde                            cholangiopancreatography (ERCP); with                            sphincterotomy/papillotomy Diagnosis Code(s):        --- Professional ---  K80.51, Calculus of bile duct without cholangitis                            or cholecystitis with obstruction CPT copyright 2019 [employer] Association. All rights reserved. The codes documented in this report are preliminary and upon coder review may  be revised to meet current compliance requirements. Docia Chuck. Henrene Pastor, MD 11/18/2019 4:38:16 PM This report has been signed electronically. Number of Addenda: 0

## 2019-11-18 NOTE — Transfer of Care (Signed)
Immediate Anesthesia Transfer of Care Note  Patient: Cody Moss  Procedure(s) Performed: ENDOSCOPIC RETROGRADE CHOLANGIOPANCREATOGRAPHY (ERCP) WITH PROPOFOL (N/A ) SPHINCTEROTOMY REMOVAL OF STONES  Patient Location: Endoscopy Unit  Anesthesia Type:General  Level of Consciousness: awake and alert   Airway & Oxygen Therapy: Patient Spontanous Breathing and Patient connected to face mask oxygen  Post-op Assessment: Report given to RN and Post -op Vital signs reviewed and stable  Post vital signs: Reviewed and stable  Last Vitals:  Vitals Value Taken Time  BP 148/68 11/18/19 1641  Temp    Pulse 80 11/18/19 1641  Resp 18 11/18/19 1641  SpO2 100 % 11/18/19 1641    Last Pain:  Vitals:   11/18/19 1641  TempSrc: Axillary  PainSc: 0-No pain      Patients Stated Pain Goal: 3 (53/[id number]/67 3419)  Complications: No complications documented.

## 2019-11-18 NOTE — Anesthesia Procedure Notes (Addendum)
Procedure Name: Intubation Date/Time: 11/18/2019 3:43 PM Performed by: Bryson Corona, CRNA Pre-anesthesia Checklist: Patient identified, Emergency Drugs available, Suction available and Patient being monitored Patient Re-evaluated:Patient Re-evaluated prior to induction Oxygen Delivery Method: Circle System Utilized Preoxygenation: Pre-oxygenation with 100% oxygen Induction Type: IV induction Ventilation: Mask ventilation without difficulty Laryngoscope Size: Glidescope and 4 Grade View: Grade I Tube type: Oral Tube size: 7.5 mm Number of attempts: 1 Airway Equipment and Method: Stylet and Oral airway Placement Confirmation: ETT inserted through vocal cords under direct vision,  positive ETCO2 and breath sounds checked- equal and bilateral Secured at: 24 cm Tube secured with: Tape Dental Injury: Teeth and Oropharynx as per pre-operative assessment
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
## 2019-11-18 NOTE — Progress Notes (Signed)
Inpatient Diabetes Program Recommendations  AACE/ADA: New Consensus Statement on Inpatient Glycemic Control (2015)  Target Ranges:  Prepandial:   less than 140 mg/dL      Peak postprandial:   less than 180 mg/dL (1-2 hours)      Critically ill patients:  140 - 180 mg/dL   Lab Results  Component Value Date   GLUCAP 191 (H) 11/18/2019   HGBA1C 7.5 (H) 11/15/2019    Review of Glycemic Control Results for Cody Moss, Cody Moss" (MRN [id number]) as of 11/18/2019 09:30  Ref. Range 11/17/2019 15:46 11/17/2019 20:41 11/17/2019 23:24 11/18/2019 08:06  Glucose-Capillary Latest Ref Range: 70 - 99 mg/dL 82 229 (H) 260 (H) 191 (H)   Diabetes history: Type 2 DM Outpatient Diabetes medications: Metformin 500 mg BID Current orders for Inpatient glycemic control: Novolog 0-20 units TID Decadron 6 mg QD  Inpatient Diabetes Program Recommendations:    Assuming glucose trends elevated due to steroids. If post prandials continue to exceed 180's mg/dL, may want to consider adding Novolog 3 units TID (assuming patient is consuming >50% of meals). Noted NPO at 1000 for ERCP, order would be following procedure when appropriate.   Thanks, Bronson Curb, MSN, RNC-OB Diabetes Coordinator [phone] (8a-5p)

## 2019-11-18 NOTE — Anesthesia Postprocedure Evaluation (Signed)
Anesthesia Post Note  Patient: Cody Moss  Procedure(s) Performed: ENDOSCOPIC RETROGRADE CHOLANGIOPANCREATOGRAPHY (ERCP) WITH PROPOFOL (N/A ) SPHINCTEROTOMY REMOVAL OF STONES     Patient location during evaluation: Endoscopy Anesthesia Type: General Level of consciousness: awake and alert Pain management: pain level controlled Vital Signs Assessment: post-procedure vital signs reviewed and stable Respiratory status: spontaneous breathing, nonlabored ventilation, respiratory function stable and patient connected to nasal cannula oxygen Cardiovascular status: blood pressure returned to baseline and stable Postop Assessment: no apparent nausea or vomiting Anesthetic complications: no   No complications documented.  Last Vitals:  Vitals:   11/18/19 1653 11/18/19 1703  BP: (!) 161/69 (!) 174/71  Pulse: 55 53  Resp: 18 17  Temp:    SpO2: 99% 93%    Last Pain:  Vitals:   11/18/19 2000  TempSrc:   PainSc: 2                  Catalina Gravel

## 2019-11-18 NOTE — Progress Notes (Signed)
PROGRESS NOTE                                                                                                                                                                                                             Patient Demographics:    Cody Moss, is a 79 y.o. male, DOB - [birth year], RXV:[id number]  Outpatient Primary MD for the patient is [name], MD    LOS - 3  Admit date - 11/15/2019    Chief Complaint  Patient presents with  . Weakness       Brief Narrative  [name] is a 79 y.o. male with medical history significant of HTN, DM on oral meds, h/o Lumbar spine surgery, AAA repair, pulmonary fibrosis, tobacco abuse, CAD s/p PCI/Stent. He reports that about 1 week ago he had an episode of abdominal pain located left abdomen that resolved spontaneously. On the day of admission he awoke with moderate to severe abdominal pain, located primarily at Left upper quadrant and left flank accompanied by N/V.  His work-up in the ER showed a incidental COVID-19 infection, cavitary lung lesion with evidence of bacterial infection and elevated procalcitonin, possible calculus cholecystitis.   Subjective:   Patient in bed, appears comfortable, denies any headache, no fever, no chest pain or pressure, reports his cough has improved, he denies any dyspnea .    Assessment  & Plan :   Bacterial pneumonia with cavitary lung lesion  -Patient's lung findings are more likely related to posterior bacterial component rather than COVID-19 infection, his procalcitonin is extremely high and so is his CRP, he has been started on appropriate antibiotics, negative HIV, pending gold QuantiFERON for TB along with sputum Gram stain culture and blood cultures.  - In by pulmonary for cavitary lung lesion recommend appropriate antibiotic treatment for 2 weeks thereafter follow-up with Dr. Melvyn Novas in the office.  Patient has seen Dr.  Melvyn Novas before.  For now continue azithromycin and Zosyn.  Upon discharge can switch to oral forms.  Total of 2 weeks.   COVID-19 infection -With no evidence of COVID-19 pneumonia, for now low-dose steroid and remdesivir will be continued.  I think this is not causing him any symptoms at this time. - Encouraged the patient to sit up in chair in the daytime use I-S and flutter valve for pulmonary toiletry and  then prone in bed when at night.  Will advance activity and titrate down oxygen as possible.   SpO2: 95 % O2 Flow Rate (L/min): 2 L/min  Recent Labs  Lab 11/15/19 1655 11/15/19 1709 11/15/19 2106 11/16/19 0758 11/17/19 0436 11/18/19 0453  WBC  --  7.5  --  8.1 6.4 6.3  PLT  --  232  --  242 247 239  CRP  --   --  4.7* 10.2* 10.5* 6.7*  DDIMER  --   --  2.87* 2.90* 2.35* 1.88*  PROCALCITON  --   --  38.17 43.37 37.29 22.39  LATICACIDVEN  --  4.5* 2.4*  --   --   --   SARSCOV2NAA POSITIVE*  --   --   --   --   --     Hepatic Function Latest Ref Rng & Units 11/18/2019 11/17/2019 11/15/2019  Total Protein 6.5 - 8.1 g/dL 5.9(L) 5.9(L) 5.6(L)  Albumin 3.5 - 5.0 g/dL 2.8(L) 2.8(L) 2.9(L)  AST 15 - 41 U/L 52(H) 144(H) 274(H)  ALT 0 - 44 U/L 72(H) 112(H) 104(H)  Alk Phosphatase 38 - 126 U/L 67 74 80  Total Bilirubin 0.3 - 1.2 mg/dL 0.6 1.0 1.9(H)  Bilirubin, Direct 0.1 - 0.5 mg/dL - - -    Severe sepsis present on admission secondary to E. coli bacteremia/cholangitis  - sepsis pathophysiology has resolved  -His work-up significant for choledocholithiasis, E. coli bacteremia/cholangitis, GI input greatly appreciated, then for ERCP today with Dr. Henrene Pastor with sphincterotomy and stone extraction . -Continue with IV antibiotic coverage including IV Zosyn . - With ultrasound and CT scan suggestive of calculus cholecystitis, LFTs were high MRCP was ordered which shows possible CBD stone, GI general surgery both consulted.  History of smoking.  NicoDerm patch.  Counseled to quit  smoking.  Chronic low back pain.  Home dose narcotic.  BPH.  On Proscar.  GERD.  On PPI.  E. coli bacteremia.  Most likely source could be gallbladder however cavitary lung lesion there as well, continue Zosyn.  History of essential hypertension.  Currently blood pressure soft.  Hydrate and monitor.   AKI on CKD 3.  Baseline creatinine around 1.4, hold ARB, IVF, Renal US.    Condition - Extremely Guarded  Family Communication  :   Wife [name] - [phone] on 11/16/19, daughter Lenna Sciara -  [phone] on 11/16/19, 11/17/19, will update later today.   Code Status :  Full  Consults  :  PCCM, CCS  Procedures  :    HIDA -   Renal US -  MRCP -   1. Choledocholithiasis with mild biliary duct dilation as described. 2. Pericholecystic stranding and signs of wall thickening, remaining suspicious for acute cholecystitis. If there are discordant clinical findings, HIDA scan may be helpful for further assessment. 3. Renal sinus cysts and stable collecting system dilation on the LEFT with renal cysts on the RIGHT and mild perinephric stranding, nonspecific and similar to previous imaging studies. 4. Small effusions at the lung bases. 5. Mild hepatic steatosis.   RUQ [place] - 1. Gallbladder sludge and stones with diffuse edema as seen on the earlier CT. A hepatobiliary scintigraphy may provide better evaluation of the gallbladder if there is a high clinical concern for acute cholecystitis . 2. Fatty liver.   CT - 1. A 2.6 x 2.7 cm cavitary nodule in the superior segment of the right lower lobe may represent an abscess, an infected pneumatocele. Fungal infection, TB or cavitary neoplasm are  not excluded. Clinical correlation and follow-up to resolution is recommended. 2. Emphysema with slight progression of the interstitial lung disease since the prior CT. 3. Fatty liver with possible early changes of cirrhosis. Elastography may provide better evaluation. 4. Gallbladder tumefactive sludge  versus noncalcified stones. Diffuse gallbladder wall thickening and small pericholecystic fluid may be related to underlying liver disease although acute cholecystitis is not excluded. Further evaluation with right upper quadrant ultrasound recommended. 5. Severe sigmoid.   PUD Prophylaxis : PPI  Disposition Plan  :    Status is: Inpatient  Remains inpatient appropriate because:IV treatments appropriate due to intensity of illness or inability to take PO   Dispo: The patient is from: Home              Anticipated d/c is to: Home              Anticipated d/c date is: > 3 days              Patient currently is not medically stable to d/c.   DVT Prophylaxis  :  Lovenox   Lab Results  Component Value Date   PLT 239 11/18/2019    Diet :  Diet Order            Diet NPO time specified  Diet effective 1000                  Inpatient Medications  Scheduled Meds: . allopurinol  300 mg Oral Daily  . aspirin  325 mg Oral Daily  . bicalutamide  50 mg Oral QHS  . Chlorhexidine Gluconate Cloth  6 each Topical Daily  . dexamethasone  6 mg Oral Q24H  . [START ON 11/19/2019] enoxaparin (LOVENOX) injection  40 mg Subcutaneous Q24H  . fenofibrate  160 mg Oral QPM  . finasteride  5 mg Oral QHS  . insulin aspart  0-20 Units Subcutaneous TID WC  . nicotine  21 mg Transdermal Daily   Continuous Infusions: . azithromycin 500 mg (11/17/19 1519)  . lactated ringers 50 mL/hr at 11/18/19 0842  . piperacillin-tazobactam (ZOSYN)  IV 3.375 g (11/18/19 0842)  . remdesivir 100 mg in NS 100 mL Stopped (11/17/19 0917)   PRN Meds:.acetaminophen, hydrALAZINE, morphine injection, ondansetron (ZOFRAN) IV, oxyCODONE-acetaminophen  Antibiotics  :    Anti-infectives (From admission, onward)   Start     Dose/Rate Route Frequency Ordered Stop   11/16/19 2000  azithromycin (ZITHROMAX) 500 mg in sodium chloride 0.9 % 250 mL IVPB  Status:  Discontinued        500 mg 250 mL/hr over 60 Minutes  Intravenous Every 24 hours 11/16/19 0028 11/16/19 0923   11/16/19 1230  azithromycin (ZITHROMAX) 500 mg in sodium chloride 0.9 % 250 mL IVPB     Discontinue     500 mg 250 mL/hr over 60 Minutes Intravenous Every 24 hours 11/16/19 1121     11/16/19 1000  remdesivir 100 mg in sodium chloride 0.9 % 100 mL IVPB     Discontinue    "Followed by" Linked Group Details   100 mg 200 mL/hr over 30 Minutes Intravenous Daily 11/15/19 1913 11/20/19 0959   11/16/19 0000  piperacillin-tazobactam (ZOSYN) IVPB 3.375 g     Discontinue     3.375 g 12.5 mL/hr over 240 Minutes Intravenous Every 8 hours 11/15/19 2342     11/15/19 2345  piperacillin-tazobactam (ZOSYN) IVPB 3.375 g  Status:  Discontinued        3.375 g 100 mL/hr  over 30 Minutes Intravenous Every 8 hours 11/15/19 2340 11/15/19 2341   11/15/19 2000  remdesivir 200 mg in sodium chloride 0.9% 250 mL IVPB       "Followed by" Linked Group Details   200 mg 580 mL/hr over 30 Minutes Intravenous Once 11/15/19 1913 11/15/19 2220   11/15/19 1730  azithromycin (ZITHROMAX) 500 mg in sodium chloride 0.9 % 250 mL IVPB        500 mg 250 mL/hr over 60 Minutes Intravenous  Once 11/15/19 1723 11/15/19 1915   11/15/19 1700  piperacillin-tazobactam (ZOSYN) IVPB 3.375 g        3.375 g 100 mL/hr over 30 Minutes Intravenous  Once 11/15/19 1650 11/15/19 1739       Time Spent in minutes  30   Phillips Climes M.D on 11/18/2019 at 9:52 AM  To page go to [email or web address] - password [place]  [facility] -  Office  [phone]    See all Orders from today for further details    Objective:   Vitals:   11/17/19 2041 11/17/19 2304 11/18/19 0439 11/18/19 0808  BP: (!) 147/77  (!) 151/66 (!) 151/84  Pulse: 65 56 51 62  Resp: (!) '24 17 22 23  ' Temp: (!) 97.5 F (36.4 C)  98 F (36.7 C) 97.6 F (36.4 C)  TempSrc: Oral  Oral Oral  SpO2: 95% 93% 94% 95%  Weight:      Height:        Wt Readings from Last 3 Encounters:  11/16/19 85.2 kg  08/12/19 87.5 kg   05/11/19 87.1 kg     Intake/Output Summary (Last 24 hours) at 11/18/2019 0952 Last data filed at 11/18/2019 0511 Gross per 24 hour  Intake 1241.96 ml  Output 600 ml  Net 641.96 ml     Physical Exam  Awake Alert, Oriented X 3, No new F.N deficits, Normal affect Symmetrical Chest wall movement, Good air movement bilaterally, CTAB RRR,No Gallops,Rubs or new Murmurs, No Parasternal Heave +ve B.Sounds, Abd Soft, mild RUQ  tenderness, No rebound - guarding or rigidity. No Cyanosis, Clubbing or edema, No new Rash or bruise        Data Review:    CBC Recent Labs  Lab 11/15/19 1709 11/16/19 0758 11/17/19 0436 11/18/19 0453  WBC 7.5 8.1 6.4 6.3  HGB 9.8* 8.6* 8.6* 8.5*  HCT 30.3* 28.0* 27.3* 26.3*  PLT 232 242 247 239  MCV 99.3 99.3 99.3 96.7  MCH 32.1 30.5 31.3 31.3  MCHC 32.3 30.7 31.5 32.3  RDW 14.3 14.6 14.9 14.9  LYMPHSABS 0.2*  --  0.7 0.5*  MONOABS 0.5  --  0.3 0.3  EOSABS 0.0  --  0.0 0.0  BASOSABS 0.0  --  0.0 0.0    Chemistries  Recent Labs  Lab 11/15/19 1709 11/15/19 1717 11/16/19 0354 11/16/19 0758 11/17/19 0436 11/18/19 0453  NA 136  --  138  --  139 134*  K 3.6  --  4.2  --  4.4 4.3  CL 104  --  107  --  107 102  CO2 19*  --  24  --  24 21*  GLUCOSE 229*  --  187*  --  199* 301*  BUN 21  --  17  --  23 24*  CREATININE 1.47*  --  1.36*  --  1.64* 1.47*  CALCIUM 8.3*  --  8.2*  --  8.7* 8.5*  AST 274*  --   --   --  144*  52*  ALT 104*  --   --   --  112* 72*  ALKPHOS 80  --   --   --  74 67  BILITOT 1.9*  --   --   --  1.0 0.6  MG  --   --   --  1.8 2.2 2.1  INR 1.1  --   --   --   --   --   HGBA1C  --  7.5*  --   --   --   --      ------------------------------------------------------------------------------------------------------------------ Recent Labs    11/15/19 2106  TRIG 199*    Lab Results  Component Value Date   HGBA1C 7.5 (H) 11/15/2019    ------------------------------------------------------------------------------------------------------------------ No results for input(s): TSH, T4TOTAL, T3FREE, THYROIDAB in the last 72 hours.  Invalid input(s): FREET3  Cardiac Enzymes No results for input(s): CKMB, TROPONINI, MYOGLOBIN in the last 168 hours.  Invalid input(s): CK ------------------------------------------------------------------------------------------------------------------    Component Value Date/Time   BNP 658.2 (H) 11/18/2019 0453    Micro Results Recent Results (from the past 240 hour(s))  SARS Coronavirus 2 by RT PCR (hospital order, performed in [facility] hospital lab) Nasopharyngeal Nasopharyngeal Swab     Status: Abnormal   Collection Time: 11/15/19  4:55 PM   Specimen: Nasopharyngeal Swab  Result Value Ref Range Status   SARS Coronavirus 2 POSITIVE (A) NEGATIVE Final    Comment: RESULT CALLED TO, READ BACK BY AND VERIFIED WITH: RN MEEKS [id number] FCP (NOTE) SARS-CoV-2 target nucleic acids are DETECTED  SARS-CoV-2 RNA is generally detectable in upper respiratory specimens  during the acute phase of infection.  Positive results are indicative  of the presence of the identified virus, but do not rule out bacterial infection or co-infection with other pathogens not detected by the test.  Clinical correlation with patient history and  other diagnostic information is necessary to determine patient infection status.  The expected result is negative.  Fact Sheet for Patients:   StrictlyIdeas.no   Fact Sheet for Healthcare Providers:   BankingDealers.co.za    This test is not yet approved or cleared by the [place] FDA and  has been authorized for detection and/or diagnosis of SARS-CoV-2 by FDA under an Emergency Use Authorization (EUA).  This EUA will remain in effect (meaning this test can be  used) for the duration of  the COVID-19  declaration under Section 564(b)(1) of the Act, 21 U.S.C. section 360-bbb-3(b)(1), unless the authorization is terminated or revoked sooner.  Performed at [facility], [place] [address] [place], [place] [address]   Blood Culture (routine x 2)     Status: None (Preliminary result)   Collection Time: 11/15/19  5:00 PM   Specimen: BLOOD RIGHT WRIST  Result Value Ref Range Status   Specimen Description BLOOD RIGHT WRIST  Final   Special Requests   Final    BOTTLES DRAWN AEROBIC AND ANAEROBIC Blood Culture adequate volume   Culture   Final    NO GROWTH 2 DAYS Performed at [facility], [place] [address], [place], [place] [address]    Report Status PENDING  Incomplete  Blood Culture (routine x 2)     Status: Abnormal   Collection Time: 11/15/19  5:09 PM   Specimen: BLOOD  Result Value Ref Range Status   Specimen Description BLOOD RIGHT ANTECUBITAL  Final   Special Requests   Final    BOTTLES DRAWN AEROBIC AND ANAEROBIC Blood Culture results may  not be optimal due to an excessive volume of blood received in culture bottles   Culture  Setup Time   Final    GRAM NEGATIVE RODS ANAEROBIC BOTTLE ONLY Organism ID to follow CRITICAL RESULT CALLED TO, READ BACK BY AND VERIFIED WITH: Sarajane Marek [id number] [phone] MLM Performed at [facility], [place] [address] [place], [place] [address]    Culture ESCHERICHIA COLI (A)  Final   Report Status 11/18/2019 FINAL  Final   Organism ID, Bacteria ESCHERICHIA COLI  Final      Susceptibility   Escherichia coli - MIC*    AMPICILLIN >=32 RESISTANT Resistant     CEFAZOLIN <=4 SENSITIVE Sensitive     CEFEPIME <=0.12 SENSITIVE Sensitive     CEFTAZIDIME <=1 SENSITIVE Sensitive     CEFTRIAXONE <=0.25 SENSITIVE Sensitive     CIPROFLOXACIN 0.5 SENSITIVE Sensitive     GENTAMICIN <=1 SENSITIVE Sensitive     IMIPENEM <=0.25 SENSITIVE Sensitive     TRIMETH/SULFA >=320 RESISTANT Resistant     AMPICILLIN/SULBACTAM 8 SENSITIVE Sensitive     PIP/TAZO  <=4 SENSITIVE Sensitive     * ESCHERICHIA COLI  Blood Culture ID Panel (Reflexed)     Status: Abnormal   Collection Time: 11/15/19  5:09 PM  Result Value Ref Range Status   Enterococcus species NOT DETECTED NOT DETECTED Final   Listeria monocytogenes NOT DETECTED NOT DETECTED Final   Staphylococcus species NOT DETECTED NOT DETECTED Final   Staphylococcus aureus (BCID) NOT DETECTED NOT DETECTED Final   Streptococcus species NOT DETECTED NOT DETECTED Final   Streptococcus agalactiae NOT DETECTED NOT DETECTED Final   Streptococcus pneumoniae NOT DETECTED NOT DETECTED Final   Streptococcus pyogenes NOT DETECTED NOT DETECTED Final   Acinetobacter baumannii NOT DETECTED NOT DETECTED Final   Enterobacteriaceae species DETECTED (A) NOT DETECTED Final    Comment: Enterobacteriaceae represent a large family of gram-negative bacteria, not a single organism. CRITICAL RESULT CALLED TO, READ BACK BY AND VERIFIED WITH: PHARMD J FRENS [id number] MLM    Enterobacter cloacae complex NOT DETECTED NOT DETECTED Final   Escherichia coli DETECTED (A) NOT DETECTED Final    Comment: CRITICAL RESULT CALLED TO, READ BACK BY AND VERIFIED WITH: PHARMD J FRENS [id number] MLM    Klebsiella oxytoca NOT DETECTED NOT DETECTED Final   Klebsiella pneumoniae NOT DETECTED NOT DETECTED Final   Proteus species NOT DETECTED NOT DETECTED Final   Serratia marcescens NOT DETECTED NOT DETECTED Final   Carbapenem resistance NOT DETECTED NOT DETECTED Final   Haemophilus influenzae NOT DETECTED NOT DETECTED Final   Neisseria meningitidis NOT DETECTED NOT DETECTED Final   Pseudomonas aeruginosa NOT DETECTED NOT DETECTED Final   Candida albicans NOT DETECTED NOT DETECTED Final   Candida glabrata NOT DETECTED NOT DETECTED Final   Candida krusei NOT DETECTED NOT DETECTED Final   Candida parapsilosis NOT DETECTED NOT DETECTED Final   Candida tropicalis NOT DETECTED NOT DETECTED Final    Comment: Performed at [place], [place]. [address] [place], [place] [address]  Urine culture     Status: None   Collection Time: 11/15/19 10:46 PM   Specimen: Urine, Random  Result Value Ref Range Status   Specimen Description URINE, RANDOM  Final   Special Requests NONE  Final   Culture   Final    NO GROWTH Performed at [facility], [place] [address] [place], [place] [address]    Report Status 11/16/2019 FINAL  Final  MRSA  PCR Screening     Status: None   Collection Time: 11/16/19  2:30 AM   Specimen: Nasal Mucosa; Nasopharyngeal  Result Value Ref Range Status   MRSA by PCR NEGATIVE NEGATIVE Final    Comment:        The GeneXpert MRSA Assay (FDA approved for NASAL specimens only), is one component of a comprehensive MRSA colonization surveillance program. It is not intended to diagnose MRSA infection nor to guide or monitor treatment for MRSA infections. Performed at [facility], [place] [address] [place], [place] [address]     Radiology Reports CT Chest W Contrast  Result Date: 11/15/2019 CLINICAL DATA:  79 year old male with pneumonia. Concern for abscess or effusion. EXAM: CT CHEST, ABDOMEN, AND PELVIS WITH CONTRAST TECHNIQUE: Multidetector CT imaging of the chest, abdomen and pelvis was performed following the standard protocol during bolus administration of intravenous contrast. CONTRAST:  171m OMNIPAQUE IOHEXOL 300 MG/ML  SOLN COMPARISON:  Chest CT dated 02/14/2018. Chest radiograph dated 11/15/2019. FINDINGS: CT CHEST FINDINGS Cardiovascular: There is no cardiomegaly or pericardial effusion. Advanced 3 vessel coronary vascular calcification. There is advanced calcified and noncalcified plaque of the thoracic aorta. No aneurysmal dilatation or dissection. The central pulmonary arteries appear patent. Mediastinum/Nodes: Right hilar adenopathy measuring 13 mm. Subcarinal lymph node measures 10 mm in short axis. The esophagus and the thyroid gland are grossly unremarkable. No mediastinal fluid  collection. Lungs/Pleura: There is background of emphysema. Bibasilar subpleural reticulation as seen on the prior CT in keeping with interstitial lung disease. Overall slight progression of the fibrotic changes since the prior CT. There is a 2.6 x 2.7 cm cavitary nodule in the superior segment of the right lower lobe which may represent an abscess, an infected pneumatocele. Fungal infection, TB or cavitary neoplasm are not excluded. Clinical correlation and follow-up to resolution is recommended. Several scattered bilateral lower lobe nodular densities, present on the prior CT. No lobar consolidation, pleural effusion, or pneumothorax. The central airways are patent. Musculoskeletal: No chest wall mass or suspicious bone lesions identified. CT ABDOMEN PELVIS FINDINGS No intra-abdominal free air or free fluid. Hepatobiliary: There is fatty infiltration of the liver. Slight irregularity of the liver contour may represent early changes of cirrhosis. Elastography may provide better evaluation. There is tumefactive sludge versus noncalcified stones within the gallbladder. There is diffuse gallbladder wall thickening and small pericholecystic fluid. Findings may be related to underlying liver disease although acute cholecystitis is not excluded. Further evaluation with right upper quadrant ultrasound recommended. Pancreas: Unremarkable. No pancreatic ductal dilatation or surrounding inflammatory changes. Spleen: Normal in size without focal abnormality. Adrenals/Urinary Tract: The adrenal glands are unremarkable. Multiple bilateral renal cysts with the largest a lobulated appearing left renal interpolar cyst measuring up to 13 cm in length. Several additional subcentimeter hypodensities are too small to characterize. There is no hydronephrosis on either side. There is symmetric enhancement and excretion of contrast by both kidneys. Mild bilateral perinephric stranding, nonspecific. Correlation with urinalysis recommended  to exclude UTI. The visualized ureters and urinary bladder appear unremarkable. Stomach/Bowel: There is severe sigmoid diverticulosis without active inflammatory changes. There is no bowel obstruction or active inflammation. The appendix is normal. Vascular/Lymphatic: There is advanced aortoiliac atherosclerotic disease. 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 The graft appears patent. There is advanced atherosclerotic calcification of the common femoral arteries bilaterally, right greater left. The IVC is unremarkable. No portal venous gas. There is no adenopathy. Reproductive: Prostatectomy. 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 Musculoskeletal: There is degenerative changes of the spine. No acute osseous pathology. IMPRESSION: 1. A 2.6 x 2.7 cm cavitary nodule in the superior segment of the right lower lobe may represent an abscess, an infected pneumatocele. Fungal infection, TB or cavitary neoplasm are not excluded. Clinical correlation and follow-up to resolution is recommended. 2. Emphysema with slight progression of the interstitial lung disease since the prior CT. 3. Fatty liver with possible early changes of cirrhosis. Elastography may provide better evaluation. 4. Gallbladder tumefactive sludge versus noncalcified stones. Diffuse gallbladder wall thickening and small pericholecystic fluid may be related to underlying liver disease although acute cholecystitis is not excluded. Further evaluation with right upper quadrant ultrasound recommended. 5. Severe sigmoid diverticulosis. No bowel obstruction. Normal appendix. 6. Aortic Atherosclerosis (ICD10-I70.0) and Emphysema (ICD10-J43.9). Electronically Signed   By: Anner Crete M.D.   On: 11/15/2019 19:11   NM Hepatobiliary Liver Func  Result Date: 11/17/2019 CLINICAL DATA:  Nausea and vomiting. Cholelithiasis and choledocholithiasis. EXAM: NUCLEAR MEDICINE HEPATOBILIARY  IMAGING TECHNIQUE: Sequential images of the abdomen were obtained out to 60 minutes following intravenous administration of radiopharmaceutical. RADIOPHARMACEUTICALS:  5.5 mCi Tc-55m Choletec IV COMPARISON:  None. FINDINGS: Prompt uptake and biliary excretion of activity by the liver is seen. Gallbladder activity is visualized, consistent with patency of cystic duct. Biliary activity passes into small bowel, consistent with patent common bile duct. Reflux of biliary activity into the stomach is noted. IMPRESSION: Patency of both cystic and common bile ducts is demonstrated. Bile reflux noted. Electronically Signed   By: JMarlaine HindM.D.   On: 11/17/2019 15:15   CT Abdomen Pelvis W Contrast  Result Date: 11/15/2019 CLINICAL DATA:  79year old male with pneumonia. Concern for abscess or effusion. EXAM: CT CHEST, ABDOMEN, AND PELVIS WITH CONTRAST TECHNIQUE: Multidetector CT imaging of the chest, abdomen and pelvis was performed following the standard protocol during bolus administration of intravenous contrast. CONTRAST:  1057mOMNIPAQUE IOHEXOL 300 MG/ML  SOLN COMPARISON:  Chest CT dated 02/14/2018. Chest radiograph dated 11/15/2019. FINDINGS: CT CHEST FINDINGS Cardiovascular: There is no cardiomegaly or pericardial effusion. Advanced 3 vessel coronary vascular calcification. There is advanced calcified and noncalcified plaque of the thoracic aorta. No aneurysmal dilatation or dissection. The central pulmonary arteries appear patent. Mediastinum/Nodes: Right hilar adenopathy measuring 13 mm. Subcarinal lymph node measures 10 mm in short axis. The esophagus and the thyroid gland are grossly unremarkable. No mediastinal fluid collection. Lungs/Pleura: There is background of emphysema. Bibasilar subpleural reticulation as seen on the prior CT in keeping with interstitial lung disease. Overall slight progression of the fibrotic changes since the prior CT. There is a 2.6 x 2.7 cm cavitary nodule in the superior  segment of the right lower lobe which may represent an abscess, an infected pneumatocele. Fungal infection, TB or cavitary neoplasm are not excluded. Clinical correlation and follow-up to resolution is recommended. Several scattered bilateral lower lobe nodular densities, present on the prior CT. No lobar consolidation, pleural effusion, or pneumothorax. The central airways are patent. Musculoskeletal: No chest wall mass or suspicious bone lesions identified. CT ABDOMEN PELVIS FINDINGS No intra-abdominal free air or free fluid. Hepatobiliary: There is fatty infiltration of the liver. Slight irregularity of the liver contour may represent early changes of cirrhosis. Elastography may provide better evaluation. There is tumefactive sludge versus noncalcified stones within the gallbladder. There is diffuse gallbladder wall thickening and small pericholecystic fluid. Findings may be related to underlying liver disease although acute  cholecystitis is not excluded. Further evaluation with right upper quadrant ultrasound recommended. Pancreas: Unremarkable. No pancreatic ductal dilatation or surrounding inflammatory changes. Spleen: Normal in size without focal abnormality. Adrenals/Urinary Tract: The adrenal glands are unremarkable. Multiple bilateral renal cysts with the largest a lobulated appearing left renal interpolar cyst measuring up to 13 cm in length. Several additional subcentimeter hypodensities are too small to characterize. There is no hydronephrosis on either side. There is symmetric enhancement and excretion of contrast by both kidneys. Mild bilateral perinephric stranding, nonspecific. Correlation with urinalysis recommended to exclude UTI. The visualized ureters and urinary bladder appear unremarkable. Stomach/Bowel: There is severe sigmoid diverticulosis without active inflammatory changes. There is no bowel obstruction or active inflammation. The appendix is normal. Vascular/Lymphatic: There is advanced  aortoiliac atherosclerotic disease. 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 The graft appears patent. There is advanced atherosclerotic calcification of the common femoral arteries bilaterally, right greater left. The IVC is unremarkable. No portal venous gas. There is no adenopathy. Reproductive: Prostatectomy. 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 Musculoskeletal: There is degenerative changes of the spine. No acute osseous pathology. IMPRESSION: 1. A 2.6 x 2.7 cm cavitary nodule in the superior segment of the right lower lobe may represent an abscess, an infected pneumatocele. Fungal infection, TB or cavitary neoplasm are not excluded. Clinical correlation and follow-up to resolution is recommended. 2. Emphysema with slight progression of the interstitial lung disease since the prior CT. 3. Fatty liver with possible early changes of cirrhosis. Elastography may provide better evaluation. 4. Gallbladder tumefactive sludge versus noncalcified stones. Diffuse gallbladder wall thickening and small pericholecystic fluid may be related to underlying liver disease although acute cholecystitis is not excluded. Further evaluation with right upper quadrant ultrasound recommended. 5. Severe sigmoid diverticulosis. No bowel obstruction. Normal appendix. 6. Aortic Atherosclerosis (ICD10-I70.0) and Emphysema (ICD10-J43.9). Electronically Signed   By: Anner Crete M.D.   On: 11/15/2019 19:11   US RENAL  Result Date: 11/18/2019 CLINICAL DATA:  Acute renal injury, COVID-19 positivity EXAM: RENAL / URINARY TRACT ULTRASOUND COMPLETE COMPARISON:  11/15/2019 FINDINGS: Right Kidney: Renal measurements: 12.3 x 5.1 x 4.3 cm. = volume: 140 mL. Less than 10 cysts are noted. The largest of these measures 2.7 cm in the midportion of the right kidney. Left Kidney: Renal measurements: 14.3 x 5.7 x 5.5 cm. = volume: 233 mL. Less than 10 cysts are noted.  The largest of these measures 7.3 cm in greatest dimension. These are stable from the prior CT examination Bladder: Appears normal for degree of bladder distention. Other: Gallbladder is partially distended with echogenic material within similar to that seen on prior exam consistent with gallbladder sludge and stones. IMPRESSION: Gallbladder sludge and stones. Bilateral renal cysts which are simple in nature. No obstructive changes are noted. Electronically Signed   By: Inez Catalina M.D.   On: 11/18/2019 03:03   MR ABDOMEN MRCP WO CONTRAST  Result Date: 11/17/2019 CLINICAL DATA:  Cholelithiasis EXAM: MRI ABDOMEN WITHOUT CONTRAST  (INCLUDING MRCP) TECHNIQUE: Multiplanar multisequence MR imaging of the abdomen was performed. Heavily T2-weighted images of the biliary and pancreatic ducts were obtained, and three-dimensional MRCP images were rendered by post processing. COMPARISON:  CT chest, abdomen and pelvis of November 15, 2019 FINDINGS: Lower chest: Small effusions at the lung bases. As basilar airspace disease and parenchymal findings demonstrated on the recent chest CT are not well evaluated on the MRI. Hepatobiliary: Mild hepatic steatosis.  No focal, suspicious hepatic lesion, assessment limited by the noncontrast imaging. Pericholecystic stranding and signs of wall thickening. Mild biliary duct distension with numerous filling defects in the common bile duct compatible with multiple small stones. Largest approximately 5 mm. At least 4-5 additional small biliary calculi in the common bile duct. Pancreas:  No ductal dilation.  No peripancreatic inflammation. Spleen: Skin normal in size and contour without focal, suspicious lesion. Adrenals/Urinary Tract:  Adrenal glands are normal spleen. Bilateral renal cysts. Asymmetric albeit slightly Peri nephric stranding greater on the LEFT than the RIGHT. Dilated collecting systems in the upper pole and in the lower pole LEFT kidney perhaps due to mass-effect upon  collecting systems in the setting of large renal sinus cysts with similar appearance to prior imaging studies. Stomach/Bowel: Stomach gastrointestinal tract with limited assessment, unremarkable to the extent evaluated. 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 Other:  None. Musculoskeletal: No suspicious bone lesions identified. IMPRESSION: 1. Choledocholithiasis with mild biliary duct dilation as described. 2. Pericholecystic stranding and signs of wall thickening, remaining suspicious for acute cholecystitis. If there are discordant clinical findings, HIDA scan may be helpful for further assessment. 3. Renal sinus cysts and stable collecting system dilation on the LEFT with renal cysts on the RIGHT and mild perinephric stranding, nonspecific and similar to previous imaging studies. 4. Small effusions at the lung bases. 5. Mild hepatic steatosis. Electronically Signed   By: Zetta Bills M.D.   On: 11/17/2019 08:06   DG Chest Port 1 View  Result Date: 11/16/2019 CLINICAL DATA:  Shortness of breath and fever EXAM: PORTABLE CHEST 1 VIEW COMPARISON:  Chest radiograph and chest CT November 15, 2019 FINDINGS: There is a degree of underlying fibrosis. The cavitary lesion in the right lower lobe seen 1 day prior is less well seen by radiography although is evident, measuring 2.5 x 2.4 cm. There is a degree of lower lobe bronchiectatic change. No new opacity evident. Heart size and pulmonary vascularity are normal. Lymph node prominence seen on CT is not appreciable by radiography. There is aortic atherosclerosis. No bone lesions appreciable. IMPRESSION: Cavitary lesion right lower lobe, much better seen on recent CT. This nodular lesion measures 2.5 x 2.4 cm on portable radiographic examination. There is an underlying degree of fibrosis in the bases. No new opacity evident. Cardiac silhouette stable. Aortic Atherosclerosis (ICD10-I70.0).  Electronically Signed   By: Lowella Grip III M.D.   On: 11/16/2019 09:34   DG Chest Port 1 View  Result Date: 11/15/2019 CLINICAL DATA:  Evaluate for infection. EXAM: PORTABLE CHEST 1 VIEW COMPARISON:  07/27/2015 FINDINGS: Lungs are adequately inflated without effusion or pneumothorax. Mild increased density over the right hilum/perihilar region. Cardiomediastinal silhouette and remainder the exam is unchanged. IMPRESSION: Minimal increased density over the right hilar/perihilar region. Medial airspace process is possible. Consider PA and lateral chest radiograph for better evaluation. Electronically Signed   By: Marin Olp M.D.   On: 11/15/2019 17:04   ECHOCARDIOGRAM LIMITED  Result Date: 11/16/2019    ECHOCARDIOGRAM LIMITED REPORT   Patient Name:   CHASTIN RIESGO Date of Exam: 11/16/2019 Medical Rec #:  [id number]         Height:       70.0 in Accession #:    [id number]        Weight:       187.8 lb Date of Birth:  10-10-1940         BSA:  2.032 m Patient Age:    48 years          BP:           103/63 mmHg Patient Gender: M                 HR:           58 bpm. Exam Location:  Inpatient Procedure: Limited Echo, Color Doppler and Cardiac Doppler Indications:    CAD Native Vessel i25.10  History:        Patient has prior history of Echocardiogram examinations, most                 recent 07/29/2015. CAD; Risk Factors:Hypertension, Diabetes and                 Dyslipidemia. COVID+ at time of study.  Sonographer:    Raquel Sarna Senior RDCS Referring Phys: [place]  1. Left ventricular ejection fraction, by estimation, is 55 to 60%. The left ventricle has normal function. The left ventricle has no regional wall motion abnormalities. Left ventricular diastolic function could not be evaluated.  2. Right ventricular systolic function is normal. The right ventricular size is normal. There is mildly elevated pulmonary artery systolic pressure. The estimated right ventricular  systolic pressure is 13.1 mmHg.  3. The mitral valve is grossly normal. Mild mitral valve regurgitation. No evidence of mitral stenosis.  4. The aortic valve is tricuspid. Aortic valve regurgitation is trivial. Mild aortic valve stenosis. Aortic valve mean gradient measures 9.9 mmHg. Aortic valve Vmax measures 2.15 m/s.  5. The inferior vena cava is dilated in size with <50% respiratory variability, suggesting right atrial pressure of 15 mmHg. Comparison(s): No significant change from prior study. FINDINGS  Left Ventricle: Left ventricular ejection fraction, by estimation, is 55 to 60%. The left ventricle has normal function. The left ventricle has no regional wall motion abnormalities. The left ventricular internal cavity size was normal in size. There is  no left ventricular hypertrophy. Right Ventricle: The right ventricular size is normal. No increase in right ventricular wall thickness. Right ventricular systolic function is normal. There is mildly elevated pulmonary artery systolic pressure. The tricuspid regurgitant velocity is 2.63  m/s, and with an assumed right atrial pressure of 15 mmHg, the estimated right ventricular systolic pressure is 43.8 mmHg. Left Atrium: Left atrial size was normal in size. Right Atrium: Right atrial size was normal in size. Pericardium: Trivial pericardial effusion is present. Presence of pericardial fat pad. Mitral Valve: The mitral valve is grossly normal. Mild mitral valve regurgitation. No evidence of mitral valve stenosis. Tricuspid Valve: The tricuspid valve is grossly normal. Tricuspid valve regurgitation is trivial. No evidence of tricuspid stenosis. Aortic Valve: The aortic valve is tricuspid. Aortic valve regurgitation is trivial. Mild aortic stenosis is present. Aortic valve mean gradient measures 9.9 mmHg. Aortic valve peak gradient measures 18.4 mmHg. Aortic valve area, by VTI measures 1.26 cm. Pulmonic Valve: The pulmonic valve was grossly normal. Pulmonic valve  regurgitation is trivial. No evidence of pulmonic stenosis. Aorta: The aortic root is normal in size and structure. Venous: The inferior vena cava is dilated in size with less than 50% respiratory variability, suggesting right atrial pressure of 15 mmHg. IAS/Shunts: The atrial septum is grossly normal. LEFT VENTRICLE PLAX 2D LVOT diam:     2.10 cm LV SV:         64 LV SV Index:   31 LVOT Area:  3.46 cm  LV Volumes (MOD) LV vol d, MOD A4C: 145.0 ml LV vol s, MOD A4C: 67.7 ml LV SV MOD A4C:     145.0 ml RIGHT VENTRICLE RV S prime:     9.14 cm/s TAPSE (M-mode): 2.3 cm AORTIC VALVE AV Area (Vmax):    1.11 cm AV Area (Vmean):   1.14 cm AV Area (VTI):     1.26 cm AV Vmax:           214.63 cm/s AV Vmean:          148.039 cm/s AV VTI:            0.506 m AV Peak Grad:      18.4 mmHg AV Mean Grad:      9.9 mmHg LVOT Vmax:         68.50 cm/s LVOT Vmean:        48.900 cm/s LVOT VTI:          0.184 m LVOT/AV VTI ratio: 0.36 TRICUSPID VALVE TR Peak grad:   27.7 mmHg TR Vmax:        263.00 cm/s  SHUNTS Systemic VTI:  0.18 m Systemic Diam: 2.10 cm Eleonore Chiquito MD Electronically signed by Eleonore Chiquito MD Signature Date/Time: 11/16/2019/12:16:24 PM    Final    US Abdomen Limited RUQ  Result Date: 11/15/2019 CLINICAL DATA:  79 year old male with nausea vomiting. EXAM: ULTRASOUND ABDOMEN LIMITED RIGHT UPPER QUADRANT COMPARISON:  CT abdomen pelvis dated 11/15/2019. FINDINGS: Gallbladder: There is sludge and stone within the gallbladder. There is diffuse gallbladder wall thickening and edema and a small pericholecystic fluid. Evaluation for sonographic Murphy's sign was limited as the patient was pre-medicated. Common bile duct: Diameter: 7 mm Liver: There is diffuse increased liver echogenicity most commonly seen in the setting of fatty infiltration. Superimposed inflammation or fibrosis is not excluded. Clinical correlation is recommended. Portal vein is patent on color Doppler imaging with normal direction of blood flow  towards the liver. Other: None. IMPRESSION: 1. Gallbladder sludge and stones with diffuse edema as seen on the earlier CT. A hepatobiliary scintigraphy may provide better evaluation of the gallbladder if there is a high clinical concern for acute cholecystitis . 2. Fatty liver. Electronically Signed   By: Anner Crete M.D.   On: 11/15/2019 21:02

## 2019-11-19 ENCOUNTER — Encounter (HOSPITAL_COMMUNITY): Payer: Self-pay | Admitting: Internal Medicine

## 2019-11-19 DIAGNOSIS — K8042 Calculus of bile duct with acute cholecystitis without obstruction: Secondary | ICD-10-CM

## 2019-11-19 DIAGNOSIS — R7881 Bacteremia: Secondary | ICD-10-CM | POA: Diagnosis not present

## 2019-11-19 DIAGNOSIS — K8043 Calculus of bile duct with acute cholecystitis with obstruction: Secondary | ICD-10-CM | POA: Diagnosis not present

## 2019-11-19 LAB — COMPREHENSIVE METABOLIC PANEL
ALT: 58 U/L — ABNORMAL HIGH (ref 0–44)
AST: 30 U/L (ref 15–41)
Albumin: 2.9 g/dL — ABNORMAL LOW (ref 3.5–5.0)
Alkaline Phosphatase: 63 U/L (ref 38–126)
Anion gap: 13 (ref 5–15)
BUN: 25 mg/dL — ABNORMAL HIGH (ref 8–23)
CO2: 22 mmol/L (ref 22–32)
Calcium: 8.5 mg/dL — ABNORMAL LOW (ref 8.9–10.3)
Chloride: 100 mmol/L (ref 98–111)
Creatinine, Ser: 1.56 mg/dL — ABNORMAL HIGH (ref 0.61–1.24)
GFR calc Af Amer: 49 mL/min — ABNORMAL LOW (ref >60)
GFR calc non Af Amer: 43 mL/min — ABNORMAL LOW (ref >60)
Glucose, Bld: 308 mg/dL — ABNORMAL HIGH (ref 70–99)
Potassium: 4.9 mmol/L (ref 3.5–5.1)
Sodium: 135 mmol/L (ref 135–145)
Total Bilirubin: 0.7 mg/dL (ref 0.3–1.2)
Total Protein: 6 g/dL — ABNORMAL LOW (ref 6.5–8.1)

## 2019-11-19 LAB — CBC WITH DIFFERENTIAL/PLATELET
Abs Immature Granulocytes: 0.02 10*3/uL (ref 0.00–0.07)
Basophils Absolute: 0 10*3/uL (ref 0.0–0.1)
Basophils Relative: 0 %
Eosinophils Absolute: 0 10*3/uL (ref 0.0–0.5)
Eosinophils Relative: 0 %
HCT: 29.3 % — ABNORMAL LOW (ref 39.0–52.0)
Hemoglobin: 9.3 g/dL — ABNORMAL LOW (ref 13.0–17.0)
Immature Granulocytes: 1 %
Lymphocytes Relative: 13 %
Lymphs Abs: 0.6 10*3/uL — ABNORMAL LOW (ref 0.7–4.0)
MCH: 31.2 pg (ref 26.0–34.0)
MCHC: 31.7 g/dL (ref 30.0–36.0)
MCV: 98.3 fL (ref 80.0–100.0)
Monocytes Absolute: 0.1 10*3/uL (ref 0.1–1.0)
Monocytes Relative: 3 %
Neutro Abs: 3.5 10*3/uL (ref 1.7–7.7)
Neutrophils Relative %: 83 %
Platelets: 246 10*3/uL (ref 150–400)
RBC: 2.98 MIL/uL — ABNORMAL LOW (ref 4.22–5.81)
RDW: 14.6 % (ref 11.5–15.5)
WBC: 4.2 10*3/uL (ref 4.0–10.5)
nRBC: 0.5 % — ABNORMAL HIGH (ref 0.0–0.2)

## 2019-11-19 LAB — GLUCOSE, CAPILLARY
Glucose-Capillary: 217 mg/dL — ABNORMAL HIGH (ref 70–99)
Glucose-Capillary: 151 mg/dL — ABNORMAL HIGH (ref 70–99)
Glucose-Capillary: 121 mg/dL — ABNORMAL HIGH (ref 70–99)
Glucose-Capillary: 169 mg/dL — ABNORMAL HIGH (ref 70–99)

## 2019-11-19 LAB — C-REACTIVE PROTEIN: CRP: 4 mg/dL — ABNORMAL HIGH (ref <1.0)

## 2019-11-19 LAB — PROCALCITONIN: Procalcitonin: 12.96 ng/mL

## 2019-11-19 LAB — BRAIN NATRIURETIC PEPTIDE: B Natriuretic Peptide: 593.2 pg/mL — ABNORMAL HIGH (ref 0.0–100.0)

## 2019-11-19 LAB — D-DIMER, QUANTITATIVE: D-Dimer, Quant: 1.96 ug/mL-FEU — ABNORMAL HIGH (ref 0.00–0.50)

## 2019-11-19 LAB — MAGNESIUM: Magnesium: 2.1 mg/dL (ref 1.7–2.4)

## 2019-11-19 MED ORDER — AMOXICILLIN-POT CLAVULANATE 875-125 MG PO TABS
1.0000 | ORAL_TABLET | Freq: Two times a day (BID) | ORAL | Status: DC
  Administered 2019-11-19 – 2019-11-20 (×2): 1 via ORAL
  Filled 2019-11-19 (×3): qty 1

## 2019-11-19 MED ORDER — AZITHROMYCIN 500 MG PO TABS
500.0000 mg | ORAL_TABLET | Freq: Every day | ORAL | Status: DC
  Administered 2019-11-20: 500 mg via ORAL
  Filled 2019-11-19: qty 1

## 2019-11-19 MED ORDER — GLIPIZIDE 5 MG PO TABS
5.0000 mg | ORAL_TABLET | Freq: Every day | ORAL | Status: DC
  Administered 2019-11-19 – 2019-11-20 (×2): 5 mg via ORAL
  Filled 2019-11-19 (×2): qty 1

## 2019-11-19 NOTE — Progress Notes (Signed)
Pt has been on zosyn and azith for this e.coli bacteremia and possible lung infection. He is on D5 of therapy. His infection markers are normal. Plan is to treat for 14d per pulm and re-evaluate with CT. D/w Dr. Waldron Labs, we will change him to PO Augmentin and azith to complete the 14d of therapy.  Onnie Boer, PharmD, BCIDP, AAHIVP, CPP Infectious Disease Pharmacist 11/19/2019 12:05 PM

## 2019-11-19 NOTE — Progress Notes (Signed)
Phone convo w pt this AM Follow up following ERCP w sphinct and stone extraction.   He feels great.  No n/v, no abd pain.  Tolerating clears and HH diet ordered for lunch Dr Ninfa Linden will see pt in a few weeks to discuss elective lap chole  GI available prn if issues, O/w no need for GI fup until colonoscopy in 03/2022 for polyp screening (GI MD now Dr Bryan Lemma, previously Delfin Edis MD)  Azucena Freed PA-C

## 2019-11-19 NOTE — Progress Notes (Addendum)
Patient ID: Cody Moss, male   DOB: 18-Apr-1941, 79 y.o.   MRN: [id number]   Results of ERCP noted. At this point, no need for Perc chole tube by IR or urgent lap chole. Would hold on lap chole several weeks given pulmonary status.  We will see here prn Will put in contact info to have him follow-up with [facility] office after discharge to meet and discuss laparoscopic cholecystectomy.

## 2019-11-19 NOTE — Progress Notes (Signed)
Physical Therapy Treatment Patient Details Name: Cody Moss MRN: [id number] DOB: 1940-11-30 Today's Date: 11/19/2019    History of Present Illness 79 y.o. male with medical history significant of HTN, DM on oral meds, h/o Lumbar spine surgery, AAA repair, pulmonary fibrosis, tobacco abuse, CAD s/p PCI/Stent. Pt admitted on 7/25 for Acute Hypoxic Resp. Failure due to bacterial pneumonia with cavitary lung lesion along with incidental acute COVID-19 viral infection. Pt also with cholecystitis, possible intervention to come. Pt s/p ECRP and will have a scheduled lap chole after d/c from here in a few weeks per Dr. Trevor Mace notes.     PT Comments    Pt was able to  Walk a few laps in the hallway with good speed.  He is mildly unsteady on his feet, reporting foot pain on our hard floors, gait stability improved with increased gait distance and O2 sats and HR remained stable in the 90s for both on RA.  Pt is hopeful to d/c home tomorrow.  I issued incentive spirometer as ordered (he had flutter valve, but not incentive). PT will continue to follow acutely for safe mobility progression.   Follow Up Recommendations  No PT follow up     Equipment Recommendations  None recommended by PT    Recommendations for Other Services       Precautions / Restrictions Precautions Precautions: Fall Precaution Comments: sats    Mobility  Bed Mobility               General bed mobility comments: Pt was OOB  Transfers Overall transfer level: Modified independent                  Ambulation/Gait Ambulation/Gait assistance: Supervision Gait Distance (Feet): 600 Feet Assistive device: None Gait Pattern/deviations: Step-through pattern;Staggering left;Staggering right   Gait velocity interpretation: >4.37 ft/sec, indicative of normal walking speed General Gait Details: Pt with staggering gait pattern that improved with increased gait distance, says his feet hurt on this hard floor,  but his wife took his shoes home.  Walks 5 mi at baseline.  O2 sats and HR in the 90s on RA during gait, pt with 2/4 DOE, but no drop in sats and his walking speed is fast, so this may be normal DOE.    Stairs             Wheelchair Mobility    Modified Rankin (Stroke Patients Only)       Balance Overall balance assessment: Mild deficits observed, not formally tested                                          Cognition Arousal/Alertness: Awake/alert Behavior During Therapy: WFL for tasks assessed/performed Overall Cognitive Status: Within Functional Limits for tasks assessed                                        Exercises Other Exercises Other Exercises: Reviewed flutter valve and issued an incentive spirometer per orders.  Pt max inspired volume of 1750 mL    General Comments        Pertinent Vitals/Pain Pain Assessment: No/denies pain    Home Living                      Prior Function  PT Goals (current goals can now be found in the care plan section) Acute Rehab PT Goals Patient Stated Goal: go home tomorrow Progress towards PT goals: Progressing toward goals    Frequency    Min 3X/week      PT Plan Current plan remains appropriate    Co-evaluation              AM-PAC PT "6 Clicks" Mobility   Outcome Measure  Help needed turning from your back to your side while in a flat bed without using bedrails?: None Help needed moving from lying on your back to sitting on the side of a flat bed without using bedrails?: None Help needed moving to and from a bed to a chair (including a wheelchair)?: None Help needed standing up from a chair using your arms (e.g., wheelchair or bedside chair)?: None Help needed to walk in hospital room?: None Help needed climbing 3-5 steps with a railing? : A Little 6 Click Score: 23    End of Session   Activity Tolerance: Patient tolerated treatment  well Patient left: in bed;with call bell/phone within reach Nurse Communication: Mobility status PT Visit Diagnosis: Other abnormalities of gait and mobility (R26.89)     Time: 6301-6010 PT Time Calculation (min) (ACUTE ONLY): 21 min  Charges:  $Gait Training: 8-22 mins                     Verdene Lennert, PT, DPT  Acute Rehabilitation [phone] pager [phone]) [phone] office

## 2019-11-19 NOTE — Progress Notes (Signed)
PROGRESS NOTE                                                                                                                                                                                                             Patient Demographics:    Cody Moss, is a 79 y.o. male, DOB - [birth year], ZWC:[id number]  Outpatient Primary MD for the patient is [name], MD    LOS - 4  Admit date - 11/15/2019    Chief Complaint  Patient presents with  . Weakness       Brief Narrative   - Cody Moss is a 79 y.o. male with medical history significant of HTN, DM on oral meds, h/o Lumbar spine surgery, AAA repair, pulmonary fibrosis, tobacco abuse, CAD s/p PCI/Stent. He reports that about 1 week ago he had an episode of abdominal pain located left abdomen that resolved spontaneously. On the day of admission he awoke with moderate to severe abdominal pain, located primarily at Left upper quadrant and left flank accompanied by N/V.  His work-up in the ER showed positive  COVID-19 infection, cavitary lung lesion with evidence of bacterial infection and elevated procalcitonin, possible calculus cholecystitis/choleangitis.   Subjective:   Patient in bed, appears comfortable, denies any headache, no fever, no chest pain, he denies abdominal pain, nausea or vomiting .  Asking for his diet to be advanced.   Assessment  & Plan :   Bacterial pneumonia with cavitary lung lesion  -Patient's lung findings are more likely related to possible bacterial component rather than COVID-19 infection, his procalcitonin is extremely high and so is his CRP, he has been started on appropriate antibiotics, negative HIV, negative Gold QuantiFERON. -Pulmonary consulted regarding  cavitary lung lesion recommend appropriate antibiotic treatment for 2 weeks thereafter follow-up with Dr. Melvyn Novas in the office.  Patient has seen Dr. Melvyn Novas before.  For now  continue azithromycin and Zosyn.  Upon discharge can switch to oral forms.  Total of 2 weeks.   COVID-19 infection -With no evidence of COVID-19 pneumonia, for now low-dose steroid and remdesivir will be continued.  I think this is not causing him any symptoms at this time. -Continue to follow inflammatory markers, and D-dimers trending down.   Recent Labs  Lab 11/15/19 1655 11/15/19 1709 11/15/19 2106 11/16/19 2353 11/17/19 6144 11/18/19 0453 11/19/19 3154  WBC  --  7.5  --  8.1 6.4 6.3 4.2  PLT  --  232  --  242 247 239 246  CRP  --   --  4.7* 10.2* 10.5* 6.7* 4.0*  DDIMER  --   --  2.87* 2.90* 2.35* 1.88* 1.96*  PROCALCITON  --   --  38.17 43.37 37.29 22.39 12.96  LATICACIDVEN  --  4.5* 2.4*  --   --   --   --   SARSCOV2NAA POSITIVE*  --   --   --   --   --   --     Hepatic Function Latest Ref Rng & Units 11/19/2019 11/18/2019 11/17/2019  Total Protein 6.5 - 8.1 g/dL 6.0(L) 5.9(L) 5.9(L)  Albumin 3.5 - 5.0 g/dL 2.9(L) 2.8(L) 2.8(L)  AST 15 - 41 U/L 30 52(H) 144(H)  ALT 0 - 44 U/L 58(H) 72(H) 112(H)  Alk Phosphatase 38 - 126 U/L 63 67 74  Total Bilirubin 0.3 - 1.2 mg/dL 0.7 0.6 1.0  Bilirubin, Direct 0.1 - 0.5 mg/dL - - -    Severe sepsis present on admission secondary to E. coli bacteremia/cholangitis  - sepsis pathophysiology has resolved  -His work-up significant for choledocholithiasis, E. coli bacteremia/cholangitis, GI input greatly appreciated, then for ERCP today with Dr. Henrene Pastor with sphincterotomy and stone extraction . -Continue with IV antibiotic coverage including IV Zosyn . - With ultrasound and CT scan suggestive of calculus cholecystitis, LFTs were high MRCP was ordered which shows possible CBD stone, GI general surgery both consulted. -We'll advance to heart healthy diet today.  History of smoking.  NicoDerm patch.  Counseled to quit smoking.  Chronic low back pain.  Home dose narcotic.  BPH.  On Proscar.  GERD.  On PPI.  E. coli bacteremia.  Most  likely source could be gallbladder however cavitary lung lesion there as well, continue Zosyn.  History of essential hypertension.  Currently blood pressure soft.  Hydrate and monitor.   AKI on CKD 3.  Baseline creatinine around 1.4, hold ARB, IVF, Renal US.    Condition - Extremely Guarded  Family Communication  :   Wife [name] - [phone] on 11/16/19, daughter Lenna Sciara -  [phone] on 11/16/19, 11/17/19, daughter updated daily.   Code Status :  Full  Consults  :  PCCM, CCS  Procedures  :    HIDA -   Renal US -  MRCP -   1. Choledocholithiasis with mild biliary duct dilation as described. 2. Pericholecystic stranding and signs of wall thickening, remaining suspicious for acute cholecystitis. If there are discordant clinical findings, HIDA scan may be helpful for further assessment. 3. Renal sinus cysts and stable collecting system dilation on the LEFT with renal cysts on the RIGHT and mild perinephric stranding, nonspecific and similar to previous imaging studies. 4. Small effusions at the lung bases. 5. Mild hepatic steatosis.   RUQ [place] - 1. Gallbladder sludge and stones with diffuse edema as seen on the earlier CT. A hepatobiliary scintigraphy may provide better evaluation of the gallbladder if there is a high clinical concern for acute cholecystitis . 2. Fatty liver.   CT - 1. A 2.6 x 2.7 cm cavitary nodule in the superior segment of the right lower lobe may represent an abscess, an infected pneumatocele. Fungal infection, TB or cavitary neoplasm are not excluded. Clinical correlation and follow-up to resolution is recommended. 2. Emphysema with slight progression of the interstitial lung disease since the prior CT. 3. Fatty liver with possible  early changes of cirrhosis. Elastography may provide better evaluation. 4. Gallbladder tumefactive sludge versus noncalcified stones. Diffuse gallbladder wall thickening and small pericholecystic fluid may be related to underlying  liver disease although acute cholecystitis is not excluded. Further evaluation with right upper quadrant ultrasound recommended. 5. Severe sigmoid.   PUD Prophylaxis : PPI  Disposition Plan  :    Status is: Inpatient  Remains inpatient appropriate because:IV treatments appropriate due to intensity of illness or inability to take PO   Dispo: The patient is from: Home              Anticipated d/c is to: Home              Anticipated d/c date is: 1 day              Patient currently is not medically stable to d/c.   DVT Prophylaxis  :  Lovenox   Lab Results  Component Value Date   PLT 246 11/19/2019    Diet :  Diet Order            Diet full liquid Room service appropriate? Yes; Fluid consistency: Thin  Diet effective now                  Inpatient Medications  Scheduled Meds: . allopurinol  300 mg Oral Daily  . aspirin  325 mg Oral Daily  . bicalutamide  50 mg Oral QHS  . Chlorhexidine Gluconate Cloth  6 each Topical Daily  . dexamethasone  6 mg Oral Q24H  . enoxaparin (LOVENOX) injection  40 mg Subcutaneous Q24H  . fenofibrate  160 mg Oral QPM  . finasteride  5 mg Oral QHS  . glipiZIDE  5 mg Oral QAC breakfast  . insulin aspart  0-20 Units Subcutaneous TID WC  . nicotine  21 mg Transdermal Daily   Continuous Infusions: . azithromycin 500 mg (11/18/19 1230)  . piperacillin-tazobactam (ZOSYN)  IV 3.375 g (11/19/19 0824)   PRN Meds:.acetaminophen, hydrALAZINE, morphine injection, ondansetron (ZOFRAN) IV, oxyCODONE-acetaminophen  Antibiotics  :    Anti-infectives (From admission, onward)   Start     Dose/Rate Route Frequency Ordered Stop   11/16/19 2000  azithromycin (ZITHROMAX) 500 mg in sodium chloride 0.9 % 250 mL IVPB  Status:  Discontinued        500 mg 250 mL/hr over 60 Minutes Intravenous Every 24 hours 11/16/19 0028 11/16/19 0923   11/16/19 1230  azithromycin (ZITHROMAX) 500 mg in sodium chloride 0.9 % 250 mL IVPB     Discontinue     500 mg 250  mL/hr over 60 Minutes Intravenous Every 24 hours 11/16/19 1121     11/16/19 1000  remdesivir 100 mg in sodium chloride 0.9 % 100 mL IVPB       "Followed by" Linked Group Details   100 mg 200 mL/hr over 30 Minutes Intravenous Daily 11/15/19 1913 11/19/19 0859   11/16/19 0000  piperacillin-tazobactam (ZOSYN) IVPB 3.375 g     Discontinue     3.375 g 12.5 mL/hr over 240 Minutes Intravenous Every 8 hours 11/15/19 2342     11/15/19 2345  piperacillin-tazobactam (ZOSYN) IVPB 3.375 g  Status:  Discontinued        3.375 g 100 mL/hr over 30 Minutes Intravenous Every 8 hours 11/15/19 2340 11/15/19 2341   11/15/19 2000  remdesivir 200 mg in sodium chloride 0.9% 250 mL IVPB       "Followed by" Linked Group Details   200 mg  580 mL/hr over 30 Minutes Intravenous Once 11/15/19 1913 11/15/19 2220   11/15/19 1730  azithromycin (ZITHROMAX) 500 mg in sodium chloride 0.9 % 250 mL IVPB        500 mg 250 mL/hr over 60 Minutes Intravenous  Once 11/15/19 1723 11/15/19 1915   11/15/19 1700  piperacillin-tazobactam (ZOSYN) IVPB 3.375 g        3.375 g 100 mL/hr over 30 Minutes Intravenous  Once 11/15/19 1650 11/15/19 1739       Time Spent in minutes  30   Phillips Climes M.D on 11/19/2019 at 10:42 AM  To page go to [email or web address] - password [email or web address]  [facility] -  Office  [phone]    See all Orders from today for further details    Objective:   Vitals:   11/19/19 0031 11/19/19 0459 11/19/19 0800 11/19/19 0939  BP: (!) 147/65 (!) 167/76 (!) 175/72 (!) 116/57  Pulse:  47 (!) 41 60  Resp:  _0 Temp:  (!) 97.5 F (36.4 C) (!) 97.5 F (36.4 C)   TempSrc:  Oral Oral   SpO2:  92% 94% 95%  Weight:      Height:        Wt Readings from Last 3 Encounters:  11/18/19 85.2 kg  08/12/19 87.5 kg  05/11/19 87.1 kg     Intake/Output Summary (Last 24 hours) at 11/19/2019 1042 Last data filed at 11/19/2019 0931 Gross per 24 hour  Intake 1360 ml  Output 2500 ml  Net -1140 ml      Physical Exam  Awake Alert, Oriented X 3, No new F.N deficits, Normal affect Symmetrical Chest wall movement, Good air movement bilaterally, CTAB RRR,No Gallops,Rubs or new Murmurs, No Parasternal Heave +ve B.Sounds, Abd Soft, No tenderness, No rebound - guarding or rigidity. No Cyanosis, Clubbing or edema, No new Rash or bruise         Data Review:    CBC Recent Labs  Lab 11/15/19 1709 11/16/19 0758 11/17/19 0436 11/18/19 0453 11/19/19 0337  WBC 7.5 8.1 6.4 6.3 4.2  HGB 9.8* 8.6* 8.6* 8.5* 9.3*  HCT 30.3* 28.0* 27.3* 26.3* 29.3*  PLT 232 242 247 239 246  MCV 99.3 99.3 99.3 96.7 98.3  MCH 32.1 30.5 31.3 31.3 31.2  MCHC 32.3 30.7 31.5 32.3 31.7  RDW 14.3 14.6 14.9 14.9 14.6  LYMPHSABS 0.2*  --  0.7 0.5* 0.6*  MONOABS 0.5  --  0.3 0.3 0.1  EOSABS 0.0  --  0.0 0.0 0.0  BASOSABS 0.0  --  0.0 0.0 0.0    Chemistries  Recent Labs  Lab 11/15/19 1709 11/15/19 1717 11/16/19 0354 11/16/19 0758 11/17/19 0436 11/18/19 0453 11/19/19 0337  NA 136  --  138  --  139 134* 135  K 3.6  --  4.2  --  4.4 4.3 4.9  CL 104  --  107  --  107 102 100  CO2 19*  --  24  --  24 21* 22  GLUCOSE 229*  --  187*  --  199* 301* 308*  BUN 21  --  17  --  23 24* 25*  CREATININE 1.47*  --  1.36*  --  1.64* 1.47* 1.56*  CALCIUM 8.3*  --  8.2*  --  8.7* 8.5* 8.5*  AST 274*  --   --   --  144* 52* 30  ALT 104*  --   --   --  112* 72* 58*  ALKPHOS  80  --   --   --  74 67 63  BILITOT 1.9*  --   --   --  1.0 0.6 0.7  MG  --   --   --  1.8 2.2 2.1 2.1  INR 1.1  --   --   --   --   --   --   HGBA1C  --  7.5*  --   --   --   --   --      ------------------------------------------------------------------------------------------------------------------ No results for input(s): CHOL, HDL, LDLCALC, TRIG, CHOLHDL, LDLDIRECT in the last 72 hours.  Lab Results  Component Value Date   HGBA1C 7.5 (H) 11/15/2019    ------------------------------------------------------------------------------------------------------------------ No results for input(s): TSH, T4TOTAL, T3FREE, THYROIDAB in the last 72 hours.  Invalid input(s): FREET3  Cardiac Enzymes No results for input(s): CKMB, TROPONINI, MYOGLOBIN in the last 168 hours.  Invalid input(s): CK ------------------------------------------------------------------------------------------------------------------    Component Value Date/Time   BNP 593.2 (H) 11/19/2019 7253    Micro Results Recent Results (from the past 240 hour(s))  SARS Coronavirus 2 by RT PCR (hospital order, performed in [facility] hospital lab) Nasopharyngeal Nasopharyngeal Swab     Status: Abnormal   Collection Time: 11/15/19  4:55 PM   Specimen: Nasopharyngeal Swab  Result Value Ref Range Status   SARS Coronavirus 2 POSITIVE (A) NEGATIVE Final    Comment: RESULT CALLED TO, READ BACK BY AND VERIFIED WITH: RN MEEKS [id number] FCP (NOTE) SARS-CoV-2 target nucleic acids are DETECTED  SARS-CoV-2 RNA is generally detectable in upper respiratory specimens  during the acute phase of infection.  Positive results are indicative  of the presence of the identified virus, but do not rule out bacterial infection or co-infection with other pathogens not detected by the test.  Clinical correlation with patient history and  other diagnostic information is necessary to determine patient infection status.  The expected result is negative.  Fact Sheet for Patients:   StrictlyIdeas.no   Fact Sheet for Healthcare Providers:   BankingDealers.co.za    This test is not yet approved or cleared by the [place] FDA and  has been authorized for detection and/or diagnosis of SARS-CoV-2 by FDA under an Emergency Use Authorization (EUA).  This EUA will remain in effect (meaning this test can be  used) for the duration of  the COVID-19  declaration under Section 564(b)(1) of the Act, 21 U.S.C. section 360-bbb-3(b)(1), unless the authorization is terminated or revoked sooner.  Performed at [facility], [place] [address] [place], [place] [address]   Blood Culture (routine x 2)     Status: None (Preliminary result)   Collection Time: 11/15/19  5:00 PM   Specimen: BLOOD RIGHT WRIST  Result Value Ref Range Status   Specimen Description BLOOD RIGHT WRIST  Final   Special Requests   Final    BOTTLES DRAWN AEROBIC AND ANAEROBIC Blood Culture adequate volume   Culture   Final    NO GROWTH 2 DAYS Performed at [facility], [place] [address], [place], [place] [address]    Report Status PENDING  Incomplete  Blood Culture (routine x 2)     Status: Abnormal   Collection Time: 11/15/19  5:09 PM   Specimen: BLOOD  Result Value Ref Range Status   Specimen Description BLOOD RIGHT ANTECUBITAL  Final   Special Requests   Final    BOTTLES DRAWN AEROBIC AND ANAEROBIC Blood Culture results may not be optimal due to  an excessive volume of blood received in culture bottles   Culture  Setup Time   Final    GRAM NEGATIVE RODS ANAEROBIC BOTTLE ONLY Organism ID to follow CRITICAL RESULT CALLED TO, READ BACK BY AND VERIFIED WITH: Sarajane Marek [id number] MLM Performed at [facility], [place] [address] [place], [place] [address]    Culture ESCHERICHIA COLI (A)  Final   Report Status 11/18/2019 FINAL  Final   Organism ID, Bacteria ESCHERICHIA COLI  Final      Susceptibility   Escherichia coli - MIC*    AMPICILLIN >=32 RESISTANT Resistant     CEFAZOLIN <=4 SENSITIVE Sensitive     CEFEPIME <=0.12 SENSITIVE Sensitive     CEFTAZIDIME <=1 SENSITIVE Sensitive     CEFTRIAXONE <=0.25 SENSITIVE Sensitive     CIPROFLOXACIN 0.5 SENSITIVE Sensitive     GENTAMICIN <=1 SENSITIVE Sensitive     IMIPENEM <=0.25 SENSITIVE Sensitive     TRIMETH/SULFA >=320 RESISTANT Resistant     AMPICILLIN/SULBACTAM 8 SENSITIVE Sensitive     PIP/TAZO  <=4 SENSITIVE Sensitive     * ESCHERICHIA COLI  Blood Culture ID Panel (Reflexed)     Status: Abnormal   Collection Time: 11/15/19  5:09 PM  Result Value Ref Range Status   Enterococcus species NOT DETECTED NOT DETECTED Final   Listeria monocytogenes NOT DETECTED NOT DETECTED Final   Staphylococcus species NOT DETECTED NOT DETECTED Final   Staphylococcus aureus (BCID) NOT DETECTED NOT DETECTED Final   Streptococcus species NOT DETECTED NOT DETECTED Final   Streptococcus agalactiae NOT DETECTED NOT DETECTED Final   Streptococcus pneumoniae NOT DETECTED NOT DETECTED Final   Streptococcus pyogenes NOT DETECTED NOT DETECTED Final   Acinetobacter baumannii NOT DETECTED NOT DETECTED Final   Enterobacteriaceae species DETECTED (A) NOT DETECTED Final    Comment: Enterobacteriaceae represent a large family of gram-negative bacteria, not a single organism. CRITICAL RESULT CALLED TO, READ BACK BY AND VERIFIED WITH: PHARMD J FRENS [id number] MLM    Enterobacter cloacae complex NOT DETECTED NOT DETECTED Final   Escherichia coli DETECTED (A) NOT DETECTED Final    Comment: CRITICAL RESULT CALLED TO, READ BACK BY AND VERIFIED WITH: PHARMD J FRENS [id number] MLM    Klebsiella oxytoca NOT DETECTED NOT DETECTED Final   Klebsiella pneumoniae NOT DETECTED NOT DETECTED Final   Proteus species NOT DETECTED NOT DETECTED Final   Serratia marcescens NOT DETECTED NOT DETECTED Final   Carbapenem resistance NOT DETECTED NOT DETECTED Final   Haemophilus influenzae NOT DETECTED NOT DETECTED Final   Neisseria meningitidis NOT DETECTED NOT DETECTED Final   Pseudomonas aeruginosa NOT DETECTED NOT DETECTED Final   Candida albicans NOT DETECTED NOT DETECTED Final   Candida glabrata NOT DETECTED NOT DETECTED Final   Candida krusei NOT DETECTED NOT DETECTED Final   Candida parapsilosis NOT DETECTED NOT DETECTED Final   Candida tropicalis NOT DETECTED NOT DETECTED Final    Comment: Performed at [place], [place]. [address], [place], [place] [address]  Urine culture     Status: None   Collection Time: 11/15/19 10:46 PM   Specimen: Urine, Random  Result Value Ref Range Status   Specimen Description URINE, RANDOM  Final   Special Requests NONE  Final   Culture   Final    NO GROWTH Performed at [facility], [place] [address], [place], [place] [address]    Report Status 11/16/2019 FINAL  Final  MRSA PCR Screening  Status: None   Collection Time: 11/16/19  2:30 AM   Specimen: Nasal Mucosa; Nasopharyngeal  Result Value Ref Range Status   MRSA by PCR NEGATIVE NEGATIVE Final    Comment:        The GeneXpert MRSA Assay (FDA approved for NASAL specimens only), is one component of a comprehensive MRSA colonization surveillance program. It is not intended to diagnose MRSA infection nor to guide or monitor treatment for MRSA infections. Performed at [facility], [place] [address] [place], [place] [address]   Expectorated sputum assessment w rflx to resp cult     Status: None   Collection Time: 11/18/19 10:00 AM   Specimen: Expectorated Sputum  Result Value Ref Range Status   Specimen Description Expect. Sput  Final   Special Requests NONE  Final   Sputum evaluation   Final    THIS SPECIMEN IS ACCEPTABLE FOR SPUTUM CULTURE Performed at [facility], [address] [place], [place] [address]    Report Status 11/18/2019 FINAL  Final  Culture, respiratory     Status: None (Preliminary result)   Collection Time: 11/18/19 10:00 AM  Result Value Ref Range Status   Specimen Description Expect. Sput  Final   Special Requests NONE Reflexed from [id number]  Final   Gram Stain   Final    NO WBC SEEN FEW BUDDING YEAST SEEN RARE GRAM VARIABLE ROD    Culture   Final    CULTURE REINCUBATED FOR BETTER GROWTH Performed at [facility], [place] [address], [place], [place] [address]    Report Status PENDING  Incomplete    Radiology Reports CT Chest W Contrast  Result Date:  11/15/2019 CLINICAL DATA:  79 year old male with pneumonia. Concern for abscess or effusion. EXAM: CT CHEST, ABDOMEN, AND PELVIS WITH CONTRAST TECHNIQUE: Multidetector CT imaging of the chest, abdomen and pelvis was performed following the standard protocol during bolus administration of intravenous contrast. CONTRAST:  179m OMNIPAQUE IOHEXOL 300 MG/ML  SOLN COMPARISON:  Chest CT dated 02/14/2018. Chest radiograph dated 11/15/2019. FINDINGS: CT CHEST FINDINGS Cardiovascular: There is no cardiomegaly or pericardial effusion. Advanced 3 vessel coronary vascular calcification. There is advanced calcified and noncalcified plaque of the thoracic aorta. No aneurysmal dilatation or dissection. The central pulmonary arteries appear patent. Mediastinum/Nodes: Right hilar adenopathy measuring 13 mm. Subcarinal lymph node measures 10 mm in short axis. The esophagus and the thyroid gland are grossly unremarkable. No mediastinal fluid collection. Lungs/Pleura: There is background of emphysema. Bibasilar subpleural reticulation as seen on the prior CT in keeping with interstitial lung disease. Overall slight progression of the fibrotic changes since the prior CT. There is a 2.6 x 2.7 cm cavitary nodule in the superior segment of the right lower lobe which may represent an abscess, an infected pneumatocele. Fungal infection, TB or cavitary neoplasm are not excluded. Clinical correlation and follow-up to resolution is recommended. Several scattered bilateral lower lobe nodular densities, present on the prior CT. No lobar consolidation, pleural effusion, or pneumothorax. The central airways are patent. Musculoskeletal: No chest wall mass or suspicious bone lesions identified. CT ABDOMEN PELVIS FINDINGS No intra-abdominal free air or free fluid. Hepatobiliary: There is fatty infiltration of the liver. Slight irregularity of the liver contour may represent early changes of cirrhosis. Elastography may provide better evaluation. There  is tumefactive sludge versus noncalcified stones within the gallbladder. There is diffuse gallbladder wall thickening and small pericholecystic fluid. Findings may be related to underlying liver disease although acute cholecystitis is  not excluded. Further evaluation with right upper quadrant ultrasound recommended. Pancreas: Unremarkable. No pancreatic ductal dilatation or surrounding inflammatory changes. Spleen: Normal in size without focal abnormality. Adrenals/Urinary Tract: The adrenal glands are unremarkable. Multiple bilateral renal cysts with the largest a lobulated appearing left renal interpolar cyst measuring up to 13 cm in length. Several additional subcentimeter hypodensities are too small to characterize. There is no hydronephrosis on either side. There is symmetric enhancement and excretion of contrast by both kidneys. Mild bilateral perinephric stranding, nonspecific. Correlation with urinalysis recommended to exclude UTI. The visualized ureters and urinary bladder appear unremarkable. Stomach/Bowel: There is severe sigmoid diverticulosis without active inflammatory changes. There is no bowel obstruction or active inflammation. The appendix is normal. Vascular/Lymphatic: There is advanced aortoiliac atherosclerotic disease. 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 The graft appears patent. There is advanced atherosclerotic calcification of the common femoral arteries bilaterally, right greater left. The IVC is unremarkable. No portal venous gas. There is no adenopathy. Reproductive: Prostatectomy. 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 Musculoskeletal: There is degenerative changes of the spine. No acute osseous pathology. IMPRESSION: 1. A 2.6 x 2.7 cm cavitary nodule in the superior segment of the right lower lobe may represent an abscess, an infected pneumatocele. Fungal infection, TB or cavitary neoplasm are not  excluded. Clinical correlation and follow-up to resolution is recommended. 2. Emphysema with slight progression of the interstitial lung disease since the prior CT. 3. Fatty liver with possible early changes of cirrhosis. Elastography may provide better evaluation. 4. Gallbladder tumefactive sludge versus noncalcified stones. Diffuse gallbladder wall thickening and small pericholecystic fluid may be related to underlying liver disease although acute cholecystitis is not excluded. Further evaluation with right upper quadrant ultrasound recommended. 5. Severe sigmoid diverticulosis. No bowel obstruction. Normal appendix. 6. Aortic Atherosclerosis (ICD10-I70.0) and Emphysema (ICD10-J43.9). Electronically Signed   By: Anner Crete M.D.   On: 11/15/2019 19:11   NM Hepatobiliary Liver Func  Result Date: 11/17/2019 CLINICAL DATA:  Nausea and vomiting. Cholelithiasis and choledocholithiasis. EXAM: NUCLEAR MEDICINE HEPATOBILIARY IMAGING TECHNIQUE: Sequential images of the abdomen were obtained out to 60 minutes following intravenous administration of radiopharmaceutical. RADIOPHARMACEUTICALS:  5.5 mCi Tc-65m Choletec IV COMPARISON:  None. FINDINGS: Prompt uptake and biliary excretion of activity by the liver is seen. Gallbladder activity is visualized, consistent with patency of cystic duct. Biliary activity passes into small bowel, consistent with patent common bile duct. Reflux of biliary activity into the stomach is noted. IMPRESSION: Patency of both cystic and common bile ducts is demonstrated. Bile reflux noted. Electronically Signed   By: JMarlaine HindM.D.   On: 11/17/2019 15:15   CT Abdomen Pelvis W Contrast  Result Date: 11/15/2019 CLINICAL DATA:  79year old male with pneumonia. Concern for abscess or effusion. EXAM: CT CHEST, ABDOMEN, AND PELVIS WITH CONTRAST TECHNIQUE: Multidetector CT imaging of the chest, abdomen and pelvis was performed following the standard protocol during bolus administration of  intravenous contrast. CONTRAST:  1078mOMNIPAQUE IOHEXOL 300 MG/ML  SOLN COMPARISON:  Chest CT dated 02/14/2018. Chest radiograph dated 11/15/2019. FINDINGS: CT CHEST FINDINGS Cardiovascular: There is no cardiomegaly or pericardial effusion. Advanced 3 vessel coronary vascular calcification. There is advanced calcified and noncalcified plaque of the thoracic aorta. No aneurysmal dilatation or dissection. The central pulmonary arteries appear patent. Mediastinum/Nodes: Right hilar adenopathy measuring 13 mm. Subcarinal lymph node measures 10 mm in short axis. The esophagus and the thyroid gland are grossly  unremarkable. No mediastinal fluid collection. Lungs/Pleura: There is background of emphysema. Bibasilar subpleural reticulation as seen on the prior CT in keeping with interstitial lung disease. Overall slight progression of the fibrotic changes since the prior CT. There is a 2.6 x 2.7 cm cavitary nodule in the superior segment of the right lower lobe which may represent an abscess, an infected pneumatocele. Fungal infection, TB or cavitary neoplasm are not excluded. Clinical correlation and follow-up to resolution is recommended. Several scattered bilateral lower lobe nodular densities, present on the prior CT. No lobar consolidation, pleural effusion, or pneumothorax. The central airways are patent. Musculoskeletal: No chest wall mass or suspicious bone lesions identified. CT ABDOMEN PELVIS FINDINGS No intra-abdominal free air or free fluid. Hepatobiliary: There is fatty infiltration of the liver. Slight irregularity of the liver contour may represent early changes of cirrhosis. Elastography may provide better evaluation. There is tumefactive sludge versus noncalcified stones within the gallbladder. There is diffuse gallbladder wall thickening and small pericholecystic fluid. Findings may be related to underlying liver disease although acute cholecystitis is not excluded. Further evaluation with right upper  quadrant ultrasound recommended. Pancreas: Unremarkable. No pancreatic ductal dilatation or surrounding inflammatory changes. Spleen: Normal in size without focal abnormality. Adrenals/Urinary Tract: The adrenal glands are unremarkable. Multiple bilateral renal cysts with the largest a lobulated appearing left renal interpolar cyst measuring up to 13 cm in length. Several additional subcentimeter hypodensities are too small to characterize. There is no hydronephrosis on either side. There is symmetric enhancement and excretion of contrast by both kidneys. Mild bilateral perinephric stranding, nonspecific. Correlation with urinalysis recommended to exclude UTI. The visualized ureters and urinary bladder appear unremarkable. Stomach/Bowel: There is severe sigmoid diverticulosis without active inflammatory changes. There is no bowel obstruction or active inflammation. The appendix is normal. Vascular/Lymphatic: There is advanced aortoiliac atherosclerotic disease. 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 The graft appears patent. There is advanced atherosclerotic calcification of the common femoral arteries bilaterally, right greater left. The IVC is unremarkable. No portal venous gas. There is no adenopathy. Reproductive: Prostatectomy. 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 Musculoskeletal: There is degenerative changes of the spine. No acute osseous pathology. IMPRESSION: 1. A 2.6 x 2.7 cm cavitary nodule in the superior segment of the right lower lobe may represent an abscess, an infected pneumatocele. Fungal infection, TB or cavitary neoplasm are not excluded. Clinical correlation and follow-up to resolution is recommended. 2. Emphysema with slight progression of the interstitial lung disease since the prior CT. 3. Fatty liver with possible early changes of cirrhosis. Elastography may provide better evaluation. 4. Gallbladder  tumefactive sludge versus noncalcified stones. Diffuse gallbladder wall thickening and small pericholecystic fluid may be related to underlying liver disease although acute cholecystitis is not excluded. Further evaluation with right upper quadrant ultrasound recommended. 5. Severe sigmoid diverticulosis. No bowel obstruction. Normal appendix. 6. Aortic Atherosclerosis (ICD10-I70.0) and Emphysema (ICD10-J43.9). Electronically Signed   By: Anner Crete M.D.   On: 11/15/2019 19:11   US RENAL  Result Date: 11/18/2019 CLINICAL DATA:  Acute renal injury, COVID-19 positivity EXAM: RENAL / URINARY TRACT ULTRASOUND COMPLETE COMPARISON:  11/15/2019 FINDINGS: Right Kidney: Renal measurements: 12.3 x 5.1 x 4.3 cm. = volume: 140 mL. Less than 10 cysts are noted. The largest of these measures 2.7 cm in the midportion of the right kidney. Left Kidney: Renal measurements: 14.3 x 5.7 x 5.5 cm. = volume: 233 mL. Less than 10  cysts are noted. The largest of these measures 7.3 cm in greatest dimension. These are stable from the prior CT examination Bladder: Appears normal for degree of bladder distention. Other: Gallbladder is partially distended with echogenic material within similar to that seen on prior exam consistent with gallbladder sludge and stones. IMPRESSION: Gallbladder sludge and stones. Bilateral renal cysts which are simple in nature. No obstructive changes are noted. Electronically Signed   By: Inez Catalina M.D.   On: 11/18/2019 03:03   MR ABDOMEN MRCP WO CONTRAST  Result Date: 11/17/2019 CLINICAL DATA:  Cholelithiasis EXAM: MRI ABDOMEN WITHOUT CONTRAST  (INCLUDING MRCP) TECHNIQUE: Multiplanar multisequence MR imaging of the abdomen was performed. Heavily T2-weighted images of the biliary and pancreatic ducts were obtained, and three-dimensional MRCP images were rendered by post processing. COMPARISON:  CT chest, abdomen and pelvis of November 15, 2019 FINDINGS: Lower chest: Small effusions at the lung bases. As  basilar airspace disease and parenchymal findings demonstrated on the recent chest CT are not well evaluated on the MRI. Hepatobiliary: Mild hepatic steatosis. No focal, suspicious hepatic lesion, assessment limited by the noncontrast imaging. Pericholecystic stranding and signs of wall thickening. Mild biliary duct distension with numerous filling defects in the common bile duct compatible with multiple small stones. Largest approximately 5 mm. At least 4-5 additional small biliary calculi in the common bile duct. Pancreas:  No ductal dilation.  No peripancreatic inflammation. Spleen: Skin normal in size and contour without focal, suspicious lesion. Adrenals/Urinary Tract:  Adrenal glands are normal spleen. Bilateral renal cysts. Asymmetric albeit slightly Peri nephric stranding greater on the LEFT than the RIGHT. Dilated collecting systems in the upper pole and in the lower pole LEFT kidney perhaps due to mass-effect upon collecting systems in the setting of large renal sinus cysts with similar appearance to prior imaging studies. Stomach/Bowel: Stomach gastrointestinal tract with limited assessment, unremarkable to the extent evaluated. 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 Other:  None. Musculoskeletal: No suspicious bone lesions identified. IMPRESSION: 1. Choledocholithiasis with mild biliary duct dilation as described. 2. Pericholecystic stranding and signs of wall thickening, remaining suspicious for acute cholecystitis. If there are discordant clinical findings, HIDA scan may be helpful for further assessment. 3. Renal sinus cysts and stable collecting system dilation on the LEFT with renal cysts on the RIGHT and mild perinephric stranding, nonspecific and similar to previous imaging studies. 4. Small effusions at the lung bases. 5. Mild hepatic steatosis. Electronically Signed   By: Zetta Bills M.D.   On: 11/17/2019  08:06   DG Chest Port 1 View  Result Date: 11/16/2019 CLINICAL DATA:  Shortness of breath and fever EXAM: PORTABLE CHEST 1 VIEW COMPARISON:  Chest radiograph and chest CT November 15, 2019 FINDINGS: There is a degree of underlying fibrosis. The cavitary lesion in the right lower lobe seen 1 day prior is less well seen by radiography although is evident, measuring 2.5 x 2.4 cm. There is a degree of lower lobe bronchiectatic change. No new opacity evident. Heart size and pulmonary vascularity are normal. Lymph node prominence seen on CT is not appreciable by radiography. There is aortic atherosclerosis. No bone lesions appreciable. IMPRESSION: Cavitary lesion right lower lobe, much better seen on recent CT. This nodular lesion measures 2.5 x 2.4 cm on portable radiographic examination. There is an underlying degree of fibrosis in the bases. No new opacity evident. Cardiac silhouette stable. Aortic Atherosclerosis (ICD10-I70.0). Electronically Signed   By: Gwyndolyn Saxon  Jasmine December III M.D.   On: 11/16/2019 09:34   DG Chest Port 1 View  Result Date: 11/15/2019 CLINICAL DATA:  Evaluate for infection. EXAM: PORTABLE CHEST 1 VIEW COMPARISON:  07/27/2015 FINDINGS: Lungs are adequately inflated without effusion or pneumothorax. Mild increased density over the right hilum/perihilar region. Cardiomediastinal silhouette and remainder the exam is unchanged. IMPRESSION: Minimal increased density over the right hilar/perihilar region. Medial airspace process is possible. Consider PA and lateral chest radiograph for better evaluation. Electronically Signed   By: Marin Olp M.D.   On: 11/15/2019 17:04   DG ERCP BILIARY & PANCREATIC DUCTS  Result Date: 11/19/2019 CLINICAL DATA:  ERCP for bile duct stones. EXAM: ERCP TECHNIQUE: Multiple spot images obtained with the fluoroscopic device and submitted for interpretation post-procedure. COMPARISON:  MRCP-11/17/2019; nuclear medicine HIDA scan-11/17/2019 FINDINGS: Eight spot  intraoperative fluoroscopic images of the right upper abdominal quadrant during ERCP are provided for review Initial image demonstrates an ERCP probe overlying the right upper abdominal quadrant Subsequent images demonstrate selective cannulation opacification of the common bile duct. There are several filling defects within the CBD, potentially representative of air bubbles versus choledocholithiasis. Subsequent images demonstrate insufflation of a balloon within the central aspect of the CBD with subsequent biliary sweeping and presumed sphincterotomy. There is minimal opacification the cystic duct with passage of contrast to the level of the gallbladder lumen. There is minimal opacification intrahepatic biliary tree which appears nondilated. IMPRESSION: ERCP with biliary sweeping and presumed sphincterotomy as above. These images were submitted for radiologic interpretation only. Please see the procedural report for the amount of contrast and the fluoroscopy time utilized. Electronically Signed   By: Sandi Mariscal M.D.   On: 11/19/2019 07:40   ECHOCARDIOGRAM LIMITED  Result Date: 11/16/2019    ECHOCARDIOGRAM LIMITED REPORT   Patient Name:   DEMETRES PROCHNOW Date of Exam: 11/16/2019 Medical Rec #:  [id number]         Height:       70.0 in Accession #:    [id number]        Weight:       187.8 lb Date of Birth:  1941-01-11         BSA:          2.032 m Patient Age:    47 years          BP:           103/63 mmHg Patient Gender: M                 HR:           58 bpm. Exam Location:  Inpatient Procedure: Limited Echo, Color Doppler and Cardiac Doppler Indications:    CAD Native Vessel i25.10  History:        Patient has prior history of Echocardiogram examinations, most                 recent 07/29/2015. CAD; Risk Factors:Hypertension, Diabetes and                 Dyslipidemia. COVID+ at time of study.  Sonographer:    Raquel Sarna Senior RDCS Referring Phys: [place]  1. Left ventricular ejection  fraction, by estimation, is 55 to 60%. The left ventricle has normal function. The left ventricle has no regional wall motion abnormalities. Left ventricular diastolic function could not be evaluated.  2. Right ventricular systolic function is normal. The right ventricular size is normal. There is mildly elevated pulmonary  artery systolic pressure. The estimated right ventricular systolic pressure is 18.2 mmHg.  3. The mitral valve is grossly normal. Mild mitral valve regurgitation. No evidence of mitral stenosis.  4. The aortic valve is tricuspid. Aortic valve regurgitation is trivial. Mild aortic valve stenosis. Aortic valve mean gradient measures 9.9 mmHg. Aortic valve Vmax measures 2.15 m/s.  5. The inferior vena cava is dilated in size with <50% respiratory variability, suggesting right atrial pressure of 15 mmHg. Comparison(s): No significant change from prior study. FINDINGS  Left Ventricle: Left ventricular ejection fraction, by estimation, is 55 to 60%. The left ventricle has normal function. The left ventricle has no regional wall motion abnormalities. The left ventricular internal cavity size was normal in size. There is  no left ventricular hypertrophy. Right Ventricle: The right ventricular size is normal. No increase in right ventricular wall thickness. Right ventricular systolic function is normal. There is mildly elevated pulmonary artery systolic pressure. The tricuspid regurgitant velocity is 2.63  m/s, and with an assumed right atrial pressure of 15 mmHg, the estimated right ventricular systolic pressure is 99.3 mmHg. Left Atrium: Left atrial size was normal in size. Right Atrium: Right atrial size was normal in size. Pericardium: Trivial pericardial effusion is present. Presence of pericardial fat pad. Mitral Valve: The mitral valve is grossly normal. Mild mitral valve regurgitation. No evidence of mitral valve stenosis. Tricuspid Valve: The tricuspid valve is grossly normal. Tricuspid valve  regurgitation is trivial. No evidence of tricuspid stenosis. Aortic Valve: The aortic valve is tricuspid. Aortic valve regurgitation is trivial. Mild aortic stenosis is present. Aortic valve mean gradient measures 9.9 mmHg. Aortic valve peak gradient measures 18.4 mmHg. Aortic valve area, by VTI measures 1.26 cm. Pulmonic Valve: The pulmonic valve was grossly normal. Pulmonic valve regurgitation is trivial. No evidence of pulmonic stenosis. Aorta: The aortic root is normal in size and structure. Venous: The inferior vena cava is dilated in size with less than 50% respiratory variability, suggesting right atrial pressure of 15 mmHg. IAS/Shunts: The atrial septum is grossly normal. LEFT VENTRICLE PLAX 2D LVOT diam:     2.10 cm LV SV:         64 LV SV Index:   31 LVOT Area:     3.46 cm  LV Volumes (MOD) LV vol d, MOD A4C: 145.0 ml LV vol s, MOD A4C: 67.7 ml LV SV MOD A4C:     145.0 ml RIGHT VENTRICLE RV S prime:     9.14 cm/s TAPSE (M-mode): 2.3 cm AORTIC VALVE AV Area (Vmax):    1.11 cm AV Area (Vmean):   1.14 cm AV Area (VTI):     1.26 cm AV Vmax:           214.63 cm/s AV Vmean:          148.039 cm/s AV VTI:            0.506 m AV Peak Grad:      18.4 mmHg AV Mean Grad:      9.9 mmHg LVOT Vmax:         68.50 cm/s LVOT Vmean:        48.900 cm/s LVOT VTI:          0.184 m LVOT/AV VTI ratio: 0.36 TRICUSPID VALVE TR Peak grad:   27.7 mmHg TR Vmax:        263.00 cm/s  SHUNTS Systemic VTI:  0.18 m Systemic Diam: 2.10 cm Eleonore Chiquito MD Electronically signed by Eleonore Chiquito MD Signature Date/Time:  11/16/2019/12:16:24 PM    Final    US Abdomen Limited RUQ  Result Date: 11/15/2019 CLINICAL DATA:  79 year old male with nausea vomiting. EXAM: ULTRASOUND ABDOMEN LIMITED RIGHT UPPER QUADRANT COMPARISON:  CT abdomen pelvis dated 11/15/2019. FINDINGS: Gallbladder: There is sludge and stone within the gallbladder. There is diffuse gallbladder wall thickening and edema and a small pericholecystic fluid. Evaluation for  sonographic Murphy's sign was limited as the patient was pre-medicated. Common bile duct: Diameter: 7 mm Liver: There is diffuse increased liver echogenicity most commonly seen in the setting of fatty infiltration. Superimposed inflammation or fibrosis is not excluded. Clinical correlation is recommended. Portal vein is patent on color Doppler imaging with normal direction of blood flow towards the liver. Other: None. IMPRESSION: 1. Gallbladder sludge and stones with diffuse edema as seen on the earlier CT. A hepatobiliary scintigraphy may provide better evaluation of the gallbladder if there is a high clinical concern for acute cholecystitis . 2. Fatty liver. Electronically Signed   By: Anner Crete M.D.   On: 11/15/2019 21:02

## 2019-11-19 NOTE — Plan of Care (Signed)
Patient A&O x4. VSS. Pt room air sating above 93%. Lungs sounds regular and  Clear/diminished. Free from falls. Pt turns self in bed. Ambulated to chair independently. Ambulated in hallway x2 during shift. Pt tolerating fluids and meals.  Pt voiding adequately during shift.DVT prophylactic: lovenox. No c/o of pain. Bed wheels locked. Phone and call bell within reach. Pt is resting, no distress. POC reviewed, planning for discharge on 7/30    Problem: Education: Goal: Knowledge of risk factors and measures for prevention of condition will improve Outcome: Progressing   Problem: Coping: Goal: Psychosocial and spiritual needs will be supported Outcome: Progressing   Problem: Respiratory: Goal: Will maintain a patent airway Outcome: Progressing Goal: Complications related to the disease process, condition or treatment will be avoided or minimized Outcome: Progressing

## 2019-11-19 NOTE — Progress Notes (Signed)
Occupational Therapy Treatment Patient Details Name: Cody Moss MRN: [id number] DOB: [birth year] Today's Date: 11/19/2019    History of present illness 79 y.o. male with medical history significant of HTN, DM on oral meds, h/o Lumbar spine surgery, AAA repair, pulmonary fibrosis, tobacco abuse, CAD s/p PCI/Stent. Pt admitted on 7/25 for Acute Hypoxic Resp. Failure due to bacterial pneumonia with cavitary lung lesion along with incidental acute COVID-19 viral infection. Pt also with cholecystitis, possible intervention to come. Pt s/p ECRP and will have a scheduled lap chole after d/c from here in a few weeks per Dr. Trevor Mace notes.    OT comments  Patient supine in bed on arrival.  He reports wanting to wait until after lunch to walk as his feet were hurting from walk earlier.  Patient performed in room mobility with supervision.  Educated patient on use of flutter valve and purpose then practiced.  Patient on RA and SpO2 initially 94 at rest and with using flutter valve SpO2 increased to 98.  Will continue to follow with OT acutely to address the deficits listed below.    Follow Up Recommendations  No OT follow up    Equipment Recommendations  None recommended by OT    Recommendations for Other Services      Precautions / Restrictions Precautions Precautions: Fall Precaution Comments: sats Restrictions Weight Bearing Restrictions: No       Mobility Bed Mobility Overal bed mobility: Modified Independent             General bed mobility comments: Pt was OOB  Transfers Overall transfer level: Modified independent Equipment used: None Transfers: Sit to/from Stand Sit to Stand: Modified independent (Device/Increase time)              Balance Overall balance assessment: Mild deficits observed, not formally tested                                         ADL either performed or assessed with clinical judgement   ADL Overall ADL's : Needs  assistance/impaired Eating/Feeding: Independent;Sitting                                   Functional mobility during ADLs: Supervision/safety General ADL Comments: Session focused on activity tolerance and breathing exercises     Vision       Perception     Praxis      Cognition Arousal/Alertness: Awake/alert Behavior During Therapy: WFL for tasks assessed/performed Overall Cognitive Status: Within Functional Limits for tasks assessed                                          Exercises Exercises: Other exercises Other Exercises Other Exercises: x20 flutter valve   Shoulder Instructions       General Comments      Pertinent Vitals/ Pain       Pain Assessment: Faces Faces Pain Scale: Hurts a little bit Pain Location: B feet Pain Descriptors / Indicators: Aching;Discomfort Pain Intervention(s): Limited activity within patient's tolerance;Monitored during session  Home Living  Prior Functioning/Environment              Frequency  Min 2X/week        Progress Toward Goals  OT Goals(current goals can now be found in the care plan section)  Progress towards OT goals: Progressing toward goals  Acute Rehab OT Goals Patient Stated Goal: go home tomorrow OT Goal Formulation: With patient Time For Goal Achievement: 12/01/19 Potential to Achieve Goals: Good  Plan Discharge plan remains appropriate    Co-evaluation                 AM-PAC OT "6 Clicks" Daily Activity     Outcome Measure   Help from another person eating meals?: None Help from another person taking care of personal grooming?: A Little Help from another person toileting, which includes using toliet, bedpan, or urinal?: A Little Help from another person bathing (including washing, rinsing, drying)?: A Little Help from another person to put on and taking off regular upper body clothing?: A  Little Help from another person to put on and taking off regular lower body clothing?: A Little 6 Click Score: 19    End of Session    OT Visit Diagnosis: Other (comment) (decreased activity tolerance, cardiopulm status)   Activity Tolerance Patient tolerated treatment well   Patient Left in bed;with call bell/phone within reach;Other (comment) (sitting EOB eating)   Nurse Communication Mobility status        Time: 4401-0272 OT Time Calculation (min): 11 min  Charges: OT General Charges $OT Visit: 1 Visit OT Treatments $Therapeutic Exercise: 8-22 mins  August Luz, OTR/L    Phylliss Bob 11/19/2019, 3:40 PM

## 2019-11-20 ENCOUNTER — Inpatient Hospital Stay (HOSPITAL_COMMUNITY): Payer: Medicare Other

## 2019-11-20 DIAGNOSIS — R7989 Other specified abnormal findings of blood chemistry: Secondary | ICD-10-CM | POA: Diagnosis not present

## 2019-11-20 LAB — CBC WITH DIFFERENTIAL/PLATELET
Abs Immature Granulocytes: 0.04 10*3/uL (ref 0.00–0.07)
Basophils Absolute: 0 10*3/uL (ref 0.0–0.1)
Basophils Relative: 0 %
Eosinophils Absolute: 0 10*3/uL (ref 0.0–0.5)
Eosinophils Relative: 0 %
HCT: 33.4 % — ABNORMAL LOW (ref 39.0–52.0)
Hemoglobin: 10.5 g/dL — ABNORMAL LOW (ref 13.0–17.0)
Immature Granulocytes: 1 %
Lymphocytes Relative: 14 %
Lymphs Abs: 0.8 10*3/uL (ref 0.7–4.0)
MCH: 30.3 pg (ref 26.0–34.0)
MCHC: 31.4 g/dL (ref 30.0–36.0)
MCV: 96.3 fL (ref 80.0–100.0)
Monocytes Absolute: 0.3 10*3/uL (ref 0.1–1.0)
Monocytes Relative: 5 %
Neutro Abs: 4.4 10*3/uL (ref 1.7–7.7)
Neutrophils Relative %: 80 %
Platelets: 263 10*3/uL (ref 150–400)
RBC: 3.47 MIL/uL — ABNORMAL LOW (ref 4.22–5.81)
RDW: 14.5 % (ref 11.5–15.5)
WBC: 5.5 10*3/uL (ref 4.0–10.5)
nRBC: 0.4 % — ABNORMAL HIGH (ref 0.0–0.2)

## 2019-11-20 LAB — COMPREHENSIVE METABOLIC PANEL
ALT: 44 U/L (ref 0–44)
AST: 21 U/L (ref 15–41)
Albumin: 3.3 g/dL — ABNORMAL LOW (ref 3.5–5.0)
Alkaline Phosphatase: 63 U/L (ref 38–126)
Anion gap: 11 (ref 5–15)
BUN: 32 mg/dL — ABNORMAL HIGH (ref 8–23)
CO2: 25 mmol/L (ref 22–32)
Calcium: 9.4 mg/dL (ref 8.9–10.3)
Chloride: 100 mmol/L (ref 98–111)
Creatinine, Ser: 1.51 mg/dL — ABNORMAL HIGH (ref 0.61–1.24)
GFR calc Af Amer: 51 mL/min — ABNORMAL LOW (ref >60)
GFR calc non Af Amer: 44 mL/min — ABNORMAL LOW (ref >60)
Glucose, Bld: 194 mg/dL — ABNORMAL HIGH (ref 70–99)
Potassium: 4.7 mmol/L (ref 3.5–5.1)
Sodium: 136 mmol/L (ref 135–145)
Total Bilirubin: 0.5 mg/dL (ref 0.3–1.2)
Total Protein: 6.5 g/dL (ref 6.5–8.1)

## 2019-11-20 LAB — CULTURE, BLOOD (ROUTINE X 2)
Culture: NO GROWTH
Special Requests: ADEQUATE

## 2019-11-20 LAB — CULTURE, RESPIRATORY W GRAM STAIN
Culture: NORMAL
Gram Stain: NONE SEEN

## 2019-11-20 LAB — GLUCOSE, CAPILLARY
Glucose-Capillary: 167 mg/dL — ABNORMAL HIGH (ref 70–99)
Glucose-Capillary: 214 mg/dL — ABNORMAL HIGH (ref 70–99)

## 2019-11-20 LAB — C-REACTIVE PROTEIN: CRP: 2.2 mg/dL — ABNORMAL HIGH (ref <1.0)

## 2019-11-20 LAB — MAGNESIUM: Magnesium: 2.2 mg/dL (ref 1.7–2.4)

## 2019-11-20 LAB — D-DIMER, QUANTITATIVE: D-Dimer, Quant: 1.85 ug/mL-FEU — ABNORMAL HIGH (ref 0.00–0.50)

## 2019-11-20 LAB — PROCALCITONIN: Procalcitonin: 6.61 ng/mL

## 2019-11-20 LAB — BRAIN NATRIURETIC PEPTIDE: B Natriuretic Peptide: 259.9 pg/mL — ABNORMAL HIGH (ref 0.0–100.0)

## 2019-11-20 MED ORDER — DEXAMETHASONE 6 MG PO TABS: 6.0000 mg | ORAL_TABLET | Freq: Every day | ORAL | 0 refills | Status: DC

## 2019-11-20 MED ORDER — AMOXICILLIN-POT CLAVULANATE 875-125 MG PO TABS: 1.0000 | ORAL_TABLET | Freq: Two times a day (BID) | ORAL | 0 refills | Status: DC

## 2019-11-20 MED ORDER — AZITHROMYCIN 500 MG PO TABS: 500.0000 mg | ORAL_TABLET | Freq: Every day | ORAL | 0 refills | Status: DC

## 2019-11-20 MED ORDER — SACCHAROMYCES BOULARDII 250 MG PO CAPS: 250.0000 mg | ORAL_CAPSULE | Freq: Two times a day (BID) | ORAL | 0 refills | Status: DC

## 2019-11-20 NOTE — Discharge Summary (Signed)
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 in length. Several additional subcentimeter hypodensities are too small to characterize. There is no hydronephrosis on either side. There is symmetric enhancement and excretion of contrast by both kidneys. Mild bilateral perinephric stranding, nonspecific. Correlation with urinalysis recommended to exclude UTI. The visualized ureters and urinary bladder appear unremarkable. Stomach/Bowel: There is severe sigmoid diverticulosis without active inflammatory changes. There is no bowel obstruction or active inflammation. The appendix is normal. Vascular/Lymphatic: There is advanced aortoiliac atherosclerotic disease. 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 The graft appears patent. There is advanced atherosclerotic calcification of the common femoral arteries bilaterally, right greater left. The IVC is unremarkable. No portal venous gas. There is no adenopathy. Reproductive: Prostatectomy. 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 Musculoskeletal: There is degenerative changes of the spine. No acute osseous pathology. IMPRESSION: 1. A 2.6 x 2.7 cm cavitary nodule in the superior segment of the right lower lobe may represent an abscess, an infected pneumatocele. Fungal infection, TB or cavitary neoplasm are not excluded. Clinical correlation and follow-up to resolution is recommended. 2. Emphysema with slight progression of the interstitial lung disease since the prior CT. 3. Fatty liver with possible early changes of cirrhosis. Elastography may provide better evaluation. 4. Gallbladder tumefactive sludge versus noncalcified stones. Diffuse gallbladder wall thickening and small pericholecystic fluid may be related to underlying liver disease although acute cholecystitis is not excluded. Further evaluation with right upper quadrant ultrasound recommended. 5. Severe sigmoid diverticulosis. No bowel obstruction. Normal appendix. 6. Aortic Atherosclerosis (ICD10-I70.0) and Emphysema (ICD10-J43.9). Electronically Signed   By: Anner Crete M.D.   On: 11/15/2019 19:11   NM Hepatobiliary Liver Func  Result Date: 11/17/2019 CLINICAL DATA:  Nausea and vomiting. Cholelithiasis and choledocholithiasis. EXAM: NUCLEAR MEDICINE HEPATOBILIARY IMAGING TECHNIQUE: Sequential images of the abdomen were obtained out to 60 minutes following intravenous administration of radiopharmaceutical. RADIOPHARMACEUTICALS:  5.5 mCi Tc-12m Choletec IV COMPARISON:  None. FINDINGS: Prompt uptake and biliary excretion of activity by the liver is seen. Gallbladder activity is visualized,  consistent with patency of cystic duct. Biliary activity passes into small bowel, consistent with patent common bile duct. Reflux of biliary activity into the stomach is noted. IMPRESSION: Patency of both cystic and common bile ducts is demonstrated. Bile reflux noted. Electronically Signed   By: JMarlaine HindM.D.   On: 11/17/2019 15:15   CT Abdomen Pelvis W Contrast  Result Date: 11/15/2019 CLINICAL DATA:  79year old male with pneumonia. Concern for abscess or effusion. EXAM: CT CHEST, ABDOMEN, AND PELVIS WITH CONTRAST TECHNIQUE: Multidetector CT imaging of the chest, abdomen and pelvis was performed following the standard protocol during bolus administration of intravenous contrast. CONTRAST:  1046mOMNIPAQUE IOHEXOL 300 MG/ML  SOLN COMPARISON:  Chest CT dated 02/14/2018. Chest radiograph dated 11/15/2019. FINDINGS: CT CHEST FINDINGS Cardiovascular: There is no cardiomegaly or pericardial  effusion. Advanced 3 vessel coronary vascular calcification. There is advanced calcified and noncalcified plaque of the thoracic aorta. No aneurysmal dilatation or dissection. The central pulmonary arteries appear patent. Mediastinum/Nodes: Right hilar adenopathy measuring 13 mm. Subcarinal lymph node measures 10 mm in short axis. The esophagus and the thyroid gland are grossly unremarkable. No mediastinal fluid collection. Lungs/Pleura: There is background of emphysema. Bibasilar subpleural reticulation as seen on the prior CT in keeping with interstitial lung disease. Overall slight progression of the fibrotic changes since the prior CT. There is a 2.6 x 2.7 cm cavitary nodule in the superior segment of the right lower lobe which may represent an abscess, an infected pneumatocele. Fungal infection, TB or cavitary neoplasm are not excluded. Clinical correlation and follow-up to resolution is recommended. Several scattered bilateral lower lobe nodular densities, present on the prior CT. No lobar consolidation, pleural  effusion, or pneumothorax. The central airways are patent. Musculoskeletal: No chest wall mass or suspicious bone lesions identified. CT ABDOMEN PELVIS FINDINGS No intra-abdominal free air or free fluid. Hepatobiliary: There is fatty infiltration of the liver. Slight irregularity of the liver contour may represent early changes of cirrhosis. Elastography may provide better evaluation. There is tumefactive sludge versus noncalcified stones within the gallbladder. There is diffuse gallbladder wall thickening and small pericholecystic fluid. Findings may be related to underlying liver disease although acute cholecystitis is not excluded. Further evaluation with right upper quadrant ultrasound recommended. Pancreas: Unremarkable. No pancreatic ductal dilatation or surrounding inflammatory changes. Spleen: Normal in size without focal abnormality. Adrenals/Urinary Tract: The adrenal glands are unremarkable. Multiple bilateral renal cysts with the largest a lobulated appearing left renal interpolar cyst measuring up to 13 cm in length. Several additional subcentimeter hypodensities are too small to characterize. There is no hydronephrosis on either side. There is symmetric enhancement and excretion of contrast by both kidneys. Mild bilateral perinephric stranding, nonspecific. Correlation with urinalysis recommended to exclude UTI. The visualized ureters and urinary bladder appear unremarkable. Stomach/Bowel: There is severe sigmoid diverticulosis without active inflammatory changes. There is no bowel obstruction or active inflammation. The appendix is normal. Vascular/Lymphatic: There is advanced aortoiliac atherosclerotic disease. 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 The graft appears patent. There is advanced atherosclerotic calcification of the common femoral arteries bilaterally, right greater left. The IVC is unremarkable. No portal venous gas. There is no  adenopathy. Reproductive: Prostatectomy. 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 Musculoskeletal: There is degenerative changes of the spine. No acute osseous pathology. IMPRESSION: 1. A 2.6 x 2.7 cm cavitary nodule in the superior segment of the right lower lobe may represent an abscess, an infected pneumatocele. Fungal infection, TB or cavitary neoplasm are not excluded. Clinical correlation and follow-up to resolution is recommended. 2. Emphysema with slight progression of the interstitial lung disease since the prior CT. 3. Fatty liver with possible early changes of cirrhosis. Elastography may provide better evaluation. 4. Gallbladder tumefactive sludge versus noncalcified stones. Diffuse gallbladder wall thickening and small pericholecystic fluid may be related to underlying liver disease although acute cholecystitis is not excluded. Further evaluation with right upper quadrant ultrasound recommended. 5. Severe sigmoid diverticulosis. No bowel obstruction. Normal appendix. 6. Aortic Atherosclerosis (ICD10-I70.0) and Emphysema (ICD10-J43.9). Electronically Signed   By: Anner Crete M.D.   On: 11/15/2019 19:11   US RENAL  Result Date: 11/18/2019 CLINICAL DATA:  Acute renal injury, COVID-19 positivity EXAM: RENAL / URINARY TRACT ULTRASOUND COMPLETE COMPARISON:  11/15/2019 FINDINGS: Right Kidney: Renal measurements: 12.3 x 5.1 x 4.3 cm. = volume: 140 mL. Less than 10 cysts are noted. The largest of these measures 2.7 cm in the midportion of the right kidney. Left Kidney: Renal measurements: 14.3 x 5.7 x 5.5 cm. = volume: 233 mL. Less than 10 cysts are noted. The largest of these measures 7.3 cm in greatest dimension. These are stable from the prior CT examination Bladder: Appears normal for degree of bladder distention. Other: Gallbladder is partially distended with echogenic material within similar to that seen on prior exam consistent with gallbladder sludge and stones.  IMPRESSION: Gallbladder sludge and stones. Bilateral renal cysts which are simple in nature. No obstructive changes are noted. Electronically Signed   By: Inez Catalina M.D.   On: 11/18/2019 03:03   MR ABDOMEN MRCP WO CONTRAST  Result Date: 11/17/2019 CLINICAL DATA:  Cholelithiasis EXAM: MRI ABDOMEN WITHOUT CONTRAST  (INCLUDING MRCP) TECHNIQUE: Multiplanar multisequence MR imaging of the abdomen was performed. Heavily T2-weighted images of the biliary and pancreatic ducts were obtained, and three-dimensional MRCP images were rendered by post processing. COMPARISON:  CT chest, abdomen and pelvis of November 15, 2019 FINDINGS: Lower chest: Small effusions at the lung bases. As basilar airspace disease and parenchymal findings demonstrated on the recent chest CT are not well evaluated on the MRI. Hepatobiliary: Mild hepatic steatosis. No focal, suspicious hepatic lesion, assessment limited by the noncontrast imaging. Pericholecystic stranding and signs of wall thickening. Mild biliary duct distension with numerous filling defects in the common bile duct compatible with multiple small stones. Largest approximately 5 mm. At least 4-5 additional small biliary calculi in the common bile duct. Pancreas:  No ductal dilation.  No peripancreatic inflammation. Spleen: Skin normal in size and contour without focal, suspicious lesion. Adrenals/Urinary Tract:  Adrenal glands are normal spleen. Bilateral renal cysts. Asymmetric albeit slightly Peri nephric stranding greater on the LEFT than the RIGHT. Dilated collecting systems in the upper pole and in the lower pole LEFT kidney perhaps due to mass-effect upon collecting systems in the setting of large renal sinus cysts with similar appearance to prior imaging studies. Stomach/Bowel: Stomach gastrointestinal tract with limited assessment, unremarkable to the extent evaluated. Vascular/Lymphatic: No aneurysmal dilation of the abdominal aorta post aortic aneurysmal repair and grafting  no adenopathy. Other:  None. Musculoskeletal: No suspicious bone lesions identified. IMPRESSION: 1. Choledocholithiasis with mild biliary duct dilation as described. 2. Pericholecystic stranding and signs of wall thickening, remaining suspicious for acute cholecystitis. If there are discordant clinical findings, HIDA scan may be helpful for further assessment. 3. Renal sinus cysts and stable collecting system dilation on the LEFT with renal cysts on the RIGHT and mild perinephric stranding, nonspecific and similar to previous imaging studies. 4. Small effusions at the lung bases. 5. Mild hepatic steatosis. Electronically Signed   By: Zetta Bills M.D.   On: 11/17/2019 08:06   DG Chest Port 1 View  Result Date: 11/16/2019 CLINICAL DATA:  Shortness of breath and fever EXAM: PORTABLE CHEST 1 VIEW COMPARISON:  Chest radiograph and chest CT November 15, 2019 FINDINGS: There is a degree of underlying fibrosis. The cavitary lesion in the right lower lobe seen 1 day prior is less well seen by radiography although is evident, measuring 2.5 x 2.4 cm. There is a degree of lower lobe bronchiectatic change. No new opacity evident. Heart size and pulmonary vascularity are normal. Lymph node prominence seen on CT is not appreciable by radiography. There is aortic atherosclerosis. No bone  lesions appreciable. IMPRESSION: Cavitary lesion right lower lobe, much better seen on recent CT. This nodular lesion measures 2.5 x 2.4 cm on portable radiographic examination. There is an underlying degree of fibrosis in the bases. No new opacity evident. Cardiac silhouette stable. Aortic Atherosclerosis (ICD10-I70.0). Electronically Signed   By: Lowella Grip III M.D.   On: 11/16/2019 09:34   DG Chest Port 1 View  Result Date: 11/15/2019 CLINICAL DATA:  Evaluate for infection. EXAM: PORTABLE CHEST 1 VIEW COMPARISON:  07/27/2015 FINDINGS: Lungs are adequately inflated without effusion or pneumothorax. Mild increased density over the  right hilum/perihilar region. Cardiomediastinal silhouette and remainder the exam is unchanged. IMPRESSION: Minimal increased density over the right hilar/perihilar region. Medial airspace process is possible. Consider PA and lateral chest radiograph for better evaluation. Electronically Signed   By: Marin Olp M.D.   On: 11/15/2019 17:04   DG ERCP BILIARY & PANCREATIC DUCTS  Result Date: 11/19/2019 CLINICAL DATA:  ERCP for bile duct stones. EXAM: ERCP TECHNIQUE: Multiple spot images obtained with the fluoroscopic device and submitted for interpretation post-procedure. COMPARISON:  MRCP-11/17/2019; nuclear medicine HIDA scan-11/17/2019 FINDINGS: Eight spot intraoperative fluoroscopic images of the right upper abdominal quadrant during ERCP are provided for review Initial image demonstrates an ERCP probe overlying the right upper abdominal quadrant Subsequent images demonstrate selective cannulation opacification of the common bile duct. There are several filling defects within the CBD, potentially representative of air bubbles versus choledocholithiasis. Subsequent images demonstrate insufflation of a balloon within the central aspect of the CBD with subsequent biliary sweeping and presumed sphincterotomy. There is minimal opacification the cystic duct with passage of contrast to the level of the gallbladder lumen. There is minimal opacification intrahepatic biliary tree which appears nondilated. IMPRESSION: ERCP with biliary sweeping and presumed sphincterotomy as above. These images were submitted for radiologic interpretation only. Please see the procedural report for the amount of contrast and the fluoroscopy time utilized. Electronically Signed   By: Sandi Mariscal M.D.   On: 11/19/2019 07:40   ECHOCARDIOGRAM LIMITED  Result Date: 11/16/2019    ECHOCARDIOGRAM LIMITED REPORT   Patient Name:   Cody Moss Date of Exam: 11/16/2019 Medical Rec #:  [id number]         Height:       70.0 in Accession #:     [id number]        Weight:       187.8 lb Date of Birth:  Feb 18, 1941         BSA:          2.032 m Patient Age:    59 years          BP:           103/63 mmHg Patient Gender: M                 HR:           58 bpm. Exam Location:  Inpatient Procedure: Limited Echo, Color Doppler and Cardiac Doppler Indications:    CAD Native Vessel i25.10  History:        Patient has prior history of Echocardiogram examinations, most                 recent 07/29/2015. CAD; Risk Factors:Hypertension, Diabetes and                 Dyslipidemia. COVID+ at time of study.  Sonographer:    Raquel Sarna Senior RDCS Referring Phys: [facility]  1. Left ventricular ejection fraction, by estimation, is 55 to 60%. The left ventricle has normal function. The left ventricle has no regional wall motion abnormalities. Left ventricular diastolic function could not be evaluated.  2. Right ventricular systolic function is normal. The right ventricular size is normal. There is mildly elevated pulmonary artery systolic pressure. The estimated right ventricular systolic pressure is 56.8 mmHg.  3. The mitral valve is grossly normal. Mild mitral valve regurgitation. No evidence of mitral stenosis.  4. The aortic valve is tricuspid. Aortic valve regurgitation is trivial. Mild aortic valve stenosis. Aortic valve mean gradient measures 9.9 mmHg. Aortic valve Vmax measures 2.15 m/s.  5. The inferior vena cava is dilated in size with <50% respiratory variability, suggesting right atrial pressure of 15 mmHg. Comparison(s): No significant change from prior study. FINDINGS  Left Ventricle: Left ventricular ejection fraction, by estimation, is 55 to 60%. The left ventricle has normal function. The left ventricle has no regional wall motion abnormalities. The left ventricular internal cavity size was normal in size. There is  no left ventricular hypertrophy. Right Ventricle: The right ventricular size is normal. No increase in right ventricular wall  thickness. Right ventricular systolic function is normal. There is mildly elevated pulmonary artery systolic pressure. The tricuspid regurgitant velocity is 2.63  m/s, and with an assumed right atrial pressure of 15 mmHg, the estimated right ventricular systolic pressure is 12.7 mmHg. Left Atrium: Left atrial size was normal in size. Right Atrium: Right atrial size was normal in size. Pericardium: Trivial pericardial effusion is present. Presence of pericardial fat pad. Mitral Valve: The mitral valve is grossly normal. Mild mitral valve regurgitation. No evidence of mitral valve stenosis. Tricuspid Valve: The tricuspid valve is grossly normal. Tricuspid valve regurgitation is trivial. No evidence of tricuspid stenosis. Aortic Valve: The aortic valve is tricuspid. Aortic valve regurgitation is trivial. Mild aortic stenosis is present. Aortic valve mean gradient measures 9.9 mmHg. Aortic valve peak gradient measures 18.4 mmHg. Aortic valve area, by VTI measures 1.26 cm. Pulmonic Valve: The pulmonic valve was grossly normal. Pulmonic valve regurgitation is trivial. No evidence of pulmonic stenosis. Aorta: The aortic root is normal in size and structure. Venous: The inferior vena cava is dilated in size with less than 50% respiratory variability, suggesting right atrial pressure of 15 mmHg. IAS/Shunts: The atrial septum is grossly normal. LEFT VENTRICLE PLAX 2D LVOT diam:     2.10 cm LV SV:         64 LV SV Index:   31 LVOT Area:     3.46 cm  LV Volumes (MOD) LV vol d, MOD A4C: 145.0 ml LV vol s, MOD A4C: 67.7 ml LV SV MOD A4C:     145.0 ml RIGHT VENTRICLE RV S prime:     9.14 cm/s TAPSE (M-mode): 2.3 cm AORTIC VALVE AV Area (Vmax):    1.11 cm AV Area (Vmean):   1.14 cm AV Area (VTI):     1.26 cm AV Vmax:           214.63 cm/s AV Vmean:          148.039 cm/s AV VTI:            0.506 m AV Peak Grad:      18.4 mmHg AV Mean Grad:      9.9 mmHg LVOT Vmax:         68.50 cm/s LVOT Vmean:        48.900 cm/s LVOT VTI:  0.184 m LVOT/AV VTI ratio: 0.36 TRICUSPID VALVE TR Peak grad:   27.7 mmHg TR Vmax:        263.00 cm/s  SHUNTS Systemic VTI:  0.18 m Systemic Diam: 2.10 cm Eleonore Chiquito MD Electronically signed by Eleonore Chiquito MD Signature Date/Time: 11/16/2019/12:16:24 PM    Final    US Abdomen Limited RUQ  Result Date: 11/15/2019 CLINICAL DATA:  80 year old male with nausea vomiting. EXAM: ULTRASOUND ABDOMEN LIMITED RIGHT UPPER QUADRANT COMPARISON:  CT abdomen pelvis dated 11/15/2019. FINDINGS: Gallbladder: There is sludge and stone within the gallbladder. There is diffuse gallbladder wall thickening and edema and a small pericholecystic fluid. Evaluation for sonographic Murphy's sign was limited as the patient was pre-medicated. Common bile duct: Diameter: 7 mm Liver: There is diffuse increased liver echogenicity most commonly seen in the setting of fatty infiltration. Superimposed inflammation or fibrosis is not excluded. Clinical correlation is recommended. Portal vein is patent on color Doppler imaging with normal direction of blood flow towards the liver. Other: None. IMPRESSION: 1. Gallbladder sludge and stones with diffuse edema as seen on the earlier CT. A hepatobiliary scintigraphy may provide better evaluation of the gallbladder if there is a high clinical concern for acute cholecystitis . 2. Fatty liver. Electronically Signed   By: Anner Crete M.D.   On: 11/15/2019 21:02    Micro Results    Recent Results (from the past 240 hour(s))  SARS Coronavirus 2 by RT PCR (hospital order, performed in [facility] hospital lab) Nasopharyngeal Nasopharyngeal Swab     Status: Abnormal   Collection Time: 11/15/19  4:55 PM   Specimen: Nasopharyngeal Swab  Result Value Ref Range Status   SARS Coronavirus 2 POSITIVE (A) NEGATIVE Final    Comment: RESULT CALLED TO, READ BACK BY AND VERIFIED WITH: RN MEEKS [id number] FCP (NOTE) SARS-CoV-2 target nucleic acids are DETECTED  SARS-CoV-2 RNA is generally  detectable in upper respiratory specimens  during the acute phase of infection.  Positive results are indicative  of the presence of the identified virus, but do not rule out bacterial infection or co-infection with other pathogens not detected by the test.  Clinical correlation with patient history and  other diagnostic information is necessary to determine patient infection status.  The expected result is negative.  Fact Sheet for Patients:   StrictlyIdeas.no   Fact Sheet for Healthcare Providers:   BankingDealers.co.za    This test is not yet approved or cleared by the [place] FDA and  has been authorized for detection and/or diagnosis of SARS-CoV-2 by FDA under an Emergency Use Authorization (EUA).  This EUA will remain in effect (meaning this test can be  used) for the duration of  the COVID-19 declaration under Section 564(b)(1) of the Act, 21 U.S.C. section 360-bbb-3(b)(1), unless the authorization is terminated or revoked sooner.  Performed at [facility], [place] [address], [place], [place] [address]   Blood Culture (routine x 2)     Status: None (Preliminary result)   Collection Time: 11/15/19  5:00 PM   Specimen: BLOOD RIGHT WRIST  Result Value Ref Range Status   Specimen Description BLOOD RIGHT WRIST  Final   Special Requests   Final    BOTTLES DRAWN AEROBIC AND ANAEROBIC Blood Culture adequate volume   Culture   Final    NO GROWTH 4 DAYS Performed at [facility], [place] [address] [place], [place] [address]    Report Status PENDING  Incomplete  Blood Culture (routine x  2)     Status: Abnormal   Collection Time: 11/15/19  5:09 PM   Specimen: BLOOD  Result Value Ref Range Status   Specimen Description BLOOD RIGHT ANTECUBITAL  Final   Special Requests   Final    BOTTLES DRAWN AEROBIC AND ANAEROBIC Blood Culture results may not be optimal due to an excessive volume of blood received in culture bottles    Culture  Setup Time   Final    GRAM NEGATIVE RODS ANAEROBIC BOTTLE ONLY Organism ID to follow CRITICAL RESULT CALLED TO, READ BACK BY AND VERIFIED WITH: Sarajane Marek [id number] [phone] MLM Performed at [facility], [place] [address] [place], [place] [address]    Culture ESCHERICHIA COLI (A)  Final   Report Status 11/18/2019 FINAL  Final   Organism ID, Bacteria ESCHERICHIA COLI  Final      Susceptibility   Escherichia coli - MIC*    AMPICILLIN >=32 RESISTANT Resistant     CEFAZOLIN <=4 SENSITIVE Sensitive     CEFEPIME <=0.12 SENSITIVE Sensitive     CEFTAZIDIME <=1 SENSITIVE Sensitive     CEFTRIAXONE <=0.25 SENSITIVE Sensitive     CIPROFLOXACIN 0.5 SENSITIVE Sensitive     GENTAMICIN <=1 SENSITIVE Sensitive     IMIPENEM <=0.25 SENSITIVE Sensitive     TRIMETH/SULFA >=320 RESISTANT Resistant     AMPICILLIN/SULBACTAM 8 SENSITIVE Sensitive     PIP/TAZO <=4 SENSITIVE Sensitive     * ESCHERICHIA COLI  Blood Culture ID Panel (Reflexed)     Status: Abnormal   Collection Time: 11/15/19  5:09 PM  Result Value Ref Range Status   Enterococcus species NOT DETECTED NOT DETECTED Final   Listeria monocytogenes NOT DETECTED NOT DETECTED Final   Staphylococcus species NOT DETECTED NOT DETECTED Final   Staphylococcus aureus (BCID) NOT DETECTED NOT DETECTED Final   Streptococcus species NOT DETECTED NOT DETECTED Final   Streptococcus agalactiae NOT DETECTED NOT DETECTED Final   Streptococcus pneumoniae NOT DETECTED NOT DETECTED Final   Streptococcus pyogenes NOT DETECTED NOT DETECTED Final   Acinetobacter baumannii NOT DETECTED NOT DETECTED Final   Enterobacteriaceae species DETECTED (A) NOT DETECTED Final    Comment: Enterobacteriaceae represent a large family of gram-negative bacteria, not a single organism. CRITICAL RESULT CALLED TO, READ BACK BY AND VERIFIED WITH: PHARMD J FRENS [phone] MLM    Enterobacter cloacae complex NOT DETECTED NOT DETECTED Final   Escherichia coli DETECTED (A) NOT  DETECTED Final    Comment: CRITICAL RESULT CALLED TO, READ BACK BY AND VERIFIED WITH: PHARMD J FRENS [id number] MLM    Klebsiella oxytoca NOT DETECTED NOT DETECTED Final   Klebsiella pneumoniae NOT DETECTED NOT DETECTED Final   Proteus species NOT DETECTED NOT DETECTED Final   Serratia marcescens NOT DETECTED NOT DETECTED Final   Carbapenem resistance NOT DETECTED NOT DETECTED Final   Haemophilus influenzae NOT DETECTED NOT DETECTED Final   Neisseria meningitidis NOT DETECTED NOT DETECTED Final   Pseudomonas aeruginosa NOT DETECTED NOT DETECTED Final   Candida albicans NOT DETECTED NOT DETECTED Final   Candida glabrata NOT DETECTED NOT DETECTED Final   Candida krusei NOT DETECTED NOT DETECTED Final   Candida parapsilosis NOT DETECTED NOT DETECTED Final   Candida tropicalis NOT DETECTED NOT DETECTED Final    Comment: Performed at [facility], [place]. [address], [place], [place] [address]  Urine culture     Status: None   Collection Time: 11/15/19 10:46 PM   Specimen: Urine, Random  Result Value  Ref Range Status   Specimen Description URINE, RANDOM  Final   Special Requests NONE  Final   Culture   Final    NO GROWTH Performed at [facility], [address], [place], [place] [address]    Report Status 11/16/2019 FINAL  Final  MRSA PCR Screening     Status: None   Collection Time: 11/16/19  2:30 AM   Specimen: Nasal Mucosa; Nasopharyngeal  Result Value Ref Range Status   MRSA by PCR NEGATIVE NEGATIVE Final    Comment:        The GeneXpert MRSA Assay (FDA approved for NASAL specimens only), is one component of a comprehensive MRSA colonization surveillance program. It is not intended to diagnose MRSA infection nor to guide or monitor treatment for MRSA infections. Performed at [facility], [place] [address], [place], [place] [address]   Expectorated sputum assessment w rflx to resp cult     Status: None   Collection Time: 11/18/19 10:00 AM   Specimen:  Expectorated Sputum  Result Value Ref Range Status   Specimen Description Expect. Sput  Final   Special Requests NONE  Final   Sputum evaluation   Final    THIS SPECIMEN IS ACCEPTABLE FOR SPUTUM CULTURE Performed at [facility], [address], [place], [place] [address]    Report Status 11/18/2019 FINAL  Final  Culture, respiratory     Status: None   Collection Time: 11/18/19 10:00 AM  Result Value Ref Range Status   Specimen Description Expect. Sput  Final   Special Requests NONE Reflexed from [id number]  Final   Gram Stain   Final    NO WBC SEEN FEW BUDDING YEAST SEEN RARE GRAM VARIABLE ROD    Culture   Final    FEW Consistent with normal respiratory flora. Performed at [facility], [place] [address], [place], [place] [address]    Report Status 11/20/2019 FINAL  Final       Today   Subjective:   Cody Moss today has no headache,no chest abdominal pain,no new weakness tingling or numbness, feels much better wants to go home today.  Objective:   Blood pressure (!) 142/70, pulse 57, temperature 97.9 F (36.6 C), temperature source Oral, resp. rate (!) 24, height _0  (1.778 m), weight 85.2 kg, SpO2 95 %.   Intake/Output Summary (Last 24 hours) at 11/20/2019 1353 Last data filed at 11/20/2019 1120 Gross per 24 hour  Intake 1360 ml  Output 3350 ml  Net -1990 ml    Exam Awake Alert, Oriented x 3, No new F.N deficits, Normal affect Symmetrical Chest wall movement, Good air movement bilaterally, CTAB RRR,No Gallops,Rubs or new Murmurs, No Parasternal Heave +ve B.Sounds, Abd Soft, Non tender, No organomegaly appriciated, No rebound -guarding or rigidity. No Cyanosis, Clubbing or edema, No new Rash or bruise  Data Review   CBC w Diff:  Lab Results  Component Value Date   WBC 5.5 11/20/2019   HGB 10.5 (L) 11/20/2019   HCT 33.4 (L) 11/20/2019   PLT 263 11/20/2019   LYMPHOPCT 14 11/20/2019   MONOPCT 5 11/20/2019   EOSPCT 0 11/20/2019   BASOPCT 0  11/20/2019    CMP:  Lab Results  Component Value Date   NA 136 11/20/2019   K 4.7 11/20/2019   CL 100 11/20/2019   CO2 25 11/20/2019   BUN 32 (H) 11/20/2019   CREATININE 1.51 (H) 11/20/2019   CREATININE 1.17 03/18/2013  PROT 6.5 11/20/2019   ALBUMIN 3.3 (L) 11/20/2019   BILITOT 0.5 11/20/2019   ALKPHOS 63 11/20/2019   AST 21 11/20/2019   ALT 44 11/20/2019  .   Total Time in preparing paper work, data evaluation and todays exam - 55 minutes  Phillips Climes M.D on 11/20/2019 at 1:53 PM  [facility]   Office  [phone]

## 2019-11-20 NOTE — Plan of Care (Signed)

## 2019-11-20 NOTE — Progress Notes (Signed)
Patient was discharged home by MD order; discharged instructions review and give to patient with care notes and prescriptions; IV DIC; skin intact; patient will be escorted to the car by nurse tech via wheelchair.  

## 2019-11-20 NOTE — Discharge Instructions (Signed)
Person Under Monitoring Name: Cody Moss  Location: [address] [place] [address]   Infection Prevention Recommendations for Individuals Confirmed to have, or Being Evaluated for, 2019 Novel Coronavirus (COVID-19) Infection Who Receive Care at Home  Individuals who are confirmed to have, or are being evaluated for, COVID-19 should follow the prevention steps below until a healthcare provider or local or state health department says they can return to normal activities.  Stay home except to get medical care You should restrict activities outside your home, except for getting medical care. Do not go to work, school, or public areas, and do not use public transportation or taxis.  Call ahead before visiting your doctor Before your medical appointment, call the healthcare provider and tell them that you have, or are being evaluated for, COVID-19 infection. This will help the healthcare provider's office take steps to keep other people from getting infected. Ask your healthcare provider to call the local or state health department.  Monitor your symptoms Seek prompt medical attention if your illness is worsening (e.g., difficulty breathing). Before going to your medical appointment, call the healthcare provider and tell them that you have, or are being evaluated for, COVID-19 infection. Ask your healthcare provider to call the local or state health department.  Wear a facemask You should wear a facemask that covers your nose and mouth when you are in the same room with other people and when you visit a healthcare provider. People who live with or visit you should also wear a facemask while they are in the same room with you.  Separate yourself from other people in your home As much as possible, you should stay in a different room from other people in your home. Also, you should use a separate bathroom, if available.  Avoid sharing household items You should  not share dishes, drinking glasses, cups, eating utensils, towels, bedding, or other items with other people in your home. After using these items, you should wash them thoroughly with soap and water.  Cover your coughs and sneezes Cover your mouth and nose with a tissue when you cough or sneeze, or you can cough or sneeze into your sleeve. Throw used tissues in a lined trash can, and immediately wash your hands with soap and water for at least 20 seconds or use an alcohol-based hand rub.  Wash your [facility] your hands often and thoroughly with soap and water for at least 20 seconds. You can use an alcohol-based hand sanitizer if soap and water are not available and if your hands are not visibly dirty. Avoid touching your eyes, nose, and mouth with unwashed hands.   Prevention Steps for Caregivers and Household Members of Individuals Confirmed to have, or Being Evaluated for, COVID-19 Infection Being Cared for in the Home  If you live with, or provide care at home for, a person confirmed to have, or being evaluated for, COVID-19 infection please follow these guidelines to prevent infection:  Follow healthcare provider's instructions Make sure that you understand and can help the patient follow any healthcare provider instructions for all care.  Provide for the patient's basic needs You should help the patient with basic needs in the home and provide support for getting groceries, prescriptions, and other personal needs.  Monitor the patient's symptoms If they are getting sicker, call his or her medical provider and tell them that the patient has, or is being evaluated for, COVID-19 infection. This will help the healthcare provider's office  take steps to keep other people from getting infected. Ask the healthcare provider to call the local or state health department.  Limit the number of people who have contact with the patient  If possible, have only one caregiver for the  patient.  Other household members should stay in another home or place of residence. If this is not possible, they should stay  in another room, or be separated from the patient as much as possible. Use a separate bathroom, if available.  Restrict visitors who do not have an essential need to be in the home.  Keep older adults, very young children, and other sick people away from the patient Keep older adults, very young children, and those who have compromised immune systems or chronic health conditions away from the patient. This includes people with chronic heart, lung, or kidney conditions, diabetes, and cancer.  Ensure good ventilation Make sure that shared spaces in the home have good air flow, such as from an air conditioner or an opened window, weather permitting.  Wash your hands often  Wash your hands often and thoroughly with soap and water for at least 20 seconds. You can use an alcohol based hand sanitizer if soap and water are not available and if your hands are not visibly dirty.  Avoid touching your eyes, nose, and mouth with unwashed hands.  Use disposable paper towels to dry your hands. If not available, use dedicated cloth towels and replace them when they become wet.  Wear a facemask and gloves  Wear a disposable facemask at all times in the room and gloves when you touch or have contact with the patient's blood, body fluids, and/or secretions or excretions, such as sweat, saliva, sputum, nasal mucus, vomit, urine, or feces.  Ensure the mask fits over your nose and mouth tightly, and do not touch it during use.  Throw out disposable facemasks and gloves after using them. Do not reuse.  Wash your hands immediately after removing your facemask and gloves.  If your personal clothing becomes contaminated, carefully remove clothing and launder. Wash your hands after handling contaminated clothing.  Place all used disposable facemasks, gloves, and other waste in a lined  container before disposing them with other household waste.  Remove gloves and wash your hands immediately after handling these items.  Do not share dishes, glasses, or other household items with the patient  Avoid sharing household items. You should not share dishes, drinking glasses, cups, eating utensils, towels, bedding, or other items with a patient who is confirmed to have, or being evaluated for, COVID-19 infection.  After the person uses these items, you should wash them thoroughly with soap and water.  Wash laundry thoroughly  Immediately remove and wash clothes or bedding that have blood, body fluids, and/or secretions or excretions, such as sweat, saliva, sputum, nasal mucus, vomit, urine, or feces, on them.  Wear gloves when handling laundry from the patient.  Read and follow directions on labels of laundry or clothing items and detergent. In general, wash and dry with the warmest temperatures recommended on the label.  Clean all areas the individual has used often  Clean all touchable surfaces, such as counters, tabletops, doorknobs, bathroom fixtures, toilets, phones, keyboards, tablets, and bedside tables, every day. Also, clean any surfaces that may have blood, body fluids, and/or secretions or excretions on them.  Wear gloves when cleaning surfaces the patient has come in contact with.  Use a diluted bleach solution (e.g., dilute bleach with 1 part  bleach and 10 parts water) or a household disinfectant with a label that says EPA-registered for coronaviruses. To make a bleach solution at home, add 1 tablespoon of bleach to 1 quart (4 cups) of water. For a larger supply, add  cup of bleach to 1 gallon (16 cups) of water.  Read labels of cleaning products and follow recommendations provided on product labels. Labels contain instructions for safe and effective use of the cleaning product including precautions you should take when applying the product, such as wearing gloves or  eye protection and making sure you have good ventilation during use of the product.  Remove gloves and wash hands immediately after cleaning.  Monitor yourself for signs and symptoms of illness Caregivers and household members are considered close contacts, should monitor their health, and will be asked to limit movement outside of the home to the extent possible. Follow the monitoring steps for close contacts listed on the symptom monitoring form.   ? If you have additional questions, contact your local health department or call the epidemiologist on call at [phone] (available 24/7). ? This guidance is subject to change. For the most up-to-date guidance from [facility], please refer to their website: [email or web address]

## 2019-11-20 NOTE — Progress Notes (Signed)
Lower extremity venous bilateral study completed.   Results relayed to RN.   See Cv Proc for preliminary results.   Cody Moss

## 2019-11-25 ENCOUNTER — Encounter: Payer: Medicare Other | Admitting: Vascular Surgery

## 2019-12-04 ENCOUNTER — Emergency Department (HOSPITAL_COMMUNITY): Payer: Medicare Other

## 2019-12-04 ENCOUNTER — Encounter (HOSPITAL_COMMUNITY): Payer: Self-pay | Admitting: Pediatrics

## 2019-12-04 ENCOUNTER — Inpatient Hospital Stay (HOSPITAL_COMMUNITY): Payer: Medicare Other

## 2019-12-04 ENCOUNTER — Other Ambulatory Visit: Payer: Self-pay

## 2019-12-04 ENCOUNTER — Inpatient Hospital Stay (HOSPITAL_COMMUNITY)
Admission: EM | Admit: 2019-12-04 | Discharge: 2019-12-09 | DRG: 177 | Disposition: A | Discharge status: Home Health | Payer: Medicare Other | Attending: Internal Medicine | Admitting: Internal Medicine

## 2019-12-04 DIAGNOSIS — K31819 Angiodysplasia of stomach and duodenum without bleeding: Secondary | ICD-10-CM | POA: Diagnosis present

## 2019-12-04 DIAGNOSIS — Y838 Other surgical procedures as the cause of abnormal reaction of the patient, or of later complication, without mention of misadventure at the time of the procedure: Secondary | ICD-10-CM | POA: Diagnosis present

## 2019-12-04 DIAGNOSIS — D62 Acute posthemorrhagic anemia: Secondary | ICD-10-CM | POA: Diagnosis present

## 2019-12-04 DIAGNOSIS — K222 Esophageal obstruction: Secondary | ICD-10-CM | POA: Diagnosis present

## 2019-12-04 DIAGNOSIS — R109 Unspecified abdominal pain: Secondary | ICD-10-CM | POA: Diagnosis present

## 2019-12-04 DIAGNOSIS — I2510 Atherosclerotic heart disease of native coronary artery without angina pectoris: Secondary | ICD-10-CM | POA: Diagnosis present

## 2019-12-04 DIAGNOSIS — E1165 Type 2 diabetes mellitus with hyperglycemia: Secondary | ICD-10-CM | POA: Diagnosis present

## 2019-12-04 DIAGNOSIS — J1282 Pneumonia due to coronavirus disease 2019: Secondary | ICD-10-CM | POA: Diagnosis present

## 2019-12-04 DIAGNOSIS — M545 Low back pain, unspecified: Secondary | ICD-10-CM | POA: Diagnosis present

## 2019-12-04 DIAGNOSIS — U071 COVID-19: Secondary | ICD-10-CM | POA: Diagnosis present

## 2019-12-04 DIAGNOSIS — K449 Diaphragmatic hernia without obstruction or gangrene: Secondary | ICD-10-CM | POA: Diagnosis present

## 2019-12-04 DIAGNOSIS — K91840 Postprocedural hemorrhage and hematoma of a digestive system organ or structure following a digestive system procedure: Secondary | ICD-10-CM | POA: Diagnosis present

## 2019-12-04 DIAGNOSIS — E872 Acidosis, unspecified: Secondary | ICD-10-CM | POA: Diagnosis present

## 2019-12-04 DIAGNOSIS — Z23 Encounter for immunization: Secondary | ICD-10-CM | POA: Diagnosis present

## 2019-12-04 DIAGNOSIS — K91841 Postprocedural hemorrhage and hematoma of a digestive system organ or structure following other procedure: Secondary | ICD-10-CM | POA: Diagnosis not present

## 2019-12-04 DIAGNOSIS — Z66 Do not resuscitate: Secondary | ICD-10-CM | POA: Diagnosis present

## 2019-12-04 DIAGNOSIS — Z8546 Personal history of malignant neoplasm of prostate: Secondary | ICD-10-CM | POA: Diagnosis not present

## 2019-12-04 DIAGNOSIS — R195 Other fecal abnormalities: Secondary | ICD-10-CM | POA: Diagnosis not present

## 2019-12-04 DIAGNOSIS — K219 Gastro-esophageal reflux disease without esophagitis: Secondary | ICD-10-CM | POA: Diagnosis present

## 2019-12-04 DIAGNOSIS — E860 Dehydration: Secondary | ICD-10-CM | POA: Diagnosis present

## 2019-12-04 DIAGNOSIS — E782 Mixed hyperlipidemia: Secondary | ICD-10-CM | POA: Diagnosis present

## 2019-12-04 DIAGNOSIS — J8410 Pulmonary fibrosis, unspecified: Secondary | ICD-10-CM | POA: Diagnosis present

## 2019-12-04 DIAGNOSIS — E1169 Type 2 diabetes mellitus with other specified complication: Secondary | ICD-10-CM | POA: Diagnosis present

## 2019-12-04 DIAGNOSIS — N135 Crossing vessel and stricture of ureter without hydronephrosis: Secondary | ICD-10-CM | POA: Diagnosis present

## 2019-12-04 DIAGNOSIS — Z9889 Other specified postprocedural states: Secondary | ICD-10-CM | POA: Diagnosis not present

## 2019-12-04 DIAGNOSIS — B3781 Candidal esophagitis: Secondary | ICD-10-CM | POA: Diagnosis present

## 2019-12-04 DIAGNOSIS — K259 Gastric ulcer, unspecified as acute or chronic, without hemorrhage or perforation: Secondary | ICD-10-CM | POA: Diagnosis present

## 2019-12-04 DIAGNOSIS — Z923 Personal history of irradiation: Secondary | ICD-10-CM

## 2019-12-04 DIAGNOSIS — E875 Hyperkalemia: Secondary | ICD-10-CM | POA: Diagnosis present

## 2019-12-04 DIAGNOSIS — J9601 Acute respiratory failure with hypoxia: Secondary | ICD-10-CM | POA: Diagnosis present

## 2019-12-04 DIAGNOSIS — M79604 Pain in right leg: Secondary | ICD-10-CM | POA: Diagnosis present

## 2019-12-04 DIAGNOSIS — Z79899 Other long term (current) drug therapy: Secondary | ICD-10-CM

## 2019-12-04 DIAGNOSIS — Z955 Presence of coronary angioplasty implant and graft: Secondary | ICD-10-CM

## 2019-12-04 DIAGNOSIS — I10 Essential (primary) hypertension: Secondary | ICD-10-CM | POA: Diagnosis present

## 2019-12-04 DIAGNOSIS — J984 Other disorders of lung: Secondary | ICD-10-CM

## 2019-12-04 DIAGNOSIS — Z7952 Long term (current) use of systemic steroids: Secondary | ICD-10-CM

## 2019-12-04 DIAGNOSIS — Z87891 Personal history of nicotine dependence: Secondary | ICD-10-CM

## 2019-12-04 DIAGNOSIS — M79605 Pain in left leg: Secondary | ICD-10-CM | POA: Diagnosis present

## 2019-12-04 DIAGNOSIS — K279 Peptic ulcer, site unspecified, unspecified as acute or chronic, without hemorrhage or perforation: Secondary | ICD-10-CM | POA: Diagnosis not present

## 2019-12-04 DIAGNOSIS — R7989 Other specified abnormal findings of blood chemistry: Secondary | ICD-10-CM | POA: Diagnosis not present

## 2019-12-04 DIAGNOSIS — Z7982 Long term (current) use of aspirin: Secondary | ICD-10-CM

## 2019-12-04 DIAGNOSIS — K8020 Calculus of gallbladder without cholecystitis without obstruction: Secondary | ICD-10-CM | POA: Diagnosis present

## 2019-12-04 DIAGNOSIS — M109 Gout, unspecified: Secondary | ICD-10-CM | POA: Diagnosis present

## 2019-12-04 DIAGNOSIS — Z7984 Long term (current) use of oral hypoglycemic drugs: Secondary | ICD-10-CM

## 2019-12-04 DIAGNOSIS — E1151 Type 2 diabetes mellitus with diabetic peripheral angiopathy without gangrene: Secondary | ICD-10-CM | POA: Diagnosis present

## 2019-12-04 DIAGNOSIS — K3189 Other diseases of stomach and duodenum: Secondary | ICD-10-CM | POA: Diagnosis not present

## 2019-12-04 DIAGNOSIS — K922 Gastrointestinal hemorrhage, unspecified: Secondary | ICD-10-CM | POA: Diagnosis not present

## 2019-12-04 LAB — CBC WITH DIFFERENTIAL/PLATELET
Abs Immature Granulocytes: 0.02 10*3/uL (ref 0.00–0.07)
Basophils Absolute: 0 10*3/uL (ref 0.0–0.1)
Basophils Relative: 0 %
Eosinophils Absolute: 0 10*3/uL (ref 0.0–0.5)
Eosinophils Relative: 0 %
HCT: 32.8 % — ABNORMAL LOW (ref 39.0–52.0)
Hemoglobin: 10.3 g/dL — ABNORMAL LOW (ref 13.0–17.0)
Immature Granulocytes: 0 %
Lymphocytes Relative: 11 %
Lymphs Abs: 0.7 10*3/uL (ref 0.7–4.0)
MCH: 30.3 pg (ref 26.0–34.0)
MCHC: 31.4 g/dL (ref 30.0–36.0)
MCV: 96.5 fL (ref 80.0–100.0)
Monocytes Absolute: 0.4 10*3/uL (ref 0.1–1.0)
Monocytes Relative: 6 %
Neutro Abs: 5.2 10*3/uL (ref 1.7–7.7)
Neutrophils Relative %: 83 %
Platelets: 130 10*3/uL — ABNORMAL LOW (ref 150–400)
RBC: 3.4 MIL/uL — ABNORMAL LOW (ref 4.22–5.81)
RDW: 14.6 % (ref 11.5–15.5)
WBC: 6.4 10*3/uL (ref 4.0–10.5)
nRBC: 0 % (ref 0.0–0.2)

## 2019-12-04 LAB — COMPREHENSIVE METABOLIC PANEL
ALT: 21 U/L (ref 0–44)
AST: 35 U/L (ref 15–41)
Albumin: 2.7 g/dL — ABNORMAL LOW (ref 3.5–5.0)
Alkaline Phosphatase: 48 U/L (ref 38–126)
Anion gap: 17 — ABNORMAL HIGH (ref 5–15)
BUN: 76 mg/dL — ABNORMAL HIGH (ref 8–23)
CO2: 19 mmol/L — ABNORMAL LOW (ref 22–32)
Calcium: 9.9 mg/dL (ref 8.9–10.3)
Chloride: 99 mmol/L (ref 98–111)
Creatinine, Ser: 1.73 mg/dL — ABNORMAL HIGH (ref 0.61–1.24)
GFR calc Af Amer: 43 mL/min — ABNORMAL LOW (ref >60)
GFR calc non Af Amer: 38 mL/min — ABNORMAL LOW (ref >60)
Glucose, Bld: 193 mg/dL — ABNORMAL HIGH (ref 70–99)
Potassium: 5.3 mmol/L — ABNORMAL HIGH (ref 3.5–5.1)
Sodium: 135 mmol/L (ref 135–145)
Total Bilirubin: 1.1 mg/dL (ref 0.3–1.2)
Total Protein: 7.9 g/dL (ref 6.5–8.1)

## 2019-12-04 LAB — I-STAT ARTERIAL BLOOD GAS, ED
Acid-base deficit: 3 mmol/L — ABNORMAL HIGH (ref 0.0–2.0)
Bicarbonate: 19.6 mmol/L — ABNORMAL LOW (ref 20.0–28.0)
Calcium, Ion: 1.23 mmol/L (ref 1.15–1.40)
HCT: 25 % — ABNORMAL LOW (ref 39.0–52.0)
Hemoglobin: 8.5 g/dL — ABNORMAL LOW (ref 13.0–17.0)
O2 Saturation: 96 %
Patient temperature: 98.6
Potassium: 4.6 mmol/L (ref 3.5–5.1)
Sodium: 137 mmol/L (ref 135–145)
TCO2: 20 mmol/L — ABNORMAL LOW (ref 22–32)
pCO2 arterial: 26.7 mmHg — ABNORMAL LOW (ref 32.0–48.0)
pH, Arterial: 7.473 — ABNORMAL HIGH (ref 7.350–7.450)
pO2, Arterial: 74 mmHg — ABNORMAL LOW (ref 83.0–108.0)

## 2019-12-04 LAB — TROPONIN I (HIGH SENSITIVITY)
Troponin I (High Sensitivity): 6 ng/L (ref <18)
Troponin I (High Sensitivity): 5 ng/L (ref <18)

## 2019-12-04 LAB — LIPASE, BLOOD: Lipase: 51 U/L (ref 11–51)

## 2019-12-04 LAB — LACTIC ACID, PLASMA
Lactic Acid, Venous: 2.2 mmol/L (ref 0.5–1.9)
Lactic Acid, Venous: 3.6 mmol/L (ref 0.5–1.9)

## 2019-12-04 MED ORDER — PIPERACILLIN-TAZOBACTAM IN DEX 2-0.25 GM/50ML IV SOLN: 2.2500 g | Freq: Once | INTRAVENOUS | Status: DC

## 2019-12-04 MED ORDER — SODIUM CHLORIDE 0.9 % IV SOLN
2.0000 g | Freq: Once | INTRAVENOUS | Status: AC
  Administered 2019-12-04: 2 g via INTRAVENOUS
  Filled 2019-12-04: qty 2

## 2019-12-04 MED ORDER — SODIUM CHLORIDE 0.9 % IV BOLUS
1000.0000 mL | Freq: Once | INTRAVENOUS | Status: AC
  Administered 2019-12-04: 1000 mL via INTRAVENOUS

## 2019-12-04 MED ORDER — VANCOMYCIN HCL IN DEXTROSE 1-5 GM/200ML-% IV SOLN: 1000.0000 mg | Freq: Once | INTRAVENOUS | Status: DC

## 2019-12-04 MED ORDER — IOHEXOL 350 MG/ML SOLN
80.0000 mL | Freq: Once | INTRAVENOUS | Status: AC | PRN
  Administered 2019-12-04: 80 mL via INTRAVENOUS

## 2019-12-04 MED ORDER — VANCOMYCIN HCL 1500 MG/300ML IV SOLN
1500.0000 mg | Freq: Once | INTRAVENOUS | Status: AC
  Administered 2019-12-04: 1500 mg via INTRAVENOUS
  Filled 2019-12-04: qty 300

## 2019-12-04 MED ORDER — OXYCODONE-ACETAMINOPHEN 5-325 MG PO TABS
1.0000 | ORAL_TABLET | Freq: Four times a day (QID) | ORAL | Status: DC | PRN
  Administered 2019-12-05: 1 via ORAL
  Administered 2019-12-05: 2 via ORAL
  Administered 2019-12-05: 1 via ORAL
  Filled 2019-12-04: qty 1
  Filled 2019-12-04: qty 2
  Filled 2019-12-04: qty 1

## 2019-12-04 NOTE — ED Triage Notes (Signed)
Reported testing + for Covid on 7/25, and was hospital for a few days. Stated the last 4 days he's been having pain abdominal pain, shortness of breath and productive cough. Patient arrived w/ NRB mask at 10 L; EMS endorsed initial Sp02 was 70s on RA.

## 2019-12-04 NOTE — ED Notes (Signed)
Patient transported to CT 

## 2019-12-04 NOTE — ED Notes (Signed)
Pt remains on 02 at 4LPM via [facility]

## 2019-12-04 NOTE — ED Notes (Signed)
Patient transported to MRI 

## 2019-12-04 NOTE — ED Notes (Signed)
Pt given turkey sandwich and coke 

## 2019-12-04 NOTE — ED Provider Notes (Signed)
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 administration in time range)  oxyCODONE-acetaminophen (PERCOCET/ROXICET) 5-325 MG per tablet 1-2 tablet (1 tablet Oral Given 12/05/19 0005)  bicalutamide (CASODEX) tablet 50 mg (has no administration in time range)  fenofibrate tablet 160 mg (has no administration in time range)  irbesartan (AVAPRO) tablet 300 mg (300 mg Oral Given 12/05/19 1059)  lactated ringers infusion ( Intravenous New Bag/Given 12/05/19 0226)  pantoprazole (PROTONIX) EC tablet 40 mg (40 mg Oral Given 12/05/19 1059)  saccharomyces boulardii (FLORASTOR) capsule 250 mg (250 mg Oral Not Given 12/05/19 1105)  finasteride (PROSCAR) tablet 5 mg (has no administration in time range)  insulin aspart (novoLOG) injection 0-15 Units (3 Units Subcutaneous Given 12/05/19 0830)  enoxaparin (LOVENOX) injection 40 mg (40 mg Subcutaneous Given 12/05/19 1100)  albuterol (VENTOLIN HFA) 108 (90 Base) MCG/ACT inhaler 2 puff (2 puffs  Inhalation Given 12/05/19 1100)  polyethylene glycol (MIRALAX / GLYCOLAX) packet 17 g (has no administration in time range)  acetaminophen (TYLENOL) tablet 650 mg (has no administration in time range)  ondansetron (ZOFRAN) tablet 4 mg (has no administration in time range)    Or  ondansetron (ZOFRAN) injection 4 mg (has no administration in time range)  methylPREDNISolone sodium succinate (SOLU-MEDROL) 40 mg/mL injection 40 mg (40 mg  Intravenous Given 12/05/19 0220)  sodium chloride 0.9 % bolus 1,000 mL (0 mLs Intravenous Stopped 12/04/19 2126)  iohexol (OMNIPAQUE) 350 MG/ML injection 80 mL (80 mLs Intravenous Contrast Given 12/04/19 1919)  ceFEPIme (MAXIPIME) 2 g in sodium chloride 0.9 % 100 mL IVPB (0 g Intravenous Stopped 12/04/19 2158)  vancomycin (VANCOREADY) IVPB 1500 mg/300 mL (0 mg Intravenous Stopped 12/05/19 0005)  sodium zirconium cyclosilicate (LOKELMA) packet 10 g (10 g Oral Given 12/05/19 1100)    ED Course  I have reviewed the triage vital signs and the nursing notes.  Pertinent labs & imaging results that were available during my care of the patient were reviewed by me and considered in my medical decision making (see chart for details).    MDM Rules/Calculators/A&P                          79 year old male with history as above presents with abdominal pain and cough.  Patient states that he was diagnosed with Covid in late July and recently spent approximately 5 days in the hospital for Covid.  States that he has been home for approximately 10 days and approximately 4 to 5 days ago start developing worsening abdominal pain as well as cough.  States that he has had one episode nonbilious nonbloody vomiting however he has been having decreased appetite as well as a few episodes of dark stools.  Patient states he has been having worsening productive cough as well as fevers and chills at home but denies any chest pain, shortness of breath.  Afebrile.  Patient initially hypoxic with fire  and was placed on 10 L of oxygen.  Patient currently on 4 L of oxygen satting mid 90s.  Remainder vital signs stable.  Exam as above.  Exam most notable for generalized abdominal tenderness without rebound or guarding.  Upon patient's chart review he was recently discharged on 7/30 with a diagnosis of COVID-19 pneumonia as well as bacterial pneumonia with cavitary lung lesion.  Patient was also noted to have severe sepsis at that time due to E. coli which was thought to be due to choledocholithiasis.  Patient underwent a sphincterotomy as well as stone removal however he did not have a cholecystectomy during his hospital stay.  Given these the records it is possible the patient has worsening gallbladder disease or abscess and possibly worsening of his pneumonia.  We will obtain chest x-ray as well as CT PE to evaluate for PE and CT abdomen and pelvis.    CT chest redemonstrated multifocal pneumonia consistent with COVID-19 infection as well as cavitary lesion in the right lung.  CT abdomen pelvis unremarkable.  Given new oxygen requirement and acute hypoxic respiratory failure w/ previous episodes of bacterial pneumonia patient was started on vancomycin and cefepime and given fluids.  Remainder of labs okay.  Hospitalist service was consulted for admission and  agreed with admission to their service.  Patient was admitted to the hospitalist service in stable condition without further events.  Patient no further events during the duration of my shift.  Final Clinical Impression(s) / ED Diagnoses Final diagnoses:  [id number]    Rx / DC Orders ED Discharge Orders    None       Silvestre Gunner, MD 12/05/19 1119    Drenda Freeze, MD 12/08/19 1536

## 2019-12-04 NOTE — H&P (Signed)
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 obtain procalcitonin, CRP, blood cultures and sputum culture.  We will not continue antibiotic started by the emergency department at this time.  If procalcitonin is markedly elevated we will consider resumption of antibacterial therapy.  Continuing to provide supplemental oxygen to attempt to achieve oxygen saturation of 94%.  Patient completed a course of remdesivir during his recent hospitalization 2 weeks ago and therefore this will not be restarted.  We will go ahead and place patient back on steroids however.  Providing bronchodilator therapy via [facility] for shortness of breath and wheezing.  Patient is still within 21 days of diagnosis and therefore will remain in droplet and contact isolation.  Admitting patient to the Covid unit.  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  I am not convinced that this is bacterial since patient has completed a course of broad-spectrum antibiotic therapy.  We will consider inpatient consultation of pulmonology to identify the etiology of the cavitary lesion, I am concerned that malignancy may be present considering patient's longstanding smoking history.  Alternatively patient may follow-up closely with pulmonology in the outpatient setting, original plan was for patient to follow-up with them after the last discharge.    Abdominal pain   CT imaging of the abdomen and pelvis revealed no intra-abdominal pathology, no redemonstration of choledocholithiasis or hepatobiliary disease.  Hepatic function panel does not seem consistent with recurrent hepatobiliary disease either.  Abdominal pain may be secondary to musculoskeletal strain from shortness of breath.  Will monitor for symptomatic improvement as patient's respiratory status improves.    Pneumonia due to COVID-19 virus   Please see assessment and plan above.    Lactic acidosis   Substantial lactic acidosis   Hydrating patient with intravenous isotonic fluids, providing supplemental oxygen  Performing serial lactic acid levels to ensure downtrending and resolution.    Coronary artery disease involving native coronary artery of native heart without angina pectoris   Patient is currently chest pain-free, troponin unremarkable, EKG reveals no evidence of dynamic change.  Monitoring patient on telemetry.    Essential hypertension   Continue home regimen of antihypertensive  therapy.    Uncontrolled type 2 diabetes mellitus with hyperglycemia, without long-term current use of insulin ([place])   Accu-Cheks before every meal and nightly with sliding scale insulin  Holding home regimen of oral hypoglycemics.    Dehydration   Patient exhibits clinical evidence of dehydration in addition to markedly elevated BUN and slight elevation  of creatinine  Hydrate patient with intravenous isotonic fluids  Monitoring for clinical improvement    Hyperkalemia   Mild hyperkalemia without EKG changes likely secondary to diminished renal function  Monitoring patient on telemetry  Monitoring potassium levels with serial chemistries  Mild hyperkalemia will likely resolve with intravenous volume resuscitation.    Lumbar back pain   Patient complaining of severe low back pain with some tenderness on examination  Obtaining lumbar x-ray for now      Code Status:  DNR Family Communication: Deferred  Status is: Inpatient  Remains inpatient appropriate because:IV treatments appropriate due to intensity of illness or inability to take PO and Inpatient level of care appropriate due to severity of illness   Dispo: The patient is from: Home              Anticipated d/c is to: Home              Anticipated d/c date is: 3 days              Patient currently is not medically stable to d/c.        Vernelle Emerald MD [facility] Pager [phone]  If 7PM-7AM, please contact night-coverage [email or web address] Use universal [place] password for that web site. If you do not have the password, please call the hospital operator.  12/04/2019, 10:43 PM   '

## 2019-12-04 NOTE — Progress Notes (Signed)
ABG obtained on 4lpm

## 2019-12-05 DIAGNOSIS — E875 Hyperkalemia: Secondary | ICD-10-CM

## 2019-12-05 LAB — BLOOD CULTURE ID PANEL (REFLEXED) - BCID2

## 2019-12-05 LAB — CBC WITH DIFFERENTIAL/PLATELET
Abs Immature Granulocytes: 0.02 10*3/uL (ref 0.00–0.07)
Basophils Absolute: 0 10*3/uL (ref 0.0–0.1)
Basophils Relative: 0 %
Eosinophils Absolute: 0 10*3/uL (ref 0.0–0.5)
Eosinophils Relative: 1 %
HCT: 29.2 % — ABNORMAL LOW (ref 39.0–52.0)
Hemoglobin: 9 g/dL — ABNORMAL LOW (ref 13.0–17.0)
Immature Granulocytes: 0 %
Lymphocytes Relative: 11 %
Lymphs Abs: 0.5 10*3/uL — ABNORMAL LOW (ref 0.7–4.0)
MCH: 30.4 pg (ref 26.0–34.0)
MCHC: 30.8 g/dL (ref 30.0–36.0)
MCV: 98.6 fL (ref 80.0–100.0)
Monocytes Absolute: 0.2 10*3/uL (ref 0.1–1.0)
Monocytes Relative: 4 %
Neutro Abs: 4 10*3/uL (ref 1.7–7.7)
Neutrophils Relative %: 84 %
Platelets: 150 10*3/uL (ref 150–400)
RBC: 2.96 MIL/uL — ABNORMAL LOW (ref 4.22–5.81)
RDW: 14.8 % (ref 11.5–15.5)
WBC: 4.8 10*3/uL (ref 4.0–10.5)
nRBC: 0 % (ref 0.0–0.2)

## 2019-12-05 LAB — BRAIN NATRIURETIC PEPTIDE: B Natriuretic Peptide: 64.6 pg/mL (ref 0.0–100.0)

## 2019-12-05 LAB — URINALYSIS, ROUTINE W REFLEX MICROSCOPIC
Bilirubin Urine: NEGATIVE
Glucose, UA: 50 mg/dL — AB
Hgb urine dipstick: NEGATIVE
Ketones, ur: NEGATIVE mg/dL
Leukocytes,Ua: NEGATIVE
Nitrite: NEGATIVE
Protein, ur: NEGATIVE mg/dL
Specific Gravity, Urine: 1.038 — ABNORMAL HIGH (ref 1.005–1.030)
pH: 6 (ref 5.0–8.0)

## 2019-12-05 LAB — COMPREHENSIVE METABOLIC PANEL
ALT: 20 U/L (ref 0–44)
AST: 42 U/L — ABNORMAL HIGH (ref 15–41)
Albumin: 2.3 g/dL — ABNORMAL LOW (ref 3.5–5.0)
Alkaline Phosphatase: 43 U/L (ref 38–126)
Anion gap: 12 (ref 5–15)
BUN: 64 mg/dL — ABNORMAL HIGH (ref 8–23)
CO2: 21 mmol/L — ABNORMAL LOW (ref 22–32)
Calcium: 9 mg/dL (ref 8.9–10.3)
Chloride: 101 mmol/L (ref 98–111)
Creatinine, Ser: 1.48 mg/dL — ABNORMAL HIGH (ref 0.61–1.24)
GFR calc Af Amer: 53 mL/min — ABNORMAL LOW (ref >60)
GFR calc non Af Amer: 45 mL/min — ABNORMAL LOW (ref >60)
Glucose, Bld: 147 mg/dL — ABNORMAL HIGH (ref 70–99)
Potassium: 5.1 mmol/L (ref 3.5–5.1)
Sodium: 134 mmol/L — ABNORMAL LOW (ref 135–145)
Total Bilirubin: 0.6 mg/dL (ref 0.3–1.2)
Total Protein: 6.2 g/dL — ABNORMAL LOW (ref 6.5–8.1)

## 2019-12-05 LAB — CBG MONITORING, ED
Glucose-Capillary: 181 mg/dL — ABNORMAL HIGH (ref 70–99)
Glucose-Capillary: 242 mg/dL — ABNORMAL HIGH (ref 70–99)
Glucose-Capillary: 257 mg/dL — ABNORMAL HIGH (ref 70–99)
Glucose-Capillary: 219 mg/dL — ABNORMAL HIGH (ref 70–99)

## 2019-12-05 LAB — MAGNESIUM: Magnesium: 2.5 mg/dL — ABNORMAL HIGH (ref 1.7–2.4)

## 2019-12-05 LAB — ABO/RH: ABO/RH(D): A POS

## 2019-12-05 LAB — C-REACTIVE PROTEIN: CRP: 13.3 mg/dL — ABNORMAL HIGH (ref <1.0)

## 2019-12-05 LAB — D-DIMER, QUANTITATIVE: D-Dimer, Quant: 3.85 ug/mL-FEU — ABNORMAL HIGH (ref 0.00–0.50)

## 2019-12-05 LAB — LACTIC ACID, PLASMA: Lactic Acid, Venous: 1.7 mmol/L (ref 0.5–1.9)

## 2019-12-05 LAB — PROCALCITONIN: Procalcitonin: 0.14 ng/mL

## 2019-12-05 MED ORDER — COLCHICINE 0.6 MG PO TABS: 0.6000 mg | ORAL_TABLET | Freq: Every day | ORAL | Status: DC | PRN

## 2019-12-05 MED ORDER — ACETAMINOPHEN 325 MG PO TABS: 650.0000 mg | ORAL_TABLET | Freq: Four times a day (QID) | ORAL | Status: DC | PRN

## 2019-12-05 MED ORDER — ALBUTEROL SULFATE HFA 108 (90 BASE) MCG/ACT IN AERS
2.0000 | INHALATION_SPRAY | Freq: Four times a day (QID) | RESPIRATORY_TRACT | Status: DC
  Administered 2019-12-05 – 2019-12-09 (×18): 2 via RESPIRATORY_TRACT
  Filled 2019-12-05 (×3): qty 6.7

## 2019-12-05 MED ORDER — ONDANSETRON HCL 4 MG/2ML IJ SOLN: 4.0000 mg | Freq: Four times a day (QID) | INTRAMUSCULAR | Status: DC | PRN

## 2019-12-05 MED ORDER — PANTOPRAZOLE SODIUM 40 MG PO TBEC
40.0000 mg | DELAYED_RELEASE_TABLET | Freq: Every day | ORAL | Status: DC
  Administered 2019-12-05 – 2019-12-06 (×2): 40 mg via ORAL
  Filled 2019-12-05 (×2): qty 1

## 2019-12-05 MED ORDER — LACTATED RINGERS IV SOLN: INTRAVENOUS | Status: AC

## 2019-12-05 MED ORDER — INSULIN ASPART 100 UNIT/ML ~~LOC~~ SOLN
0.0000 [IU] | Freq: Three times a day (TID) | SUBCUTANEOUS | Status: DC
  Administered 2019-12-05: 5 [IU] via SUBCUTANEOUS
  Administered 2019-12-05: 8 [IU] via SUBCUTANEOUS
  Administered 2019-12-05: 5 [IU] via SUBCUTANEOUS
  Administered 2019-12-05: 3 [IU] via SUBCUTANEOUS
  Administered 2019-12-06: 8 [IU] via SUBCUTANEOUS
  Administered 2019-12-06 (×2): 3 [IU] via SUBCUTANEOUS
  Administered 2019-12-06: 5 [IU] via SUBCUTANEOUS
  Administered 2019-12-07: 3 [IU] via SUBCUTANEOUS
  Administered 2019-12-07: 8 [IU] via SUBCUTANEOUS
  Administered 2019-12-07: 11 [IU] via SUBCUTANEOUS
  Administered 2019-12-08: 3 [IU] via SUBCUTANEOUS
  Administered 2019-12-08: 5 [IU] via SUBCUTANEOUS
  Administered 2019-12-08: 11 [IU] via SUBCUTANEOUS
  Administered 2019-12-08 – 2019-12-09 (×2): 3 [IU] via SUBCUTANEOUS
  Administered 2019-12-09: 5 [IU] via SUBCUTANEOUS

## 2019-12-05 MED ORDER — ENOXAPARIN SODIUM 40 MG/0.4ML ~~LOC~~ SOLN
40.0000 mg | Freq: Every day | SUBCUTANEOUS | Status: DC
  Administered 2019-12-05 – 2019-12-06 (×2): 40 mg via SUBCUTANEOUS
  Filled 2019-12-05 (×2): qty 0.4

## 2019-12-05 MED ORDER — ASPIRIN EC 325 MG PO TBEC
325.0000 mg | DELAYED_RELEASE_TABLET | Freq: Every day | ORAL | Status: DC
  Administered 2019-12-05 – 2019-12-06 (×2): 325 mg via ORAL
  Filled 2019-12-05 (×2): qty 1

## 2019-12-05 MED ORDER — ALLOPURINOL 300 MG PO TABS
300.0000 mg | ORAL_TABLET | Freq: Every day | ORAL | Status: DC
  Administered 2019-12-05 – 2019-12-09 (×5): 300 mg via ORAL
  Filled 2019-12-05 (×2): qty 1
  Filled 2019-12-05: qty 3
  Filled 2019-12-05 (×3): qty 1

## 2019-12-05 MED ORDER — IRBESARTAN 300 MG PO TABS
300.0000 mg | ORAL_TABLET | Freq: Every day | ORAL | Status: DC
  Administered 2019-12-05: 300 mg via ORAL
  Filled 2019-12-05 (×3): qty 1

## 2019-12-05 MED ORDER — FENOFIBRATE 160 MG PO TABS
160.0000 mg | ORAL_TABLET | Freq: Every evening | ORAL | Status: DC
  Administered 2019-12-06 – 2019-12-08 (×4): 160 mg via ORAL
  Filled 2019-12-05 (×6): qty 1

## 2019-12-05 MED ORDER — SACCHAROMYCES BOULARDII 250 MG PO CAPS
250.0000 mg | ORAL_CAPSULE | Freq: Two times a day (BID) | ORAL | Status: DC
  Administered 2019-12-05 – 2019-12-09 (×8): 250 mg via ORAL
  Filled 2019-12-05 (×9): qty 1

## 2019-12-05 MED ORDER — POLYETHYLENE GLYCOL 3350 17 G PO PACK: 17.0000 g | PACK | Freq: Every day | ORAL | Status: DC | PRN

## 2019-12-05 MED ORDER — BICALUTAMIDE 50 MG PO TABS
50.0000 mg | ORAL_TABLET | Freq: Every day | ORAL | Status: DC
  Administered 2019-12-05 – 2019-12-08 (×4): 50 mg via ORAL
  Filled 2019-12-05 (×6): qty 1

## 2019-12-05 MED ORDER — FINASTERIDE 5 MG PO TABS
5.0000 mg | ORAL_TABLET | Freq: Every day | ORAL | Status: DC
  Administered 2019-12-05 – 2019-12-08 (×4): 5 mg via ORAL
  Filled 2019-12-05 (×4): qty 1

## 2019-12-05 MED ORDER — SODIUM ZIRCONIUM CYCLOSILICATE 10 G PO PACK
10.0000 g | PACK | Freq: Once | ORAL | Status: AC
  Administered 2019-12-05: 10 g via ORAL
  Filled 2019-12-05: qty 1

## 2019-12-05 MED ORDER — ONDANSETRON HCL 4 MG PO TABS: 4.0000 mg | ORAL_TABLET | Freq: Four times a day (QID) | ORAL | Status: DC | PRN

## 2019-12-05 MED ORDER — METHYLPREDNISOLONE SODIUM SUCC 40 MG IJ SOLR
40.0000 mg | Freq: Two times a day (BID) | INTRAMUSCULAR | Status: DC
  Administered 2019-12-05 – 2019-12-06 (×4): 40 mg via INTRAVENOUS
  Filled 2019-12-05 (×4): qty 1

## 2019-12-05 NOTE — Progress Notes (Signed)
PROGRESS NOTE                                                                                                                                                                                                             Patient Demographics:    Cody Moss, is a 79 y.o. male, DOB - 1941/01/16, HEN:[id number]  Admit date - 12/04/2019   Admitting Physician Vernelle Emerald, MD  Outpatient Primary MD for the patient is [name], MD  LOS - 1   Chief Complaint  Patient presents with  . Shortness of Breath       Brief Narrative   79 year old male with past medical history of gout, hypertension, diabetes mellitus type 2, abdominal aortic aneurysm status post repair, pulmonary fibrosis, coronary artery disease status post PCI/stent, gastroesophageal reflux disease, hyperlipidemia and recent diagnosis of COVID-19 on 7/25 who presents with complaints of cough, poor oral intake, generalized weakness and abdominal discomfort.  Of note, patient was recently admitted to [facility] from 7/25-7/30.  Patient was admitted for COVID-19 infection (Dx 7/25) but during the hospitalization was also found to have a cavitary lesion of the right lower lobe of the lungs.  Patient was also found to have E. coli bacteremia and concurrently found to have evidence of choledocholithiasis and suspected cholangitis.  Patient was treated with a course of intravenous Zosyn and azithromycin.  Patient underwent ERCP and sphincterotomy with stone extraction by Dr. Henrene Pastor.  Patient was eventually discharged on 7/30 on Augmentin and azithromycin with eventual plan for the patient to follow-up with Dr.  Melvyn Novas with pulmonology.    Since the patient's discharge on 7/25 the patient has continued to experience productive cough, severe in intensity with white to yellow sputum.  Patient initially denied associated shortness of breath but upon further questioning patient eventually  stated yes.  Patient is unable to provide further descriptors concerning this however.  Patient has also continued to experience generalized weakness and poor oral intake.  As patient clinically worsened over the next several weeks patient began to develop dull abdominal pain, radiating from the bilateral flanks to the mid abdomen.  This pain is severe in intensity, worse with deep inspiration and movement and improved with rest.  Of note, the patient states that he did complete the remainder of his course of azithromycin and Augmentin.  Because of patient's progressively worsening generalized weakness poor intake, abdominal pain cough and shortness of breath patient initially presented to [facility] emergency department for evaluation.  Upon evaluation in the emergency department CT imaging the chest revealed significant interval progression of the multifocal groundglass pulmonary infiltrates and peripheral areas of consolidation concerning for progressive atypical infection/likely COVID-19 pneumonia.  Clinically, patient was felt to be hypovolemic and also found to have a lactic acidosis initially of 2.2 which rose to 3.6 during his stay in the emergency department.  Patient was placed back on intravenous antibiotic therapy with cefepime and vancomycin.  Patient was provided with 1 L of isotonic IV fluids.  The hospitalist group was then called to assess the patient for admission to the hospital.    Subjective:    Cody Moss today does report some shortness of breath, denies any abdominal pain, nausea or vomiting .   Assessment  & Plan :    Principal Problem:   Acute respiratory failure with hypoxia (HCC) Active Problems:   Coronary artery disease involving native coronary artery of native heart without angina pectoris   Gout   Essential hypertension   Cavitating mass in right lower lung lobe   Abdominal pain   Pneumonia due to COVID-19 virus   Uncontrolled type 2 diabetes  mellitus with hyperglycemia, without long-term current use of insulin (HCC)   GERD without esophagitis   Mixed diabetic hyperlipidemia associated with type 2 diabetes mellitus (HCC)   Dehydration   Hyperkalemia   Lactic acidosis   Lumbar back pain  Acute respiratory failure with hypoxia -This is most likely in the setting of recent COVID-19 pneumonia, with possible residual pneumonia with inflammation. -For now we will continue with isolation, continue with IV steroids, no indication for IV remdesivir at this point. -Is low at 0.14, so we will hold on IV antibiotics. -Continue with bronchodilators   Cavitating mass in right lower lung lobe -Following with pulmonary Dr. Marlene Lard, I have discussed at length with the patient, he understands the importance of the follow-up as an outpatient, he will call and schedule an appointment, treated initially with antibiotics Moss elevated procalcitonin on previous admissions, procalcitonin has normalized, but no significant change in the mass site, which raises possibility of malignancy, likely he will need PET scan to be done in outpatient setting which I have discussed with him, as well discussed with him hilar lymphadenopathy as well.  Moderate left UPJ obstruction - discussed with urology Dr Alinda Money, he is following with Dr. Diona Fanti as outpatient for history of prostate cancer, Dr. Alinda Money reviewed imaging, actually no significant changes from recent scan 7/25, no significant hydronephrosis, mainly significant for chronic cyst.  At this point no recommendation for further work-up unless he has worsening renal function the setting of obstruction then he will need repeat imaging.  Pneumonia due to COVID-19 virus -Please see above discussion  Lactic acidosis -Continue with IV fluids.  Coronary artery disease involving native coronary artery of native heart without angina pectoris/history of PAD - Patient is currently chest pain-free, troponin  unremarkable, EKG reveals no evidence of dynamic change. - will check hemoccult Moss patient having dark color stool for 3 days, but there is no evidence of GI bleed, hemoglobin is stable.  Essential hypertension - Continue home regimen of antihypertensive therapy.  Uncontrolled type 2 diabetes mellitus with hyperglycemia, without long-term current use of insulin ([place]) - Accu-Cheks before every meal and nightly with sliding scale insulin - Holding home regimen of oral hypoglycemics.  Dehydration - continue with IV fluids  Hyperkalemia - improving, received  lokelma today, will recheck in am      COVID-19 Labs  Recent Labs    12/05/19 0602  DDIMER 3.85*  CRP 13.3*    Lab Results  Component Value Date   SARSCOV2NAA POSITIVE (A) 11/15/2019   SARSCOV2NAA Not Detected 07/17/2019   [place] NEGATIVE 05/01/2019   Carlos Not Detected 02/03/2019     Code Status : DNR  Family Communication  :   Disposition Plan  :  Status is: Inpatient  Remains inpatient appropriate because:Hemodynamically unstable and IV treatments appropriate due to intensity of illness or inability to take PO   Dispo: The patient is from: Home              Anticipated d/c is to: Home              Anticipated d/c date is: 3 days              Patient currently is not medically stable to d/c.       Consults  :  None  Procedures  : None  DVT Prophylaxis  :  [place] lovenox  Lab Results  Component Value Date   PLT 150 12/05/2019    Antibiotics  :    Anti-infectives (From admission, onward)   Start     Dose/Rate Route Frequency Ordered Stop   12/04/19 2100  ceFEPIme (MAXIPIME) 2 g in sodium chloride 0.9 % 100 mL IVPB        2 g 200 mL/hr over 30 Minutes Intravenous  Once 12/04/19 2050 12/04/19 2158   12/04/19 2100  vancomycin (VANCOREADY) IVPB 1500 mg/300 mL        1,500 mg 150 mL/hr over 120 Minutes Intravenous  Once 12/04/19 2053 12/05/19 0005   12/04/19 2045  vancomycin  (VANCOCIN) IVPB 1000 mg/200 mL premix  Status:  Discontinued        1,000 mg 200 mL/hr over 60 Minutes Intravenous  Once 12/04/19 2031 12/04/19 2053   12/04/19 2045  piperacillin-tazobactam (ZOSYN) IVPB 2.25 g  Status:  Discontinued        2.25 g 100 mL/hr over 30 Minutes Intravenous  Once 12/04/19 2031 12/04/19 2053        Objective:   Vitals:   12/05/19 1231 12/05/19 1301 12/05/19 1400 12/05/19 1500  BP: (!) 121/55 121/60 (!) 110/57 (!) 111/54  Pulse: 63 (!) 58 70 (!) 54  Resp: (!) 26 (!) 30 (!) 29 19  Temp:      TempSrc:      SpO2: 91% 100% 96% 97%  Weight:      Height:        Wt Readings from Last 3 Encounters:  12/04/19 78 kg  11/18/19 85.2 kg  08/12/19 87.5 kg     Intake/Output Summary (Last 24 hours) at 12/05/2019 1530 Last data filed at 12/05/2019 1259 Gross per 24 hour  Intake --  Output 475 ml  Net -475 ml     Physical Exam  Awake Alert, Oriented X 3, No new F.N deficits, Normal affect Symmetrical Chest wall movement, Good air movement bilaterally, CTAB RRR,No Gallops,Rubs or new Murmurs, No Parasternal Heave +ve B.Sounds, Abd Soft, No tenderness,  No rebound - guarding or rigidity. No Cyanosis, Clubbing or edema, No new Rash or bruise     Data Review:    CBC Recent Labs  Lab 12/04/19 1754 12/04/19 2311 12/05/19 0606  WBC 6.4  --  4.8  HGB 10.3* 8.5* 9.0*  HCT 32.8* 25.0* 29.2*  PLT 130*  --  150  MCV 96.5  --  98.6  MCH 30.3  --  30.4  MCHC 31.4  --  30.8  RDW 14.6  --  14.8  LYMPHSABS 0.7  --  0.5*  MONOABS 0.4  --  0.2  EOSABS 0.0  --  0.0  BASOSABS 0.0  --  0.0    Chemistries  Recent Labs  Lab 12/04/19 1754 12/04/19 2311 12/05/19 0606  NA 135 137 134*  K 5.3* 4.6 5.1  CL 99  --  101  CO2 19*  --  21*  GLUCOSE 193*  --  147*  BUN 76*  --  64*  CREATININE 1.73*  --  1.48*  CALCIUM 9.9  --  9.0  MG  --   --  2.5*  AST 35  --  42*  ALT 21  --  20  ALKPHOS 48  --  43  BILITOT 1.1  --  0.6    ------------------------------------------------------------------------------------------------------------------ No results for input(s): CHOL, HDL, LDLCALC, TRIG, CHOLHDL, LDLDIRECT in the last 72 hours.  Lab Results  Component Value Date   HGBA1C 7.5 (H) 11/15/2019   ------------------------------------------------------------------------------------------------------------------ No results for input(s): TSH, T4TOTAL, T3FREE, THYROIDAB in the last 72 hours.  Invalid input(s): FREET3 ------------------------------------------------------------------------------------------------------------------ No results for input(s): VITAMINB12, FOLATE, FERRITIN, TIBC, IRON, RETICCTPCT in the last 72 hours.  Coagulation profile No results for input(s): INR, PROTIME in the last 168 hours.  Recent Labs    12/05/19 0602  DDIMER 3.85*    Cardiac Enzymes No results for input(s): CKMB, TROPONINI, MYOGLOBIN in the last 168 hours.  Invalid input(s): CK ------------------------------------------------------------------------------------------------------------------    Component Value Date/Time   BNP 64.6 12/05/2019 0602    Inpatient Medications  Scheduled Meds: . albuterol  2 puff Inhalation Q6H  . allopurinol  300 mg Oral Daily  . aspirin  325 mg Oral Daily  . bicalutamide  50 mg Oral QHS  . enoxaparin (LOVENOX) injection  40 mg Subcutaneous Daily  . fenofibrate  160 mg Oral QPM  . finasteride  5 mg Oral QHS  . insulin aspart  0-15 Units Subcutaneous TID AC & HS  . irbesartan  300 mg Oral Daily  . methylPREDNISolone (SOLU-MEDROL) injection  40 mg Intravenous Q12H  . pantoprazole  40 mg Oral Daily  . saccharomyces boulardii  250 mg Oral BID   Continuous Infusions: . lactated ringers 125 mL/hr at 12/05/19 1125   PRN Meds:.acetaminophen, colchicine, ondansetron **OR** ondansetron (ZOFRAN) IV, oxyCODONE-acetaminophen, polyethylene glycol  Micro Results Recent Results (from the  past 240 hour(s))  Blood culture (routine x 2)     Status: None (Preliminary result)   Collection Time: 12/04/19  5:54 PM   Specimen: BLOOD  Result Value Ref Range Status   Specimen Description BLOOD RIGHT ANTECUBITAL  Final   Special Requests   Final    BOTTLES DRAWN AEROBIC AND ANAEROBIC Blood Culture adequate volume   Culture   Final    NO GROWTH < 24 HOURS Performed at [facility], [place] [address] [place], [place] [address]    Report Status PENDING  Incomplete  Blood culture (routine x 2)     Status: None (Preliminary result)   Collection Time: 12/04/19  6:05 PM   Specimen: BLOOD RIGHT HAND  Result Value Ref Range Status   Specimen Description BLOOD RIGHT HAND  Final   Special Requests   Final    BOTTLES  DRAWN AEROBIC AND ANAEROBIC Blood Culture adequate volume   Culture   Final    NO GROWTH < 24 HOURS Performed at [facility], [place] [address], [place], [place] [address]    Report Status PENDING  Incomplete    Radiology Reports DG Lumbar Spine 2-3 Views  Result Date: 12/04/2019 CLINICAL DATA:  Lumbosacral back pain. EXAM: LUMBAR SPINE - 2-3 VIEW COMPARISON:  Reformats from abdominal CT earlier today. FINDINGS: Normal alignment. Vertebral body heights are preserved. Significant disc space narrowing at L4-L5 and L5-S1. Facet hypertrophy at these levels. Multilevel endplate spurring of the upper lumbar spine with preservation of disc spaces. No fracture or focal lesion. Retained excreted IV contrast within dilated left renal collecting system as seen on CT earlier today. There is excreted IV contrast in the urinary bladder. IMPRESSION: 1. 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 No acute osseous abnormality. 2. Retained excreted IV contrast within dilated left renal collecting system as seen on CT earlier today. Electronically Signed   By: Keith Rake M.D.   On: 12/04/2019 23:48   CT Chest W Contrast  Result Date:  11/15/2019 CLINICAL DATA:  79 year old male with pneumonia. Concern for abscess or effusion. EXAM: CT CHEST, ABDOMEN, AND PELVIS WITH CONTRAST TECHNIQUE: Multidetector CT imaging of the chest, abdomen and pelvis was performed following the standard protocol during bolus administration of intravenous contrast. CONTRAST:  143mL OMNIPAQUE IOHEXOL 300 MG/ML  SOLN COMPARISON:  Chest CT dated 02/14/2018. Chest radiograph dated 11/15/2019. FINDINGS: CT CHEST FINDINGS Cardiovascular: There is no cardiomegaly or pericardial effusion. Advanced 3 vessel coronary vascular calcification. There is advanced calcified and noncalcified plaque of the thoracic aorta. No aneurysmal dilatation or dissection. The central pulmonary arteries appear patent. Mediastinum/Nodes: Right hilar adenopathy measuring 13 mm. Subcarinal lymph node measures 10 mm in short axis. The esophagus and the thyroid gland are grossly unremarkable. No mediastinal fluid collection. Lungs/Pleura: There is background of emphysema. Bibasilar subpleural reticulation as seen on the prior CT in keeping with interstitial lung disease. Overall slight progression of the fibrotic changes since the prior CT. There is a 2.6 x 2.7 cm cavitary nodule in the superior segment of the right lower lobe which may represent an abscess, an infected pneumatocele. Fungal infection, TB or cavitary neoplasm are not excluded. Clinical correlation and follow-up to resolution is recommended. Several scattered bilateral lower lobe nodular densities, present on the prior CT. No lobar consolidation, pleural effusion, or pneumothorax. The central airways are patent. Musculoskeletal: No chest wall mass or suspicious bone lesions identified. CT ABDOMEN PELVIS FINDINGS No intra-abdominal free air or free fluid. Hepatobiliary: There is fatty infiltration of the liver. Slight irregularity of the liver contour may represent early changes of cirrhosis. Elastography may provide better evaluation. There  is tumefactive sludge versus noncalcified stones within the gallbladder. There is diffuse gallbladder wall thickening and small pericholecystic fluid. Findings may be related to underlying liver disease although acute cholecystitis is not excluded. Further evaluation with right upper quadrant ultrasound recommended. Pancreas: Unremarkable. No pancreatic ductal dilatation or surrounding inflammatory changes. Spleen: Normal in size without focal abnormality. Adrenals/Urinary Tract: The adrenal glands are unremarkable. Multiple bilateral renal cysts with the largest a lobulated appearing left renal interpolar cyst measuring up to 13 cm in length. Several additional subcentimeter hypodensities are too small to characterize. There is no hydronephrosis on either side. There is symmetric enhancement and excretion of contrast by both kidneys. Mild bilateral perinephric stranding, nonspecific. Correlation with urinalysis recommended to  exclude UTI. The visualized ureters and urinary bladder appear unremarkable. Stomach/Bowel: There is severe sigmoid diverticulosis without active inflammatory changes. There is no bowel obstruction or active inflammation. The appendix is normal. Vascular/Lymphatic: There is advanced aortoiliac atherosclerotic disease. 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 The graft appears patent. There is advanced atherosclerotic calcification of the common femoral arteries bilaterally, right greater left. The IVC is unremarkable. No portal venous gas. There is no adenopathy. Reproductive: Prostatectomy. 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 Musculoskeletal: There is degenerative changes of the spine. No acute osseous pathology. IMPRESSION: 1. A 2.6 x 2.7 cm cavitary nodule in the superior segment of the right lower lobe may represent an abscess, an infected pneumatocele. Fungal infection, TB or cavitary neoplasm are not  excluded. Clinical correlation and follow-up to resolution is recommended. 2. Emphysema with slight progression of the interstitial lung disease since the prior CT. 3. Fatty liver with possible early changes of cirrhosis. Elastography may provide better evaluation. 4. Gallbladder tumefactive sludge versus noncalcified stones. Diffuse gallbladder wall thickening and small pericholecystic fluid may be related to underlying liver disease although acute cholecystitis is not excluded. Further evaluation with right upper quadrant ultrasound recommended. 5. Severe sigmoid diverticulosis. No bowel obstruction. Normal appendix. 6. Aortic Atherosclerosis (ICD10-I70.0) and Emphysema (ICD10-J43.9). Electronically Signed   By: Anner Crete M.D.   On: 11/15/2019 19:11   CT Angio Chest PE W and/or Wo Contrast  Result Date: 12/04/2019 CLINICAL DATA:  COVID pneumonia, dyspnea, productive cough, unspecified abdominal pain EXAM: CT ANGIOGRAPHY CHEST CT ABDOMEN AND PELVIS WITH CONTRAST TECHNIQUE: Multidetector CT imaging of the chest was performed using the standard protocol during bolus administration of intravenous contrast. Multiplanar CT image reconstructions and MIPs were obtained to evaluate the vascular anatomy. Multidetector CT imaging of the abdomen and pelvis was performed using the standard protocol during bolus administration of intravenous contrast. CONTRAST:  96mL OMNIPAQUE IOHEXOL 350 MG/ML SOLN COMPARISON:  11/15/2019 FINDINGS: CTA CHEST FINDINGS Cardiovascular: There is excellent opacification of the pulmonary arterial tree. There is no intraluminal filling defect to suggest acute pulmonary embolism. There is borderline enlargement of the central pulmonary arteries suggesting mild pulmonary arterial hypertension. There is extensive multi-vessel coronary artery calcification noted. Global cardiac size is within normal limits. Left ventricular hypertrophy is noted. There is mild calcification of the aortic valve  leaflets noted. The thoracic aorta demonstrates mild calcification within the aortic arch. Thoracic aorta is of normal caliber. Mediastinum/Nodes: Multiple pathologically enlarged right hilar lymph nodes are identifiedl measuring up to 19 mm in short axis diameter. Lungs/Pleura: Since the prior examination, multifocal ground-glass pulmonary infiltrates and peripheral areas of consolidation have significantly progressed in keeping with progressive atypical infection and compatible with progressive changes of COVID-19 pneumonia. There Isq, however, a focal cavitary nodule identified within the superior segment of the right lower lobe measuring 2.5 x 2.5 cm, similar to that noted on prior examination, with associated pathologic right hilar adenopathy. Differential considerations are unchanged, though malignancy is strongly considered Moss the associated pathologic right hilar adenopathy. No pneumothorax or pleural effusion. No central obstructing mass. Bronchial wall thickening is again identified in keeping with airway inflammation. Musculoskeletal: No chest wall abnormality. No acute or significant osseous findings. Review of the MIP images confirms the above findings. CT ABDOMEN and PELVIS FINDINGS Hepatobiliary: Layering high density material within the gallbladder lumen is in keeping with inspissated sludge or tiny gallstones. There is pneumobilia now identified  within the biliary tree suggesting changes of interval sphincterotomy. No intra or extrahepatic biliary ductal dilation. The liver is unremarkable. Pancreas: Unremarkable Spleen: Unremarkable Adrenals/Urinary Tract: The adrenal glands are unremarkable. The kidneys are normal in size and position. There is mild left hydronephrosis to the level of the ureteropelvic junction with decompressive the ureter thereafter in keeping with a moderate left UPJ obstruction. Superimposed parapelvic and cortical simple cysts are identified. There is symmetric enhancement  of the left kidney however. Multiple simple cortical cysts are seen within the right kidney. No hydronephrosis on the right. No renal or ureteral calculi. The bladder is unremarkable. Stomach/Bowel: Moderate sigmoid diverticulosis is identified without superimposed inflammatory change. Scattered diverticula are seen within the ascending colon. Stomach, small bowel, and large bowel are otherwise unremarkable. Appendix normal. No free intraperitoneal gas or fluid Vascular/Lymphatic: Moderate soft mural plaque is identified within the distal descending thoracic aorta at the diaphragmatic hiatus. 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 Extensive atherosclerotic calcification is seen within the terminal lower extremity arterial inflow and visualized outflow. There is no pathologic adenopathy within the abdomen and pelvis. Reproductive: Status post prostatectomy. Other: Rectum unremarkable. Musculoskeletal: No lytic or blastic bone lesions within the abdomen. Review of the MIP images confirms the above findings. IMPRESSION: 1. No evidence of acute pulmonary embolism. 2. Significant interval progression of multifocal ground-glass pulmonary infiltrates and peripheral areas of consolidation in keeping with progressive atypical infection and compatible with COVID-19 pneumonia. 3. Persistent cavitary nodule within the superior segment of the right lower lobe, with associated pathologic right hilar adenopathy. Differential considerations are unchanged, though malignancy is strongly considered Moss the associated pathologic right hilar adenopathy. Short-term follow-up imaging or PET-CT examination may be helpful once the patient's acute issues have resolved for further evaluation. 4. Interval probable sphincterotomy with pneumobilia. Cholelithiasis persists though superimposed pericholecystic inflammatory change has resolved. 5. Moderate left UPJ obstruction. 6. Diverticulosis without evidence of acute  diverticulitis. Electronically Signed   By: Fidela Salisbury MD   On: 12/04/2019 20:02   NM Hepatobiliary Liver Func  Result Date: 11/17/2019 CLINICAL DATA:  Nausea and vomiting. Cholelithiasis and choledocholithiasis. EXAM: NUCLEAR MEDICINE HEPATOBILIARY IMAGING TECHNIQUE: Sequential images of the abdomen were obtained out to 60 minutes following intravenous administration of radiopharmaceutical. RADIOPHARMACEUTICALS:  5.5 mCi Tc-76m  Choletec IV COMPARISON:  None. FINDINGS: Prompt uptake and biliary excretion of activity by the liver is seen. Gallbladder activity is visualized, consistent with patency of cystic duct. Biliary activity passes into small bowel, consistent with patent common bile duct. Reflux of biliary activity into the stomach is noted. IMPRESSION: Patency of both cystic and common bile ducts is demonstrated. Bile reflux noted. Electronically Signed   By: Marlaine Hind M.D.   On: 11/17/2019 15:15   CT ABDOMEN PELVIS W CONTRAST  Result Date: 12/04/2019 CLINICAL DATA:  COVID pneumonia, dyspnea, productive cough, unspecified abdominal pain EXAM: CT ANGIOGRAPHY CHEST CT ABDOMEN AND PELVIS WITH CONTRAST TECHNIQUE: Multidetector CT imaging of the chest was performed using the standard protocol during bolus administration of intravenous contrast. Multiplanar CT image reconstructions and MIPs were obtained to evaluate the vascular anatomy. Multidetector CT imaging of the abdomen and pelvis was performed using the standard protocol during bolus administration of intravenous contrast. CONTRAST:  50mL OMNIPAQUE IOHEXOL 350 MG/ML SOLN COMPARISON:  11/15/2019 FINDINGS: CTA CHEST FINDINGS Cardiovascular: There is excellent opacification of the pulmonary arterial tree. There is no intraluminal filling defect to suggest acute pulmonary embolism. There is borderline enlargement of the central pulmonary arteries suggesting mild  pulmonary arterial hypertension. There is extensive multi-vessel coronary artery  calcification noted. Global cardiac size is within normal limits. Left ventricular hypertrophy is noted. There is mild calcification of the aortic valve leaflets noted. The thoracic aorta demonstrates mild calcification within the aortic arch. Thoracic aorta is of normal caliber. Mediastinum/Nodes: Multiple pathologically enlarged right hilar lymph nodes are identifiedl measuring up to 19 mm in short axis diameter. Lungs/Pleura: Since the prior examination, multifocal ground-glass pulmonary infiltrates and peripheral areas of consolidation have significantly progressed in keeping with progressive atypical infection and compatible with progressive changes of COVID-19 pneumonia. There Isq, however, a focal cavitary nodule identified within the superior segment of the right lower lobe measuring 2.5 x 2.5 cm, similar to that noted on prior examination, with associated pathologic right hilar adenopathy. Differential considerations are unchanged, though malignancy is strongly considered Moss the associated pathologic right hilar adenopathy. No pneumothorax or pleural effusion. No central obstructing mass. Bronchial wall thickening is again identified in keeping with airway inflammation. Musculoskeletal: No chest wall abnormality. No acute or significant osseous findings. Review of the MIP images confirms the above findings. CT ABDOMEN and PELVIS FINDINGS Hepatobiliary: Layering high density material within the gallbladder lumen is in keeping with inspissated sludge or tiny gallstones. There is pneumobilia now identified within the biliary tree suggesting changes of interval sphincterotomy. No intra or extrahepatic biliary ductal dilation. The liver is unremarkable. Pancreas: Unremarkable Spleen: Unremarkable Adrenals/Urinary Tract: The adrenal glands are unremarkable. The kidneys are normal in size and position. There is mild left hydronephrosis to the level of the ureteropelvic junction with decompressive the ureter  thereafter in keeping with a moderate left UPJ obstruction. Superimposed parapelvic and cortical simple cysts are identified. There is symmetric enhancement of the left kidney however. Multiple simple cortical cysts are seen within the right kidney. No hydronephrosis on the right. No renal or ureteral calculi. The bladder is unremarkable. Stomach/Bowel: Moderate sigmoid diverticulosis is identified without superimposed inflammatory change. Scattered diverticula are seen within the ascending colon. Stomach, small bowel, and large bowel are otherwise unremarkable. Appendix normal. No free intraperitoneal gas or fluid Vascular/Lymphatic: Moderate soft mural plaque is identified within the distal descending thoracic aorta at the diaphragmatic hiatus. 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 Extensive atherosclerotic calcification is seen within the terminal lower extremity arterial inflow and visualized outflow. There is no pathologic adenopathy within the abdomen and pelvis. Reproductive: Status post prostatectomy. Other: Rectum unremarkable. Musculoskeletal: No lytic or blastic bone lesions within the abdomen. Review of the MIP images confirms the above findings. IMPRESSION: 1. No evidence of acute pulmonary embolism. 2. Significant interval progression of multifocal ground-glass pulmonary infiltrates and peripheral areas of consolidation in keeping with progressive atypical infection and compatible with COVID-19 pneumonia. 3. Persistent cavitary nodule within the superior segment of the right lower lobe, with associated pathologic right hilar adenopathy. Differential considerations are unchanged, though malignancy is strongly considered Moss the associated pathologic right hilar adenopathy. Short-term follow-up imaging or PET-CT examination may be helpful once the patient's acute issues have resolved for further evaluation. 4. Interval probable sphincterotomy with pneumobilia. Cholelithiasis persists  though superimposed pericholecystic inflammatory change has resolved. 5. Moderate left UPJ obstruction. 6. Diverticulosis without evidence of acute diverticulitis. Electronically Signed   By: Fidela Salisbury MD   On: 12/04/2019 20:02   CT Abdomen Pelvis W Contrast  Result Date: 11/15/2019 CLINICAL DATA:  79 year old male with pneumonia. Concern for abscess or effusion. EXAM: CT CHEST, ABDOMEN, AND PELVIS WITH CONTRAST TECHNIQUE: Multidetector CT imaging of  the chest, abdomen and pelvis was performed following the standard protocol during bolus administration of intravenous contrast. CONTRAST:  174mL OMNIPAQUE IOHEXOL 300 MG/ML  SOLN COMPARISON:  Chest CT dated 02/14/2018. Chest radiograph dated 11/15/2019. FINDINGS: CT CHEST FINDINGS Cardiovascular: There is no cardiomegaly or pericardial effusion. Advanced 3 vessel coronary vascular calcification. There is advanced calcified and noncalcified plaque of the thoracic aorta. No aneurysmal dilatation or dissection. The central pulmonary arteries appear patent. Mediastinum/Nodes: Right hilar adenopathy measuring 13 mm. Subcarinal lymph node measures 10 mm in short axis. The esophagus and the thyroid gland are grossly unremarkable. No mediastinal fluid collection. Lungs/Pleura: There is background of emphysema. Bibasilar subpleural reticulation as seen on the prior CT in keeping with interstitial lung disease. Overall slight progression of the fibrotic changes since the prior CT. There is a 2.6 x 2.7 cm cavitary nodule in the superior segment of the right lower lobe which may represent an abscess, an infected pneumatocele. Fungal infection, TB or cavitary neoplasm are not excluded. Clinical correlation and follow-up to resolution is recommended. Several scattered bilateral lower lobe nodular densities, present on the prior CT. No lobar consolidation, pleural effusion, or pneumothorax. The central airways are patent. Musculoskeletal: No chest wall mass or suspicious  bone lesions identified. CT ABDOMEN PELVIS FINDINGS No intra-abdominal free air or free fluid. Hepatobiliary: There is fatty infiltration of the liver. Slight irregularity of the liver contour may represent early changes of cirrhosis. Elastography may provide better evaluation. There is tumefactive sludge versus noncalcified stones within the gallbladder. There is diffuse gallbladder wall thickening and small pericholecystic fluid. Findings may be related to underlying liver disease although acute cholecystitis is not excluded. Further evaluation with right upper quadrant ultrasound recommended. Pancreas: Unremarkable. No pancreatic ductal dilatation or surrounding inflammatory changes. Spleen: Normal in size without focal abnormality. Adrenals/Urinary Tract: The adrenal glands are unremarkable. Multiple bilateral renal cysts with the largest a lobulated appearing left renal interpolar cyst measuring up to 13 cm in length. Several additional subcentimeter hypodensities are too small to characterize. There is no hydronephrosis on either side. There is symmetric enhancement and excretion of contrast by both kidneys. Mild bilateral perinephric stranding, nonspecific. Correlation with urinalysis recommended to exclude UTI. The visualized ureters and urinary bladder appear unremarkable. Stomach/Bowel: There is severe sigmoid diverticulosis without active inflammatory changes. There is no bowel obstruction or active inflammation. The appendix is normal. Vascular/Lymphatic: There is advanced aortoiliac atherosclerotic disease. 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 The graft appears patent. There is advanced atherosclerotic calcification of the common femoral arteries bilaterally, right greater left. The IVC is unremarkable. No portal venous gas. There is no adenopathy. Reproductive: Prostatectomy. 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  Musculoskeletal: There is degenerative changes of the spine. No acute osseous pathology. IMPRESSION: 1. A 2.6 x 2.7 cm cavitary nodule in the superior segment of the right lower lobe may represent an abscess, an infected pneumatocele. Fungal infection, TB or cavitary neoplasm are not excluded. Clinical correlation and follow-up to resolution is recommended. 2. Emphysema with slight progression of the interstitial lung disease since the prior CT. 3. Fatty liver with possible early changes of cirrhosis. Elastography may provide better evaluation. 4. Gallbladder tumefactive sludge versus noncalcified stones. Diffuse gallbladder wall thickening and small pericholecystic fluid may be related to underlying liver disease although acute cholecystitis is not excluded. Further evaluation with right upper quadrant ultrasound recommended. 5. Severe sigmoid diverticulosis. No bowel obstruction. Normal  appendix. 6. Aortic Atherosclerosis (ICD10-I70.0) and Emphysema (ICD10-J43.9). Electronically Signed   By: Anner Crete M.D.   On: 11/15/2019 19:11   US RENAL  Result Date: 11/18/2019 CLINICAL DATA:  Acute renal injury, COVID-19 positivity EXAM: RENAL / URINARY TRACT ULTRASOUND COMPLETE COMPARISON:  11/15/2019 FINDINGS: Right Kidney: Renal measurements: 12.3 x 5.1 x 4.3 cm. = volume: 140 mL. Less than 10 cysts are noted. The largest of these measures 2.7 cm in the midportion of the right kidney. Left Kidney: Renal measurements: 14.3 x 5.7 x 5.5 cm. = volume: 233 mL. Less than 10 cysts are noted. The largest of these measures 7.3 cm in greatest dimension. These are stable from the prior CT examination Bladder: Appears normal for degree of bladder distention. Other: Gallbladder is partially distended with echogenic material within similar to that seen on prior exam consistent with gallbladder sludge and stones. IMPRESSION: Gallbladder sludge and stones. Bilateral renal cysts which are simple in nature. No obstructive changes  are noted. Electronically Signed   By: Inez Catalina M.D.   On: 11/18/2019 03:03   MR ABDOMEN MRCP WO CONTRAST  Result Date: 11/17/2019 CLINICAL DATA:  Cholelithiasis EXAM: MRI ABDOMEN WITHOUT CONTRAST  (INCLUDING MRCP) TECHNIQUE: Multiplanar multisequence MR imaging of the abdomen was performed. Heavily T2-weighted images of the biliary and pancreatic ducts were obtained, and three-dimensional MRCP images were rendered by post processing. COMPARISON:  CT chest, abdomen and pelvis of November 15, 2019 FINDINGS: Lower chest: Small effusions at the lung bases. As basilar airspace disease and parenchymal findings demonstrated on the recent chest CT are not well evaluated on the MRI. Hepatobiliary: Mild hepatic steatosis. No focal, suspicious hepatic lesion, assessment limited by the noncontrast imaging. Pericholecystic stranding and signs of wall thickening. Mild biliary duct distension with numerous filling defects in the common bile duct compatible with multiple small stones. Largest approximately 5 mm. At least 4-5 additional small biliary calculi in the common bile duct. Pancreas:  No ductal dilation.  No peripancreatic inflammation. Spleen: Skin normal in size and contour without focal, suspicious lesion. Adrenals/Urinary Tract:  Adrenal glands are normal spleen. Bilateral renal cysts. Asymmetric albeit slightly Peri nephric stranding greater on the LEFT than the RIGHT. Dilated collecting systems in the upper pole and in the lower pole LEFT kidney perhaps due to mass-effect upon collecting systems in the setting of large renal sinus cysts with similar appearance to prior imaging studies. Stomach/Bowel: Stomach gastrointestinal tract with limited assessment, unremarkable to the extent evaluated. 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 Other:  None. Musculoskeletal: No suspicious bone lesions identified. IMPRESSION: 1.  Choledocholithiasis with mild biliary duct dilation as described. 2. Pericholecystic stranding and signs of wall thickening, remaining suspicious for acute cholecystitis. If there are discordant clinical findings, HIDA scan may be helpful for further assessment. 3. Renal sinus cysts and stable collecting system dilation on the LEFT with renal cysts on the RIGHT and mild perinephric stranding, nonspecific and similar to previous imaging studies. 4. Small effusions at the lung bases. 5. Mild hepatic steatosis. Electronically Signed   By: Zetta Bills M.D.   On: 11/17/2019 08:06   DG Chest Portable 1 View  Result Date: 12/04/2019 CLINICAL DATA:  Shortness of breath EXAM: PORTABLE CHEST 1 VIEW COMPARISON:  11/16/2019 FINDINGS: Patchy peripheral airspace opacities throughout the right lung and in the left lower lobe concerning for pneumonia. Heart is normal size. No effusions or pneumothorax. No acute bony abnormality. IMPRESSION: Patchy peripheral  airspace opacities, right greater than left concerning for pneumonia, possibly COVID pneumonia. Electronically Signed   By: Rolm Baptise M.D.   On: 12/04/2019 18:30   DG Chest Port 1 View  Result Date: 11/16/2019 CLINICAL DATA:  Shortness of breath and fever EXAM: PORTABLE CHEST 1 VIEW COMPARISON:  Chest radiograph and chest CT November 15, 2019 FINDINGS: There is a degree of underlying fibrosis. The cavitary lesion in the right lower lobe seen 1 day prior is less well seen by radiography although is evident, measuring 2.5 x 2.4 cm. There is a degree of lower lobe bronchiectatic change. No new opacity evident. Heart size and pulmonary vascularity are normal. Lymph node prominence seen on CT is not appreciable by radiography. There is aortic atherosclerosis. No bone lesions appreciable. IMPRESSION: Cavitary lesion right lower lobe, much better seen on recent CT. This nodular lesion measures 2.5 x 2.4 cm on portable radiographic examination. There is an underlying degree  of fibrosis in the bases. No new opacity evident. Cardiac silhouette stable. Aortic Atherosclerosis (ICD10-I70.0). Electronically Signed   By: Lowella Grip III M.D.   On: 11/16/2019 09:34   DG Chest Port 1 View  Result Date: 11/15/2019 CLINICAL DATA:  Evaluate for infection. EXAM: PORTABLE CHEST 1 VIEW COMPARISON:  07/27/2015 FINDINGS: Lungs are adequately inflated without effusion or pneumothorax. Mild increased density over the right hilum/perihilar region. Cardiomediastinal silhouette and remainder the exam is unchanged. IMPRESSION: Minimal increased density over the right hilar/perihilar region. Medial airspace process is possible. Consider PA and lateral chest radiograph for better evaluation. Electronically Signed   By: Marin Olp M.D.   On: 11/15/2019 17:04   DG ERCP BILIARY & PANCREATIC DUCTS  Result Date: 11/19/2019 CLINICAL DATA:  ERCP for bile duct stones. EXAM: ERCP TECHNIQUE: Multiple spot images obtained with the fluoroscopic device and submitted for interpretation post-procedure. COMPARISON:  MRCP-11/17/2019; nuclear medicine HIDA scan-11/17/2019 FINDINGS: Eight spot intraoperative fluoroscopic images of the right upper abdominal quadrant during ERCP are provided for review Initial image demonstrates an ERCP probe overlying the right upper abdominal quadrant Subsequent images demonstrate selective cannulation opacification of the common bile duct. There are several filling defects within the CBD, potentially representative of air bubbles versus choledocholithiasis. Subsequent images demonstrate insufflation of a balloon within the central aspect of the CBD with subsequent biliary sweeping and presumed sphincterotomy. There is minimal opacification the cystic duct with passage of contrast to the level of the gallbladder lumen. There is minimal opacification intrahepatic biliary tree which appears nondilated. IMPRESSION: ERCP with biliary sweeping and presumed sphincterotomy as above.  These images were submitted for radiologic interpretation only. Please see the procedural report for the amount of contrast and the fluoroscopy time utilized. Electronically Signed   By: Sandi Mariscal M.D.   On: 11/19/2019 07:40   VAS [place] LOWER EXTREMITY VENOUS (DVT)  Result Date: 11/21/2019  Lower Venous DVTStudy Indications: Elevated d-dimer.  Comparison Study: 07-27-2015 LEV study Performing Technologist: Darlin Coco  Examination Guidelines: A complete evaluation includes B-mode imaging, spectral Doppler, color Doppler, and power Doppler as needed of all accessible portions of each vessel. Bilateral testing is considered an integral part of a complete examination. Limited examinations for reoccurring indications may be performed as noted. The reflux portion of the exam is performed with the patient in reverse Trendelenburg.  +---------+---------------+---------+-----------+----------+--------------+ RIGHT    CompressibilityPhasicitySpontaneityPropertiesThrombus Aging +---------+---------------+---------+-----------+----------+--------------+ CFV      Full           Yes      Yes                                 +---------+---------------+---------+-----------+----------+--------------+  SFJ      Full           Yes      Yes                                 +---------+---------------+---------+-----------+----------+--------------+ FV Prox  Full                                                        +---------+---------------+---------+-----------+----------+--------------+ FV Mid   Full                                                        +---------+---------------+---------+-----------+----------+--------------+ FV DistalFull                                                        +---------+---------------+---------+-----------+----------+--------------+ PFV      Full                                                         +---------+---------------+---------+-----------+----------+--------------+ POP      Full           Yes      Yes                                 +---------+---------------+---------+-----------+----------+--------------+ PTV      Full                                                        +---------+---------------+---------+-----------+----------+--------------+ PERO     Full                                                        +---------+---------------+---------+-----------+----------+--------------+   +---------+---------------+---------+-----------+----------+--------------+ LEFT     CompressibilityPhasicitySpontaneityPropertiesThrombus Aging +---------+---------------+---------+-----------+----------+--------------+ CFV      Full           Yes      Yes                                 +---------+---------------+---------+-----------+----------+--------------+ SFJ      Full           Yes      Yes                                 +---------+---------------+---------+-----------+----------+--------------+  FV Prox  Full                                                        +---------+---------------+---------+-----------+----------+--------------+ FV Mid   Full                                                        +---------+---------------+---------+-----------+----------+--------------+ FV DistalFull                                                        +---------+---------------+---------+-----------+----------+--------------+ PFV      Full                                                        +---------+---------------+---------+-----------+----------+--------------+ POP      Full           Yes      Yes                                 +---------+---------------+---------+-----------+----------+--------------+ PTV      Full                                                         +---------+---------------+---------+-----------+----------+--------------+ PERO     Full                                                        +---------+---------------+---------+-----------+----------+--------------+     Summary: RIGHT: - There is no evidence of deep vein thrombosis in the lower extremity.  - No cystic structure found in the popliteal fossa.  LEFT: - There is no evidence of deep vein thrombosis in the lower extremity.  - No cystic structure found in the popliteal fossa.  *See table(s) above for measurements and observations. Electronically signed by [name] MD on 11/21/2019 at 4:19:57 AM.    Final    ECHOCARDIOGRAM LIMITED  Result Date: 11/16/2019    ECHOCARDIOGRAM LIMITED REPORT   Patient Name:   OTHNIEL MARET Date of Exam: 11/16/2019 Medical Rec #:  [id number]         Height:       70.0 in Accession #:    [id number]        Weight:       187.8 lb Date of Birth:  04/29/1940         BSA:          2.032 m Patient  Age:    12 years          BP:           103/63 mmHg Patient Gender: M                 HR:           58 bpm. Exam Location:  Inpatient Procedure: Limited Echo, Color Doppler and Cardiac Doppler Indications:    CAD Native Vessel i25.10  History:        Patient has prior history of Echocardiogram examinations, most                 recent 07/29/2015. CAD; Risk Factors:Hypertension, Diabetes and                 Dyslipidemia. COVID+ at time of study.  Sonographer:    Raquel Sarna Senior RDCS Referring Phys: [place]  1. Left ventricular ejection fraction, by estimation, is 55 to 60%. The left ventricle has normal function. The left ventricle has no regional wall motion abnormalities. Left ventricular diastolic function could not be evaluated.  2. Right ventricular systolic function is normal. The right ventricular size is normal. There is mildly elevated pulmonary artery systolic pressure. The estimated right ventricular systolic pressure is 26.3 mmHg.   3. The mitral valve is grossly normal. Mild mitral valve regurgitation. No evidence of mitral stenosis.  4. The aortic valve is tricuspid. Aortic valve regurgitation is trivial. Mild aortic valve stenosis. Aortic valve mean gradient measures 9.9 mmHg. Aortic valve Vmax measures 2.15 m/s.  5. The inferior vena cava is dilated in size with <50% respiratory variability, suggesting right atrial pressure of 15 mmHg. Comparison(s): No significant change from prior study. FINDINGS  Left Ventricle: Left ventricular ejection fraction, by estimation, is 55 to 60%. The left ventricle has normal function. The left ventricle has no regional wall motion abnormalities. The left ventricular internal cavity size was normal in size. There is  no left ventricular hypertrophy. Right Ventricle: The right ventricular size is normal. No increase in right ventricular wall thickness. Right ventricular systolic function is normal. There is mildly elevated pulmonary artery systolic pressure. The tricuspid regurgitant velocity is 2.63  m/s, and with an assumed right atrial pressure of 15 mmHg, the estimated right ventricular systolic pressure is 33.5 mmHg. Left Atrium: Left atrial size was normal in size. Right Atrium: Right atrial size was normal in size. Pericardium: Trivial pericardial effusion is present. Presence of pericardial fat pad. Mitral Valve: The mitral valve is grossly normal. Mild mitral valve regurgitation. No evidence of mitral valve stenosis. Tricuspid Valve: The tricuspid valve is grossly normal. Tricuspid valve regurgitation is trivial. No evidence of tricuspid stenosis. Aortic Valve: The aortic valve is tricuspid. Aortic valve regurgitation is trivial. Mild aortic stenosis is present. Aortic valve mean gradient measures 9.9 mmHg. Aortic valve peak gradient measures 18.4 mmHg. Aortic valve area, by VTI measures 1.26 cm. Pulmonic Valve: The pulmonic valve was grossly normal. Pulmonic valve regurgitation is trivial. No  evidence of pulmonic stenosis. Aorta: The aortic root is normal in size and structure. Venous: The inferior vena cava is dilated in size with less than 50% respiratory variability, suggesting right atrial pressure of 15 mmHg. IAS/Shunts: The atrial septum is grossly normal. LEFT VENTRICLE PLAX 2D LVOT diam:     2.10 cm LV SV:         64 LV SV Index:   31 LVOT Area:     3.46 cm  LV Volumes (MOD) LV vol d, MOD A4C: 145.0 ml LV vol s, MOD A4C: 67.7 ml LV SV MOD A4C:     145.0 ml RIGHT VENTRICLE RV S prime:     9.14 cm/s TAPSE (M-mode): 2.3 cm AORTIC VALVE AV Area (Vmax):    1.11 cm AV Area (Vmean):   1.14 cm AV Area (VTI):     1.26 cm AV Vmax:           214.63 cm/s AV Vmean:          148.039 cm/s AV VTI:            0.506 m AV Peak Grad:      18.4 mmHg AV Mean Grad:      9.9 mmHg LVOT Vmax:         68.50 cm/s LVOT Vmean:        48.900 cm/s LVOT VTI:          0.184 m LVOT/AV VTI ratio: 0.36 TRICUSPID VALVE TR Peak grad:   27.7 mmHg TR Vmax:        263.00 cm/s  SHUNTS Systemic VTI:  0.18 m Systemic Diam: 2.10 cm Eleonore Chiquito MD Electronically signed by Eleonore Chiquito MD Signature Date/Time: 11/16/2019/12:16:24 PM    Final    US Abdomen Limited RUQ  Result Date: 11/15/2019 CLINICAL DATA:  79 year old male with nausea vomiting. EXAM: ULTRASOUND ABDOMEN LIMITED RIGHT UPPER QUADRANT COMPARISON:  CT abdomen pelvis dated 11/15/2019. FINDINGS: Gallbladder: There is sludge and stone within the gallbladder. There is diffuse gallbladder wall thickening and edema and a small pericholecystic fluid. Evaluation for sonographic Murphy's sign was limited as the patient was pre-medicated. Common bile duct: Diameter: 7 mm Liver: There is diffuse increased liver echogenicity most commonly seen in the setting of fatty infiltration. Superimposed inflammation or fibrosis is not excluded. Clinical correlation is recommended. Portal vein is patent on color Doppler imaging with normal direction of blood flow towards the liver. Other: None.  IMPRESSION: 1. Gallbladder sludge and stones with diffuse edema as seen on the earlier CT. A hepatobiliary scintigraphy may provide better evaluation of the gallbladder if there is a high clinical concern for acute cholecystitis . 2. Fatty liver. Electronically Signed   By: Anner Crete M.D.   On: 11/15/2019 21:02      Phillips Climes M.D on 12/05/2019 at 3:30 PM    [facility] -  Office  [phone]

## 2019-12-05 NOTE — ED Notes (Signed)
Cody Moss [id number] Would like an update

## 2019-12-05 NOTE — Progress Notes (Signed)
.   PHARMACY - PHYSICIAN COMMUNICATION CRITICAL VALUE ALERT - BLOOD CULTURE IDENTIFICATION (BCID)  Cody Moss is an 79 y.o. male who presented to [facility] on 12/04/2019 with a chief complaint of SOB  Assessment:  Patient dx with COVID no fever or elevated WBC at this time. Patient had blood cultures drawn on admission to the ED. Grew MRSE (methicillin resistant staph epi) in 1/4 bottles.   Name of physician (or Provider) Contacted: Dr. Cyd Silence  Current antibiotics: None  Changes to prescribed antibiotics recommended:  Patient recieved 1 time doses of abx in ED. 1/4 likely containmented culture MD and I agreeded on continuing to hold abx at this time .   Results for orders placed or performed during the hospital encounter of 12/04/19  Blood Culture ID Panel (Reflexed) (Collected: 12/04/2019  5:54 PM)  Result Value Ref Range   Enterococcus faecalis NOT DETECTED NOT DETECTED   Enterococcus Faecium NOT DETECTED NOT DETECTED   Listeria monocytogenes NOT DETECTED NOT DETECTED   Staphylococcus species DETECTED (A) NOT DETECTED   Staphylococcus aureus (BCID) NOT DETECTED NOT DETECTED   Staphylococcus epidermidis DETECTED (A) NOT DETECTED   Staphylococcus lugdunensis NOT DETECTED NOT DETECTED   Streptococcus species NOT DETECTED NOT DETECTED   Streptococcus agalactiae NOT DETECTED NOT DETECTED   Streptococcus pneumoniae NOT DETECTED NOT DETECTED   Streptococcus pyogenes NOT DETECTED NOT DETECTED   A.calcoaceticus-baumannii NOT DETECTED NOT DETECTED   Bacteroides fragilis NOT DETECTED NOT DETECTED   Enterobacterales NOT DETECTED NOT DETECTED   Enterobacter cloacae complex NOT DETECTED NOT DETECTED   Escherichia coli NOT DETECTED NOT DETECTED   Klebsiella aerogenes NOT DETECTED NOT DETECTED   Klebsiella oxytoca NOT DETECTED NOT DETECTED   Klebsiella pneumoniae NOT DETECTED NOT DETECTED   Proteus species NOT DETECTED NOT DETECTED   Salmonella species NOT DETECTED NOT DETECTED    Serratia marcescens NOT DETECTED NOT DETECTED   Haemophilus influenzae NOT DETECTED NOT DETECTED   Neisseria meningitidis NOT DETECTED NOT DETECTED   Pseudomonas aeruginosa NOT DETECTED NOT DETECTED   Stenotrophomonas maltophilia NOT DETECTED NOT DETECTED   Candida albicans NOT DETECTED NOT DETECTED   Candida auris NOT DETECTED NOT DETECTED   Candida glabrata NOT DETECTED NOT DETECTED   Candida krusei NOT DETECTED NOT DETECTED   Candida parapsilosis NOT DETECTED NOT DETECTED   Candida tropicalis NOT DETECTED NOT DETECTED   Cryptococcus neoformans/gattii NOT DETECTED NOT DETECTED   Methicillin resistance mecA/C DETECTED (A) NOT DETECTED    Duanne Limerick PharmD. BCPS  12/05/2019  9:03 PM

## 2019-12-05 NOTE — ED Notes (Signed)
Daughter, Lenna Sciara given update via phonecall

## 2019-12-06 DIAGNOSIS — R195 Other fecal abnormalities: Secondary | ICD-10-CM

## 2019-12-06 DIAGNOSIS — D62 Acute posthemorrhagic anemia: Secondary | ICD-10-CM

## 2019-12-06 DIAGNOSIS — K922 Gastrointestinal hemorrhage, unspecified: Secondary | ICD-10-CM

## 2019-12-06 DIAGNOSIS — K91841 Postprocedural hemorrhage and hematoma of a digestive system organ or structure following other procedure: Secondary | ICD-10-CM

## 2019-12-06 DIAGNOSIS — Z9889 Other specified postprocedural states: Secondary | ICD-10-CM

## 2019-12-06 LAB — CBC WITH DIFFERENTIAL/PLATELET
Abs Immature Granulocytes: 0.03 10*3/uL (ref 0.00–0.07)
Basophils Absolute: 0 10*3/uL (ref 0.0–0.1)
Basophils Relative: 0 %
Eosinophils Absolute: 0 10*3/uL (ref 0.0–0.5)
Eosinophils Relative: 0 %
HCT: 27.5 % — ABNORMAL LOW (ref 39.0–52.0)
Hemoglobin: 8.6 g/dL — ABNORMAL LOW (ref 13.0–17.0)
Immature Granulocytes: 1 %
Lymphocytes Relative: 11 %
Lymphs Abs: 0.5 10*3/uL — ABNORMAL LOW (ref 0.7–4.0)
MCH: 30.5 pg (ref 26.0–34.0)
MCHC: 31.3 g/dL (ref 30.0–36.0)
MCV: 97.5 fL (ref 80.0–100.0)
Monocytes Absolute: 0.3 10*3/uL (ref 0.1–1.0)
Monocytes Relative: 6 %
Neutro Abs: 4.1 10*3/uL (ref 1.7–7.7)
Neutrophils Relative %: 82 %
Platelets: 224 10*3/uL (ref 150–400)
RBC: 2.82 MIL/uL — ABNORMAL LOW (ref 4.22–5.81)
RDW: 14.6 % (ref 11.5–15.5)
WBC: 4.9 10*3/uL (ref 4.0–10.5)
nRBC: 0 % (ref 0.0–0.2)

## 2019-12-06 LAB — COMPREHENSIVE METABOLIC PANEL
ALT: 18 U/L (ref 0–44)
AST: 23 U/L (ref 15–41)
Albumin: 2.3 g/dL — ABNORMAL LOW (ref 3.5–5.0)
Alkaline Phosphatase: 39 U/L (ref 38–126)
Anion gap: 10 (ref 5–15)
BUN: 50 mg/dL — ABNORMAL HIGH (ref 8–23)
CO2: 22 mmol/L (ref 22–32)
Calcium: 9.4 mg/dL (ref 8.9–10.3)
Chloride: 102 mmol/L (ref 98–111)
Creatinine, Ser: 1.3 mg/dL — ABNORMAL HIGH (ref 0.61–1.24)
GFR calc Af Amer: >60 mL/min (ref >60)
GFR calc non Af Amer: 53 mL/min — ABNORMAL LOW (ref >60)
Glucose, Bld: 128 mg/dL — ABNORMAL HIGH (ref 70–99)
Potassium: 5.5 mmol/L — ABNORMAL HIGH (ref 3.5–5.1)
Sodium: 134 mmol/L — ABNORMAL LOW (ref 135–145)
Total Bilirubin: 0.4 mg/dL (ref 0.3–1.2)
Total Protein: 6.2 g/dL — ABNORMAL LOW (ref 6.5–8.1)

## 2019-12-06 LAB — CBC
HCT: 28.6 % — ABNORMAL LOW (ref 39.0–52.0)
Hemoglobin: 9.1 g/dL — ABNORMAL LOW (ref 13.0–17.0)
MCH: 30.5 pg (ref 26.0–34.0)
MCHC: 31.8 g/dL (ref 30.0–36.0)
MCV: 96 fL (ref 80.0–100.0)
Platelets: 158 10*3/uL (ref 150–400)
RBC: 2.98 MIL/uL — ABNORMAL LOW (ref 4.22–5.81)
RDW: 14.5 % (ref 11.5–15.5)
WBC: 6.1 10*3/uL (ref 4.0–10.5)
nRBC: 0 % (ref 0.0–0.2)

## 2019-12-06 LAB — CBG MONITORING, ED
Glucose-Capillary: 116 mg/dL — ABNORMAL HIGH (ref 70–99)
Glucose-Capillary: 183 mg/dL — ABNORMAL HIGH (ref 70–99)
Glucose-Capillary: 255 mg/dL — ABNORMAL HIGH (ref 70–99)
Glucose-Capillary: 159 mg/dL — ABNORMAL HIGH (ref 70–99)

## 2019-12-06 LAB — POTASSIUM: Potassium: 4.2 mmol/L (ref 3.5–5.1)

## 2019-12-06 LAB — TYPE AND SCREEN
ABO/RH(D): A POS
Antibody Screen: NEGATIVE

## 2019-12-06 LAB — MAGNESIUM: Magnesium: 2.1 mg/dL (ref 1.7–2.4)

## 2019-12-06 LAB — GLUCOSE, CAPILLARY: Glucose-Capillary: 242 mg/dL — ABNORMAL HIGH (ref 70–99)

## 2019-12-06 LAB — C-REACTIVE PROTEIN: CRP: 7.5 mg/dL — ABNORMAL HIGH (ref <1.0)

## 2019-12-06 LAB — POC OCCULT BLOOD, ED: Fecal Occult Bld: POSITIVE — AB

## 2019-12-06 LAB — D-DIMER, QUANTITATIVE: D-Dimer, Quant: 3.87 ug/mL-FEU — ABNORMAL HIGH (ref 0.00–0.50)

## 2019-12-06 MED ORDER — SODIUM CHLORIDE 0.9 % IV SOLN
8.0000 mg/h | INTRAVENOUS | Status: DC
  Administered 2019-12-06 – 2019-12-08 (×4): 8 mg/h via INTRAVENOUS
  Filled 2019-12-06 (×8): qty 80

## 2019-12-06 MED ORDER — METHYLPREDNISOLONE SODIUM SUCC 40 MG IJ SOLR
40.0000 mg | Freq: Three times a day (TID) | INTRAMUSCULAR | Status: DC
  Administered 2019-12-06 – 2019-12-08 (×5): 40 mg via INTRAVENOUS
  Filled 2019-12-06 (×5): qty 1

## 2019-12-06 MED ORDER — PANTOPRAZOLE SODIUM 40 MG IV SOLR: 40.0000 mg | Freq: Two times a day (BID) | INTRAVENOUS | Status: DC

## 2019-12-06 MED ORDER — SODIUM POLYSTYRENE SULFONATE 15 GM/60ML PO SUSP
45.0000 g | Freq: Once | ORAL | Status: AC
  Administered 2019-12-06: 45 g via ORAL
  Filled 2019-12-06: qty 180

## 2019-12-06 MED ORDER — ENSURE ENLIVE PO LIQD
237.0000 mL | Freq: Three times a day (TID) | ORAL | Status: DC
  Administered 2019-12-07 – 2019-12-08 (×3): 237 mL via ORAL
  Filled 2019-12-06 (×2): qty 237

## 2019-12-06 MED ORDER — HYDRALAZINE HCL 20 MG/ML IJ SOLN: 5.0000 mg | Freq: Four times a day (QID) | INTRAMUSCULAR | Status: DC | PRN

## 2019-12-06 MED ORDER — OXYCODONE-ACETAMINOPHEN 5-325 MG PO TABS: 1.0000 | ORAL_TABLET | Freq: Four times a day (QID) | ORAL | Status: DC | PRN

## 2019-12-06 MED ORDER — SODIUM CHLORIDE 0.9 % IV SOLN: INTRAVENOUS | Status: DC

## 2019-12-06 MED ORDER — PNEUMOCOCCAL VAC POLYVALENT 25 MCG/0.5ML IJ INJ
0.5000 mL | INJECTION | INTRAMUSCULAR | Status: AC
  Administered 2019-12-08: 0.5 mL via INTRAMUSCULAR
  Filled 2019-12-06: qty 0.5

## 2019-12-06 MED ORDER — SODIUM CHLORIDE 0.9 % IV SOLN
80.0000 mg | Freq: Once | INTRAVENOUS | Status: AC
  Administered 2019-12-06: 80 mg via INTRAVENOUS
  Filled 2019-12-06: qty 80

## 2019-12-06 NOTE — H&P (View-Only) (Signed)
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 performed using the standard protocol during bolus administration of intravenous contrast. CONTRAST:  38m OMNIPAQUE IOHEXOL 350 MG/ML SOLN COMPARISON:  11/15/2019 FINDINGS: CTA CHEST FINDINGS Cardiovascular: There is excellent opacification of the pulmonary arterial tree. There is no intraluminal filling defect to suggest acute pulmonary embolism. There is borderline enlargement of the central pulmonary arteries suggesting mild pulmonary arterial hypertension. There is extensive multi-vessel coronary artery calcification noted. Global cardiac size is within normal limits. Left ventricular hypertrophy is noted. There is mild calcification of the aortic valve leaflets noted. The thoracic aorta demonstrates mild calcification within the aortic arch. Thoracic aorta is of normal caliber. Mediastinum/Nodes: Multiple pathologically enlarged right hilar lymph nodes are identifiedl measuring up to 19 mm in short axis diameter. Lungs/Pleura: Since the prior examination, multifocal ground-glass pulmonary infiltrates and peripheral areas of consolidation have  significantly progressed in keeping with progressive atypical infection and compatible with progressive changes of COVID-19 pneumonia. There Isq, however, a focal cavitary nodule identified within the superior segment of the right lower lobe measuring 2.5 x 2.5 cm, similar to that noted on prior examination, with associated pathologic right hilar adenopathy. Differential considerations are unchanged, though malignancy is strongly considered given the associated pathologic right hilar adenopathy. No pneumothorax or pleural effusion. No central obstructing mass. Bronchial wall thickening is again identified in keeping with airway inflammation. Musculoskeletal: No chest wall abnormality. No acute or significant osseous findings. Review of the MIP images confirms the above findings. CT ABDOMEN and PELVIS FINDINGS Hepatobiliary: Layering high density material within the gallbladder lumen is in keeping with inspissated sludge or tiny gallstones. There is pneumobilia now identified within the biliary tree suggesting changes of interval sphincterotomy. No intra or extrahepatic biliary ductal dilation. The liver is unremarkable. Pancreas: Unremarkable Spleen: Unremarkable Adrenals/Urinary Tract: The adrenal glands are unremarkable. The kidneys are normal in size and position. There is mild left hydronephrosis to the level of the ureteropelvic junction with decompressive the ureter thereafter in keeping with a moderate left UPJ obstruction. Superimposed parapelvic and cortical simple cysts are identified. There is symmetric enhancement of the left kidney however. Multiple simple cortical cysts are seen within the right kidney. No hydronephrosis on the right. No renal or ureteral calculi. The bladder is unremarkable. Stomach/Bowel: Moderate sigmoid diverticulosis is identified without superimposed inflammatory change. Scattered diverticula are seen within the  ascending colon. Stomach, small bowel, and large bowel are otherwise  unremarkable. Appendix normal. No free intraperitoneal gas or fluid Vascular/Lymphatic: Moderate soft mural plaque is identified within the distal descending thoracic aorta at the diaphragmatic hiatus. 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 Extensive atherosclerotic calcification is seen within the terminal lower extremity arterial inflow and visualized outflow. There is no pathologic adenopathy within the abdomen and pelvis. Reproductive: Status post prostatectomy. Other: Rectum unremarkable. Musculoskeletal: No lytic or blastic bone lesions within the abdomen. Review of the MIP images confirms the above findings. IMPRESSION: 1. No evidence of acute pulmonary embolism. 2. Significant interval progression of multifocal ground-glass pulmonary infiltrates and peripheral areas of consolidation in keeping with progressive atypical infection and compatible with COVID-19 pneumonia. 3. Persistent cavitary nodule within the superior segment of the right lower lobe, with associated pathologic right hilar adenopathy. Differential considerations are unchanged, though malignancy is strongly considered given the associated pathologic right hilar adenopathy. Short-term follow-up imaging or PET-CT examination may be helpful once the patient's acute issues have resolved for further evaluation. 4. Interval probable sphincterotomy with pneumobilia. Cholelithiasis persists though superimposed pericholecystic inflammatory change has resolved. 5. Moderate left UPJ obstruction. 6. Diverticulosis without evidence of acute diverticulitis. Electronically Signed   By: Fidela Salisbury MD   On: 12/04/2019 20:02   DG Chest Portable 1 View  Result Date: 12/04/2019 CLINICAL DATA:  Shortness of breath EXAM: PORTABLE CHEST 1 VIEW COMPARISON:  11/16/2019 FINDINGS: Patchy peripheral airspace opacities throughout the right lung and in the left lower lobe concerning for pneumonia. Heart is normal size. No effusions or pneumothorax. No  acute bony abnormality. IMPRESSION: Patchy peripheral airspace opacities, right greater than left concerning for pneumonia, possibly COVID pneumonia. Electronically Signed   By: Rolm Baptise M.D.   On: 12/04/2019 18:30    Principal Problem:   Acute respiratory failure with hypoxia (HCC) Active Problems:   Coronary artery disease involving native coronary artery of native heart without angina pectoris   Gout   Essential hypertension   Cavitating mass in right lower lung lobe   Abdominal pain   Pneumonia due to COVID-19 virus   Uncontrolled type 2 diabetes mellitus with hyperglycemia, without long-term current use of insulin ([place])   GERD without esophagitis   Mixed diabetic hyperlipidemia associated with type 2 diabetes mellitus ([place])   Dehydration   Hyperkalemia   Lactic acidosis   Lumbar back pain    Tye Savoy, NP-C @  12/06/2019, 4:08 PM

## 2019-12-06 NOTE — ED Notes (Signed)
Call daughter Mackey Varricchio at [phone] with an update she is requesting the dr. To give the family a call. The family has not spoke with a  dr. At all the entire time patient has been in hospital.

## 2019-12-06 NOTE — ED Notes (Signed)
Cody Moss [phone] updated on plan of care; requesting admitting MD call this morning

## 2019-12-06 NOTE — Consult Note (Signed)
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 using the standard protocol during bolus administration of intravenous contrast. CONTRAST:  38m OMNIPAQUE IOHEXOL 350 MG/ML SOLN COMPARISON:  11/15/2019 FINDINGS: CTA CHEST FINDINGS Cardiovascular: There is excellent opacification of the pulmonary arterial tree. There is no intraluminal filling defect to suggest acute pulmonary embolism. There is borderline enlargement of the central pulmonary arteries suggesting mild pulmonary arterial hypertension. There is extensive multi-vessel coronary artery calcification noted. Global cardiac size is within normal limits. Left ventricular hypertrophy is noted. There is mild calcification of the aortic valve leaflets noted. The thoracic aorta demonstrates mild calcification within the aortic arch. Thoracic aorta is of normal caliber. Mediastinum/Nodes: Multiple pathologically enlarged right hilar lymph nodes are identifiedl measuring up to 19 mm in short axis diameter. Lungs/Pleura: Since the prior examination, multifocal ground-glass pulmonary infiltrates and peripheral areas of consolidation have  significantly progressed in keeping with progressive atypical infection and compatible with progressive changes of COVID-19 pneumonia. There Isq, however, a focal cavitary nodule identified within the superior segment of the right lower lobe measuring 2.5 x 2.5 cm, similar to that noted on prior examination, with associated pathologic right hilar adenopathy. Differential considerations are unchanged, though malignancy is strongly considered given the associated pathologic right hilar adenopathy. No pneumothorax or pleural effusion. No central obstructing mass. Bronchial wall thickening is again identified in keeping with airway inflammation. Musculoskeletal: No chest wall abnormality. No acute or significant osseous findings. Review of the MIP images confirms the above findings. CT ABDOMEN and PELVIS FINDINGS Hepatobiliary: Layering high density material within the gallbladder lumen is in keeping with inspissated sludge or tiny gallstones. There is pneumobilia now identified within the biliary tree suggesting changes of interval sphincterotomy. No intra or extrahepatic biliary ductal dilation. The liver is unremarkable. Pancreas: Unremarkable Spleen: Unremarkable Adrenals/Urinary Tract: The adrenal glands are unremarkable. The kidneys are normal in size and position. There is mild left hydronephrosis to the level of the ureteropelvic junction with decompressive the ureter thereafter in keeping with a moderate left UPJ obstruction. Superimposed parapelvic and cortical simple cysts are identified. There is symmetric enhancement of the left kidney however. Multiple simple cortical cysts are seen within the right kidney. No hydronephrosis on the right. No renal or ureteral calculi. The bladder is unremarkable. Stomach/Bowel: Moderate sigmoid diverticulosis is identified without superimposed inflammatory change. Scattered diverticula are seen within the  ascending colon. Stomach, small bowel, and large bowel are otherwise  unremarkable. Appendix normal. No free intraperitoneal gas or fluid Vascular/Lymphatic: Moderate soft mural plaque is identified within the distal descending thoracic aorta at the diaphragmatic hiatus. 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 Extensive atherosclerotic calcification is seen within the terminal lower extremity arterial inflow and visualized outflow. There is no pathologic adenopathy within the abdomen and pelvis. Reproductive: Status post prostatectomy. Other: Rectum unremarkable. Musculoskeletal: No lytic or blastic bone lesions within the abdomen. Review of the MIP images confirms the above findings. IMPRESSION: 1. No evidence of acute pulmonary embolism. 2. Significant interval progression of multifocal ground-glass pulmonary infiltrates and peripheral areas of consolidation in keeping with progressive atypical infection and compatible with COVID-19 pneumonia. 3. Persistent cavitary nodule within the superior segment of the right lower lobe, with associated pathologic right hilar adenopathy. Differential considerations are unchanged, though malignancy is strongly considered given the associated pathologic right hilar adenopathy. Short-term follow-up imaging or PET-CT examination may be helpful once the patient's acute issues have resolved for further evaluation. 4. Interval probable sphincterotomy with pneumobilia. Cholelithiasis persists though superimposed pericholecystic inflammatory change has resolved. 5. Moderate left UPJ obstruction. 6. Diverticulosis without evidence of acute diverticulitis. Electronically Signed   By: Fidela Salisbury MD   On: 12/04/2019 20:02   DG Chest Portable 1 View  Result Date: 12/04/2019 CLINICAL DATA:  Shortness of breath EXAM: PORTABLE CHEST 1 VIEW COMPARISON:  11/16/2019 FINDINGS: Patchy peripheral airspace opacities throughout the right lung and in the left lower lobe concerning for pneumonia. Heart is normal size. No effusions or pneumothorax. No  acute bony abnormality. IMPRESSION: Patchy peripheral airspace opacities, right greater than left concerning for pneumonia, possibly COVID pneumonia. Electronically Signed   By: Rolm Baptise M.D.   On: 12/04/2019 18:30    Principal Problem:   Acute respiratory failure with hypoxia (HCC) Active Problems:   Coronary artery disease involving native coronary artery of native heart without angina pectoris   Gout   Essential hypertension   Cavitating mass in right lower lung lobe   Abdominal pain   Pneumonia due to COVID-19 virus   Uncontrolled type 2 diabetes mellitus with hyperglycemia, without long-term current use of insulin ([place])   GERD without esophagitis   Mixed diabetic hyperlipidemia associated with type 2 diabetes mellitus ([place])   Dehydration   Hyperkalemia   Lactic acidosis   Lumbar back pain    Tye Savoy, NP-C @  12/06/2019, 4:08 PM

## 2019-12-06 NOTE — Progress Notes (Signed)
PROGRESS NOTE                                                                                                                                                                                                             Patient Demographics:    Cody Moss, is a 79 y.o. male, DOB - [birth year], TOI:[id number]  Admit date - 12/04/2019   Admitting Physician Vernelle Emerald, MD  Outpatient Primary MD for the patient is [name], MD  LOS - 2   Chief Complaint  Patient presents with  . Shortness of Breath       Brief Narrative   79 year old male with past medical history of gout, hypertension, diabetes mellitus type 2, abdominal aortic aneurysm status post repair, pulmonary fibrosis, coronary artery disease status post PCI/stent, gastroesophageal reflux disease, hyperlipidemia and recent diagnosis of COVID-19 on 7/25 who presents with complaints of cough, poor oral intake, generalized weakness and abdominal discomfort.  Of note, patient was recently admitted to [facility] from 7/25-7/30.  Patient was admitted for COVID-19 infection (Dx 7/25) but during the hospitalization was also found to have a cavitary lesion of the right lower lobe of the lungs.  Patient was also found to have E. coli bacteremia and concurrently found to have evidence of choledocholithiasis and suspected cholangitis.  Patient was treated with a course of intravenous Zosyn and azithromycin.  Patient underwent ERCP and sphincterotomy with stone extraction by Dr. Henrene Pastor.  Patient was eventually discharged on 7/30 on Augmentin and azithromycin with eventual plan for the patient to follow-up with Dr.  Melvyn Novas with pulmonology.    Since the patient's discharge on 7/25 the patient has continued to experience productive cough, severe in intensity with white to yellow sputum.  Patient initially denied associated shortness of breath but upon further questioning patient eventually  stated yes.  Patient is unable to provide further descriptors concerning this however.  Patient has also continued to experience generalized weakness and poor oral intake.  As patient clinically worsened over the next several weeks patient began to develop dull abdominal pain, radiating from the bilateral flanks to the mid abdomen.  This pain is severe in intensity, worse with deep inspiration and movement and improved with rest.  Of note, the patient states that he did complete the remainder of his course of azithromycin and Augmentin.  Because of patient's progressively worsening generalized weakness poor intake, abdominal pain cough and shortness of breath patient initially presented to [facility] emergency department for evaluation.  Upon evaluation in the emergency department CT imaging the chest revealed significant interval progression of the multifocal groundglass pulmonary infiltrates and peripheral areas of consolidation concerning for progressive atypical infection/likely COVID-19 pneumonia.  Clinically, patient was felt to be hypovolemic and also found to have a lactic acidosis initially of 2.2 which rose to 3.6 during his stay in the emergency department.  Patient was placed back on intravenous antibiotic therapy with cefepime and vancomycin.  Patient was provided with 1 L of isotonic IV fluids.  The hospitalist group was then called to assess the patient for admission to the hospital.    Subjective:    [name] today does report some shortness of breath, denies any abdominal pain, nausea or vomiting .  Did have a dark bowel movement this morning.   Assessment  & Plan :    Principal Problem:   Acute respiratory failure with hypoxia (HCC) Active Problems:   Coronary artery disease involving native coronary artery of native heart without angina pectoris   Gout   Essential hypertension   Cavitating mass in right lower lung lobe   Abdominal pain   Pneumonia due to  COVID-19 virus   Uncontrolled type 2 diabetes mellitus with hyperglycemia, without long-term current use of insulin (HCC)   GERD without esophagitis   Mixed diabetic hyperlipidemia associated with type 2 diabetes mellitus (HCC)   Dehydration   Hyperkalemia   Lactic acidosis   Lumbar back pain  Acute respiratory failure with hypoxia -This is most likely in the setting of recent COVID-19 pneumonia, with possible residual pneumonia with inflammation. -For now we will continue with isolation, continue with IV steroids, no indication for IV remdesivir at this point. -Procalcitonin is low at 0.14, so we will hold on IV antibiotics. -Continue with bronchodilators   Cavitating mass in right lower lung lobe -Following with pulmonary Dr. Marlene Lard, I have discussed at length with the patient, he understands the importance of the follow-up as an outpatient, he will call and schedule an appointment, treated initially with antibiotics given elevated procalcitonin on previous admissions, procalcitonin has normalized, but no significant change in the mass site, which raises possibility of malignancy, likely he will need PET scan to be done in outpatient setting which I have discussed with him, as well discussed with him hilar lymphadenopathy as well.  Moderate left UPJ obstruction - discussed with urology Dr Alinda Money, he is following with Dr. Diona Fanti as outpatient for history of prostate cancer, Dr. Alinda Money reviewed imaging, actually no significant changes from recent scan 7/25, no significant hydronephrosis, mainly significant for chronic cyst.  At this point no recommendation for further work-up unless he has worsening renal function the setting of obstruction then he will need repeat imaging.  Pneumonia due to COVID-19 virus -Please see above discussion  Elevated D-dimers -CTA chest with no evidence of PE, I will go ahead and check venous Dopplers.   subcu Lovenox has been held in the setting of GI  bleed.  GI bleed -patient presents with senna, he is on full dose aspirin, hemoglobin slightly on the lower side, he is Hemoccult positive, so far no indication for transfusion, will keep active type and screen, discussed with GI, will start on Protonix drip for now, and will await further commendations from GI.    Coronary artery disease involving native coronary artery of native  heart without angina pectoris/history of PAD - Patient is currently chest pain-free, troponin unremarkable, EKG reveals no evidence of dynamic change. -holding  aspirin in the setting of possible  GI bleed  Essential hypertension - Continue home regimen of antihypertensive therapy.  I have stopped Avapro given hyperkalemia, will add as needed hydralazine, but overall blood pressure is controlled  Uncontrolled type 2 diabetes mellitus with hyperglycemia, without long-term current use of insulin (HCC) - Accu-Cheks before every meal and nightly with sliding scale insulin - Holding home regimen of oral hypoglycemics.  Dehydration - continue with IV fluids  Hyperkalemia -Is 5.5 today, he is given Kayexalate, holding Avapro.      COVID-19 Labs  Recent Labs    12/05/19 0602 12/06/19 0247  DDIMER 3.85* 3.87*  CRP 13.3* 7.5*    Lab Results  Component Value Date   SARSCOV2NAA POSITIVE (A) 11/15/2019   SARSCOV2NAA Not Detected 07/17/2019   [place] NEGATIVE 05/01/2019   [place] Not Detected 02/03/2019     Code Status : DNR  Family Communication  : D/W daughter by phone  Disposition Plan  :  Status is: Inpatient  Remains inpatient appropriate because:Hemodynamically unstable and IV treatments appropriate due to intensity of illness or inability to take PO   Dispo: The patient is from: Home              Anticipated d/c is to: Home              Anticipated d/c date is: 3 days              Patient currently is not medically stable to d/c.       Consults  :  None  Procedures  :  None  DVT Prophylaxis  :  [place] lovenox  Lab Results  Component Value Date   PLT 224 12/06/2019    Antibiotics  :    Anti-infectives (From admission, onward)   Start     Dose/Rate Route Frequency Ordered Stop   12/04/19 2100  ceFEPIme (MAXIPIME) 2 g in sodium chloride 0.9 % 100 mL IVPB        2 g 200 mL/hr over 30 Minutes Intravenous  Once 12/04/19 2050 12/04/19 2158   12/04/19 2100  vancomycin (VANCOREADY) IVPB 1500 mg/300 mL        1,500 mg 150 mL/hr over 120 Minutes Intravenous  Once 12/04/19 2053 12/05/19 0005   12/04/19 2045  vancomycin (VANCOCIN) IVPB 1000 mg/200 mL premix  Status:  Discontinued        1,000 mg 200 mL/hr over 60 Minutes Intravenous  Once 12/04/19 2031 12/04/19 2053   12/04/19 2045  piperacillin-tazobactam (ZOSYN) IVPB 2.25 g  Status:  Discontinued        2.25 g 100 mL/hr over 30 Minutes Intravenous  Once 12/04/19 2031 12/04/19 2053        Objective:   Vitals:   12/06/19 0905 12/06/19 1030 12/06/19 1200 12/06/19 1230  BP: (!) 117/56 116/64 (!) 133/36 124/75  Pulse: 61 68 (!) 52 (!) 56  Resp: (!) 27 (!) 24 17 (!) 27  Temp:      TempSrc:      SpO2: 92% 97% 100% 94%  Weight:      Height:        Wt Readings from Last 3 Encounters:  12/04/19 78 kg  11/18/19 85.2 kg  08/12/19 87.5 kg     Intake/Output Summary (Last 24 hours) at 12/06/2019 1546 Last data filed at 12/05/2019 2113 Gross  per 24 hour  Intake 700 ml  Output 200 ml  Net 500 ml     Physical Exam  Awake Alert, Oriented X 3, No new F.N deficits, Normal affect Symmetrical Chest wall movement, Good air movement bilaterally, CTAB RRR,No Gallops,Rubs or new Murmurs, No Parasternal Heave +ve B.Sounds, Abd Soft, No tenderness, No rebound - guarding or rigidity. No Cyanosis, Clubbing or edema, No new Rash or bruise       Data Review:    CBC Recent Labs  Lab 12/04/19 1754 12/04/19 2311 12/05/19 0606 12/06/19 0247  WBC 6.4  --  4.8 4.9  HGB 10.3* 8.5* 9.0* 8.6*  HCT 32.8* 25.0*  29.2* 27.5*  PLT 130*  --  150 224  MCV 96.5  --  98.6 97.5  MCH 30.3  --  30.4 30.5  MCHC 31.4  --  30.8 31.3  RDW 14.6  --  14.8 14.6  LYMPHSABS 0.7  --  0.5* 0.5*  MONOABS 0.4  --  0.2 0.3  EOSABS 0.0  --  0.0 0.0  BASOSABS 0.0  --  0.0 0.0    Chemistries  Recent Labs  Lab 12/04/19 1754 12/04/19 2311 12/05/19 0606 12/06/19 0247  NA 135 137 134* 134*  K 5.3* 4.6 5.1 5.5*  CL 99  --  101 102  CO2 19*  --  21* 22  GLUCOSE 193*  --  147* 128*  BUN 76*  --  64* 50*  CREATININE 1.73*  --  1.48* 1.30*  CALCIUM 9.9  --  9.0 9.4  MG  --   --  2.5* 2.1  AST 35  --  42* 23  ALT 21  --  20 18  ALKPHOS 48  --  43 39  BILITOT 1.1  --  0.6 0.4   ------------------------------------------------------------------------------------------------------------------ No results for input(s): CHOL, HDL, LDLCALC, TRIG, CHOLHDL, LDLDIRECT in the last 72 hours.  Lab Results  Component Value Date   HGBA1C 7.5 (H) 11/15/2019   ------------------------------------------------------------------------------------------------------------------ No results for input(s): TSH, T4TOTAL, T3FREE, THYROIDAB in the last 72 hours.  Invalid input(s): FREET3 ------------------------------------------------------------------------------------------------------------------ No results for input(s): VITAMINB12, FOLATE, FERRITIN, TIBC, IRON, RETICCTPCT in the last 72 hours.  Coagulation profile No results for input(s): INR, PROTIME in the last 168 hours.  Recent Labs    12/05/19 0602 12/06/19 0247  DDIMER 3.85* 3.87*    Cardiac Enzymes No results for input(s): CKMB, TROPONINI, MYOGLOBIN in the last 168 hours.  Invalid input(s): CK ------------------------------------------------------------------------------------------------------------------    Component Value Date/Time   BNP 64.6 12/05/2019 0602    Inpatient Medications  Scheduled Meds: . albuterol  2 puff Inhalation Q6H  . allopurinol  300  mg Oral Daily  . bicalutamide  50 mg Oral QHS  . feeding supplement (ENSURE ENLIVE)  237 mL Oral TID BM  . fenofibrate  160 mg Oral QPM  . finasteride  5 mg Oral QHS  . insulin aspart  0-15 Units Subcutaneous TID AC & HS  . methylPREDNISolone (SOLU-MEDROL) injection  40 mg Intravenous Q12H  . [START ON 12/10/2019] pantoprazole  40 mg Intravenous Q12H  . saccharomyces boulardii  250 mg Oral BID   Continuous Infusions: . sodium chloride    . pantoprozole (PROTONIX) infusion    . pantoprazole (PROTONIX) IVPB     PRN Meds:.acetaminophen, colchicine, ondansetron **OR** ondansetron (ZOFRAN) IV, oxyCODONE-acetaminophen, polyethylene glycol  Micro Results Recent Results (from the past 240 hour(s))  Blood culture (routine x 2)     Status: Abnormal (Preliminary result)  Collection Time: 12/04/19  5:54 PM   Specimen: BLOOD  Result Value Ref Range Status   Specimen Description BLOOD RIGHT ANTECUBITAL  Final   Special Requests   Final    BOTTLES DRAWN AEROBIC AND ANAEROBIC Blood Culture adequate volume   Culture  Setup Time   Final    GRAM POSITIVE COCCI IN CLUSTERS ANAEROBIC BOTTLE ONLY CRITICAL RESULT CALLED TO, READ BACK BY AND VERIFIED WITH: J EAKINS,PHARMD AT 2056 12/05/19 BY L BENFIELD    Culture (A)  Final    STAPHYLOCOCCUS SPECIES (COAGULASE NEGATIVE) THE SIGNIFICANCE OF ISOLATING THIS ORGANISM FROM A SINGLE SET OF BLOOD CULTURES WHEN MULTIPLE SETS ARE DRAWN IS UNCERTAIN. PLEASE NOTIFY THE MICROBIOLOGY DEPARTMENT WITHIN ONE WEEK IF SPECIATION AND SENSITIVITIES ARE REQUIRED. Performed at [facility], [place] [address], [place], [place] [address]    Report Status PENDING  Incomplete  Blood Culture ID Panel (Reflexed)     Status: Abnormal   Collection Time: 12/04/19  5:54 PM  Result Value Ref Range Status   Enterococcus faecalis NOT DETECTED NOT DETECTED Final   Enterococcus Faecium NOT DETECTED NOT DETECTED Final   Listeria monocytogenes NOT DETECTED NOT DETECTED Final    Staphylococcus species DETECTED (A) NOT DETECTED Final    Comment: CRITICAL RESULT CALLED TO, READ BACK BY AND VERIFIED WITH: J EAKINS,PHARMD AT 2056 12/05/19 BY L BENFIELD    Staphylococcus aureus (BCID) NOT DETECTED NOT DETECTED Final   Staphylococcus epidermidis DETECTED (A) NOT DETECTED Final    Comment: Methicillin (oxacillin) resistant coagulase negative staphylococcus. Possible blood culture contaminant (unless isolated from more than one blood culture draw or clinical case suggests pathogenicity). No antibiotic treatment is indicated for blood  culture contaminants. CRITICAL RESULT CALLED TO, READ BACK BY AND VERIFIED WITH: J EAKINS,PHARMD AT 2056 12/05/19 BY L BENFIELD    Staphylococcus lugdunensis NOT DETECTED NOT DETECTED Final   Streptococcus species NOT DETECTED NOT DETECTED Final   Streptococcus agalactiae NOT DETECTED NOT DETECTED Final   Streptococcus pneumoniae NOT DETECTED NOT DETECTED Final   Streptococcus pyogenes NOT DETECTED NOT DETECTED Final   A.calcoaceticus-baumannii NOT DETECTED NOT DETECTED Final   Bacteroides fragilis NOT DETECTED NOT DETECTED Final   Enterobacterales NOT DETECTED NOT DETECTED Final   Enterobacter cloacae complex NOT DETECTED NOT DETECTED Final   Escherichia coli NOT DETECTED NOT DETECTED Final   Klebsiella aerogenes NOT DETECTED NOT DETECTED Final   Klebsiella oxytoca NOT DETECTED NOT DETECTED Final   Klebsiella pneumoniae NOT DETECTED NOT DETECTED Final   Proteus species NOT DETECTED NOT DETECTED Final   Salmonella species NOT DETECTED NOT DETECTED Final   Serratia marcescens NOT DETECTED NOT DETECTED Final   Haemophilus influenzae NOT DETECTED NOT DETECTED Final   Neisseria meningitidis NOT DETECTED NOT DETECTED Final   Pseudomonas aeruginosa NOT DETECTED NOT DETECTED Final   Stenotrophomonas maltophilia NOT DETECTED NOT DETECTED Final   Candida albicans NOT DETECTED NOT DETECTED Final   Candida auris NOT DETECTED NOT DETECTED Final    Candida glabrata NOT DETECTED NOT DETECTED Final   Candida krusei NOT DETECTED NOT DETECTED Final   Candida parapsilosis NOT DETECTED NOT DETECTED Final   Candida tropicalis NOT DETECTED NOT DETECTED Final   Cryptococcus neoformans/gattii NOT DETECTED NOT DETECTED Final   Methicillin resistance mecA/C DETECTED (A) NOT DETECTED Final    Comment: CRITICAL RESULT CALLED TO, READ BACK BY AND VERIFIED WITH: J EAKINS,PHARMD AT 2056 12/05/19 BY L BENFIELD Performed at [facility], [address], [place], [place]  [address]   Blood culture (routine x 2)     Status: None (Preliminary result)   Collection Time: 12/04/19  6:05 PM   Specimen: BLOOD RIGHT HAND  Result Value Ref Range Status   Specimen Description BLOOD RIGHT HAND  Final   Special Requests   Final    BOTTLES DRAWN AEROBIC AND ANAEROBIC Blood Culture adequate volume   Culture   Final    NO GROWTH 2 DAYS Performed at [facility], [place] [address], [place], [place] [address]    Report Status PENDING  Incomplete    Radiology Reports DG Lumbar Spine 2-3 Views  Result Date: 12/04/2019 CLINICAL DATA:  Lumbosacral back pain. EXAM: LUMBAR SPINE - 2-3 VIEW COMPARISON:  Reformats from abdominal CT earlier today. FINDINGS: Normal alignment. Vertebral body heights are preserved. Significant disc space narrowing at L4-L5 and L5-S1. Facet hypertrophy at these levels. Multilevel endplate spurring of the upper lumbar spine with preservation of disc spaces. No fracture or focal lesion. Retained excreted IV contrast within dilated left renal collecting system as seen on CT earlier today. There is excreted IV contrast in the urinary bladder. IMPRESSION: 1. 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 No acute osseous abnormality. 2. Retained excreted IV contrast within dilated left renal collecting system as seen on CT earlier today. Electronically Signed   By: Keith Rake M.D.   On: 12/04/2019 23:48   CT  Chest W Contrast  Result Date: 11/15/2019 CLINICAL DATA:  79 year old male with pneumonia. Concern for abscess or effusion. EXAM: CT CHEST, ABDOMEN, AND PELVIS WITH CONTRAST TECHNIQUE: Multidetector CT imaging of the chest, abdomen and pelvis was performed following the standard protocol during bolus administration of intravenous contrast. CONTRAST:  167mL OMNIPAQUE IOHEXOL 300 MG/ML  SOLN COMPARISON:  Chest CT dated 02/14/2018. Chest radiograph dated 11/15/2019. FINDINGS: CT CHEST FINDINGS Cardiovascular: There is no cardiomegaly or pericardial effusion. Advanced 3 vessel coronary vascular calcification. There is advanced calcified and noncalcified plaque of the thoracic aorta. No aneurysmal dilatation or dissection. The central pulmonary arteries appear patent. Mediastinum/Nodes: Right hilar adenopathy measuring 13 mm. Subcarinal lymph node measures 10 mm in short axis. The esophagus and the thyroid gland are grossly unremarkable. No mediastinal fluid collection. Lungs/Pleura: There is background of emphysema. Bibasilar subpleural reticulation as seen on the prior CT in keeping with interstitial lung disease. Overall slight progression of the fibrotic changes since the prior CT. There is a 2.6 x 2.7 cm cavitary nodule in the superior segment of the right lower lobe which may represent an abscess, an infected pneumatocele. Fungal infection, TB or cavitary neoplasm are not excluded. Clinical correlation and follow-up to resolution is recommended. Several scattered bilateral lower lobe nodular densities, present on the prior CT. No lobar consolidation, pleural effusion, or pneumothorax. The central airways are patent. Musculoskeletal: No chest wall mass or suspicious bone lesions identified. CT ABDOMEN PELVIS FINDINGS No intra-abdominal free air or free fluid. Hepatobiliary: There is fatty infiltration of the liver. Slight irregularity of the liver contour may represent early changes of cirrhosis. Elastography may  provide better evaluation. There is tumefactive sludge versus noncalcified stones within the gallbladder. There is diffuse gallbladder wall thickening and small pericholecystic fluid. Findings may be related to underlying liver disease although acute cholecystitis is not excluded. Further evaluation with right upper quadrant ultrasound recommended. Pancreas: Unremarkable. No pancreatic ductal dilatation or surrounding inflammatory changes. Spleen: Normal in size without focal abnormality. Adrenals/Urinary Tract: The adrenal glands are unremarkable. Multiple bilateral renal  cysts with the largest a lobulated appearing left renal interpolar cyst measuring up to 13 cm in length. Several additional subcentimeter hypodensities are too small to characterize. There is no hydronephrosis on either side. There is symmetric enhancement and excretion of contrast by both kidneys. Mild bilateral perinephric stranding, nonspecific. Correlation with urinalysis recommended to exclude UTI. The visualized ureters and urinary bladder appear unremarkable. Stomach/Bowel: There is severe sigmoid diverticulosis without active inflammatory changes. There is no bowel obstruction or active inflammation. The appendix is normal. Vascular/Lymphatic: There is advanced aortoiliac atherosclerotic disease. 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 The graft appears patent. There is advanced atherosclerotic calcification of the common femoral arteries bilaterally, right greater left. The IVC is unremarkable. No portal venous gas. There is no adenopathy. Reproductive: Prostatectomy. 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 Musculoskeletal: There is degenerative changes of the spine. No acute osseous pathology. IMPRESSION: 1. A 2.6 x 2.7 cm cavitary nodule in the superior segment of the right lower lobe may represent an abscess, an infected pneumatocele. Fungal infection, TB  or cavitary neoplasm are not excluded. Clinical correlation and follow-up to resolution is recommended. 2. Emphysema with slight progression of the interstitial lung disease since the prior CT. 3. Fatty liver with possible early changes of cirrhosis. Elastography may provide better evaluation. 4. Gallbladder tumefactive sludge versus noncalcified stones. Diffuse gallbladder wall thickening and small pericholecystic fluid may be related to underlying liver disease although acute cholecystitis is not excluded. Further evaluation with right upper quadrant ultrasound recommended. 5. Severe sigmoid diverticulosis. No bowel obstruction. Normal appendix. 6. Aortic Atherosclerosis (ICD10-I70.0) and Emphysema (ICD10-J43.9). Electronically Signed   By: Anner Crete M.D.   On: 11/15/2019 19:11   CT Angio Chest PE W and/or Wo Contrast  Result Date: 12/04/2019 CLINICAL DATA:  COVID pneumonia, dyspnea, productive cough, unspecified abdominal pain EXAM: CT ANGIOGRAPHY CHEST CT ABDOMEN AND PELVIS WITH CONTRAST TECHNIQUE: Multidetector CT imaging of the chest was performed using the standard protocol during bolus administration of intravenous contrast. Multiplanar CT image reconstructions and MIPs were obtained to evaluate the vascular anatomy. Multidetector CT imaging of the abdomen and pelvis was performed using the standard protocol during bolus administration of intravenous contrast. CONTRAST:  4mL OMNIPAQUE IOHEXOL 350 MG/ML SOLN COMPARISON:  11/15/2019 FINDINGS: CTA CHEST FINDINGS Cardiovascular: There is excellent opacification of the pulmonary arterial tree. There is no intraluminal filling defect to suggest acute pulmonary embolism. There is borderline enlargement of the central pulmonary arteries suggesting mild pulmonary arterial hypertension. There is extensive multi-vessel coronary artery calcification noted. Global cardiac size is within normal limits. Left ventricular hypertrophy is noted. There is mild  calcification of the aortic valve leaflets noted. The thoracic aorta demonstrates mild calcification within the aortic arch. Thoracic aorta is of normal caliber. Mediastinum/Nodes: Multiple pathologically enlarged right hilar lymph nodes are identifiedl measuring up to 19 mm in short axis diameter. Lungs/Pleura: Since the prior examination, multifocal ground-glass pulmonary infiltrates and peripheral areas of consolidation have significantly progressed in keeping with progressive atypical infection and compatible with progressive changes of COVID-19 pneumonia. There Isq, however, a focal cavitary nodule identified within the superior segment of the right lower lobe measuring 2.5 x 2.5 cm, similar to that noted on prior examination, with associated pathologic right hilar adenopathy. Differential considerations are unchanged, though malignancy is strongly considered given the associated pathologic right hilar adenopathy. No pneumothorax or pleural effusion. No central obstructing mass. Bronchial wall thickening is  again identified in keeping with airway inflammation. Musculoskeletal: No chest wall abnormality. No acute or significant osseous findings. Review of the MIP images confirms the above findings. CT ABDOMEN and PELVIS FINDINGS Hepatobiliary: Layering high density material within the gallbladder lumen is in keeping with inspissated sludge or tiny gallstones. There is pneumobilia now identified within the biliary tree suggesting changes of interval sphincterotomy. No intra or extrahepatic biliary ductal dilation. The liver is unremarkable. Pancreas: Unremarkable Spleen: Unremarkable Adrenals/Urinary Tract: The adrenal glands are unremarkable. The kidneys are normal in size and position. There is mild left hydronephrosis to the level of the ureteropelvic junction with decompressive the ureter thereafter in keeping with a moderate left UPJ obstruction. Superimposed parapelvic and cortical simple cysts are  identified. There is symmetric enhancement of the left kidney however. Multiple simple cortical cysts are seen within the right kidney. No hydronephrosis on the right. No renal or ureteral calculi. The bladder is unremarkable. Stomach/Bowel: Moderate sigmoid diverticulosis is identified without superimposed inflammatory change. Scattered diverticula are seen within the ascending colon. Stomach, small bowel, and large bowel are otherwise unremarkable. Appendix normal. No free intraperitoneal gas or fluid Vascular/Lymphatic: Moderate soft mural plaque is identified within the distal descending thoracic aorta at the diaphragmatic hiatus. 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 Extensive atherosclerotic calcification is seen within the terminal lower extremity arterial inflow and visualized outflow. There is no pathologic adenopathy within the abdomen and pelvis. Reproductive: Status post prostatectomy. Other: Rectum unremarkable. Musculoskeletal: No lytic or blastic bone lesions within the abdomen. Review of the MIP images confirms the above findings. IMPRESSION: 1. No evidence of acute pulmonary embolism. 2. Significant interval progression of multifocal ground-glass pulmonary infiltrates and peripheral areas of consolidation in keeping with progressive atypical infection and compatible with COVID-19 pneumonia. 3. Persistent cavitary nodule within the superior segment of the right lower lobe, with associated pathologic right hilar adenopathy. Differential considerations are unchanged, though malignancy is strongly considered given the associated pathologic right hilar adenopathy. Short-term follow-up imaging or PET-CT examination may be helpful once the patient's acute issues have resolved for further evaluation. 4. Interval probable sphincterotomy with pneumobilia. Cholelithiasis persists though superimposed pericholecystic inflammatory change has resolved. 5. Moderate left UPJ obstruction. 6.  Diverticulosis without evidence of acute diverticulitis. Electronically Signed   By: Fidela Salisbury MD   On: 12/04/2019 20:02   NM Hepatobiliary Liver Func  Result Date: 11/17/2019 CLINICAL DATA:  Nausea and vomiting. Cholelithiasis and choledocholithiasis. EXAM: NUCLEAR MEDICINE HEPATOBILIARY IMAGING TECHNIQUE: Sequential images of the abdomen were obtained out to 60 minutes following intravenous administration of radiopharmaceutical. RADIOPHARMACEUTICALS:  5.5 mCi Tc-29m  Choletec IV COMPARISON:  None. FINDINGS: Prompt uptake and biliary excretion of activity by the liver is seen. Gallbladder activity is visualized, consistent with patency of cystic duct. Biliary activity passes into small bowel, consistent with patent common bile duct. Reflux of biliary activity into the stomach is noted. IMPRESSION: Patency of both cystic and common bile ducts is demonstrated. Bile reflux noted. Electronically Signed   By: Marlaine Hind M.D.   On: 11/17/2019 15:15   CT ABDOMEN PELVIS W CONTRAST  Result Date: 12/04/2019 CLINICAL DATA:  COVID pneumonia, dyspnea, productive cough, unspecified abdominal pain EXAM: CT ANGIOGRAPHY CHEST CT ABDOMEN AND PELVIS WITH CONTRAST TECHNIQUE: Multidetector CT imaging of the chest was performed using the standard protocol during bolus administration of intravenous contrast. Multiplanar CT image reconstructions and MIPs were obtained to evaluate the vascular anatomy. Multidetector CT imaging of the abdomen and pelvis was performed using the  standard protocol during bolus administration of intravenous contrast. CONTRAST:  20mL OMNIPAQUE IOHEXOL 350 MG/ML SOLN COMPARISON:  11/15/2019 FINDINGS: CTA CHEST FINDINGS Cardiovascular: There is excellent opacification of the pulmonary arterial tree. There is no intraluminal filling defect to suggest acute pulmonary embolism. There is borderline enlargement of the central pulmonary arteries suggesting mild pulmonary arterial hypertension. There is  extensive multi-vessel coronary artery calcification noted. Global cardiac size is within normal limits. Left ventricular hypertrophy is noted. There is mild calcification of the aortic valve leaflets noted. The thoracic aorta demonstrates mild calcification within the aortic arch. Thoracic aorta is of normal caliber. Mediastinum/Nodes: Multiple pathologically enlarged right hilar lymph nodes are identifiedl measuring up to 19 mm in short axis diameter. Lungs/Pleura: Since the prior examination, multifocal ground-glass pulmonary infiltrates and peripheral areas of consolidation have significantly progressed in keeping with progressive atypical infection and compatible with progressive changes of COVID-19 pneumonia. There Isq, however, a focal cavitary nodule identified within the superior segment of the right lower lobe measuring 2.5 x 2.5 cm, similar to that noted on prior examination, with associated pathologic right hilar adenopathy. Differential considerations are unchanged, though malignancy is strongly considered given the associated pathologic right hilar adenopathy. No pneumothorax or pleural effusion. No central obstructing mass. Bronchial wall thickening is again identified in keeping with airway inflammation. Musculoskeletal: No chest wall abnormality. No acute or significant osseous findings. Review of the MIP images confirms the above findings. CT ABDOMEN and PELVIS FINDINGS Hepatobiliary: Layering high density material within the gallbladder lumen is in keeping with inspissated sludge or tiny gallstones. There is pneumobilia now identified within the biliary tree suggesting changes of interval sphincterotomy. No intra or extrahepatic biliary ductal dilation. The liver is unremarkable. Pancreas: Unremarkable Spleen: Unremarkable Adrenals/Urinary Tract: The adrenal glands are unremarkable. The kidneys are normal in size and position. There is mild left hydronephrosis to the level of the ureteropelvic  junction with decompressive the ureter thereafter in keeping with a moderate left UPJ obstruction. Superimposed parapelvic and cortical simple cysts are identified. There is symmetric enhancement of the left kidney however. Multiple simple cortical cysts are seen within the right kidney. No hydronephrosis on the right. No renal or ureteral calculi. The bladder is unremarkable. Stomach/Bowel: Moderate sigmoid diverticulosis is identified without superimposed inflammatory change. Scattered diverticula are seen within the ascending colon. Stomach, small bowel, and large bowel are otherwise unremarkable. Appendix normal. No free intraperitoneal gas or fluid Vascular/Lymphatic: Moderate soft mural plaque is identified within the distal descending thoracic aorta at the diaphragmatic hiatus. 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 Extensive atherosclerotic calcification is seen within the terminal lower extremity arterial inflow and visualized outflow. There is no pathologic adenopathy within the abdomen and pelvis. Reproductive: Status post prostatectomy. Other: Rectum unremarkable. Musculoskeletal: No lytic or blastic bone lesions within the abdomen. Review of the MIP images confirms the above findings. IMPRESSION: 1. No evidence of acute pulmonary embolism. 2. Significant interval progression of multifocal ground-glass pulmonary infiltrates and peripheral areas of consolidation in keeping with progressive atypical infection and compatible with COVID-19 pneumonia. 3. Persistent cavitary nodule within the superior segment of the right lower lobe, with associated pathologic right hilar adenopathy. Differential considerations are unchanged, though malignancy is strongly considered given the associated pathologic right hilar adenopathy. Short-term follow-up imaging or PET-CT examination may be helpful once the patient's acute issues have resolved for further evaluation. 4. Interval probable sphincterotomy with  pneumobilia. Cholelithiasis persists though superimposed pericholecystic inflammatory change has resolved. 5. Moderate left UPJ obstruction. 6. Diverticulosis  without evidence of acute diverticulitis. Electronically Signed   By: Fidela Salisbury MD   On: 12/04/2019 20:02   CT Abdomen Pelvis W Contrast  Result Date: 11/15/2019 CLINICAL DATA:  79 year old male with pneumonia. Concern for abscess or effusion. EXAM: CT CHEST, ABDOMEN, AND PELVIS WITH CONTRAST TECHNIQUE: Multidetector CT imaging of the chest, abdomen and pelvis was performed following the standard protocol during bolus administration of intravenous contrast. CONTRAST:  144mL OMNIPAQUE IOHEXOL 300 MG/ML  SOLN COMPARISON:  Chest CT dated 02/14/2018. Chest radiograph dated 11/15/2019. FINDINGS: CT CHEST FINDINGS Cardiovascular: There is no cardiomegaly or pericardial effusion. Advanced 3 vessel coronary vascular calcification. There is advanced calcified and noncalcified plaque of the thoracic aorta. No aneurysmal dilatation or dissection. The central pulmonary arteries appear patent. Mediastinum/Nodes: Right hilar adenopathy measuring 13 mm. Subcarinal lymph node measures 10 mm in short axis. The esophagus and the thyroid gland are grossly unremarkable. No mediastinal fluid collection. Lungs/Pleura: There is background of emphysema. Bibasilar subpleural reticulation as seen on the prior CT in keeping with interstitial lung disease. Overall slight progression of the fibrotic changes since the prior CT. There is a 2.6 x 2.7 cm cavitary nodule in the superior segment of the right lower lobe which may represent an abscess, an infected pneumatocele. Fungal infection, TB or cavitary neoplasm are not excluded. Clinical correlation and follow-up to resolution is recommended. Several scattered bilateral lower lobe nodular densities, present on the prior CT. No lobar consolidation, pleural effusion, or pneumothorax. The central airways are patent. Musculoskeletal:  No chest wall mass or suspicious bone lesions identified. CT ABDOMEN PELVIS FINDINGS No intra-abdominal free air or free fluid. Hepatobiliary: There is fatty infiltration of the liver. Slight irregularity of the liver contour may represent early changes of cirrhosis. Elastography may provide better evaluation. There is tumefactive sludge versus noncalcified stones within the gallbladder. There is diffuse gallbladder wall thickening and small pericholecystic fluid. Findings may be related to underlying liver disease although acute cholecystitis is not excluded. Further evaluation with right upper quadrant ultrasound recommended. Pancreas: Unremarkable. No pancreatic ductal dilatation or surrounding inflammatory changes. Spleen: Normal in size without focal abnormality. Adrenals/Urinary Tract: The adrenal glands are unremarkable. Multiple bilateral renal cysts with the largest a lobulated appearing left renal interpolar cyst measuring up to 13 cm in length. Several additional subcentimeter hypodensities are too small to characterize. There is no hydronephrosis on either side. There is symmetric enhancement and excretion of contrast by both kidneys. Mild bilateral perinephric stranding, nonspecific. Correlation with urinalysis recommended to exclude UTI. The visualized ureters and urinary bladder appear unremarkable. Stomach/Bowel: There is severe sigmoid diverticulosis without active inflammatory changes. There is no bowel obstruction or active inflammation. The appendix is normal. Vascular/Lymphatic: There is advanced aortoiliac atherosclerotic disease. 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 The graft appears patent. There is advanced atherosclerotic calcification of the common femoral arteries bilaterally, right greater left. The IVC is unremarkable. No portal venous gas. There is no adenopathy. Reproductive: Prostatectomy. Other: The left testicle appears to be  in the left inguinal canal. Musculoskeletal: There is degenerative changes of the spine. No acute osseous pathology. IMPRESSION: 1. A 2.6 x 2.7 cm cavitary nodule in the superior segment of the right lower lobe may represent an abscess, an infected pneumatocele. Fungal infection, TB or cavitary neoplasm are not excluded. Clinical correlation and follow-up to resolution is recommended. 2. Emphysema with slight progression of the interstitial lung disease since the prior CT. 3. Fatty liver  with possible early changes of cirrhosis. Elastography may provide better evaluation. 4. Gallbladder tumefactive sludge versus noncalcified stones. Diffuse gallbladder wall thickening and small pericholecystic fluid may be related to underlying liver disease although acute cholecystitis is not excluded. Further evaluation with right upper quadrant ultrasound recommended. 5. Severe sigmoid diverticulosis. No bowel obstruction. Normal appendix. 6. Aortic Atherosclerosis (ICD10-I70.0) and Emphysema (ICD10-J43.9). Electronically Signed   By: Anner Crete M.D.   On: 11/15/2019 19:11   US RENAL  Result Date: 11/18/2019 CLINICAL DATA:  Acute renal injury, COVID-19 positivity EXAM: RENAL / URINARY TRACT ULTRASOUND COMPLETE COMPARISON:  11/15/2019 FINDINGS: Right Kidney: Renal measurements: 12.3 x 5.1 x 4.3 cm. = volume: 140 mL. Less than 10 cysts are noted. The largest of these measures 2.7 cm in the midportion of the right kidney. Left Kidney: Renal measurements: 14.3 x 5.7 x 5.5 cm. = volume: 233 mL. Less than 10 cysts are noted. The largest of these measures 7.3 cm in greatest dimension. These are stable from the prior CT examination Bladder: Appears normal for degree of bladder distention. Other: Gallbladder is partially distended with echogenic material within similar to that seen on prior exam consistent with gallbladder sludge and stones. IMPRESSION: Gallbladder sludge and stones. Bilateral renal cysts which are simple in  nature. No obstructive changes are noted. Electronically Signed   By: Inez Catalina M.D.   On: 11/18/2019 03:03   MR ABDOMEN MRCP WO CONTRAST  Result Date: 11/17/2019 CLINICAL DATA:  Cholelithiasis EXAM: MRI ABDOMEN WITHOUT CONTRAST  (INCLUDING MRCP) TECHNIQUE: Multiplanar multisequence MR imaging of the abdomen was performed. Heavily T2-weighted images of the biliary and pancreatic ducts were obtained, and three-dimensional MRCP images were rendered by post processing. COMPARISON:  CT chest, abdomen and pelvis of November 15, 2019 FINDINGS: Lower chest: Small effusions at the lung bases. As basilar airspace disease and parenchymal findings demonstrated on the recent chest CT are not well evaluated on the MRI. Hepatobiliary: Mild hepatic steatosis. No focal, suspicious hepatic lesion, assessment limited by the noncontrast imaging. Pericholecystic stranding and signs of wall thickening. Mild biliary duct distension with numerous filling defects in the common bile duct compatible with multiple small stones. Largest approximately 5 mm. At least 4-5 additional small biliary calculi in the common bile duct. Pancreas:  No ductal dilation.  No peripancreatic inflammation. Spleen: Skin normal in size and contour without focal, suspicious lesion. Adrenals/Urinary Tract:  Adrenal glands are normal spleen. Bilateral renal cysts. Asymmetric albeit slightly Peri nephric stranding greater on the LEFT than the RIGHT. Dilated collecting systems in the upper pole and in the lower pole LEFT kidney perhaps due to mass-effect upon collecting systems in the setting of large renal sinus cysts with similar appearance to prior imaging studies. Stomach/Bowel: Stomach gastrointestinal tract with limited assessment, unremarkable to the extent evaluated. 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 Other:  None. Musculoskeletal: No suspicious bone lesions identified.  IMPRESSION: 1. Choledocholithiasis with mild biliary duct dilation as described. 2. Pericholecystic stranding and signs of wall thickening, remaining suspicious for acute cholecystitis. If there are discordant clinical findings, HIDA scan may be helpful for further assessment. 3. Renal sinus cysts and stable collecting system dilation on the LEFT with renal cysts on the RIGHT and mild perinephric stranding, nonspecific and similar to previous imaging studies. 4. Small effusions at the lung bases. 5. Mild hepatic steatosis. Electronically Signed   By: Zetta Bills M.D.   On: 11/17/2019 08:06   DG  Chest Portable 1 View  Result Date: 12/04/2019 CLINICAL DATA:  Shortness of breath EXAM: PORTABLE CHEST 1 VIEW COMPARISON:  11/16/2019 FINDINGS: Patchy peripheral airspace opacities throughout the right lung and in the left lower lobe concerning for pneumonia. Heart is normal size. No effusions or pneumothorax. No acute bony abnormality. IMPRESSION: Patchy peripheral airspace opacities, right greater than left concerning for pneumonia, possibly COVID pneumonia. Electronically Signed   By: Rolm Baptise M.D.   On: 12/04/2019 18:30   DG Chest Port 1 View  Result Date: 11/16/2019 CLINICAL DATA:  Shortness of breath and fever EXAM: PORTABLE CHEST 1 VIEW COMPARISON:  Chest radiograph and chest CT November 15, 2019 FINDINGS: There is a degree of underlying fibrosis. The cavitary lesion in the right lower lobe seen 1 day prior is less well seen by radiography although is evident, measuring 2.5 x 2.4 cm. There is a degree of lower lobe bronchiectatic change. No new opacity evident. Heart size and pulmonary vascularity are normal. Lymph node prominence seen on CT is not appreciable by radiography. There is aortic atherosclerosis. No bone lesions appreciable. IMPRESSION: Cavitary lesion right lower lobe, much better seen on recent CT. This nodular lesion measures 2.5 x 2.4 cm on portable radiographic examination. There is an  underlying degree of fibrosis in the bases. No new opacity evident. Cardiac silhouette stable. Aortic Atherosclerosis (ICD10-I70.0). Electronically Signed   By: Lowella Grip III M.D.   On: 11/16/2019 09:34   DG Chest Port 1 View  Result Date: 11/15/2019 CLINICAL DATA:  Evaluate for infection. EXAM: PORTABLE CHEST 1 VIEW COMPARISON:  07/27/2015 FINDINGS: Lungs are adequately inflated without effusion or pneumothorax. Mild increased density over the right hilum/perihilar region. Cardiomediastinal silhouette and remainder the exam is unchanged. IMPRESSION: Minimal increased density over the right hilar/perihilar region. Medial airspace process is possible. Consider PA and lateral chest radiograph for better evaluation. Electronically Signed   By: Marin Olp M.D.   On: 11/15/2019 17:04   DG ERCP BILIARY & PANCREATIC DUCTS  Result Date: 11/19/2019 CLINICAL DATA:  ERCP for bile duct stones. EXAM: ERCP TECHNIQUE: Multiple spot images obtained with the fluoroscopic device and submitted for interpretation post-procedure. COMPARISON:  MRCP-11/17/2019; nuclear medicine HIDA scan-11/17/2019 FINDINGS: Eight spot intraoperative fluoroscopic images of the right upper abdominal quadrant during ERCP are provided for review Initial image demonstrates an ERCP probe overlying the right upper abdominal quadrant Subsequent images demonstrate selective cannulation opacification of the common bile duct. There are several filling defects within the CBD, potentially representative of air bubbles versus choledocholithiasis. Subsequent images demonstrate insufflation of a balloon within the central aspect of the CBD with subsequent biliary sweeping and presumed sphincterotomy. There is minimal opacification the cystic duct with passage of contrast to the level of the gallbladder lumen. There is minimal opacification intrahepatic biliary tree which appears nondilated. IMPRESSION: ERCP with biliary sweeping and presumed  sphincterotomy as above. These images were submitted for radiologic interpretation only. Please see the procedural report for the amount of contrast and the fluoroscopy time utilized. Electronically Signed   By: Sandi Mariscal M.D.   On: 11/19/2019 07:40   VAS [place] LOWER EXTREMITY VENOUS (DVT)  Result Date: 11/21/2019  Lower Venous DVTStudy Indications: Elevated d-dimer.  Comparison Study: 07-27-2015 LEV study Performing Technologist: Darlin Coco  Examination Guidelines: A complete evaluation includes B-mode imaging, spectral Doppler, color Doppler, and power Doppler as needed of all accessible portions of each vessel. Bilateral testing is considered an integral part of a complete examination. Limited examinations for reoccurring  indications may be performed as noted. The reflux portion of the exam is performed with the patient in reverse Trendelenburg.  +---------+---------------+---------+-----------+----------+--------------+ RIGHT    CompressibilityPhasicitySpontaneityPropertiesThrombus Aging +---------+---------------+---------+-----------+----------+--------------+ CFV      Full           Yes      Yes                                 +---------+---------------+---------+-----------+----------+--------------+ SFJ      Full           Yes      Yes                                 +---------+---------------+---------+-----------+----------+--------------+ FV Prox  Full                                                        +---------+---------------+---------+-----------+----------+--------------+ FV Mid   Full                                                        +---------+---------------+---------+-----------+----------+--------------+ FV DistalFull                                                        +---------+---------------+---------+-----------+----------+--------------+ PFV      Full                                                         +---------+---------------+---------+-----------+----------+--------------+ POP      Full           Yes      Yes                                 +---------+---------------+---------+-----------+----------+--------------+ PTV      Full                                                        +---------+---------------+---------+-----------+----------+--------------+ PERO     Full                                                        +---------+---------------+---------+-----------+----------+--------------+   +---------+---------------+---------+-----------+----------+--------------+ LEFT     CompressibilityPhasicitySpontaneityPropertiesThrombus Aging +---------+---------------+---------+-----------+----------+--------------+ CFV      Full           Yes  Yes                                 +---------+---------------+---------+-----------+----------+--------------+ SFJ      Full           Yes      Yes                                 +---------+---------------+---------+-----------+----------+--------------+ FV Prox  Full                                                        +---------+---------------+---------+-----------+----------+--------------+ FV Mid   Full                                                        +---------+---------------+---------+-----------+----------+--------------+ FV DistalFull                                                        +---------+---------------+---------+-----------+----------+--------------+ PFV      Full                                                        +---------+---------------+---------+-----------+----------+--------------+ POP      Full           Yes      Yes                                 +---------+---------------+---------+-----------+----------+--------------+ PTV      Full                                                         +---------+---------------+---------+-----------+----------+--------------+ PERO     Full                                                        +---------+---------------+---------+-----------+----------+--------------+     Summary: RIGHT: - There is no evidence of deep vein thrombosis in the lower extremity.  - No cystic structure found in the popliteal fossa.  LEFT: - There is no evidence of deep vein thrombosis in the lower extremity.  - No cystic structure found in the popliteal fossa.  *See table(s) above for measurements and observations. Electronically signed by [name] MD on 11/21/2019 at 4:19:57 AM.    Final    ECHOCARDIOGRAM LIMITED  Result  Date: 11/16/2019    ECHOCARDIOGRAM LIMITED REPORT   Patient Name:   HARSHA YUSKO Date of Exam: 11/16/2019 Medical Rec #:  [id number]         Height:       70.0 in Accession #:    [id number]        Weight:       187.8 lb Date of Birth:  [birth year]         BSA:          2.032 m Patient Age:    54 years          BP:           103/63 mmHg Patient Gender: M                 HR:           58 bpm. Exam Location:  Inpatient Procedure: Limited Echo, Color Doppler and Cardiac Doppler Indications:    CAD Native Vessel i25.10  History:        Patient has prior history of Echocardiogram examinations, most                 recent 07/29/2015. CAD; Risk Factors:Hypertension, Diabetes and                 Dyslipidemia. COVID+ at time of study.  Sonographer:    Raquel Sarna Senior RDCS Referring Phys: Bonnie  1. Left ventricular ejection fraction, by estimation, is 55 to 60%. The left ventricle has normal function. The left ventricle has no regional wall motion abnormalities. Left ventricular diastolic function could not be evaluated.  2. Right ventricular systolic function is normal. The right ventricular size is normal. There is mildly elevated pulmonary artery systolic pressure. The estimated right ventricular systolic pressure is 73.2 mmHg.   3. The mitral valve is grossly normal. Mild mitral valve regurgitation. No evidence of mitral stenosis.  4. The aortic valve is tricuspid. Aortic valve regurgitation is trivial. Mild aortic valve stenosis. Aortic valve mean gradient measures 9.9 mmHg. Aortic valve Vmax measures 2.15 m/s.  5. The inferior vena cava is dilated in size with <50% respiratory variability, suggesting right atrial pressure of 15 mmHg. Comparison(s): No significant change from prior study. FINDINGS  Left Ventricle: Left ventricular ejection fraction, by estimation, is 55 to 60%. The left ventricle has normal function. The left ventricle has no regional wall motion abnormalities. The left ventricular internal cavity size was normal in size. There is  no left ventricular hypertrophy. Right Ventricle: The right ventricular size is normal. No increase in right ventricular wall thickness. Right ventricular systolic function is normal. There is mildly elevated pulmonary artery systolic pressure. The tricuspid regurgitant velocity is 2.63  m/s, and with an assumed right atrial pressure of 15 mmHg, the estimated right ventricular systolic pressure is 20.2 mmHg. Left Atrium: Left atrial size was normal in size. Right Atrium: Right atrial size was normal in size. Pericardium: Trivial pericardial effusion is present. Presence of pericardial fat pad. Mitral Valve: The mitral valve is grossly normal. Mild mitral valve regurgitation. No evidence of mitral valve stenosis. Tricuspid Valve: The tricuspid valve is grossly normal. Tricuspid valve regurgitation is trivial. No evidence of tricuspid stenosis. Aortic Valve: The aortic valve is tricuspid. Aortic valve regurgitation is trivial. Mild aortic stenosis is present. Aortic valve mean gradient measures 9.9 mmHg. Aortic valve peak gradient measures 18.4 mmHg. Aortic valve area, by VTI measures 1.26 cm. Pulmonic Valve: The pulmonic valve  was grossly normal. Pulmonic valve regurgitation is trivial. No  evidence of pulmonic stenosis. Aorta: The aortic root is normal in size and structure. Venous: The inferior vena cava is dilated in size with less than 50% respiratory variability, suggesting right atrial pressure of 15 mmHg. IAS/Shunts: The atrial septum is grossly normal. LEFT VENTRICLE PLAX 2D LVOT diam:     2.10 cm LV SV:         64 LV SV Index:   31 LVOT Area:     3.46 cm  LV Volumes (MOD) LV vol d, MOD A4C: 145.0 ml LV vol s, MOD A4C: 67.7 ml LV SV MOD A4C:     145.0 ml RIGHT VENTRICLE RV S prime:     9.14 cm/s TAPSE (M-mode): 2.3 cm AORTIC VALVE AV Area (Vmax):    1.11 cm AV Area (Vmean):   1.14 cm AV Area (VTI):     1.26 cm AV Vmax:           214.63 cm/s AV Vmean:          148.039 cm/s AV VTI:            0.506 m AV Peak Grad:      18.4 mmHg AV Mean Grad:      9.9 mmHg LVOT Vmax:         68.50 cm/s LVOT Vmean:        48.900 cm/s LVOT VTI:          0.184 m LVOT/AV VTI ratio: 0.36 TRICUSPID VALVE TR Peak grad:   27.7 mmHg TR Vmax:        263.00 cm/s  SHUNTS Systemic VTI:  0.18 m Systemic Diam: 2.10 cm Eleonore Chiquito MD Electronically signed by Eleonore Chiquito MD Signature Date/Time: 11/16/2019/12:16:24 PM    Final    US Abdomen Limited RUQ  Result Date: 11/15/2019 CLINICAL DATA:  79 year old male with nausea vomiting. EXAM: ULTRASOUND ABDOMEN LIMITED RIGHT UPPER QUADRANT COMPARISON:  CT abdomen pelvis dated 11/15/2019. FINDINGS: Gallbladder: There is sludge and stone within the gallbladder. There is diffuse gallbladder wall thickening and edema and a small pericholecystic fluid. Evaluation for sonographic Murphy's sign was limited as the patient was pre-medicated. Common bile duct: Diameter: 7 mm Liver: There is diffuse increased liver echogenicity most commonly seen in the setting of fatty infiltration. Superimposed inflammation or fibrosis is not excluded. Clinical correlation is recommended. Portal vein is patent on color Doppler imaging with normal direction of blood flow towards the liver. Other: None.  IMPRESSION: 1. Gallbladder sludge and stones with diffuse edema as seen on the earlier CT. A hepatobiliary scintigraphy may provide better evaluation of the gallbladder if there is a high clinical concern for acute cholecystitis . 2. Fatty liver. Electronically Signed   By: Anner Crete M.D.   On: 11/15/2019 21:02      Phillips Climes M.D on 12/06/2019 at 3:46 PM    [facility] -  Office  [phone]

## 2019-12-06 NOTE — ED Notes (Signed)
This RN educated patient on how to use incentive spirometer.

## 2019-12-07 ENCOUNTER — Inpatient Hospital Stay (HOSPITAL_COMMUNITY): Payer: Medicare Other | Admitting: Certified Registered"

## 2019-12-07 ENCOUNTER — Encounter (HOSPITAL_COMMUNITY): Payer: Self-pay | Admitting: Internal Medicine

## 2019-12-07 ENCOUNTER — Inpatient Hospital Stay (HOSPITAL_COMMUNITY): Payer: Medicare Other

## 2019-12-07 ENCOUNTER — Encounter (HOSPITAL_COMMUNITY)
Admission: EM | Disposition: A | Discharge status: Home Health | Payer: Self-pay | Source: Home / Self Care | Attending: Internal Medicine

## 2019-12-07 ENCOUNTER — Other Ambulatory Visit: Payer: Self-pay | Admitting: Physician Assistant

## 2019-12-07 DIAGNOSIS — R7989 Other specified abnormal findings of blood chemistry: Secondary | ICD-10-CM

## 2019-12-07 DIAGNOSIS — K259 Gastric ulcer, unspecified as acute or chronic, without hemorrhage or perforation: Secondary | ICD-10-CM

## 2019-12-07 HISTORY — PX: HEMOSTASIS CLIP PLACEMENT: SHX6857

## 2019-12-07 HISTORY — PX: BIOPSY: SHX5522

## 2019-12-07 HISTORY — PX: HOT HEMOSTASIS: SHX5433

## 2019-12-07 HISTORY — PX: ESOPHAGOGASTRODUODENOSCOPY: SHX5428

## 2019-12-07 HISTORY — PX: SUBMUCOSAL INJECTION: SHX5543

## 2019-12-07 LAB — CULTURE, BLOOD (ROUTINE X 2): Special Requests: ADEQUATE

## 2019-12-07 LAB — COMPREHENSIVE METABOLIC PANEL
ALT: 16 U/L (ref 0–44)
AST: 20 U/L (ref 15–41)
Albumin: 2.1 g/dL — ABNORMAL LOW (ref 3.5–5.0)
Alkaline Phosphatase: 37 U/L — ABNORMAL LOW (ref 38–126)
Anion gap: 11 (ref 5–15)
BUN: 38 mg/dL — ABNORMAL HIGH (ref 8–23)
CO2: 23 mmol/L (ref 22–32)
Calcium: 8.9 mg/dL (ref 8.9–10.3)
Chloride: 101 mmol/L (ref 98–111)
Creatinine, Ser: 1.19 mg/dL (ref 0.61–1.24)
GFR calc Af Amer: >60 mL/min (ref >60)
GFR calc non Af Amer: 59 mL/min — ABNORMAL LOW (ref >60)
Glucose, Bld: 150 mg/dL — ABNORMAL HIGH (ref 70–99)
Potassium: 4 mmol/L (ref 3.5–5.1)
Sodium: 135 mmol/L (ref 135–145)
Total Bilirubin: 0.4 mg/dL (ref 0.3–1.2)
Total Protein: 5.8 g/dL — ABNORMAL LOW (ref 6.5–8.1)

## 2019-12-07 LAB — SARS CORONAVIRUS 2 BY RT PCR (HOSPITAL ORDER, PERFORMED IN ~~LOC~~ HOSPITAL LAB): SARS Coronavirus 2: POSITIVE — AB

## 2019-12-07 LAB — CBC WITH DIFFERENTIAL/PLATELET
Abs Immature Granulocytes: 0.04 10*3/uL (ref 0.00–0.07)
Basophils Absolute: 0 10*3/uL (ref 0.0–0.1)
Basophils Relative: 0 %
Eosinophils Absolute: 0 10*3/uL (ref 0.0–0.5)
Eosinophils Relative: 0 %
HCT: 24.3 % — ABNORMAL LOW (ref 39.0–52.0)
Hemoglobin: 7.9 g/dL — ABNORMAL LOW (ref 13.0–17.0)
Immature Granulocytes: 1 %
Lymphocytes Relative: 10 %
Lymphs Abs: 0.5 10*3/uL — ABNORMAL LOW (ref 0.7–4.0)
MCH: 30 pg (ref 26.0–34.0)
MCHC: 32.5 g/dL (ref 30.0–36.0)
MCV: 92.4 fL (ref 80.0–100.0)
Monocytes Absolute: 0.2 10*3/uL (ref 0.1–1.0)
Monocytes Relative: 4 %
Neutro Abs: 4.1 10*3/uL (ref 1.7–7.7)
Neutrophils Relative %: 85 %
Platelets: 190 10*3/uL (ref 150–400)
RBC: 2.63 MIL/uL — ABNORMAL LOW (ref 4.22–5.81)
RDW: 14.4 % (ref 11.5–15.5)
WBC: 4.8 10*3/uL (ref 4.0–10.5)
nRBC: 0 % (ref 0.0–0.2)

## 2019-12-07 LAB — GLUCOSE, CAPILLARY
Glucose-Capillary: 181 mg/dL — ABNORMAL HIGH (ref 70–99)
Glucose-Capillary: 205 mg/dL — ABNORMAL HIGH (ref 70–99)
Glucose-Capillary: 335 mg/dL — ABNORMAL HIGH (ref 70–99)
Glucose-Capillary: 284 mg/dL — ABNORMAL HIGH (ref 70–99)

## 2019-12-07 LAB — MAGNESIUM: Magnesium: 1.9 mg/dL (ref 1.7–2.4)

## 2019-12-07 LAB — HEMOGLOBIN AND HEMATOCRIT, BLOOD
HCT: 26.5 % — ABNORMAL LOW (ref 39.0–52.0)
Hemoglobin: 8.4 g/dL — ABNORMAL LOW (ref 13.0–17.0)

## 2019-12-07 LAB — C-REACTIVE PROTEIN: CRP: 3.2 mg/dL — ABNORMAL HIGH (ref <1.0)

## 2019-12-07 LAB — D-DIMER, QUANTITATIVE: D-Dimer, Quant: 2.55 ug/mL-FEU — ABNORMAL HIGH (ref 0.00–0.50)

## 2019-12-07 SURGERY — EGD (ESOPHAGOGASTRODUODENOSCOPY)
Anesthesia: General

## 2019-12-07 MED ORDER — SALINE SPRAY 0.65 % NA SOLN
1.0000 | NASAL | Status: DC | PRN
  Filled 2019-12-07: qty 44

## 2019-12-07 MED ORDER — PROPOFOL 10 MG/ML IV BOLUS
INTRAVENOUS | Status: DC | PRN
  Administered 2019-12-07: 25 mg via INTRAVENOUS

## 2019-12-07 MED ORDER — ALPRAZOLAM 0.25 MG PO TABS
0.2500 mg | ORAL_TABLET | Freq: Two times a day (BID) | ORAL | Status: DC | PRN
  Administered 2019-12-07 – 2019-12-09 (×3): 0.25 mg via ORAL
  Filled 2019-12-07 (×3): qty 1

## 2019-12-07 MED ORDER — PROPOFOL 500 MG/50ML IV EMUL
INTRAVENOUS | Status: DC | PRN
  Administered 2019-12-07: 200 ug/kg/min via INTRAVENOUS

## 2019-12-07 MED ORDER — NYSTATIN 100000 UNIT/ML MT SUSP
5.0000 mL | Freq: Four times a day (QID) | OROMUCOSAL | Status: DC
  Administered 2019-12-07 – 2019-12-08 (×7): 500000 [IU] via ORAL
  Filled 2019-12-07 (×7): qty 5

## 2019-12-07 MED ORDER — SODIUM CHLORIDE (PF) 0.9 % IJ SOLN
PREFILLED_SYRINGE | INTRAMUSCULAR | Status: DC | PRN
  Administered 2019-12-07: 1 mL

## 2019-12-07 NOTE — Progress Notes (Signed)
Lower extremity venous has been completed.   Preliminary results in CV Proc.   Abram Sander 12/07/2019 1:58 PM

## 2019-12-07 NOTE — Progress Notes (Signed)
Pt arrived to 2 [facility] room 28. Pt is A&O x 4. Pt has IV x 2 and is on 3L [place]. Pt oriented to room and caregivers. Call bell given to pt. Bed alarm on

## 2019-12-07 NOTE — Evaluation (Signed)
Physical Therapy Evaluation Patient Details Name: Cody Moss MRN: [id number] DOB: 12-15-1940 Today's Date: 12/07/2019   History of Present Illness  79 year old male with past medical history of gout, hypertension, diabetes mellitus type 2, abdominal aortic aneurysm status post repair, pulmonary fibrosis, coronary artery disease status post PCI/stent, gastroesophageal reflux disease, hyperlipidemia and recent diagnosis of COVID-19 on 7/25 who presents with complaints of cough, poor oral intake, generalized weakness and abdominal discomfort.  Clinical Impression  Pt admitted with/for Covid related symptoms.  Pt generally at a min assist level of basic mobility and gait with sats dropping into the mid 80's on 3L [place].  Pt currently limited functionally due to the problems listed below.  (see problems list.)  Pt will benefit from PT to maximize function and safety to be able to get home safely with available assist.     Follow Up Recommendations No PT follow up (vs HHpt is slower to improve)    Equipment Recommendations  None recommended by PT    Recommendations for Other Services       Precautions / Restrictions Precautions Precautions: Fall Precaution Comments: monitor O2      Mobility  Bed Mobility Overal bed mobility: Needs Assistance Bed Mobility: Supine to Sit     Supine to sit: Min assist;HOB elevated     General bed mobility comments: Min A for trunk advancement with HHA  Transfers Overall transfer level: Needs assistance Equipment used: None Transfers: Sit to/from Stand Sit to Stand: Min assist         General transfer comment: Min A for steadying and powering up without AD  Ambulation/Gait Ambulation/Gait assistance: Min guard Gait Distance (Feet): 190 Feet Assistive device: IV Pole;None Gait Pattern/deviations: Step-through pattern     General Gait Details: mildly unsteady, initially mildly staggery with improvement as distance progressed  Marine scientist [place] (Stroke Patients Only)       Balance Overall balance assessment: Needs assistance Sitting-balance support: Bilateral upper extremity supported;Feet supported Sitting balance-Leahy Scale: Good     Standing balance support: No upper extremity supported;During functional activity Standing balance-Leahy Scale: Fair Standing balance comment: static standing fair. However, unsteadiness and LOB noted when mobilizing                             Pertinent Vitals/Pain Pain Assessment: Faces Faces Pain Scale: No hurt Pain Intervention(s): Monitored during session    Home Living Family/patient expects to be discharged to:: Private residence Living Arrangements: Spouse/significant other;Other relatives (mother in law) Available Help at Discharge: Family Type of Home: House Home Access: Ramped entrance     Home Layout: One level Home Equipment: Shower seat - built in;Grab bars - toilet;Walker - 2 wheels;Cane - single point;Walker - 4 wheels      Prior Function Level of Independence: Independent         Comments: Independent with ADLs, IADLs and mobility without AD. 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 Pt was still driving and doing yard work     Journalist, newspaper   Dominant Hand: Right    Extremity/Trunk Assessment        Lower Extremity Assessment Lower Extremity Assessment: Overall WFL for tasks assessed (general proximal large muscle group weakness)    Cervical / Trunk Assessment Cervical / Trunk Assessment: Normal  Communication   Communication: No difficulties  Cognition  Arousal/Alertness: Awake/alert Behavior During Therapy: WFL for tasks assessed/performed Overall Cognitive Status: No family/caregiver present to determine baseline cognitive functioning                                 General Comments: Pt A&Ox4 with appropriate responses throughout.        General Comments General comments (skin integrity, edema, etc.): on 1L sats at 90% at rest  On 3L sats dropping to 84/85% and slowing recovering.    Exercises     Assessment/Plan    PT Assessment Patient needs continued PT services  PT Problem List Decreased mobility;Decreased activity tolerance;Cardiopulmonary status limiting activity;Decreased strength       PT Treatment Interventions Therapeutic activities;Therapeutic exercise;Patient/family education;Functional mobility training;Gait training    PT Goals (Current goals can be found in the Care Plan section)  Acute Rehab PT Goals Patient Stated Goal: go home PT Goal Formulation: With patient Time For Goal Achievement: 12/21/19 Potential to Achieve Goals: Good    Frequency Min 3X/week   Barriers to discharge        Co-evaluation               AM-PAC PT "6 Clicks" Mobility  Outcome Measure Help needed turning from your back to your side while in a flat bed without using bedrails?: A Little Help needed moving from lying on your back to sitting on the side of a flat bed without using bedrails?: A Little Help needed moving to and from a bed to a chair (including a wheelchair)?: A Little Help needed standing up from a chair using your arms (e.g., wheelchair or bedside chair)?: A Little Help needed to walk in hospital room?: A Little Help needed climbing 3-5 steps with a railing? : A Little 6 Click Score: 18    End of Session Equipment Utilized During Treatment: Oxygen Activity Tolerance: Patient tolerated treatment well Patient left: in bed;with call bell/phone within reach Nurse Communication: Mobility status PT Visit Diagnosis: Other abnormalities of gait and mobility (R26.89);Difficulty in walking, not elsewhere classified (R26.2)    Time: 9774-1423 (1658) PT Time Calculation (min) (ACUTE ONLY): 32 min   Charges:   PT Evaluation $PT Eval Moderate Complexity: 1 Mod PT Treatments $Gait Training: 8-22 mins         12/07/2019  Ginger Carne., PT Acute Rehabilitation Services [phone]  (pager) [phone]  (office)  Tessie Fass Lovina Zuver 12/07/2019, 6:23 PM

## 2019-12-07 NOTE — Anesthesia Postprocedure Evaluation (Signed)
Anesthesia Post Note  Patient: Cody Moss  Procedure(s) Performed: ESOPHAGOGASTRODUODENOSCOPY (EGD) (N/A ) BIOPSY SUBMUCOSAL INJECTION HOT HEMOSTASIS (ARGON PLASMA COAGULATION/BICAP) (N/A ) HEMOSTASIS CLIP PLACEMENT     Patient location during evaluation: PACU Anesthesia Type: MAC Level of consciousness: awake and alert Pain management: pain level controlled Vital Signs Assessment: post-procedure vital signs reviewed and stable Respiratory status: spontaneous breathing, nonlabored ventilation, respiratory function stable and patient connected to nasal cannula oxygen Cardiovascular status: stable and blood pressure returned to baseline Postop Assessment: no apparent nausea or vomiting Anesthetic complications: no   No complications documented.  Last Vitals:  Vitals:   12/07/19 1550 12/07/19 1609  BP: (!) 116/91   Pulse: 68   Resp: 19   Temp: 36.5 C   SpO2: 95% 94%    Last Pain:  Vitals:   12/07/19 1225  TempSrc:   PainSc: 0-No pain                 Bedie Dominey S

## 2019-12-07 NOTE — Interval H&P Note (Signed)
History and Physical Interval Note:  12/07/2019 10:26 AM  Cody Moss  has presented today for surgery, with the diagnosis of S/P ERCP with Sphincterotomy and now with FOBT+ Anemia & Melena.  The various methods of treatment have been discussed with the patient and family. After consideration of risks, benefits and other options for treatment, the patient has consented to  Procedure(s): ESOPHAGOGASTRODUODENOSCOPY (EGD) (N/A) ENDOSCOPIC RETROGRADE CHOLANGIOPANCREATOGRAPHY (ERCP) (N/A) as a surgical intervention.  The patient's history has been reviewed, patient examined, no change in status, stable for surgery.  I have reviewed the patient's chart and labs.  Questions were answered to the patient's satisfaction.     [employer]

## 2019-12-07 NOTE — Anesthesia Procedure Notes (Signed)
Procedure Name: MAC Date/Time: 12/07/2019 11:17 AM Performed by: Imagene Riches, CRNA Pre-anesthesia Checklist: Patient identified, Emergency Drugs available, Suction available, Patient being monitored and Timeout performed Patient Re-evaluated:Patient Re-evaluated prior to induction Oxygen Delivery Method: Nasal cannula

## 2019-12-07 NOTE — Anesthesia Preprocedure Evaluation (Signed)
Anesthesia Evaluation  Patient identified by MRN, date of birth, ID band Patient awake    Reviewed: Allergy & Precautions, NPO status , Patient's Chart, lab work & pertinent test results  Airway Mallampati: II  TM Distance: >3 FB Neck ROM: Full    Dental no notable dental hx.    Pulmonary neg pulmonary ROS, Patient abstained from smoking., former smoker,    Pulmonary exam normal breath sounds clear to auscultation       Cardiovascular hypertension, + CAD and + Peripheral Vascular Disease  Normal cardiovascular exam Rhythm:Regular Rate:Normal     Neuro/Psych negative neurological ROS  negative psych ROS   GI/Hepatic negative GI ROS, Neg liver ROS,   Endo/Other  diabetes  Renal/GU negative Renal ROS  negative genitourinary   Musculoskeletal negative musculoskeletal ROS (+)   Abdominal   Peds negative pediatric ROS (+)  Hematology  (+) anemia ,   Anesthesia Other Findings   Reproductive/Obstetrics negative OB ROS                             Anesthesia Physical Anesthesia Plan  ASA: III  Anesthesia Plan: General   Post-op Pain Management:    Induction: Intravenous  PONV Risk Score and Plan: 2 and Ondansetron, Dexamethasone and Treatment may vary due to age or medical condition  Airway Management Planned: Oral ETT  Additional Equipment:   Intra-op Plan:   Post-operative Plan: Extubation in OR  Informed Consent: I have reviewed the patients History and Physical, chart, labs and discussed the procedure including the risks, benefits and alternatives for the proposed anesthesia with the patient or authorized representative who has indicated his/her understanding and acceptance.     Dental advisory given  Plan Discussed with: CRNA and Surgeon  Anesthesia Plan Comments:         Anesthesia Quick Evaluation

## 2019-12-07 NOTE — Progress Notes (Signed)
PROGRESS NOTE                                                                                                                                                                                                             Patient Demographics:    Cody Moss, is a 79 y.o. male, DOB - 12-30-1940, HFW:[id number]  Admit date - 12/04/2019   Admitting Physician Vernelle Emerald, MD  Outpatient Primary MD for the patient is [name], MD  LOS - 3   Chief Complaint  Patient presents with  . Shortness of Breath       Brief Narrative   79 year old male with past medical history of gout, hypertension, diabetes mellitus type 2, abdominal aortic aneurysm status post repair, pulmonary fibrosis, coronary artery disease status post PCI/stent, gastroesophageal reflux disease, hyperlipidemia and recent diagnosis of COVID-19 on 7/25 who presents with complaints of cough, poor oral intake, generalized weakness and abdominal discomfort.  Of note, patient was recently admitted to [facility] from 7/25-7/30.  Patient was admitted for COVID-19 infection (Dx 7/25) but during the hospitalization was also found to have a cavitary lesion of the right lower lobe of the lungs.  Patient was also found to have E. coli bacteremia and concurrently found to have evidence of choledocholithiasis and suspected cholangitis.  Patient was treated with a course of intravenous Zosyn and azithromycin.  Patient underwent ERCP and sphincterotomy with stone extraction by Dr. Henrene Pastor.  Patient was eventually discharged on 7/30 on Augmentin and azithromycin with eventual plan for the patient to follow-up with Dr.  Melvyn Novas with pulmonology.    Since the patient's discharge on 7/25 the patient has continued to experience productive cough, severe in intensity with white to yellow sputum.  Patient initially denied associated shortness of breath but upon further questioning patient eventually  stated yes.  Patient is unable to provide further descriptors concerning this however.  Patient has also continued to experience generalized weakness and poor oral intake.  As patient clinically worsened over the next several weeks patient began to develop dull abdominal pain, radiating from the bilateral flanks to the mid abdomen.  This pain is severe in intensity, worse with deep inspiration and movement and improved with rest.  Of note, the patient states that he did complete the remainder of his course of azithromycin and Augmentin.  Because of patient's progressively worsening generalized weakness poor intake, abdominal pain cough and shortness of breath patient initially presented to [facility] emergency department for evaluation.  Upon evaluation in the emergency department CT imaging the chest revealed significant interval progression of the multifocal groundglass pulmonary infiltrates and peripheral areas of consolidation concerning for progressive atypical infection/likely COVID-19 pneumonia.  Clinically, patient was felt to be hypovolemic and also found to have a lactic acidosis initially of 2.2 which rose to 3.6 during his stay in the emergency department.  Patient was placed back on intravenous antibiotic therapy with cefepime and vancomycin.  Patient was provided with 1 L of isotonic IV fluids.  The hospitalist group was then called to assess the patient for admission to the hospital.    Subjective:    [name] today reports dyspnea on cough has improved, no abdominal pain, had dark bowel movement overnight but no melena.  .   Assessment  & Plan :    Principal Problem:   Acute respiratory failure with hypoxia (HCC) Active Problems:   Coronary artery disease involving native coronary artery of native heart without angina pectoris   Gout   Essential hypertension   Cavitating mass in right lower lung lobe   Abdominal pain   Pneumonia due to COVID-19 virus    Uncontrolled type 2 diabetes mellitus with hyperglycemia, without long-term current use of insulin (HCC)   GERD without esophagitis   Mixed diabetic hyperlipidemia associated with type 2 diabetes mellitus (HCC)   Dehydration   Hyperkalemia   Lactic acidosis   Lumbar back pain  Acute respiratory failure with hypoxia -This is most likely in the setting of recent COVID-19 pneumonia, with possible residual pneumonia with inflammation. -For now we will continue with isolation, continue with IV steroids, no indication for IV remdesivir at this point. -Procalcitonin is low at 0.14, so we will hold on IV antibiotics. -Continue with bronchodilators -He is with improved oxygen requirement, he is on 2 L nasal cannula today.  Upper GI bleed -Presents with melena, Hemoccult positive, hemoglobin with a drop . 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 -As discussed with GI, recommendation for clear liquid diet advanced by tomorrow, monitor H&H closely and transfuse as needed, hold aspirin and Lovenox. -Continue with Protonix drip -Noted on statin swish and swallow  Cavitating mass in right lower lung lobe -Following with pulmonary Dr. Morrison Old, and recent hospitalization and elevated procalcitonin he was treated with antibiotics, appears with no improvement in the mass despite normalization of procalcitonin, patient possibility of malignancy, patient need to follow with pulmonary as an outpatient to have PET /CT scan done, this has been discussed with patient and daughter, they understand the importance of the follow-up.  Moderate left UPJ obstruction - discussed with urology Dr Alinda Money, he is following with Dr. Diona Fanti as outpatient for history of prostate cancer, Dr. Alinda Money reviewed imaging,  actually no significant changes from recent scan 7/25, no significant hydronephrosis, mainly significant for chronic cyst.  At this point no recommendation for further work-up unless he has worsening renal function the setting of obstruction then he will need repeat imaging.  Pneumonia due to COVID-19 virus -Please see above discussion -Patient is> 21 of initial positive COVID-19 test, discussed with infectious disease Dr. Caren Griffins, given his CT value is 36.9(>32), we can go ahead and DC his  isolation  Elevated D-dimers -CTA chest with no evidence of PE, venous Doppler negative for DVT as well, for now DVT prophylaxis has been held in the setting of GI bleed . -SCD for DVT prophylaxis, .   Coronary artery disease involving native coronary artery of native heart without angina pectoris/history of PAD - Patient is currently chest pain-free, troponin unremarkable, EKG reveals no evidence of dynamic change. -holding  aspirin in the setting of   GI bleed  Essential hypertension - Continue home regimen of antihypertensive therapy.  I have stopped Avapro given hyperkalemia, will add as needed hydralazine, but overall blood pressure is controlled  Uncontrolled type 2 diabetes mellitus with hyperglycemia, without long-term current use of insulin (HCC) - Accu-Cheks before every meal and nightly with sliding scale insulin - Holding home regimen of oral hypoglycemics.  Dehydration - continue with IV fluids  Hyperkalemia -Resolved, continue to hold Avapro   COVID-19 Labs  Recent Labs    12/05/19 0602 12/06/19 0247 12/07/19 0103  DDIMER 3.85* 3.87* 2.55*  CRP 13.3* 7.5* 3.2*    Lab Results  Component Value Date   SARSCOV2NAA POSITIVE (A) 12/07/2019   SARSCOV2NAA POSITIVE (A) 11/15/2019   SARSCOV2NAA Not Detected 07/17/2019   [place] NEGATIVE 05/01/2019     Code Status : DNR  Family Communication  : D/W daughter by phone 8/15, 8/16  Disposition Plan  :  Status is:  Inpatient  Remains inpatient appropriate because:Hemodynamically unstable and IV treatments appropriate due to intensity of illness or inability to take PO   Dispo: The patient is from: Home              Anticipated d/c is to: Home              Anticipated d/c date is: 3 days              Patient currently is not medically stable to d/c.       Consults  :  None  Procedures  : None  DVT Prophylaxis  :  [place] lovenox  Lab Results  Component Value Date   PLT 190 12/07/2019    Antibiotics  :    Anti-infectives (From admission, onward)   Start     Dose/Rate Route Frequency Ordered Stop   12/04/19 2100  ceFEPIme (MAXIPIME) 2 g in sodium chloride 0.9 % 100 mL IVPB        2 g 200 mL/hr over 30 Minutes Intravenous  Once 12/04/19 2050 12/04/19 2158   12/04/19 2100  vancomycin (VANCOREADY) IVPB 1500 mg/300 mL        1,500 mg 150 mL/hr over 120 Minutes Intravenous  Once 12/04/19 2053 12/05/19 0005   12/04/19 2045  vancomycin (VANCOCIN) IVPB 1000 mg/200 mL premix  Status:  Discontinued        1,000 mg 200 mL/hr over 60 Minutes Intravenous  Once 12/04/19 2031 12/04/19 2053   12/04/19 2045  piperacillin-tazobactam (ZOSYN) IVPB 2.25 g  Status:  Discontinued        2.25 g 100 mL/hr over 30 Minutes Intravenous  Once 12/04/19 2031 12/04/19 2053        Objective:   Vitals:   12/07/19 1205 12/07/19 1218 12/07/19 1225 12/07/19 1357  BP: 125/89 (!) 143/73 136/81   Pulse:      Resp: 19 20 18    Temp:      TempSrc:      SpO2: 100% 100% 97% 92%  Weight:      Height:  Wt Readings from Last 3 Encounters:  12/07/19 78 kg  11/18/19 85.2 kg  08/12/19 87.5 kg     Intake/Output Summary (Last 24 hours) at 12/07/2019 1530 Last data filed at 12/07/2019 1007 Gross per 24 hour  Intake 1355.29 ml  Output 800 ml  Net 555.29 ml     Physical Exam  Awake Alert, Oriented X 3, No new F.N deficits, Normal affect Symmetrical Chest wall movement, Good air movement bilaterally,  CTAB RRR,No Gallops,Rubs or new Murmurs, No Parasternal Heave +ve B.Sounds, Abd Soft, No tenderness, No rebound - guarding or rigidity. No Cyanosis, Clubbing or edema, No new Rash or bruise        Data Review:    CBC Recent Labs  Lab 12/04/19 1754 12/04/19 2311 12/05/19 0606 12/06/19 0247 12/06/19 1659 12/07/19 0103 12/07/19 1439  WBC 6.4  --  4.8 4.9 6.1 4.8  --   HGB 10.3*   < > 9.0* 8.6* 9.1* 7.9* 8.4*  HCT 32.8*   < > 29.2* 27.5* 28.6* 24.3* 26.5*  PLT 130*  --  150 224 158 190  --   MCV 96.5  --  98.6 97.5 96.0 92.4  --   MCH 30.3  --  30.4 30.5 30.5 30.0  --   MCHC 31.4  --  30.8 31.3 31.8 32.5  --   RDW 14.6  --  14.8 14.6 14.5 14.4  --   LYMPHSABS 0.7  --  0.5* 0.5*  --  0.5*  --   MONOABS 0.4  --  0.2 0.3  --  0.2  --   EOSABS 0.0  --  0.0 0.0  --  0.0  --   BASOSABS 0.0  --  0.0 0.0  --  0.0  --    < > = values in this interval not displayed.    Chemistries  Recent Labs  Lab 12/04/19 1754 12/04/19 1754 12/04/19 2311 12/05/19 0606 12/06/19 0247 12/06/19 1659 12/07/19 0103  NA 135  --  137 134* 134*  --  135  K 5.3*   < > 4.6 5.1 5.5* 4.2 4.0  CL 99  --   --  101 102  --  101  CO2 19*  --   --  21* 22  --  23  GLUCOSE 193*  --   --  147* 128*  --  150*  BUN 76*  --   --  64* 50*  --  38*  CREATININE 1.73*  --   --  1.48* 1.30*  --  1.19  CALCIUM 9.9  --   --  9.0 9.4  --  8.9  MG  --   --   --  2.5* 2.1  --  1.9  AST 35  --   --  42* 23  --  20  ALT 21  --   --  20 18  --  16  ALKPHOS 48  --   --  43 39  --  37*  BILITOT 1.1  --   --  0.6 0.4  --  0.4   < > = values in this interval not displayed.   ------------------------------------------------------------------------------------------------------------------ No results for input(s): CHOL, HDL, LDLCALC, TRIG, CHOLHDL, LDLDIRECT in the last 72 hours.  Lab Results  Component Value Date   HGBA1C 7.5 (H) 11/15/2019    ------------------------------------------------------------------------------------------------------------------ No results for input(s): TSH, T4TOTAL, T3FREE, THYROIDAB in the last 72 hours.  Invalid input(s): FREET3 ------------------------------------------------------------------------------------------------------------------ No results for input(s): VITAMINB12, FOLATE, FERRITIN,  TIBC, IRON, RETICCTPCT in the last 72 hours.  Coagulation profile No results for input(s): INR, PROTIME in the last 168 hours.  Recent Labs    12/06/19 0247 12/07/19 0103  DDIMER 3.87* 2.55*    Cardiac Enzymes No results for input(s): CKMB, TROPONINI, MYOGLOBIN in the last 168 hours.  Invalid input(s): CK ------------------------------------------------------------------------------------------------------------------    Component Value Date/Time   BNP 64.6 12/05/2019 0602    Inpatient Medications  Scheduled Meds: . albuterol  2 puff Inhalation Q6H  . allopurinol  300 mg Oral Daily  . bicalutamide  50 mg Oral QHS  . feeding supplement (ENSURE ENLIVE)  237 mL Oral TID BM  . fenofibrate  160 mg Oral QPM  . finasteride  5 mg Oral QHS  . insulin aspart  0-15 Units Subcutaneous TID AC & HS  . methylPREDNISolone (SOLU-MEDROL) injection  40 mg Intravenous Q8H  . nystatin  5 mL Oral QID  . [START ON 12/10/2019] pantoprazole  40 mg Intravenous Q12H  . pneumococcal 23 valent vaccine  0.5 mL Intramuscular Tomorrow-1000  . saccharomyces boulardii  250 mg Oral BID   Continuous Infusions: . pantoprozole (PROTONIX) infusion 8 mg/hr (12/07/19 1319)   PRN Meds:.acetaminophen, ALPRAZolam, colchicine, hydrALAZINE, ondansetron **OR** ondansetron (ZOFRAN) IV, oxyCODONE-acetaminophen, polyethylene glycol, sodium chloride  Micro Results Recent Results (from the past 240 hour(s))  Blood culture (routine x 2)     Status: Abnormal   Collection Time: 12/04/19  5:54 PM   Specimen: BLOOD  Result Value Ref  Range Status   Specimen Description BLOOD RIGHT ANTECUBITAL  Final   Special Requests   Final    BOTTLES DRAWN AEROBIC AND ANAEROBIC Blood Culture adequate volume   Culture  Setup Time   Final    GRAM POSITIVE COCCI IN CLUSTERS ANAEROBIC BOTTLE ONLY CRITICAL RESULT CALLED TO, READ BACK BY AND VERIFIED WITH: J EAKINS,PHARMD AT 2056 12/05/19 BY L BENFIELD    Culture (A)  Final    STAPHYLOCOCCUS SPECIES (COAGULASE NEGATIVE) THE SIGNIFICANCE OF ISOLATING THIS ORGANISM FROM A SINGLE SET OF BLOOD CULTURES WHEN MULTIPLE SETS ARE DRAWN IS UNCERTAIN. PLEASE NOTIFY THE MICROBIOLOGY DEPARTMENT WITHIN ONE WEEK IF SPECIATION AND SENSITIVITIES ARE REQUIRED. Performed at [facility], [place] [address], [place], [place] [address]    Report Status 12/07/2019 FINAL  Final  Blood Culture ID Panel (Reflexed)     Status: Abnormal   Collection Time: 12/04/19  5:54 PM  Result Value Ref Range Status   Enterococcus faecalis NOT DETECTED NOT DETECTED Final   Enterococcus Faecium NOT DETECTED NOT DETECTED Final   Listeria monocytogenes NOT DETECTED NOT DETECTED Final   Staphylococcus species DETECTED (A) NOT DETECTED Final    Comment: CRITICAL RESULT CALLED TO, READ BACK BY AND VERIFIED WITH: J EAKINS,PHARMD AT 2056 12/05/19 BY L BENFIELD    Staphylococcus aureus (BCID) NOT DETECTED NOT DETECTED Final   Staphylococcus epidermidis DETECTED (A) NOT DETECTED Final    Comment: Methicillin (oxacillin) resistant coagulase negative staphylococcus. Possible blood culture contaminant (unless isolated from more than one blood culture draw or clinical case suggests pathogenicity). No antibiotic treatment is indicated for blood  culture contaminants. CRITICAL RESULT CALLED TO, READ BACK BY AND VERIFIED WITH: J EAKINS,PHARMD AT 2056 12/05/19 BY L BENFIELD    Staphylococcus lugdunensis NOT DETECTED NOT DETECTED Final   Streptococcus species NOT DETECTED NOT DETECTED Final   Streptococcus agalactiae NOT DETECTED NOT  DETECTED Final   Streptococcus pneumoniae NOT DETECTED NOT DETECTED Final   Streptococcus pyogenes NOT DETECTED  NOT DETECTED Final   A.calcoaceticus-baumannii NOT DETECTED NOT DETECTED Final   Bacteroides fragilis NOT DETECTED NOT DETECTED Final   Enterobacterales NOT DETECTED NOT DETECTED Final   Enterobacter cloacae complex NOT DETECTED NOT DETECTED Final   Escherichia coli NOT DETECTED NOT DETECTED Final   Klebsiella aerogenes NOT DETECTED NOT DETECTED Final   Klebsiella oxytoca NOT DETECTED NOT DETECTED Final   Klebsiella pneumoniae NOT DETECTED NOT DETECTED Final   Proteus species NOT DETECTED NOT DETECTED Final   Salmonella species NOT DETECTED NOT DETECTED Final   Serratia marcescens NOT DETECTED NOT DETECTED Final   Haemophilus influenzae NOT DETECTED NOT DETECTED Final   Neisseria meningitidis NOT DETECTED NOT DETECTED Final   Pseudomonas aeruginosa NOT DETECTED NOT DETECTED Final   Stenotrophomonas maltophilia NOT DETECTED NOT DETECTED Final   Candida albicans NOT DETECTED NOT DETECTED Final   Candida auris NOT DETECTED NOT DETECTED Final   Candida glabrata NOT DETECTED NOT DETECTED Final   Candida krusei NOT DETECTED NOT DETECTED Final   Candida parapsilosis NOT DETECTED NOT DETECTED Final   Candida tropicalis NOT DETECTED NOT DETECTED Final   Cryptococcus neoformans/gattii NOT DETECTED NOT DETECTED Final   Methicillin resistance mecA/C DETECTED (A) NOT DETECTED Final    Comment: CRITICAL RESULT CALLED TO, READ BACK BY AND VERIFIED WITH: J EAKINS,PHARMD AT 2056 12/05/19 BY L BENFIELD Performed at [facility], [address], [place], [place] [address]   Blood culture (routine x 2)     Status: None (Preliminary result)   Collection Time: 12/04/19  6:05 PM   Specimen: BLOOD RIGHT HAND  Result Value Ref Range Status   Specimen Description BLOOD RIGHT HAND  Final   Special Requests   Final    BOTTLES DRAWN AEROBIC AND ANAEROBIC Blood Culture adequate volume   Culture    Final    NO GROWTH 3 DAYS Performed at [facility], [place] [address] [place], [place] [address]    Report Status PENDING  Incomplete  SARS Coronavirus 2 by RT PCR (hospital order, performed in [facility] hospital lab) Nasopharyngeal Nasopharyngeal Swab     Status: Abnormal   Collection Time: 12/07/19  9:27 AM   Specimen: Nasopharyngeal Swab  Result Value Ref Range Status   SARS Coronavirus 2 POSITIVE (A) NEGATIVE Final    Comment: RESULT CALLED TO, READ BACK BY AND VERIFIED WITH: RN Ashley Akin [id number] MLM (NOTE) SARS-CoV-2 target nucleic acids are DETECTED  SARS-CoV-2 RNA is generally detectable in upper respiratory specimens  during the acute phase of infection.  Positive results are indicative  of the presence of the identified virus, but do not rule out bacterial infection or co-infection with other pathogens not detected by the test.  Clinical correlation with patient history and  other diagnostic information is necessary to determine patient infection status.  The expected result is negative.  Fact Sheet for Patients:   StrictlyIdeas.no   Fact Sheet for Healthcare Providers:   BankingDealers.co.za    This test is not yet approved or cleared by the [place] FDA and  has been authorized for detection and/or diagnosis of SARS-CoV-2 by FDA under an Emergency Use Authorization (EUA).  This EUA will remain in effect (meaning this test can  be used) for the duration of  the COVID-19 declaration under Section 564(b)(1) of the Act, 21 U.S.C. section 360-bbb-3(b)(1), unless the authorization is terminated or revoked sooner.  Performed at [facility], [place] [address], [place],  [address]  Radiology Reports DG Lumbar Spine 2-3 Views  Result Date: 12/04/2019 CLINICAL DATA:  Lumbosacral back pain. EXAM: LUMBAR SPINE - 2-3 VIEW COMPARISON:  Reformats from abdominal CT earlier today. FINDINGS: Normal  alignment. Vertebral body heights are preserved. Significant disc space narrowing at L4-L5 and L5-S1. Facet hypertrophy at these levels. Multilevel endplate spurring of the upper lumbar spine with preservation of disc spaces. No fracture or focal lesion. Retained excreted IV contrast within dilated left renal collecting system as seen on CT earlier today. There is excreted IV contrast in the urinary bladder. IMPRESSION: 1. 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 No acute osseous abnormality. 2. Retained excreted IV contrast within dilated left renal collecting system as seen on CT earlier today. Electronically Signed   By: Keith Rake M.D.   On: 12/04/2019 23:48   CT Chest W Contrast  Result Date: 11/15/2019 CLINICAL DATA:  79 year old male with pneumonia. Concern for abscess or effusion. EXAM: CT CHEST, ABDOMEN, AND PELVIS WITH CONTRAST TECHNIQUE: Multidetector CT imaging of the chest, abdomen and pelvis was performed following the standard protocol during bolus administration of intravenous contrast. CONTRAST:  115mL OMNIPAQUE IOHEXOL 300 MG/ML  SOLN COMPARISON:  Chest CT dated 02/14/2018. Chest radiograph dated 11/15/2019. FINDINGS: CT CHEST FINDINGS Cardiovascular: There is no cardiomegaly or pericardial effusion. Advanced 3 vessel coronary vascular calcification. There is advanced calcified and noncalcified plaque of the thoracic aorta. No aneurysmal dilatation or dissection. The central pulmonary arteries appear patent. Mediastinum/Nodes: Right hilar adenopathy measuring 13 mm. Subcarinal lymph node measures 10 mm in short axis. The esophagus and the thyroid gland are grossly unremarkable. No mediastinal fluid collection. Lungs/Pleura: There is background of emphysema. Bibasilar subpleural reticulation as seen on the prior CT in keeping with interstitial lung disease. Overall slight progression of the fibrotic changes since the prior CT. There is a 2.6 x 2.7 cm  cavitary nodule in the superior segment of the right lower lobe which may represent an abscess, an infected pneumatocele. Fungal infection, TB or cavitary neoplasm are not excluded. Clinical correlation and follow-up to resolution is recommended. Several scattered bilateral lower lobe nodular densities, present on the prior CT. No lobar consolidation, pleural effusion, or pneumothorax. The central airways are patent. Musculoskeletal: No chest wall mass or suspicious bone lesions identified. CT ABDOMEN PELVIS FINDINGS No intra-abdominal free air or free fluid. Hepatobiliary: There is fatty infiltration of the liver. Slight irregularity of the liver contour may represent early changes of cirrhosis. Elastography may provide better evaluation. There is tumefactive sludge versus noncalcified stones within the gallbladder. There is diffuse gallbladder wall thickening and small pericholecystic fluid. Findings may be related to underlying liver disease although acute cholecystitis is not excluded. Further evaluation with right upper quadrant ultrasound recommended. Pancreas: Unremarkable. No pancreatic ductal dilatation or surrounding inflammatory changes. Spleen: Normal in size without focal abnormality. Adrenals/Urinary Tract: The adrenal glands are unremarkable. Multiple bilateral renal cysts with the largest a lobulated appearing left renal interpolar cyst measuring up to 13 cm in length. Several additional subcentimeter hypodensities are too small to characterize. There is no hydronephrosis on either side. There is symmetric enhancement and excretion of contrast by both kidneys. Mild bilateral perinephric stranding, nonspecific. Correlation with urinalysis recommended to exclude UTI. The visualized ureters and urinary bladder appear unremarkable. Stomach/Bowel: There is severe sigmoid diverticulosis without active inflammatory changes. There is no bowel obstruction or active inflammation. The appendix is normal.  Vascular/Lymphatic: There is advanced aortoiliac atherosclerotic disease. Status post prior distal abdominal aorta  aneurysm repair with an aorto bi iliac bypass graft. The graft appears patent. There is advanced atherosclerotic calcification of the common femoral arteries bilaterally, right greater left. The IVC is unremarkable. No portal venous gas. There is no adenopathy. Reproductive: Prostatectomy. 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 Musculoskeletal: There is degenerative changes of the spine. No acute osseous pathology. IMPRESSION: 1. A 2.6 x 2.7 cm cavitary nodule in the superior segment of the right lower lobe may represent an abscess, an infected pneumatocele. Fungal infection, TB or cavitary neoplasm are not excluded. Clinical correlation and follow-up to resolution is recommended. 2. Emphysema with slight progression of the interstitial lung disease since the prior CT. 3. Fatty liver with possible early changes of cirrhosis. Elastography may provide better evaluation. 4. Gallbladder tumefactive sludge versus noncalcified stones. Diffuse gallbladder wall thickening and small pericholecystic fluid may be related to underlying liver disease although acute cholecystitis is not excluded. Further evaluation with right upper quadrant ultrasound recommended. 5. Severe sigmoid diverticulosis. No bowel obstruction. Normal appendix. 6. Aortic Atherosclerosis (ICD10-I70.0) and Emphysema (ICD10-J43.9). Electronically Signed   By: Anner Crete M.D.   On: 11/15/2019 19:11   CT Angio Chest PE W and/or Wo Contrast  Result Date: 12/04/2019 CLINICAL DATA:  COVID pneumonia, dyspnea, productive cough, unspecified abdominal pain EXAM: CT ANGIOGRAPHY CHEST CT ABDOMEN AND PELVIS WITH CONTRAST TECHNIQUE: Multidetector CT imaging of the chest was performed using the standard protocol during bolus administration of intravenous contrast. Multiplanar CT image reconstructions and MIPs were  obtained to evaluate the vascular anatomy. Multidetector CT imaging of the abdomen and pelvis was performed using the standard protocol during bolus administration of intravenous contrast. CONTRAST:  68mL OMNIPAQUE IOHEXOL 350 MG/ML SOLN COMPARISON:  11/15/2019 FINDINGS: CTA CHEST FINDINGS Cardiovascular: There is excellent opacification of the pulmonary arterial tree. There is no intraluminal filling defect to suggest acute pulmonary embolism. There is borderline enlargement of the central pulmonary arteries suggesting mild pulmonary arterial hypertension. There is extensive multi-vessel coronary artery calcification noted. Global cardiac size is within normal limits. Left ventricular hypertrophy is noted. There is mild calcification of the aortic valve leaflets noted. The thoracic aorta demonstrates mild calcification within the aortic arch. Thoracic aorta is of normal caliber. Mediastinum/Nodes: Multiple pathologically enlarged right hilar lymph nodes are identifiedl measuring up to 19 mm in short axis diameter. Lungs/Pleura: Since the prior examination, multifocal ground-glass pulmonary infiltrates and peripheral areas of consolidation have significantly progressed in keeping with progressive atypical infection and compatible with progressive changes of COVID-19 pneumonia. There Isq, however, a focal cavitary nodule identified within the superior segment of the right lower lobe measuring 2.5 x 2.5 cm, similar to that noted on prior examination, with associated pathologic right hilar adenopathy. Differential considerations are unchanged, though malignancy is strongly considered given the associated pathologic right hilar adenopathy. No pneumothorax or pleural effusion. No central obstructing mass. Bronchial wall thickening is again identified in keeping with airway inflammation. Musculoskeletal: No chest wall abnormality. No acute or significant osseous findings. Review of the MIP images confirms the above  findings. CT ABDOMEN and PELVIS FINDINGS Hepatobiliary: Layering high density material within the gallbladder lumen is in keeping with inspissated sludge or tiny gallstones. There is pneumobilia now identified within the biliary tree suggesting changes of interval sphincterotomy. No intra or extrahepatic biliary ductal dilation. The liver is unremarkable. Pancreas: Unremarkable Spleen: Unremarkable Adrenals/Urinary Tract: The adrenal glands are unremarkable. The kidneys are normal in size and position. There is mild left hydronephrosis to  the level of the ureteropelvic junction with decompressive the ureter thereafter in keeping with a moderate left UPJ obstruction. Superimposed parapelvic and cortical simple cysts are identified. There is symmetric enhancement of the left kidney however. Multiple simple cortical cysts are seen within the right kidney. No hydronephrosis on the right. No renal or ureteral calculi. The bladder is unremarkable. Stomach/Bowel: Moderate sigmoid diverticulosis is identified without superimposed inflammatory change. Scattered diverticula are seen within the ascending colon. Stomach, small bowel, and large bowel are otherwise unremarkable. Appendix normal. No free intraperitoneal gas or fluid Vascular/Lymphatic: Moderate soft mural plaque is identified within the distal descending thoracic aorta at the diaphragmatic hiatus. 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 Extensive atherosclerotic calcification is seen within the terminal lower extremity arterial inflow and visualized outflow. There is no pathologic adenopathy within the abdomen and pelvis. Reproductive: Status post prostatectomy. Other: Rectum unremarkable. Musculoskeletal: No lytic or blastic bone lesions within the abdomen. Review of the MIP images confirms the above findings. IMPRESSION: 1. No evidence of acute pulmonary embolism. 2. Significant interval progression of multifocal ground-glass pulmonary infiltrates and  peripheral areas of consolidation in keeping with progressive atypical infection and compatible with COVID-19 pneumonia. 3. Persistent cavitary nodule within the superior segment of the right lower lobe, with associated pathologic right hilar adenopathy. Differential considerations are unchanged, though malignancy is strongly considered given the associated pathologic right hilar adenopathy. Short-term follow-up imaging or PET-CT examination may be helpful once the patient's acute issues have resolved for further evaluation. 4. Interval probable sphincterotomy with pneumobilia. Cholelithiasis persists though superimposed pericholecystic inflammatory change has resolved. 5. Moderate left UPJ obstruction. 6. Diverticulosis without evidence of acute diverticulitis. Electronically Signed   By: Fidela Salisbury MD   On: 12/04/2019 20:02   NM Hepatobiliary Liver Func  Result Date: 11/17/2019 CLINICAL DATA:  Nausea and vomiting. Cholelithiasis and choledocholithiasis. EXAM: NUCLEAR MEDICINE HEPATOBILIARY IMAGING TECHNIQUE: Sequential images of the abdomen were obtained out to 60 minutes following intravenous administration of radiopharmaceutical. RADIOPHARMACEUTICALS:  5.5 mCi Tc-22m  Choletec IV COMPARISON:  None. FINDINGS: Prompt uptake and biliary excretion of activity by the liver is seen. Gallbladder activity is visualized, consistent with patency of cystic duct. Biliary activity passes into small bowel, consistent with patent common bile duct. Reflux of biliary activity into the stomach is noted. IMPRESSION: Patency of both cystic and common bile ducts is demonstrated. Bile reflux noted. Electronically Signed   By: Marlaine Hind M.D.   On: 11/17/2019 15:15   CT ABDOMEN PELVIS W CONTRAST  Result Date: 12/04/2019 CLINICAL DATA:  COVID pneumonia, dyspnea, productive cough, unspecified abdominal pain EXAM: CT ANGIOGRAPHY CHEST CT ABDOMEN AND PELVIS WITH CONTRAST TECHNIQUE: Multidetector CT imaging of the chest was  performed using the standard protocol during bolus administration of intravenous contrast. Multiplanar CT image reconstructions and MIPs were obtained to evaluate the vascular anatomy. Multidetector CT imaging of the abdomen and pelvis was performed using the standard protocol during bolus administration of intravenous contrast. CONTRAST:  13mL OMNIPAQUE IOHEXOL 350 MG/ML SOLN COMPARISON:  11/15/2019 FINDINGS: CTA CHEST FINDINGS Cardiovascular: There is excellent opacification of the pulmonary arterial tree. There is no intraluminal filling defect to suggest acute pulmonary embolism. There is borderline enlargement of the central pulmonary arteries suggesting mild pulmonary arterial hypertension. There is extensive multi-vessel coronary artery calcification noted. Global cardiac size is within normal limits. Left ventricular hypertrophy is noted. There is mild calcification of the aortic valve leaflets noted. The thoracic aorta demonstrates mild calcification within the aortic arch. Thoracic aorta  is of normal caliber. Mediastinum/Nodes: Multiple pathologically enlarged right hilar lymph nodes are identifiedl measuring up to 19 mm in short axis diameter. Lungs/Pleura: Since the prior examination, multifocal ground-glass pulmonary infiltrates and peripheral areas of consolidation have significantly progressed in keeping with progressive atypical infection and compatible with progressive changes of COVID-19 pneumonia. There Isq, however, a focal cavitary nodule identified within the superior segment of the right lower lobe measuring 2.5 x 2.5 cm, similar to that noted on prior examination, with associated pathologic right hilar adenopathy. Differential considerations are unchanged, though malignancy is strongly considered given the associated pathologic right hilar adenopathy. No pneumothorax or pleural effusion. No central obstructing mass. Bronchial wall thickening is again identified in keeping with airway  inflammation. Musculoskeletal: No chest wall abnormality. No acute or significant osseous findings. Review of the MIP images confirms the above findings. CT ABDOMEN and PELVIS FINDINGS Hepatobiliary: Layering high density material within the gallbladder lumen is in keeping with inspissated sludge or tiny gallstones. There is pneumobilia now identified within the biliary tree suggesting changes of interval sphincterotomy. No intra or extrahepatic biliary ductal dilation. The liver is unremarkable. Pancreas: Unremarkable Spleen: Unremarkable Adrenals/Urinary Tract: The adrenal glands are unremarkable. The kidneys are normal in size and position. There is mild left hydronephrosis to the level of the ureteropelvic junction with decompressive the ureter thereafter in keeping with a moderate left UPJ obstruction. Superimposed parapelvic and cortical simple cysts are identified. There is symmetric enhancement of the left kidney however. Multiple simple cortical cysts are seen within the right kidney. No hydronephrosis on the right. No renal or ureteral calculi. The bladder is unremarkable. Stomach/Bowel: Moderate sigmoid diverticulosis is identified without superimposed inflammatory change. Scattered diverticula are seen within the ascending colon. Stomach, small bowel, and large bowel are otherwise unremarkable. Appendix normal. No free intraperitoneal gas or fluid Vascular/Lymphatic: Moderate soft mural plaque is identified within the distal descending thoracic aorta at the diaphragmatic hiatus. 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 Extensive atherosclerotic calcification is seen within the terminal lower extremity arterial inflow and visualized outflow. There is no pathologic adenopathy within the abdomen and pelvis. Reproductive: Status post prostatectomy. Other: Rectum unremarkable. Musculoskeletal: No lytic or blastic bone lesions within the abdomen. Review of the MIP images confirms the above findings.  IMPRESSION: 1. No evidence of acute pulmonary embolism. 2. Significant interval progression of multifocal ground-glass pulmonary infiltrates and peripheral areas of consolidation in keeping with progressive atypical infection and compatible with COVID-19 pneumonia. 3. Persistent cavitary nodule within the superior segment of the right lower lobe, with associated pathologic right hilar adenopathy. Differential considerations are unchanged, though malignancy is strongly considered given the associated pathologic right hilar adenopathy. Short-term follow-up imaging or PET-CT examination may be helpful once the patient's acute issues have resolved for further evaluation. 4. Interval probable sphincterotomy with pneumobilia. Cholelithiasis persists though superimposed pericholecystic inflammatory change has resolved. 5. Moderate left UPJ obstruction. 6. Diverticulosis without evidence of acute diverticulitis. Electronically Signed   By: Fidela Salisbury MD   On: 12/04/2019 20:02   CT Abdomen Pelvis W Contrast  Result Date: 11/15/2019 CLINICAL DATA:  79 year old male with pneumonia. Concern for abscess or effusion. EXAM: CT CHEST, ABDOMEN, AND PELVIS WITH CONTRAST TECHNIQUE: Multidetector CT imaging of the chest, abdomen and pelvis was performed following the standard protocol during bolus administration of intravenous contrast. CONTRAST:  136mL OMNIPAQUE IOHEXOL 300 MG/ML  SOLN COMPARISON:  Chest CT dated 02/14/2018. Chest radiograph dated 11/15/2019. FINDINGS: CT CHEST FINDINGS Cardiovascular: There is no cardiomegaly or  pericardial effusion. Advanced 3 vessel coronary vascular calcification. There is advanced calcified and noncalcified plaque of the thoracic aorta. No aneurysmal dilatation or dissection. The central pulmonary arteries appear patent. Mediastinum/Nodes: Right hilar adenopathy measuring 13 mm. Subcarinal lymph node measures 10 mm in short axis. The esophagus and the thyroid gland are grossly  unremarkable. No mediastinal fluid collection. Lungs/Pleura: There is background of emphysema. Bibasilar subpleural reticulation as seen on the prior CT in keeping with interstitial lung disease. Overall slight progression of the fibrotic changes since the prior CT. There is a 2.6 x 2.7 cm cavitary nodule in the superior segment of the right lower lobe which may represent an abscess, an infected pneumatocele. Fungal infection, TB or cavitary neoplasm are not excluded. Clinical correlation and follow-up to resolution is recommended. Several scattered bilateral lower lobe nodular densities, present on the prior CT. No lobar consolidation, pleural effusion, or pneumothorax. The central airways are patent. Musculoskeletal: No chest wall mass or suspicious bone lesions identified. CT ABDOMEN PELVIS FINDINGS No intra-abdominal free air or free fluid. Hepatobiliary: There is fatty infiltration of the liver. Slight irregularity of the liver contour may represent early changes of cirrhosis. Elastography may provide better evaluation. There is tumefactive sludge versus noncalcified stones within the gallbladder. There is diffuse gallbladder wall thickening and small pericholecystic fluid. Findings may be related to underlying liver disease although acute cholecystitis is not excluded. Further evaluation with right upper quadrant ultrasound recommended. Pancreas: Unremarkable. No pancreatic ductal dilatation or surrounding inflammatory changes. Spleen: Normal in size without focal abnormality. Adrenals/Urinary Tract: The adrenal glands are unremarkable. Multiple bilateral renal cysts with the largest a lobulated appearing left renal interpolar cyst measuring up to 13 cm in length. Several additional subcentimeter hypodensities are too small to characterize. There is no hydronephrosis on either side. There is symmetric enhancement and excretion of contrast by both kidneys. Mild bilateral perinephric stranding, nonspecific.  Correlation with urinalysis recommended to exclude UTI. The visualized ureters and urinary bladder appear unremarkable. Stomach/Bowel: There is severe sigmoid diverticulosis without active inflammatory changes. There is no bowel obstruction or active inflammation. The appendix is normal. Vascular/Lymphatic: There is advanced aortoiliac atherosclerotic disease. 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 The graft appears patent. There is advanced atherosclerotic calcification of the common femoral arteries bilaterally, right greater left. The IVC is unremarkable. No portal venous gas. There is no adenopathy. Reproductive: Prostatectomy. 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 Musculoskeletal: There is degenerative changes of the spine. No acute osseous pathology. IMPRESSION: 1. A 2.6 x 2.7 cm cavitary nodule in the superior segment of the right lower lobe may represent an abscess, an infected pneumatocele. Fungal infection, TB or cavitary neoplasm are not excluded. Clinical correlation and follow-up to resolution is recommended. 2. Emphysema with slight progression of the interstitial lung disease since the prior CT. 3. Fatty liver with possible early changes of cirrhosis. Elastography may provide better evaluation. 4. Gallbladder tumefactive sludge versus noncalcified stones. Diffuse gallbladder wall thickening and small pericholecystic fluid may be related to underlying liver disease although acute cholecystitis is not excluded. Further evaluation with right upper quadrant ultrasound recommended. 5. Severe sigmoid diverticulosis. No bowel obstruction. Normal appendix. 6. Aortic Atherosclerosis (ICD10-I70.0) and Emphysema (ICD10-J43.9). Electronically Signed   By: Anner Crete M.D.   On: 11/15/2019 19:11   US RENAL  Result Date: 11/18/2019 CLINICAL DATA:  Acute renal injury, COVID-19 positivity EXAM: RENAL / URINARY TRACT  ULTRASOUND COMPLETE  COMPARISON:  11/15/2019 FINDINGS: Right Kidney: Renal measurements: 12.3 x 5.1 x 4.3 cm. = volume: 140 mL. Less than 10 cysts are noted. The largest of these measures 2.7 cm in the midportion of the right kidney. Left Kidney: Renal measurements: 14.3 x 5.7 x 5.5 cm. = volume: 233 mL. Less than 10 cysts are noted. The largest of these measures 7.3 cm in greatest dimension. These are stable from the prior CT examination Bladder: Appears normal for degree of bladder distention. Other: Gallbladder is partially distended with echogenic material within similar to that seen on prior exam consistent with gallbladder sludge and stones. IMPRESSION: Gallbladder sludge and stones. Bilateral renal cysts which are simple in nature. No obstructive changes are noted. Electronically Signed   By: Inez Catalina M.D.   On: 11/18/2019 03:03   MR ABDOMEN MRCP WO CONTRAST  Result Date: 11/17/2019 CLINICAL DATA:  Cholelithiasis EXAM: MRI ABDOMEN WITHOUT CONTRAST  (INCLUDING MRCP) TECHNIQUE: Multiplanar multisequence MR imaging of the abdomen was performed. Heavily T2-weighted images of the biliary and pancreatic ducts were obtained, and three-dimensional MRCP images were rendered by post processing. COMPARISON:  CT chest, abdomen and pelvis of November 15, 2019 FINDINGS: Lower chest: Small effusions at the lung bases. As basilar airspace disease and parenchymal findings demonstrated on the recent chest CT are not well evaluated on the MRI. Hepatobiliary: Mild hepatic steatosis. No focal, suspicious hepatic lesion, assessment limited by the noncontrast imaging. Pericholecystic stranding and signs of wall thickening. Mild biliary duct distension with numerous filling defects in the common bile duct compatible with multiple small stones. Largest approximately 5 mm. At least 4-5 additional small biliary calculi in the common bile duct. Pancreas:  No ductal dilation.  No peripancreatic inflammation. Spleen: Skin normal in  size and contour without focal, suspicious lesion. Adrenals/Urinary Tract:  Adrenal glands are normal spleen. Bilateral renal cysts. Asymmetric albeit slightly Peri nephric stranding greater on the LEFT than the RIGHT. Dilated collecting systems in the upper pole and in the lower pole LEFT kidney perhaps due to mass-effect upon collecting systems in the setting of large renal sinus cysts with similar appearance to prior imaging studies. Stomach/Bowel: Stomach gastrointestinal tract with limited assessment, unremarkable to the extent evaluated. 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 Other:  None. Musculoskeletal: No suspicious bone lesions identified. IMPRESSION: 1. Choledocholithiasis with mild biliary duct dilation as described. 2. Pericholecystic stranding and signs of wall thickening, remaining suspicious for acute cholecystitis. If there are discordant clinical findings, HIDA scan may be helpful for further assessment. 3. Renal sinus cysts and stable collecting system dilation on the LEFT with renal cysts on the RIGHT and mild perinephric stranding, nonspecific and similar to previous imaging studies. 4. Small effusions at the lung bases. 5. Mild hepatic steatosis. Electronically Signed   By: Zetta Bills M.D.   On: 11/17/2019 08:06   DG Chest Portable 1 View  Result Date: 12/04/2019 CLINICAL DATA:  Shortness of breath EXAM: PORTABLE CHEST 1 VIEW COMPARISON:  11/16/2019 FINDINGS: Patchy peripheral airspace opacities throughout the right lung and in the left lower lobe concerning for pneumonia. Heart is normal size. No effusions or pneumothorax. No acute bony abnormality. IMPRESSION: Patchy peripheral airspace opacities, right greater than left concerning for pneumonia, possibly COVID pneumonia. Electronically Signed   By: Rolm Baptise M.D.   On: 12/04/2019 18:30   DG Chest Port 1 View  Result Date: 11/16/2019 CLINICAL  DATA:  Shortness of breath and fever  EXAM: PORTABLE CHEST 1 VIEW COMPARISON:  Chest radiograph and chest CT November 15, 2019 FINDINGS: There is a degree of underlying fibrosis. The cavitary lesion in the right lower lobe seen 1 day prior is less well seen by radiography although is evident, measuring 2.5 x 2.4 cm. There is a degree of lower lobe bronchiectatic change. No new opacity evident. Heart size and pulmonary vascularity are normal. Lymph node prominence seen on CT is not appreciable by radiography. There is aortic atherosclerosis. No bone lesions appreciable. IMPRESSION: Cavitary lesion right lower lobe, much better seen on recent CT. This nodular lesion measures 2.5 x 2.4 cm on portable radiographic examination. There is an underlying degree of fibrosis in the bases. No new opacity evident. Cardiac silhouette stable. Aortic Atherosclerosis (ICD10-I70.0). Electronically Signed   By: Lowella Grip III M.D.   On: 11/16/2019 09:34   DG Chest Port 1 View  Result Date: 11/15/2019 CLINICAL DATA:  Evaluate for infection. EXAM: PORTABLE CHEST 1 VIEW COMPARISON:  07/27/2015 FINDINGS: Lungs are adequately inflated without effusion or pneumothorax. Mild increased density over the right hilum/perihilar region. Cardiomediastinal silhouette and remainder the exam is unchanged. IMPRESSION: Minimal increased density over the right hilar/perihilar region. Medial airspace process is possible. Consider PA and lateral chest radiograph for better evaluation. Electronically Signed   By: Marin Olp M.D.   On: 11/15/2019 17:04   DG ERCP BILIARY & PANCREATIC DUCTS  Result Date: 11/19/2019 CLINICAL DATA:  ERCP for bile duct stones. EXAM: ERCP TECHNIQUE: Multiple spot images obtained with the fluoroscopic device and submitted for interpretation post-procedure. COMPARISON:  MRCP-11/17/2019; nuclear medicine HIDA scan-11/17/2019 FINDINGS: Eight spot intraoperative fluoroscopic images of the right upper abdominal quadrant  during ERCP are provided for review Initial image demonstrates an ERCP probe overlying the right upper abdominal quadrant Subsequent images demonstrate selective cannulation opacification of the common bile duct. There are several filling defects within the CBD, potentially representative of air bubbles versus choledocholithiasis. Subsequent images demonstrate insufflation of a balloon within the central aspect of the CBD with subsequent biliary sweeping and presumed sphincterotomy. There is minimal opacification the cystic duct with passage of contrast to the level of the gallbladder lumen. There is minimal opacification intrahepatic biliary tree which appears nondilated. IMPRESSION: ERCP with biliary sweeping and presumed sphincterotomy as above. These images were submitted for radiologic interpretation only. Please see the procedural report for the amount of contrast and the fluoroscopy time utilized. Electronically Signed   By: Sandi Mariscal M.D.   On: 11/19/2019 07:40   VAS [place] LOWER EXTREMITY VENOUS (DVT)  Result Date: 12/07/2019  Lower Venous DVTStudy Indications: Elevated ddimer.  Comparison Study: 11/20/19 previous Performing Technologist: Abram Sander RVS  Examination Guidelines: A complete evaluation includes B-mode imaging, spectral Doppler, color Doppler, and power Doppler as needed of all accessible portions of each vessel. Bilateral testing is considered an integral part of a complete examination. Limited examinations for reoccurring indications may be performed as noted. The reflux portion of the exam is performed with the patient in reverse Trendelenburg.  +---------+---------------+---------+-----------+----------+--------------+ RIGHT    CompressibilityPhasicitySpontaneityPropertiesThrombus Aging +---------+---------------+---------+-----------+----------+--------------+ CFV      Full           Yes      Yes                                  +---------+---------------+---------+-----------+----------+--------------+ SFJ      Full                                                        +---------+---------------+---------+-----------+----------+--------------+  FV Prox  Full                                                        +---------+---------------+---------+-----------+----------+--------------+ FV Mid   Full                                                        +---------+---------------+---------+-----------+----------+--------------+ FV DistalFull                                                        +---------+---------------+---------+-----------+----------+--------------+ PFV      Full                                                        +---------+---------------+---------+-----------+----------+--------------+ POP      Full           Yes      Yes                                 +---------+---------------+---------+-----------+----------+--------------+ PTV      Full                                                        +---------+---------------+---------+-----------+----------+--------------+ PERO     Full                                                        +---------+---------------+---------+-----------+----------+--------------+   +---------+---------------+---------+-----------+----------+--------------+ LEFT     CompressibilityPhasicitySpontaneityPropertiesThrombus Aging +---------+---------------+---------+-----------+----------+--------------+ CFV      Full           Yes      Yes                                 +---------+---------------+---------+-----------+----------+--------------+ SFJ      Full                                                        +---------+---------------+---------+-----------+----------+--------------+ FV Prox  Full                                                         +---------+---------------+---------+-----------+----------+--------------+  FV Mid   Full                                                        +---------+---------------+---------+-----------+----------+--------------+ FV DistalFull                                                        +---------+---------------+---------+-----------+----------+--------------+ PFV      Full                                                        +---------+---------------+---------+-----------+----------+--------------+ POP      Full           Yes      Yes                                 +---------+---------------+---------+-----------+----------+--------------+ PTV      Full                                                        +---------+---------------+---------+-----------+----------+--------------+ PERO     Full                                                        +---------+---------------+---------+-----------+----------+--------------+     Summary: BILATERAL: - No evidence of deep vein thrombosis seen in the lower extremities, bilaterally. - No evidence of superficial venous thrombosis in the lower extremities, bilaterally. -   *See table(s) above for measurements and observations.    Preliminary    VAS [place] LOWER EXTREMITY VENOUS (DVT)  Result Date: 11/21/2019  Lower Venous DVTStudy Indications: Elevated d-dimer.  Comparison Study: 07-27-2015 LEV study Performing Technologist: Darlin Coco  Examination Guidelines: A complete evaluation includes B-mode imaging, spectral Doppler, color Doppler, and power Doppler as needed of all accessible portions of each vessel. Bilateral testing is considered an integral part of a complete examination. Limited examinations for reoccurring indications may be performed as noted. The reflux portion of the exam is performed with the patient in reverse Trendelenburg.  +---------+---------------+---------+-----------+----------+--------------+  RIGHT    CompressibilityPhasicitySpontaneityPropertiesThrombus Aging +---------+---------------+---------+-----------+----------+--------------+ CFV      Full           Yes      Yes                                 +---------+---------------+---------+-----------+----------+--------------+ SFJ      Full           Yes      Yes                                 +---------+---------------+---------+-----------+----------+--------------+  FV Prox  Full                                                        +---------+---------------+---------+-----------+----------+--------------+ FV Mid   Full                                                        +---------+---------------+---------+-----------+----------+--------------+ FV DistalFull                                                        +---------+---------------+---------+-----------+----------+--------------+ PFV      Full                                                        +---------+---------------+---------+-----------+----------+--------------+ POP      Full           Yes      Yes                                 +---------+---------------+---------+-----------+----------+--------------+ PTV      Full                                                        +---------+---------------+---------+-----------+----------+--------------+ PERO     Full                                                        +---------+---------------+---------+-----------+----------+--------------+   +---------+---------------+---------+-----------+----------+--------------+ LEFT     CompressibilityPhasicitySpontaneityPropertiesThrombus Aging +---------+---------------+---------+-----------+----------+--------------+ CFV      Full           Yes      Yes                                 +---------+---------------+---------+-----------+----------+--------------+ SFJ      Full           Yes      Yes                                  +---------+---------------+---------+-----------+----------+--------------+ FV Prox  Full                                                        +---------+---------------+---------+-----------+----------+--------------+  FV Mid   Full                                                        +---------+---------------+---------+-----------+----------+--------------+ FV DistalFull                                                        +---------+---------------+---------+-----------+----------+--------------+ PFV      Full                                                        +---------+---------------+---------+-----------+----------+--------------+ POP      Full           Yes      Yes                                 +---------+---------------+---------+-----------+----------+--------------+ PTV      Full                                                        +---------+---------------+---------+-----------+----------+--------------+ PERO     Full                                                        +---------+---------------+---------+-----------+----------+--------------+     Summary: RIGHT: - There is no evidence of deep vein thrombosis in the lower extremity.  - No cystic structure found in the popliteal fossa.  LEFT: - There is no evidence of deep vein thrombosis in the lower extremity.  - No cystic structure found in the popliteal fossa.  *See table(s) above for measurements and observations. Electronically signed by [name] MD on 11/21/2019 at 4:19:57 AM.    Final    ECHOCARDIOGRAM LIMITED  Result Date: 11/16/2019    ECHOCARDIOGRAM LIMITED REPORT   Patient Name:   BEVAN DISNEY Date of Exam: 11/16/2019 Medical Rec #:  [id number]         Height:       70.0 in Accession #:    [id number]        Weight:       187.8 lb Date of Birth:  January 31, 1941         BSA:          2.032 m Patient Age:    82 years          BP:            103/63 mmHg Patient Gender: M                 HR:           58 bpm.  Exam Location:  Inpatient Procedure: Limited Echo, Color Doppler and Cardiac Doppler Indications:    CAD Native Vessel i25.10  History:        Patient has prior history of Echocardiogram examinations, most                 recent 07/29/2015. CAD; Risk Factors:Hypertension, Diabetes and                 Dyslipidemia. COVID+ at time of study.  Sonographer:    Raquel Sarna Senior RDCS Referring Phys: [place]  1. Left ventricular ejection fraction, by estimation, is 55 to 60%. The left ventricle has normal function. The left ventricle has no regional wall motion abnormalities. Left ventricular diastolic function could not be evaluated.  2. Right ventricular systolic function is normal. The right ventricular size is normal. There is mildly elevated pulmonary artery systolic pressure. The estimated right ventricular systolic pressure is 19.4 mmHg.  3. The mitral valve is grossly normal. Mild mitral valve regurgitation. No evidence of mitral stenosis.  4. The aortic valve is tricuspid. Aortic valve regurgitation is trivial. Mild aortic valve stenosis. Aortic valve mean gradient measures 9.9 mmHg. Aortic valve Vmax measures 2.15 m/s.  5. The inferior vena cava is dilated in size with <50% respiratory variability, suggesting right atrial pressure of 15 mmHg. Comparison(s): No significant change from prior study. FINDINGS  Left Ventricle: Left ventricular ejection fraction, by estimation, is 55 to 60%. The left ventricle has normal function. The left ventricle has no regional wall motion abnormalities. The left ventricular internal cavity size was normal in size. There is  no left ventricular hypertrophy. Right Ventricle: The right ventricular size is normal. No increase in right ventricular wall thickness. Right ventricular systolic function is normal. There is mildly elevated pulmonary artery systolic pressure. The tricuspid regurgitant  velocity is 2.63  m/s, and with an assumed right atrial pressure of 15 mmHg, the estimated right ventricular systolic pressure is 17.4 mmHg. Left Atrium: Left atrial size was normal in size. Right Atrium: Right atrial size was normal in size. Pericardium: Trivial pericardial effusion is present. Presence of pericardial fat pad. Mitral Valve: The mitral valve is grossly normal. Mild mitral valve regurgitation. No evidence of mitral valve stenosis. Tricuspid Valve: The tricuspid valve is grossly normal. Tricuspid valve regurgitation is trivial. No evidence of tricuspid stenosis. Aortic Valve: The aortic valve is tricuspid. Aortic valve regurgitation is trivial. Mild aortic stenosis is present. Aortic valve mean gradient measures 9.9 mmHg. Aortic valve peak gradient measures 18.4 mmHg. Aortic valve area, by VTI measures 1.26 cm. Pulmonic Valve: The pulmonic valve was grossly normal. Pulmonic valve regurgitation is trivial. No evidence of pulmonic stenosis. Aorta: The aortic root is normal in size and structure. Venous: The inferior vena cava is dilated in size with less than 50% respiratory variability, suggesting right atrial pressure of 15 mmHg. IAS/Shunts: The atrial septum is grossly normal. LEFT VENTRICLE PLAX 2D LVOT diam:     2.10 cm LV SV:         64 LV SV Index:   31 LVOT Area:     3.46 cm  LV Volumes (MOD) LV vol d, MOD A4C: 145.0 ml LV vol s, MOD A4C: 67.7 ml LV SV MOD A4C:     145.0 ml RIGHT VENTRICLE RV S prime:     9.14 cm/s TAPSE (M-mode): 2.3 cm AORTIC VALVE AV Area (Vmax):    1.11 cm AV Area (Vmean):   1.14 cm AV Area (  VTI):     1.26 cm AV Vmax:           214.63 cm/s AV Vmean:          148.039 cm/s AV VTI:            0.506 m AV Peak Grad:      18.4 mmHg AV Mean Grad:      9.9 mmHg LVOT Vmax:         68.50 cm/s LVOT Vmean:        48.900 cm/s LVOT VTI:          0.184 m LVOT/AV VTI ratio: 0.36 TRICUSPID VALVE TR Peak grad:   27.7 mmHg TR Vmax:        263.00 cm/s  SHUNTS Systemic VTI:  0.18 m Systemic  Diam: 2.10 cm Eleonore Chiquito MD Electronically signed by Eleonore Chiquito MD Signature Date/Time: 11/16/2019/12:16:24 PM    Final    US Abdomen Limited RUQ  Result Date: 11/15/2019 CLINICAL DATA:  79 year old male with nausea vomiting. EXAM: ULTRASOUND ABDOMEN LIMITED RIGHT UPPER QUADRANT COMPARISON:  CT abdomen pelvis dated 11/15/2019. FINDINGS: Gallbladder: There is sludge and stone within the gallbladder. There is diffuse gallbladder wall thickening and edema and a small pericholecystic fluid. Evaluation for sonographic Murphy's sign was limited as the patient was pre-medicated. Common bile duct: Diameter: 7 mm Liver: There is diffuse increased liver echogenicity most commonly seen in the setting of fatty infiltration. Superimposed inflammation or fibrosis is not excluded. Clinical correlation is recommended. Portal vein is patent on color Doppler imaging with normal direction of blood flow towards the liver. Other: None. IMPRESSION: 1. Gallbladder sludge and stones with diffuse edema as seen on the earlier CT. A hepatobiliary scintigraphy may provide better evaluation of the gallbladder if there is a high clinical concern for acute cholecystitis . 2. Fatty liver. Electronically Signed   By: Anner Crete M.D.   On: 11/15/2019 21:02      Phillips Climes M.D on 12/07/2019 at 3:30 PM    [facility] -  Office  [phone]

## 2019-12-07 NOTE — Evaluation (Signed)
Occupational Therapy Evaluation Patient Details Name: Cody Moss MRN: [id number] DOB: [birth year] Today's Date: 12/07/2019    History of Present Illness 79 year old male with past medical history of gout, hypertension, diabetes mellitus type 2, abdominal aortic aneurysm status post repair, pulmonary fibrosis, coronary artery disease status post PCI/stent, gastroesophageal reflux disease, hyperlipidemia and recent diagnosis of COVID-19 on 7/25 who presents with complaints of cough, poor oral intake, generalized weakness and abdominal discomfort.   Clinical Impression   PTA, pt lives with wife and mother-in-law. Pt reports Independence with all ADLs, IADLs, and mobility without use of AD. 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 Pt presents now with diagnoses above and deficits in standing balance, strength, cardiopulmonary tolerance, and safety awareness. Pt received on 3 L O2 (89-90% at rest), with desat readings 76-86% during activity. Pt denied SOB, so unsure of accuracy of readings. Pt requires Min A for LB ADLs due to deficits. Pt requires Min A for transfers and short distance mobility without AD with unsteadiness noted. Min A required to correct LOB during mobility today. Attempt transfer with RW to assess stability with pt noted to leave RW behind. Plan to trial DME for ADL mobility during next session, as pt is a high fall risk without it at this time. Suspect pt to progress to [facility] with 24/7 physical assist, but will monitor and update recommendations as needed.    Follow Up Recommendations  Home health OT;Supervision/Assistance - 24 hour;Other (comment) (SNF if 24/7 physical assist not available)    Equipment Recommendations  Other (comment) (TBD)    Recommendations for Other Services       Precautions / Restrictions Precautions Precautions: Fall Precaution Comments: monitor O2 Restrictions Weight Bearing Restrictions: No      Mobility Bed  Mobility Overal bed mobility: Needs Assistance Bed Mobility: Supine to Sit     Supine to sit: Min assist;HOB elevated     General bed mobility comments: Min A for trunk advancement with HHA  Transfers Overall transfer level: Needs assistance Equipment used: None;Rolling walker (2 wheeled) Transfers: Sit to/from [facility] Sit to Stand: Min assist Stand pivot transfers: Min assist       General transfer comment: Min A for steadying in power up without AD, required Min A to correct a lateral LOB in standing. Pt Min A for trial of RW for stand pivot to transport chair - noted to leave RW at the side    Balance Overall balance assessment: Needs assistance Sitting-balance support: Bilateral upper extremity supported;Feet supported Sitting balance-Leahy Scale: Good Sitting balance - Comments: able to attempt donning socks EOB without LOB   Standing balance support: No upper extremity supported;During functional activity Standing balance-Leahy Scale: Fair Standing balance comment: static standing fair. However, unsteadiness and LOB noted when mobilizing                           ADL either performed or assessed with clinical judgement   ADL Overall ADL's : Needs assistance/impaired Eating/Feeding: Independent;Sitting Eating/Feeding Details (indicate cue type and reason): Observed sitting EOB eating breakfast Grooming: Min guard;Standing;Oral care Grooming Details (indicate cue type and reason): Min guard for brushing teeth while standing at sink without AD. pt with unsteadiness  Upper Body Bathing: Supervision/ safety;Sitting   Lower Body Bathing: Minimal assistance;Sit to/from stand   Upper Body Dressing : Supervision/safety;Sitting   Lower Body Dressing: Minimal assistance;Sit to/from stand;Sitting/lateral leans Lower Body Dressing Details (indicate cue type  and reason): Min A to don socks sitting EOB, difficulty with L sock reporting chronic back  pain Toilet Transfer: Minimal assistance;Ambulation;Regular Glass blower/designer Details (indicate cue type and reason): Pt Min A to maintain dynamic standing balance with ambulation and handheld assist. Min A for RW trial for simple transfer Toileting- Clothing Manipulation and Hygiene: Minimal assistance;Sit to/from stand       Functional mobility during ADLs: Minimal assistance General ADL Comments: Pt with decreased strength, cardiopulmonary tolerance, and decreased standing balance. pt requires physical assist to correct LOB without AD use today.      Vision Baseline Vision/History: Wears glasses Wears Glasses: Reading only Patient Visual Report: No change from baseline Vision Assessment?: No apparent visual deficits     Perception     Praxis      Pertinent Vitals/Pain Pain Assessment: No/denies pain     Hand Dominance Right   Extremity/Trunk Assessment Upper Extremity Assessment Upper Extremity Assessment: Generalized weakness   Lower Extremity Assessment Lower Extremity Assessment: Defer to PT evaluation   Cervical / Trunk Assessment Cervical / Trunk Assessment: Normal   Communication Communication Communication: No difficulties   Cognition Arousal/Alertness: Awake/alert Behavior During Therapy: WFL for tasks assessed/performed Overall Cognitive Status: No family/caregiver present to determine baseline cognitive functioning Area of Impairment: Safety/judgement;Problem solving                         Safety/Judgement: Decreased awareness of safety;Decreased awareness of deficits   Problem Solving: Difficulty sequencing;Requires verbal cues General Comments: Pt A&Ox4 with appropriate responses throughout. However, poor safety awareness and difficulty attending to safety cues   General Comments  Pt received on 3 L O2, stats at 89-90% at rest in bed. During activity, SPO2 noted at 76-86% but pt denied SOB. Difficulty obtaining O2 readings and unsure  of accuracy    Exercises     Shoulder Instructions      Home Living Family/patient expects to be discharged to:: Private residence Living Arrangements: Spouse/significant other;Other relatives (mother in law) Available Help at Discharge: Family Type of Home: House Home Access: Ramped entrance     Home Layout: One level     Bathroom Shower/Tub: Walk-in shower;Tub only   Bathroom Toilet: Standard     Home Equipment: Shower seat - built in;Grab bars - toilet;Walker - 2 wheels;Cane - single point;Walker - 4 wheels          Prior Functioning/Environment Level of Independence: Independent        Comments: Independent with ADLs, IADLs and mobility without AD. 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 Pt was still driving and doing yard work        OT Problem List: Decreased activity tolerance;Cardiopulmonary status limiting activity;Decreased strength;Impaired balance (sitting and/or standing);Decreased safety awareness;Decreased knowledge of use of DME or AE      OT Treatment/Interventions: Therapeutic exercise;Self-care/ADL training;Energy conservation;DME and/or AE instruction;Therapeutic activities;Patient/family education    OT Goals(Current goals can be found in the care plan section) Acute Rehab OT Goals Patient Stated Goal: go home OT Goal Formulation: With patient Time For Goal Achievement: 12/15/19 Potential to Achieve Goals: Good ADL Goals Pt Will Perform Grooming: with modified independence;standing Pt Will Perform Lower Body Bathing: with modified independence;sitting/lateral leans;sit to/from stand Pt Will Perform Lower Body Dressing: with modified independence;sitting/lateral leans;sit to/from stand Pt Will Transfer to Toilet: regular height toilet;with supervision;ambulating Pt Will Perform Toileting - Clothing Manipulation and hygiene: with modified independence;sit to/from stand  OT Frequency: Min  2X/week   Barriers to D/C:             Co-evaluation              AM-PAC OT "6 Clicks" Daily Activity     Outcome Measure Help from another person eating meals?: None Help from another person taking care of personal grooming?: A Little Help from another person toileting, which includes using toliet, bedpan, or urinal?: A Little Help from another person bathing (including washing, rinsing, drying)?: A Little Help from another person to put on and taking off regular upper body clothing?: A Little Help from another person to put on and taking off regular lower body clothing?: A Little 6 Click Score: 19   End of Session Equipment Utilized During Treatment: Gait belt;Rolling walker;Oxygen Nurse Communication: Mobility status;Other (comment) (O2)  Activity Tolerance: Patient tolerated treatment well Patient left: Other (comment) (in transport chair going to endoscopy)  OT Visit Diagnosis: Unsteadiness on feet (R26.81);Other abnormalities of gait and mobility (R26.89);Muscle weakness (generalized) (M62.81)                Time: 9233-0076 OT Time Calculation (min): 39 min Charges:  OT General Charges $OT Visit: 1 Visit OT Evaluation $OT Eval Moderate Complexity: 1 Mod OT Treatments $Self Care/Home Management : 8-22 mins $Therapeutic Activity: 8-22 mins  Layla Maw, OTR/L  Layla Maw 12/07/2019, 12:50 PM

## 2019-12-07 NOTE — Op Note (Signed)
[facility] Patient Name: Cody Moss Procedure Date : 12/07/2019 MRN: [id number] Attending MD: Justice Britain , MD Date of Birth: Apr 08, 1941 CSN: [id number] Age: 79 Admit Type: Inpatient Procedure:                Upper GI endoscopy Indications:              Acute post hemorrhagic anemia, Melena,                            Postoperative assessment after ERCP with                            sphincterotomy Providers:                Justice Britain, MD, Carlyn Reichert, RN, Cletis Athens, Technician Referring MD:             Docia Chuck. Henrene Pastor, MD, [facility] Medicines:                Monitored Anesthesia Care Complications:            No immediate complications. Estimated Blood Loss:     Estimated blood loss was minimal. Procedure:                Pre-Anesthesia Assessment:                           - Prior to the procedure, a History and Physical                            was performed, and patient medications and                            allergies were reviewed. The patient's tolerance of                            previous anesthesia was also reviewed. The risks                            and benefits of the procedure and the sedation                            options and risks were discussed with the patient.                            All questions were answered, and informed consent                            was obtained. Prior Anticoagulants: The patient has                            taken Lovenox (enoxaparin), last dose was 1 day  prior to procedure. ASA Grade Assessment: III - A                            patient with severe systemic disease. After                            reviewing the risks and benefits, the patient was                            deemed in satisfactory condition to undergo the                            procedure.                           After obtaining informed consent, the  endoscope was                            passed under direct vision. Throughout the                            procedure, the patient's blood pressure, pulse, and                            oxygen saturations were monitored continuously. The                            GIF-H190 ([id number]) Olympus gastroscope was                            introduced through the mouth, and advanced to the                            second part of duodenum. The upper GI endoscopy was                            accomplished without difficulty. The patient                            tolerated the procedure. Findings:      White/brown nummular lesions were noted in the entire esophagus - query       Candida (they were on his tongue as well). Biopsies were taken with a       cold forceps for histology.      The Z-line was regular and was found 38 cm from the incisors.      A widely patent and non-obstructing Schatzki ring was found at the       gastroesophageal junction.      A 2 cm hiatal hernia was present.      Diffuse severe mucosal changes characterized by scalloping, altered       texture and a decreased vascular pattern were found at the incisura, in       the gastric antrum and in the prepyloric region of the stomach. Biopsies       were taken with a cold forceps for histology.  Two non-bleeding superficial gastric ulcers with a clean ulcer base       (Forrest Class III) were found in the prepyloric region of the stomach.       The largest lesion was 8 mm in largest dimension.      A single spot with bleeding was found on the greater curvature of the       stomach. Fulguration to ablate the lesion to prevent bleeding by argon       plasma was successful.      No other gross lesions were noted in the entire examined stomach.       Biopsies were taken with a cold forceps for histology.      A single small angioectasia with typical arborization was found in the       second portion of the duodenum.  Fulguration to ablate the lesion to       prevent bleeding by argon plasma was successful.      There was evidence of a patent sphincterotomy at the ampulla and this       was characterized by ulceration with evidence of active oozing from the       left lateral edge of the sphincterotomy. Bilious sludge was noted to be       draining from the bile duct. Thus I did not believe this was hemobilia.       The area of active oozing was successfully injected with 1 mL of a       1:10,000 solution of epinephrine for hemostasis. Coagulation for       hemostasis using bipolar probe was successful. To further allow adequate       hemostasis, two hemostatic clips were successfully placed (MR       conditional). There was no bleeding at the end of the procedure. Impression:               - White/brown nummular lesions in esophageal                            mucosa. Biopsied to rule Candida.                           - Z-line regular, 38 cm from the incisors.                           - Widely patent and non-obstructing Schatzki ring.                           - 2 cm hiatal hernia.                           - Scalloped, texture changed and decreased vascular                            pattern mucosa in the incisura, antrum and                            prepyloric region of the stomach. Biopsied.                           - Non-bleeding gastric ulcers with a clean  ulcer                            base (Forrest Class III).                           - A single spot with bleeding in the stomach.                            Treated with argon plasma coagulation (APC).                           - A single angioectasia in the duodenum. Treated                            with argon plasma coagulation (APC).                           - Patent sphincterotomy, characterized by                            ulceration and bleeding was found. Injected.                            Treated with bipolar cautery. Clips  (MR                            conditional) were placed. Recommendation:           - The patient will be observed post-procedure,                            until all discharge criteria are met.                           - Return patient to hospital ward for ongoing care.                           - Full liquid diet today.                           - Start Nystatin Swish/Spit for now.                           - Continue IV PPI BID x 24 more hours. If doing                            well then may transition to PO PPI BID. Would                            maintain for 52-month at twice daily dosing. Then                            downtitrate to once daily dosing.                           -  Trend Hgb/Hct.                           - Recommend in future patient still be considered                            for possible cholecystectomy if his pulmonary                            condition improves.                           - The findings and recommendations were discussed                            with the patient.                           - The findings and recommendations were discussed                            with the patient's family.                           - The findings and recommendations were discussed                            with the referring physician. Procedure Code(s):        --- Professional ---                           [phone], Esophagogastroduodenoscopy, flexible,                            transoral; with biopsy, single or multiple Diagnosis Code(s):        --- Professional ---                           K22.8, Other specified diseases of esophagus                           K22.2, Esophageal obstruction                           K44.9, Diaphragmatic hernia without obstruction or                            gangrene                           K31.89, Other diseases of stomach and duodenum                           K25.9, Gastric ulcer, unspecified as acute or                             chronic, without hemorrhage or perforation  K92.2, Gastrointestinal hemorrhage, unspecified                           K31.819, Angiodysplasia of stomach and duodenum                            without bleeding                           Z98.890, Other specified postprocedural states                           D62, Acute posthemorrhagic anemia                           K92.1, Melena (includes Hematochezia)                           Z09, Encounter for follow-up examination after                            completed treatment for conditions other than                            malignant neoplasm CPT copyright 2019 [employer]. All rights reserved. The codes documented in this report are preliminary and upon coder review may  be revised to meet current compliance requirements. Justice Britain, MD 12/07/2019 12:30:53 PM Number of Addenda: 0

## 2019-12-07 NOTE — Plan of Care (Signed)
  Problem: Education: Goal: Knowledge of General Education information will improve Description: Including pain rating scale, medication(s)/side effects and non-pharmacologic comfort measures Outcome: Progressing   Problem: Clinical Measurements: Goal: Ability to maintain clinical measurements within normal limits will improve Outcome: Progressing   Problem: Clinical Measurements: Goal: Respiratory complications will improve Outcome: Progressing   Problem: Nutrition: Goal: Adequate nutrition will be maintained Outcome: Progressing   Problem: Elimination: Goal: Will not experience complications related to bowel motility Outcome: Progressing   Problem: Elimination: Goal: Will not experience complications related to urinary retention Outcome: Progressing

## 2019-12-07 NOTE — Transfer of Care (Signed)
Immediate Anesthesia Transfer of Care Note  Patient: Cody Moss  Procedure(s) Performed: ESOPHAGOGASTRODUODENOSCOPY (EGD) (N/A ) BIOPSY SUBMUCOSAL INJECTION HOT HEMOSTASIS (ARGON PLASMA COAGULATION/BICAP) (N/A ) HEMOSTASIS CLIP PLACEMENT  Patient Location: Endoscopy Unit  Anesthesia Type:MAC  Level of Consciousness: drowsy  Airway & Oxygen Therapy: Patient Spontanous Breathing and Patient connected to nasal cannula oxygen  Post-op Assessment: Report given to RN and Post -op Vital signs reviewed and stable  Post vital signs: Reviewed and stable  Last Vitals:  Vitals Value Taken Time  BP    Temp    Pulse    Resp    SpO2      Last Pain:  Vitals:   12/07/19 1108  TempSrc: Oral  PainSc: 0-No pain         Complications: No complications documented.

## 2019-12-08 ENCOUNTER — Telehealth: Payer: Self-pay

## 2019-12-08 ENCOUNTER — Encounter (HOSPITAL_COMMUNITY): Payer: Self-pay | Admitting: Gastroenterology

## 2019-12-08 DIAGNOSIS — K3189 Other diseases of stomach and duodenum: Secondary | ICD-10-CM

## 2019-12-08 DIAGNOSIS — K8043 Calculus of bile duct with acute cholecystitis with obstruction: Secondary | ICD-10-CM

## 2019-12-08 DIAGNOSIS — K8042 Calculus of bile duct with acute cholecystitis without obstruction: Secondary | ICD-10-CM

## 2019-12-08 DIAGNOSIS — B3781 Candidal esophagitis: Secondary | ICD-10-CM

## 2019-12-08 DIAGNOSIS — K279 Peptic ulcer, site unspecified, unspecified as acute or chronic, without hemorrhage or perforation: Secondary | ICD-10-CM

## 2019-12-08 DIAGNOSIS — K91840 Postprocedural hemorrhage and hematoma of a digestive system organ or structure following a digestive system procedure: Secondary | ICD-10-CM

## 2019-12-08 LAB — GLUCOSE, CAPILLARY
Glucose-Capillary: 189 mg/dL — ABNORMAL HIGH (ref 70–99)
Glucose-Capillary: 302 mg/dL — ABNORMAL HIGH (ref 70–99)
Glucose-Capillary: 240 mg/dL — ABNORMAL HIGH (ref 70–99)
Glucose-Capillary: 180 mg/dL — ABNORMAL HIGH (ref 70–99)

## 2019-12-08 LAB — COMPREHENSIVE METABOLIC PANEL
ALT: 15 U/L (ref 0–44)
AST: 16 U/L (ref 15–41)
Albumin: 2.3 g/dL — ABNORMAL LOW (ref 3.5–5.0)
Alkaline Phosphatase: 36 U/L — ABNORMAL LOW (ref 38–126)
Anion gap: 9 (ref 5–15)
BUN: 29 mg/dL — ABNORMAL HIGH (ref 8–23)
CO2: 27 mmol/L (ref 22–32)
Calcium: 9.2 mg/dL (ref 8.9–10.3)
Chloride: 101 mmol/L (ref 98–111)
Creatinine, Ser: 1.12 mg/dL (ref 0.61–1.24)
GFR calc Af Amer: >60 mL/min (ref >60)
GFR calc non Af Amer: >60 mL/min (ref >60)
Glucose, Bld: 165 mg/dL — ABNORMAL HIGH (ref 70–99)
Potassium: 4.6 mmol/L (ref 3.5–5.1)
Sodium: 137 mmol/L (ref 135–145)
Total Bilirubin: 0.4 mg/dL (ref 0.3–1.2)
Total Protein: 5.7 g/dL — ABNORMAL LOW (ref 6.5–8.1)

## 2019-12-08 LAB — CBC WITH DIFFERENTIAL/PLATELET
Abs Immature Granulocytes: 0.03 10*3/uL (ref 0.00–0.07)
Basophils Absolute: 0 10*3/uL (ref 0.0–0.1)
Basophils Relative: 0 %
Eosinophils Absolute: 0 10*3/uL (ref 0.0–0.5)
Eosinophils Relative: 0 %
HCT: 24 % — ABNORMAL LOW (ref 39.0–52.0)
Hemoglobin: 7.7 g/dL — ABNORMAL LOW (ref 13.0–17.0)
Immature Granulocytes: 1 %
Lymphocytes Relative: 8 %
Lymphs Abs: 0.5 10*3/uL — ABNORMAL LOW (ref 0.7–4.0)
MCH: 30 pg (ref 26.0–34.0)
MCHC: 32.1 g/dL (ref 30.0–36.0)
MCV: 93.4 fL (ref 80.0–100.0)
Monocytes Absolute: 0.4 10*3/uL (ref 0.1–1.0)
Monocytes Relative: 7 %
Neutro Abs: 4.8 10*3/uL (ref 1.7–7.7)
Neutrophils Relative %: 84 %
Platelets: 197 10*3/uL (ref 150–400)
RBC: 2.57 MIL/uL — ABNORMAL LOW (ref 4.22–5.81)
RDW: 14.6 % (ref 11.5–15.5)
WBC: 5.7 10*3/uL (ref 4.0–10.5)
nRBC: 0 % (ref 0.0–0.2)

## 2019-12-08 LAB — SURGICAL PATHOLOGY

## 2019-12-08 LAB — C-REACTIVE PROTEIN: CRP: 1.7 mg/dL — ABNORMAL HIGH (ref <1.0)

## 2019-12-08 LAB — MAGNESIUM: Magnesium: 1.9 mg/dL (ref 1.7–2.4)

## 2019-12-08 LAB — HEMOGLOBIN AND HEMATOCRIT, BLOOD
HCT: 28.1 % — ABNORMAL LOW (ref 39.0–52.0)
Hemoglobin: 9 g/dL — ABNORMAL LOW (ref 13.0–17.0)

## 2019-12-08 MED ORDER — METHYLPREDNISOLONE SODIUM SUCC 40 MG IJ SOLR
40.0000 mg | Freq: Two times a day (BID) | INTRAMUSCULAR | Status: DC
  Administered 2019-12-08 – 2019-12-09 (×2): 40 mg via INTRAVENOUS
  Filled 2019-12-08 (×3): qty 1

## 2019-12-08 MED ORDER — PANTOPRAZOLE SODIUM 40 MG PO TBEC
40.0000 mg | DELAYED_RELEASE_TABLET | Freq: Two times a day (BID) | ORAL | Status: DC
  Administered 2019-12-08 – 2019-12-09 (×2): 40 mg via ORAL
  Filled 2019-12-08 (×2): qty 1

## 2019-12-08 MED ORDER — GLUCERNA SHAKE PO LIQD
237.0000 mL | Freq: Three times a day (TID) | ORAL | Status: DC
  Administered 2019-12-08 – 2019-12-09 (×4): 237 mL via ORAL

## 2019-12-08 NOTE — Plan of Care (Signed)

## 2019-12-08 NOTE — Progress Notes (Signed)
Inpatient Diabetes Program Recommendations  AACE/ADA: New Consensus Statement on Inpatient Glycemic Control  Target Ranges:  Prepandial:   less than 140 mg/dL      Peak postprandial:   less than 180 mg/dL (1-2 hours)      Critically ill patients:  140 - 180 mg/dL   Results for Cody Moss, Cody Moss" (MRN [id number]) as of 12/08/2019 09:39  Ref. Range 12/07/2019 07:31 12/07/2019 11:16 12/07/2019 15:44 12/07/2019 20:34 12/08/2019 07:46  Glucose-Capillary Latest Ref Range: 70 - 99 mg/dL 181 (H) 205 (H) 335 (H) 284 (H) 189 (H)   Review of Glycemic Control  Diabetes history: DM2 Outpatient Diabetes medications: Metformin 500 mg BID Current orders for Inpatient glycemic control: Novolog 0-15 units AC&HS; Solumedrol 40 mg Q8H  Inpatient Diabetes Program Recommendations:    Insulin: If steroids are continued, please consider ordering Novolog 4 units TID with meals for meal coverage if patient eats at least 50% of meals.  Thanks, Barnie Alderman, RN, MSN, CDE Diabetes Coordinator Inpatient Diabetes Program [phone] (Team Pager from 8am to 5pm)

## 2019-12-08 NOTE — Telephone Encounter (Signed)
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

## 2019-12-08 NOTE — Progress Notes (Signed)
Daily Rounding Note  12/08/2019, 9:27 AM  LOS: 4 days   SUBJECTIVE:   Chief complaint: gi bleed at site of biliary sphincterotomy.    Stools brown today.  Tolerating  HH/CM diet.  Doing well per RN/  OBJECTIVE:         Vital signs in last 24 hours:    Temp:  [97.4 F (36.3 C)-97.8 F (36.6 C)] 97.8 F (36.6 C) (08/17 0750) Pulse Rate:  [53-68] 53 (08/17 0750) Resp:  [16-20] 19 (08/17 0750) BP: (116-149)/(52-91) 148/64 (08/17 0750) SpO2:  [91 %-100 %] 91 % (08/17 0846) Weight:  [78 kg] 78 kg (08/16 1108) Last BM Date: 12/07/19 Filed Weights   12/04/19 1748 12/07/19 1108  Weight: 78 kg 78 kg   Not re-examined.    Intake/Output from previous day: 08/16 0701 - 08/17 0700 In: 924.8 [P.O.:840; I.V.:84.8] Out: 825 [Urine:825]  Intake/Output this shift: Total I/O In: 360 [P.O.:360] Out: -   Lab Results: Recent Labs    12/06/19 1659 12/06/19 1659 12/07/19 0103 12/07/19 1439 12/08/19 0108  WBC 6.1  --  4.8  --  5.7  HGB 9.1*   < > 7.9* 8.4* 7.7*  HCT 28.6*   < > 24.3* 26.5* 24.0*  PLT 158  --  190  --  197   < > = values in this interval not displayed.   BMET Recent Labs    12/06/19 0247 12/06/19 0247 12/06/19 1659 12/07/19 0103 12/08/19 0108  NA 134*  --   --  135 137  K 5.5*   < > 4.2 4.0 4.6  CL 102  --   --  101 101  CO2 22  --   --  23 27  GLUCOSE 128*  --   --  150* 165*  BUN 50*  --   --  38* 29*  CREATININE 1.30*  --   --  1.19 1.12  CALCIUM 9.4  --   --  8.9 9.2   < > = values in this interval not displayed.   LFT Recent Labs    12/06/19 0247 12/07/19 0103 12/08/19 0108  PROT 6.2* 5.8* 5.7*  ALBUMIN 2.3* 2.1* 2.3*  AST 23 20 16   ALT 18 16 15   ALKPHOS 39 37* 36*  BILITOT 0.4 0.4 0.4   PT/INR No results for input(s): LABPROT, INR in the last 72 hours. Hepatitis Panel No results for input(s): HEPBSAG, HCVAB, HEPAIGM, HEPBIGM in the last 72 hours.  Studies/Results: VAS [place]  LOWER EXTREMITY VENOUS (DVT)  Result Date: 12/07/2019 Summary: BILATERAL: - No evidence of deep vein thrombosis seen in the lower extremities, bilaterally.  No evidence of superficial venous thrombosis in the lower extremities, bilaterally.  Electronically signed by Monica Martinez MD on 12/07/2019 at 3:44:25 PM.    Final     ASSESMENT:   *  GI bleed w melena.   7/28 ERCP w sphincterotomy and stone removal  8/16 EGDclean-based, non-bleeding gastric ulcers.  Spot of gastric bleeding eradicated w APC.  Non-bleeding, duoudenal  AVM APCd.  Bleeding and ulceration at site of sphincterotomy treated w epi, bicap, 2 endoclips. Other less relevant findings" HH, patent Schatzki ring, white lesions in esophagus/ro candida. Oral nystatin jswish/swallow,  Protonix gtt  in place  *   ABL anemia.  No transfusions this admission Hgb 8.5 >> 9.1 >> 7.7.        PLAN   *    Awaiting path from multiple  biopsies.    *   Begin oral Protonix 40 mg bid for 2 months, then drop to 1 x daily.  Continue Nystatin.    *   Needs to pursue planned fup w genl surgeon re cholecystectomy.    *   CBC in AM.       Azucena Freed  12/08/2019, 9:27 AM Phone [phone]

## 2019-12-08 NOTE — Telephone Encounter (Signed)
The labs have been entered for repeat in 6 weeks.

## 2019-12-08 NOTE — Progress Notes (Signed)
PROGRESS NOTE                                                                                                                                                                                                             Patient Demographics:    Cody Moss, is a 79 y.o. male, DOB - [birth year], CBJ:[id number]  Admit date - 12/04/2019   Admitting Physician Vernelle Emerald, MD  Outpatient Primary MD for the patient is [name], MD  LOS - 4   Chief Complaint  Patient presents with  . Shortness of Breath       Brief Narrative   79 year old male with past medical history of gout, hypertension, diabetes mellitus type 2, abdominal aortic aneurysm status post repair, pulmonary fibrosis, coronary artery disease status post PCI/stent, gastroesophageal reflux disease, hyperlipidemia and recent diagnosis of COVID-19 on 7/25 who presents with complaints of cough, poor oral intake, generalized weakness and abdominal discomfort.  Of note, patient was recently admitted to [facility] from 7/25-7/30.  Patient was admitted for COVID-19 infection (Dx 7/25) but during the hospitalization was also found to have a cavitary lesion of the right lower lobe of the lungs.  Patient was also found to have E. coli bacteremia and concurrently found to have evidence of choledocholithiasis and suspected cholangitis.  Patient was treated with a course of intravenous Zosyn and azithromycin.  Patient underwent ERCP and sphincterotomy with stone extraction by Dr. Henrene Pastor.  Patient was eventually discharged on 7/30 on Augmentin and azithromycin with eventual plan for the patient to follow-up with Dr.  Melvyn Novas with pulmonology.    Since the patient's discharge on 7/25 the patient has continued to experience productive cough, severe in intensity with white to yellow sputum.  Patient initially denied associated shortness of breath but upon further questioning patient eventually  stated yes.   -His work-up in ED was significant for worsening Covid pneumonia, new oxygen requirement, and he did report melena as well .    Subjective:    [name] today has good appetite, he is on room air this morning, reports shortness of breath has improved.   Assessment  & Plan :    Principal Problem:   Acute respiratory failure with hypoxia [facility]) Active Problems:   Coronary artery disease involving native coronary artery of native heart without angina pectoris  Gout   Essential hypertension   Cavitating mass in right lower lung lobe   Abdominal pain   Pneumonia due to COVID-19 virus   Uncontrolled type 2 diabetes mellitus with hyperglycemia, without long-term current use of insulin (HCC)   GERD without esophagitis   Mixed diabetic hyperlipidemia associated with type 2 diabetes mellitus (HCC)   Dehydration   Hyperkalemia   Lactic acidosis   Lumbar back pain  Acute respiratory failure with hypoxia -This is most likely in the setting of recent COVID-19 pneumonia, with possible residual pneumonia with inflammation. -Hypoxia has resolved, he is on room air this morning, he was encouraged to use incentive spirometry and flutter valve. -Procalcitonin is low at 0.14, so we will hold on IV antibiotics.  Upper GI bleed/acute blood loss anemia -Presents with melena, Hemoccult positive, hemoglobin with a drop . 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 -Management per GI, initially on Protonix drip, currently transitioned to p.o. Protonix, recommendation to continue twice daily for 8 weeks. -Continue with Nystatin for possible candidal esophagitis. -Hemoglobin of 7.7, will repeat this afternoon, will repeat CBC in a.m., so far no indication for  transfusion. - Needs to pursue planned fup w genl surgeon re cholecystectomy as an outpatient given recent hospitalization for cholangitis  Cavitating mass in right lower lung lobe -Following with pulmonary Dr. Morrison Old, and recent hospitalization and elevated procalcitonin he was treated with antibiotics, appears with no improvement in the mass despite normalization of procalcitonin, patient possibility of malignancy, patient need to follow with pulmonary as an outpatient to have PET /CT scan done, this has been discussed with patient and daughter, they understand the importance of the follow-up.  Moderate left UPJ obstruction - discussed with urology Dr Alinda Money, he is following with Dr. Diona Fanti as outpatient for history of prostate cancer, Dr. Alinda Money reviewed imaging, actually no significant changes from recent scan 7/25, no significant hydronephrosis, mainly significant for chronic cyst.  At this point no recommendation for further work-up unless he has worsening renal function the setting of obstruction then he will need repeat imaging.  Pneumonia due to COVID-19 virus -Please see above discussion -Patient is> 21 of initial positive COVID-19 test, discussed with infectious disease Dr. Caren Griffins, given his CT value is 36.9(>32), we can go ahead and DC his isolation, isolation has been discontinued, awaiting for bed availability.  Elevated D-dimers -CTA chest with no evidence of PE, venous Doppler negative for DVT as well, for now DVT prophylaxis has been held in the setting of GI bleed . -SCD for DVT prophylaxis, .   Coronary artery disease involving native coronary artery of native heart without angina pectoris/history of PAD - Patient is currently chest pain-free, troponin unremarkable, EKG reveals no evidence of dynamic change. -holding  aspirin in the setting of   GI bleed  Essential hypertension - Continue home regimen of antihypertensive therapy.  I have stopped Avapro given  hyperkalemia, will add as needed hydralazine, but overall blood pressure is controlled  Uncontrolled type 2 diabetes mellitus with hyperglycemia, without long-term current use of insulin (HCC) - Accu-Cheks before every meal and nightly with sliding scale insulin - Holding home regimen of oral hypoglycemics.  Dehydration - continue with IV fluids  Hyperkalemia -Resolved, continue to hold Avapro   COVID-19 Labs  Recent Labs    12/06/19 0247 12/07/19 0103 12/08/19  0108  DDIMER 3.87* 2.55*  --   CRP 7.5* 3.2* 1.7*    Lab Results  Component Value Date   SARSCOV2NAA POSITIVE (A) 12/07/2019   SARSCOV2NAA POSITIVE (A) 11/15/2019   [place] Not Detected 07/17/2019   [place] NEGATIVE 05/01/2019     Code Status : DNR  Family Communication  : daughter has been updated daily  Disposition Plan  :  Status is: Inpatient  Remains inpatient appropriate because:Hemodynamically unstable and IV treatments appropriate due to intensity of illness or inability to take PO   Dispo: The patient is from: Home              Anticipated d/c is to: Home              Anticipated d/c date is: 2 days              Patient currently is not medically stable to d/c.  Hopefully patient can be discharged in 1 to 2 days if his hemoglobin is stable, and he remains with no oxygen requirement.       Consults  :  None  Procedures  : None  DVT Prophylaxis  :  [place] lovenox stopped due to GI bleed, currently on SCDs  Lab Results  Component Value Date   PLT 197 12/08/2019    Antibiotics  :    Anti-infectives (From admission, onward)   Start     Dose/Rate Route Frequency Ordered Stop   12/04/19 2100  ceFEPIme (MAXIPIME) 2 g in sodium chloride 0.9 % 100 mL IVPB        2 g 200 mL/hr over 30 Minutes Intravenous  Once 12/04/19 2050 12/04/19 2158   12/04/19 2100  vancomycin (VANCOREADY) IVPB 1500 mg/300 mL        1,500 mg 150 mL/hr over 120 Minutes Intravenous  Once 12/04/19 2053 12/05/19  0005   12/04/19 2045  vancomycin (VANCOCIN) IVPB 1000 mg/200 mL premix  Status:  Discontinued        1,000 mg 200 mL/hr over 60 Minutes Intravenous  Once 12/04/19 2031 12/04/19 2053   12/04/19 2045  piperacillin-tazobactam (ZOSYN) IVPB 2.25 g  Status:  Discontinued        2.25 g 100 mL/hr over 30 Minutes Intravenous  Once 12/04/19 2031 12/04/19 2053        Objective:   Vitals:   12/07/19 2249 12/08/19 0206 12/08/19 0750 12/08/19 0846  BP: (!) 126/52  (!) 148/64   Pulse: 62 60 (!) 53   Resp: 18 16 19    Temp: (!) 97.4 F (36.3 C)  97.8 F (36.6 C)   TempSrc:      SpO2: 92% 95% 92% 91%  Weight:      Height:        Wt Readings from Last 3 Encounters:  12/07/19 78 kg  11/18/19 85.2 kg  08/12/19 87.5 kg     Intake/Output Summary (Last 24 hours) at 12/08/2019 1134 Last data filed at 12/08/2019 2094 Gross per 24 hour  Intake 924.82 ml  Output 825 ml  Net 99.82 ml     Physical Exam  awake Alert, Oriented X 3, No new F.N deficits, Normal affect Symmetrical Chest wall movement, Good air movement bilaterally, CTAB RRR,No Gallops,Rubs or new Murmurs, No Parasternal Heave +ve B.Sounds, Abd Soft, No tenderness, No rebound - guarding or rigidity. No Cyanosis, Clubbing or edema, No new Rash or bruise      Data Review:    CBC Recent Labs  Lab 12/04/19  1754 12/04/19 2311 12/05/19 0606 12/05/19 0606 12/06/19 0247 12/06/19 1659 12/07/19 0103 12/07/19 1439 12/08/19 0108  WBC 6.4  --  4.8  --  4.9 6.1 4.8  --  5.7  HGB 10.3*   < > 9.0*   < > 8.6* 9.1* 7.9* 8.4* 7.7*  HCT 32.8*   < > 29.2*   < > 27.5* 28.6* 24.3* 26.5* 24.0*  PLT 130*  --  150  --  224 158 190  --  197  MCV 96.5  --  98.6  --  97.5 96.0 92.4  --  93.4  MCH 30.3  --  30.4  --  30.5 30.5 30.0  --  30.0  MCHC 31.4  --  30.8  --  31.3 31.8 32.5  --  32.1  RDW 14.6  --  14.8  --  14.6 14.5 14.4  --  14.6  LYMPHSABS 0.7  --  0.5*  --  0.5*  --  0.5*  --  0.5*  MONOABS 0.4  --  0.2  --  0.3  --  0.2  --  0.4   EOSABS 0.0  --  0.0  --  0.0  --  0.0  --  0.0  BASOSABS 0.0  --  0.0  --  0.0  --  0.0  --  0.0   < > = values in this interval not displayed.    Chemistries  Recent Labs  Lab 12/04/19 1754 12/04/19 1754 12/04/19 2311 12/04/19 2311 12/05/19 0606 12/06/19 0247 12/06/19 1659 12/07/19 0103 12/08/19 0108  NA 135   < > 137  --  134* 134*  --  135 137  K 5.3*   < > 4.6   < > 5.1 5.5* 4.2 4.0 4.6  CL 99  --   --   --  101 102  --  101 101  CO2 19*  --   --   --  21* 22  --  23 27  GLUCOSE 193*  --   --   --  147* 128*  --  150* 165*  BUN 76*  --   --   --  64* 50*  --  38* 29*  CREATININE 1.73*  --   --   --  1.48* 1.30*  --  1.19 1.12  CALCIUM 9.9  --   --   --  9.0 9.4  --  8.9 9.2  MG  --   --   --   --  2.5* 2.1  --  1.9 1.9  AST 35  --   --   --  42* 23  --  20 16  ALT 21  --   --   --  20 18  --  16 15  ALKPHOS 48  --   --   --  43 39  --  37* 36*  BILITOT 1.1  --   --   --  0.6 0.4  --  0.4 0.4   < > = values in this interval not displayed.   ------------------------------------------------------------------------------------------------------------------ No results for input(s): CHOL, HDL, LDLCALC, TRIG, CHOLHDL, LDLDIRECT in the last 72 hours.  Lab Results  Component Value Date   HGBA1C 7.5 (H) 11/15/2019   ------------------------------------------------------------------------------------------------------------------ No results for input(s): TSH, T4TOTAL, T3FREE, THYROIDAB in the last 72 hours.  Invalid input(s): FREET3 ------------------------------------------------------------------------------------------------------------------ No results for input(s): VITAMINB12, FOLATE, FERRITIN, TIBC, IRON, RETICCTPCT in the last 72 hours.  Coagulation profile No results for input(s): INR,  PROTIME in the last 168 hours.  Recent Labs    12/06/19 0247 12/07/19 0103  DDIMER 3.87* 2.55*    Cardiac Enzymes No results for input(s): CKMB, TROPONINI, MYOGLOBIN in the  last 168 hours.  Invalid input(s): CK ------------------------------------------------------------------------------------------------------------------    Component Value Date/Time   BNP 64.6 12/05/2019 0602    Inpatient Medications  Scheduled Meds: . albuterol  2 puff Inhalation Q6H  . allopurinol  300 mg Oral Daily  . bicalutamide  50 mg Oral QHS  . feeding supplement (ENSURE ENLIVE)  237 mL Oral TID BM  . fenofibrate  160 mg Oral QPM  . finasteride  5 mg Oral QHS  . insulin aspart  0-15 Units Subcutaneous TID AC & HS  . methylPREDNISolone (SOLU-MEDROL) injection  40 mg Intravenous Q8H  . nystatin  5 mL Oral QID  . pantoprazole  40 mg Oral BID  . pneumococcal 23 valent vaccine  0.5 mL Intramuscular Tomorrow-1000  . saccharomyces boulardii  250 mg Oral BID   Continuous Infusions:  PRN Meds:.acetaminophen, ALPRAZolam, colchicine, hydrALAZINE, ondansetron **OR** ondansetron (ZOFRAN) IV, oxyCODONE-acetaminophen, polyethylene glycol, sodium chloride  Micro Results Recent Results (from the past 240 hour(s))  Blood culture (routine x 2)     Status: Abnormal   Collection Time: 12/04/19  5:54 PM   Specimen: BLOOD  Result Value Ref Range Status   Specimen Description BLOOD RIGHT ANTECUBITAL  Final   Special Requests   Final    BOTTLES DRAWN AEROBIC AND ANAEROBIC Blood Culture adequate volume   Culture  Setup Time   Final    GRAM POSITIVE COCCI IN CLUSTERS ANAEROBIC BOTTLE ONLY CRITICAL RESULT CALLED TO, READ BACK BY AND VERIFIED WITH: J EAKINS,PHARMD AT 2056 12/05/19 BY L BENFIELD    Culture (A)  Final    STAPHYLOCOCCUS SPECIES (COAGULASE NEGATIVE) THE SIGNIFICANCE OF ISOLATING THIS ORGANISM FROM A SINGLE SET OF BLOOD CULTURES WHEN MULTIPLE SETS ARE DRAWN IS UNCERTAIN. PLEASE NOTIFY THE MICROBIOLOGY DEPARTMENT WITHIN ONE WEEK IF SPECIATION AND SENSITIVITIES ARE REQUIRED. Performed at [facility], [place] [address] [place],  [address]    Report Status 12/07/2019  FINAL  Final  Blood Culture ID Panel (Reflexed)     Status: Abnormal   Collection Time: 12/04/19  5:54 PM  Result Value Ref Range Status   Enterococcus faecalis NOT DETECTED NOT DETECTED Final   Enterococcus Faecium NOT DETECTED NOT DETECTED Final   Listeria monocytogenes NOT DETECTED NOT DETECTED Final   Staphylococcus species DETECTED (A) NOT DETECTED Final    Comment: CRITICAL RESULT CALLED TO, READ BACK BY AND VERIFIED WITH: J EAKINS,PHARMD AT 2056 12/05/19 BY L BENFIELD    Staphylococcus aureus (BCID) NOT DETECTED NOT DETECTED Final   Staphylococcus epidermidis DETECTED (A) NOT DETECTED Final    Comment: Methicillin (oxacillin) resistant coagulase negative staphylococcus. Possible blood culture contaminant (unless isolated from more than one blood culture draw or clinical case suggests pathogenicity). No antibiotic treatment is indicated for blood  culture contaminants. CRITICAL RESULT CALLED TO, READ BACK BY AND VERIFIED WITH: J EAKINS,PHARMD AT 2056 12/05/19 BY L BENFIELD    Staphylococcus lugdunensis NOT DETECTED NOT DETECTED Final   Streptococcus species NOT DETECTED NOT DETECTED Final   Streptococcus agalactiae NOT DETECTED NOT DETECTED Final   Streptococcus pneumoniae NOT DETECTED NOT DETECTED Final   Streptococcus pyogenes NOT DETECTED NOT DETECTED Final   A.calcoaceticus-baumannii NOT DETECTED NOT DETECTED Final   Bacteroides fragilis NOT DETECTED NOT DETECTED Final   Enterobacterales NOT DETECTED NOT DETECTED Final  Enterobacter cloacae complex NOT DETECTED NOT DETECTED Final   Escherichia coli NOT DETECTED NOT DETECTED Final   Klebsiella aerogenes NOT DETECTED NOT DETECTED Final   Klebsiella oxytoca NOT DETECTED NOT DETECTED Final   Klebsiella pneumoniae NOT DETECTED NOT DETECTED Final   Proteus species NOT DETECTED NOT DETECTED Final   Salmonella species NOT DETECTED NOT DETECTED Final   Serratia marcescens NOT DETECTED NOT DETECTED Final   Haemophilus influenzae NOT  DETECTED NOT DETECTED Final   Neisseria meningitidis NOT DETECTED NOT DETECTED Final   Pseudomonas aeruginosa NOT DETECTED NOT DETECTED Final   Stenotrophomonas maltophilia NOT DETECTED NOT DETECTED Final   Candida albicans NOT DETECTED NOT DETECTED Final   Candida auris NOT DETECTED NOT DETECTED Final   Candida glabrata NOT DETECTED NOT DETECTED Final   Candida krusei NOT DETECTED NOT DETECTED Final   Candida parapsilosis NOT DETECTED NOT DETECTED Final   Candida tropicalis NOT DETECTED NOT DETECTED Final   Cryptococcus neoformans/gattii NOT DETECTED NOT DETECTED Final   Methicillin resistance mecA/C DETECTED (A) NOT DETECTED Final    Comment: CRITICAL RESULT CALLED TO, READ BACK BY AND VERIFIED WITH: J EAKINS,PHARMD AT 2056 12/05/19 BY L BENFIELD Performed at [facility], [address], [place], [place] [address]   Blood culture (routine x 2)     Status: None (Preliminary result)   Collection Time: 12/04/19  6:05 PM   Specimen: BLOOD RIGHT HAND  Result Value Ref Range Status   Specimen Description BLOOD RIGHT HAND  Final   Special Requests   Final    BOTTLES DRAWN AEROBIC AND ANAEROBIC Blood Culture adequate volume   Culture   Final    NO GROWTH 4 DAYS Performed at [place], [place] [address] [place], [place] [address]    Report Status PENDING  Incomplete  SARS Coronavirus 2 by RT PCR (hospital order, performed in [facility] hospital lab) Nasopharyngeal Nasopharyngeal Swab     Status: Abnormal   Collection Time: 12/07/19  9:27 AM   Specimen: Nasopharyngeal Swab  Result Value Ref Range Status   SARS Coronavirus 2 POSITIVE (A) NEGATIVE Final    Comment: RESULT CALLED TO, READ BACK BY AND VERIFIED WITH: RN Ashley Akin [phone] MLM (NOTE) SARS-CoV-2 target nucleic acids are DETECTED  SARS-CoV-2 RNA is generally detectable in upper respiratory specimens  during the acute phase of infection.  Positive results are indicative  of the presence of the identified virus,  but do not rule out bacterial infection or co-infection with other pathogens not detected by the test.  Clinical correlation with patient history and  other diagnostic information is necessary to determine patient infection status.  The expected result is negative.  Fact Sheet for Patients:   StrictlyIdeas.no   Fact Sheet for Healthcare Providers:   BankingDealers.co.za    This test is not yet approved or cleared by the [place] FDA and  has been authorized for detection and/or diagnosis of SARS-CoV-2 by FDA under an Emergency Use Authorization (EUA).  This EUA will remain in effect (meaning this test can  be used) for the duration of  the COVID-19 declaration under Section 564(b)(1) of the Act, 21 U.S.C. section 360-bbb-3(b)(1), unless the authorization is terminated or revoked sooner.  Performed at [facility], [place] [address], [place], [place] [address]     Radiology Reports DG Lumbar Spine 2-3 Views  Result Date: 12/04/2019 CLINICAL DATA:  Lumbosacral back pain. EXAM: LUMBAR SPINE - 2-3 VIEW COMPARISON:  Reformats  from abdominal CT earlier today. FINDINGS: Normal alignment. Vertebral body heights are preserved. Significant disc space narrowing at L4-L5 and L5-S1. Facet hypertrophy at these levels. Multilevel endplate spurring of the upper lumbar spine with preservation of disc spaces. No fracture or focal lesion. Retained excreted IV contrast within dilated left renal collecting system as seen on CT earlier today. There is excreted IV contrast in the urinary bladder. IMPRESSION: 1. 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 No acute osseous abnormality. 2. Retained excreted IV contrast within dilated left renal collecting system as seen on CT earlier today. Electronically Signed   By: Keith Rake M.D.   On: 12/04/2019 23:48   CT Chest W Contrast  Result Date: 11/15/2019 CLINICAL DATA:   79 year old male with pneumonia. Concern for abscess or effusion. EXAM: CT CHEST, ABDOMEN, AND PELVIS WITH CONTRAST TECHNIQUE: Multidetector CT imaging of the chest, abdomen and pelvis was performed following the standard protocol during bolus administration of intravenous contrast. CONTRAST:  19mL OMNIPAQUE IOHEXOL 300 MG/ML  SOLN COMPARISON:  Chest CT dated 02/14/2018. Chest radiograph dated 11/15/2019. FINDINGS: CT CHEST FINDINGS Cardiovascular: There is no cardiomegaly or pericardial effusion. Advanced 3 vessel coronary vascular calcification. There is advanced calcified and noncalcified plaque of the thoracic aorta. No aneurysmal dilatation or dissection. The central pulmonary arteries appear patent. Mediastinum/Nodes: Right hilar adenopathy measuring 13 mm. Subcarinal lymph node measures 10 mm in short axis. The esophagus and the thyroid gland are grossly unremarkable. No mediastinal fluid collection. Lungs/Pleura: There is background of emphysema. Bibasilar subpleural reticulation as seen on the prior CT in keeping with interstitial lung disease. Overall slight progression of the fibrotic changes since the prior CT. There is a 2.6 x 2.7 cm cavitary nodule in the superior segment of the right lower lobe which may represent an abscess, an infected pneumatocele. Fungal infection, TB or cavitary neoplasm are not excluded. Clinical correlation and follow-up to resolution is recommended. Several scattered bilateral lower lobe nodular densities, present on the prior CT. No lobar consolidation, pleural effusion, or pneumothorax. The central airways are patent. Musculoskeletal: No chest wall mass or suspicious bone lesions identified. CT ABDOMEN PELVIS FINDINGS No intra-abdominal free air or free fluid. Hepatobiliary: There is fatty infiltration of the liver. Slight irregularity of the liver contour may represent early changes of cirrhosis. Elastography may provide better evaluation. There is tumefactive sludge  versus noncalcified stones within the gallbladder. There is diffuse gallbladder wall thickening and small pericholecystic fluid. Findings may be related to underlying liver disease although acute cholecystitis is not excluded. Further evaluation with right upper quadrant ultrasound recommended. Pancreas: Unremarkable. No pancreatic ductal dilatation or surrounding inflammatory changes. Spleen: Normal in size without focal abnormality. Adrenals/Urinary Tract: The adrenal glands are unremarkable. Multiple bilateral renal cysts with the largest a lobulated appearing left renal interpolar cyst measuring up to 13 cm in length. Several additional subcentimeter hypodensities are too small to characterize. There is no hydronephrosis on either side. There is symmetric enhancement and excretion of contrast by both kidneys. Mild bilateral perinephric stranding, nonspecific. Correlation with urinalysis recommended to exclude UTI. The visualized ureters and urinary bladder appear unremarkable. Stomach/Bowel: There is severe sigmoid diverticulosis without active inflammatory changes. There is no bowel obstruction or active inflammation. The appendix is normal. Vascular/Lymphatic: There is advanced aortoiliac atherosclerotic disease. 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 The graft appears patent. There is advanced atherosclerotic calcification of the common femoral arteries bilaterally, right greater left.  The IVC is unremarkable. No portal venous gas. There is no adenopathy. Reproductive: Prostatectomy. 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 Musculoskeletal: There is degenerative changes of the spine. No acute osseous pathology. IMPRESSION: 1. A 2.6 x 2.7 cm cavitary nodule in the superior segment of the right lower lobe may represent an abscess, an infected pneumatocele. Fungal infection, TB or cavitary neoplasm are not excluded. Clinical  correlation and follow-up to resolution is recommended. 2. Emphysema with slight progression of the interstitial lung disease since the prior CT. 3. Fatty liver with possible early changes of cirrhosis. Elastography may provide better evaluation. 4. Gallbladder tumefactive sludge versus noncalcified stones. Diffuse gallbladder wall thickening and small pericholecystic fluid may be related to underlying liver disease although acute cholecystitis is not excluded. Further evaluation with right upper quadrant ultrasound recommended. 5. Severe sigmoid diverticulosis. No bowel obstruction. Normal appendix. 6. Aortic Atherosclerosis (ICD10-I70.0) and Emphysema (ICD10-J43.9). Electronically Signed   By: Anner Crete M.D.   On: 11/15/2019 19:11   CT Angio Chest PE W and/or Wo Contrast  Result Date: 12/04/2019 CLINICAL DATA:  COVID pneumonia, dyspnea, productive cough, unspecified abdominal pain EXAM: CT ANGIOGRAPHY CHEST CT ABDOMEN AND PELVIS WITH CONTRAST TECHNIQUE: Multidetector CT imaging of the chest was performed using the standard protocol during bolus administration of intravenous contrast. Multiplanar CT image reconstructions and MIPs were obtained to evaluate the vascular anatomy. Multidetector CT imaging of the abdomen and pelvis was performed using the standard protocol during bolus administration of intravenous contrast. CONTRAST:  58mL OMNIPAQUE IOHEXOL 350 MG/ML SOLN COMPARISON:  11/15/2019 FINDINGS: CTA CHEST FINDINGS Cardiovascular: There is excellent opacification of the pulmonary arterial tree. There is no intraluminal filling defect to suggest acute pulmonary embolism. There is borderline enlargement of the central pulmonary arteries suggesting mild pulmonary arterial hypertension. There is extensive multi-vessel coronary artery calcification noted. Global cardiac size is within normal limits. Left ventricular hypertrophy is noted. There is mild calcification of the aortic valve leaflets noted. The  thoracic aorta demonstrates mild calcification within the aortic arch. Thoracic aorta is of normal caliber. Mediastinum/Nodes: Multiple pathologically enlarged right hilar lymph nodes are identifiedl measuring up to 19 mm in short axis diameter. Lungs/Pleura: Since the prior examination, multifocal ground-glass pulmonary infiltrates and peripheral areas of consolidation have significantly progressed in keeping with progressive atypical infection and compatible with progressive changes of COVID-19 pneumonia. There Isq, however, a focal cavitary nodule identified within the superior segment of the right lower lobe measuring 2.5 x 2.5 cm, similar to that noted on prior examination, with associated pathologic right hilar adenopathy. Differential considerations are unchanged, though malignancy is strongly considered given the associated pathologic right hilar adenopathy. No pneumothorax or pleural effusion. No central obstructing mass. Bronchial wall thickening is again identified in keeping with airway inflammation. Musculoskeletal: No chest wall abnormality. No acute or significant osseous findings. Review of the MIP images confirms the above findings. CT ABDOMEN and PELVIS FINDINGS Hepatobiliary: Layering high density material within the gallbladder lumen is in keeping with inspissated sludge or tiny gallstones. There is pneumobilia now identified within the biliary tree suggesting changes of interval sphincterotomy. No intra or extrahepatic biliary ductal dilation. The liver is unremarkable. Pancreas: Unremarkable Spleen: Unremarkable Adrenals/Urinary Tract: The adrenal glands are unremarkable. The kidneys are normal in size and position. There is mild left hydronephrosis to the level of the ureteropelvic junction with decompressive the ureter thereafter in keeping with a moderate left UPJ obstruction. Superimposed parapelvic and cortical simple cysts are identified.  There is symmetric enhancement of the left kidney  however. Multiple simple cortical cysts are seen within the right kidney. No hydronephrosis on the right. No renal or ureteral calculi. The bladder is unremarkable. Stomach/Bowel: Moderate sigmoid diverticulosis is identified without superimposed inflammatory change. Scattered diverticula are seen within the ascending colon. Stomach, small bowel, and large bowel are otherwise unremarkable. Appendix normal. No free intraperitoneal gas or fluid Vascular/Lymphatic: Moderate soft mural plaque is identified within the distal descending thoracic aorta at the diaphragmatic hiatus. 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 Extensive atherosclerotic calcification is seen within the terminal lower extremity arterial inflow and visualized outflow. There is no pathologic adenopathy within the abdomen and pelvis. Reproductive: Status post prostatectomy. Other: Rectum unremarkable. Musculoskeletal: No lytic or blastic bone lesions within the abdomen. Review of the MIP images confirms the above findings. IMPRESSION: 1. No evidence of acute pulmonary embolism. 2. Significant interval progression of multifocal ground-glass pulmonary infiltrates and peripheral areas of consolidation in keeping with progressive atypical infection and compatible with COVID-19 pneumonia. 3. Persistent cavitary nodule within the superior segment of the right lower lobe, with associated pathologic right hilar adenopathy. Differential considerations are unchanged, though malignancy is strongly considered given the associated pathologic right hilar adenopathy. Short-term follow-up imaging or PET-CT examination may be helpful once the patient's acute issues have resolved for further evaluation. 4. Interval probable sphincterotomy with pneumobilia. Cholelithiasis persists though superimposed pericholecystic inflammatory change has resolved. 5. Moderate left UPJ obstruction. 6. Diverticulosis without evidence of acute diverticulitis. Electronically  Signed   By: Fidela Salisbury MD   On: 12/04/2019 20:02   NM Hepatobiliary Liver Func  Result Date: 11/17/2019 CLINICAL DATA:  Nausea and vomiting. Cholelithiasis and choledocholithiasis. EXAM: NUCLEAR MEDICINE HEPATOBILIARY IMAGING TECHNIQUE: Sequential images of the abdomen were obtained out to 60 minutes following intravenous administration of radiopharmaceutical. RADIOPHARMACEUTICALS:  5.5 mCi Tc-90m  Choletec IV COMPARISON:  None. FINDINGS: Prompt uptake and biliary excretion of activity by the liver is seen. Gallbladder activity is visualized, consistent with patency of cystic duct. Biliary activity passes into small bowel, consistent with patent common bile duct. Reflux of biliary activity into the stomach is noted. IMPRESSION: Patency of both cystic and common bile ducts is demonstrated. Bile reflux noted. Electronically Signed   By: Marlaine Hind M.D.   On: 11/17/2019 15:15   CT ABDOMEN PELVIS W CONTRAST  Result Date: 12/04/2019 CLINICAL DATA:  COVID pneumonia, dyspnea, productive cough, unspecified abdominal pain EXAM: CT ANGIOGRAPHY CHEST CT ABDOMEN AND PELVIS WITH CONTRAST TECHNIQUE: Multidetector CT imaging of the chest was performed using the standard protocol during bolus administration of intravenous contrast. Multiplanar CT image reconstructions and MIPs were obtained to evaluate the vascular anatomy. Multidetector CT imaging of the abdomen and pelvis was performed using the standard protocol during bolus administration of intravenous contrast. CONTRAST:  78mL OMNIPAQUE IOHEXOL 350 MG/ML SOLN COMPARISON:  11/15/2019 FINDINGS: CTA CHEST FINDINGS Cardiovascular: There is excellent opacification of the pulmonary arterial tree. There is no intraluminal filling defect to suggest acute pulmonary embolism. There is borderline enlargement of the central pulmonary arteries suggesting mild pulmonary arterial hypertension. There is extensive multi-vessel coronary artery calcification noted. Global cardiac  size is within normal limits. Left ventricular hypertrophy is noted. There is mild calcification of the aortic valve leaflets noted. The thoracic aorta demonstrates mild calcification within the aortic arch. Thoracic aorta is of normal caliber. Mediastinum/Nodes: Multiple pathologically enlarged right hilar lymph nodes are identifiedl measuring up to 19 mm in short axis diameter. Lungs/Pleura: Since the  prior examination, multifocal ground-glass pulmonary infiltrates and peripheral areas of consolidation have significantly progressed in keeping with progressive atypical infection and compatible with progressive changes of COVID-19 pneumonia. There Isq, however, a focal cavitary nodule identified within the superior segment of the right lower lobe measuring 2.5 x 2.5 cm, similar to that noted on prior examination, with associated pathologic right hilar adenopathy. Differential considerations are unchanged, though malignancy is strongly considered given the associated pathologic right hilar adenopathy. No pneumothorax or pleural effusion. No central obstructing mass. Bronchial wall thickening is again identified in keeping with airway inflammation. Musculoskeletal: No chest wall abnormality. No acute or significant osseous findings. Review of the MIP images confirms the above findings. CT ABDOMEN and PELVIS FINDINGS Hepatobiliary: Layering high density material within the gallbladder lumen is in keeping with inspissated sludge or tiny gallstones. There is pneumobilia now identified within the biliary tree suggesting changes of interval sphincterotomy. No intra or extrahepatic biliary ductal dilation. The liver is unremarkable. Pancreas: Unremarkable Spleen: Unremarkable Adrenals/Urinary Tract: The adrenal glands are unremarkable. The kidneys are normal in size and position. There is mild left hydronephrosis to the level of the ureteropelvic junction with decompressive the ureter thereafter in keeping with a moderate  left UPJ obstruction. Superimposed parapelvic and cortical simple cysts are identified. There is symmetric enhancement of the left kidney however. Multiple simple cortical cysts are seen within the right kidney. No hydronephrosis on the right. No renal or ureteral calculi. The bladder is unremarkable. Stomach/Bowel: Moderate sigmoid diverticulosis is identified without superimposed inflammatory change. Scattered diverticula are seen within the ascending colon. Stomach, small bowel, and large bowel are otherwise unremarkable. Appendix normal. No free intraperitoneal gas or fluid Vascular/Lymphatic: Moderate soft mural plaque is identified within the distal descending thoracic aorta at the diaphragmatic hiatus. 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 Extensive atherosclerotic calcification is seen within the terminal lower extremity arterial inflow and visualized outflow. There is no pathologic adenopathy within the abdomen and pelvis. Reproductive: Status post prostatectomy. Other: Rectum unremarkable. Musculoskeletal: No lytic or blastic bone lesions within the abdomen. Review of the MIP images confirms the above findings. IMPRESSION: 1. No evidence of acute pulmonary embolism. 2. Significant interval progression of multifocal ground-glass pulmonary infiltrates and peripheral areas of consolidation in keeping with progressive atypical infection and compatible with COVID-19 pneumonia. 3. Persistent cavitary nodule within the superior segment of the right lower lobe, with associated pathologic right hilar adenopathy. Differential considerations are unchanged, though malignancy is strongly considered given the associated pathologic right hilar adenopathy. Short-term follow-up imaging or PET-CT examination may be helpful once the patient's acute issues have resolved for further evaluation. 4. Interval probable sphincterotomy with pneumobilia. Cholelithiasis persists though superimposed pericholecystic  inflammatory change has resolved. 5. Moderate left UPJ obstruction. 6. Diverticulosis without evidence of acute diverticulitis. Electronically Signed   By: Fidela Salisbury MD   On: 12/04/2019 20:02   CT Abdomen Pelvis W Contrast  Result Date: 11/15/2019 CLINICAL DATA:  79 year old male with pneumonia. Concern for abscess or effusion. EXAM: CT CHEST, ABDOMEN, AND PELVIS WITH CONTRAST TECHNIQUE: Multidetector CT imaging of the chest, abdomen and pelvis was performed following the standard protocol during bolus administration of intravenous contrast. CONTRAST:  144mL OMNIPAQUE IOHEXOL 300 MG/ML  SOLN COMPARISON:  Chest CT dated 02/14/2018. Chest radiograph dated 11/15/2019. FINDINGS: CT CHEST FINDINGS Cardiovascular: There is no cardiomegaly or pericardial effusion. Advanced 3 vessel coronary vascular calcification. There is advanced calcified and noncalcified plaque of the thoracic aorta. No aneurysmal dilatation or dissection. The central  pulmonary arteries appear patent. Mediastinum/Nodes: Right hilar adenopathy measuring 13 mm. Subcarinal lymph node measures 10 mm in short axis. The esophagus and the thyroid gland are grossly unremarkable. No mediastinal fluid collection. Lungs/Pleura: There is background of emphysema. Bibasilar subpleural reticulation as seen on the prior CT in keeping with interstitial lung disease. Overall slight progression of the fibrotic changes since the prior CT. There is a 2.6 x 2.7 cm cavitary nodule in the superior segment of the right lower lobe which may represent an abscess, an infected pneumatocele. Fungal infection, TB or cavitary neoplasm are not excluded. Clinical correlation and follow-up to resolution is recommended. Several scattered bilateral lower lobe nodular densities, present on the prior CT. No lobar consolidation, pleural effusion, or pneumothorax. The central airways are patent. Musculoskeletal: No chest wall mass or suspicious bone lesions identified. CT ABDOMEN  PELVIS FINDINGS No intra-abdominal free air or free fluid. Hepatobiliary: There is fatty infiltration of the liver. Slight irregularity of the liver contour may represent early changes of cirrhosis. Elastography may provide better evaluation. There is tumefactive sludge versus noncalcified stones within the gallbladder. There is diffuse gallbladder wall thickening and small pericholecystic fluid. Findings may be related to underlying liver disease although acute cholecystitis is not excluded. Further evaluation with right upper quadrant ultrasound recommended. Pancreas: Unremarkable. No pancreatic ductal dilatation or surrounding inflammatory changes. Spleen: Normal in size without focal abnormality. Adrenals/Urinary Tract: The adrenal glands are unremarkable. Multiple bilateral renal cysts with the largest a lobulated appearing left renal interpolar cyst measuring up to 13 cm in length. Several additional subcentimeter hypodensities are too small to characterize. There is no hydronephrosis on either side. There is symmetric enhancement and excretion of contrast by both kidneys. Mild bilateral perinephric stranding, nonspecific. Correlation with urinalysis recommended to exclude UTI. The visualized ureters and urinary bladder appear unremarkable. Stomach/Bowel: There is severe sigmoid diverticulosis without active inflammatory changes. There is no bowel obstruction or active inflammation. The appendix is normal. Vascular/Lymphatic: There is advanced aortoiliac atherosclerotic disease. 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 The graft appears patent. There is advanced atherosclerotic calcification of the common femoral arteries bilaterally, right greater left. The IVC is unremarkable. No portal venous gas. There is no adenopathy. Reproductive: Prostatectomy. 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 Musculoskeletal: There is degenerative  changes of the spine. No acute osseous pathology. IMPRESSION: 1. A 2.6 x 2.7 cm cavitary nodule in the superior segment of the right lower lobe may represent an abscess, an infected pneumatocele. Fungal infection, TB or cavitary neoplasm are not excluded. Clinical correlation and follow-up to resolution is recommended. 2. Emphysema with slight progression of the interstitial lung disease since the prior CT. 3. Fatty liver with possible early changes of cirrhosis. Elastography may provide better evaluation. 4. Gallbladder tumefactive sludge versus noncalcified stones. Diffuse gallbladder wall thickening and small pericholecystic fluid may be related to underlying liver disease although acute cholecystitis is not excluded. Further evaluation with right upper quadrant ultrasound recommended. 5. Severe sigmoid diverticulosis. No bowel obstruction. Normal appendix. 6. Aortic Atherosclerosis (ICD10-I70.0) and Emphysema (ICD10-J43.9). Electronically Signed   By: Anner Crete M.D.   On: 11/15/2019 19:11   US RENAL  Result Date: 11/18/2019 CLINICAL DATA:  Acute renal injury, COVID-19 positivity EXAM: RENAL / URINARY TRACT ULTRASOUND COMPLETE COMPARISON:  11/15/2019 FINDINGS: Right Kidney: Renal measurements: 12.3 x 5.1 x 4.3 cm. = volume: 140 mL. Less than 10 cysts are noted. The largest of  these measures 2.7 cm in the midportion of the right kidney. Left Kidney: Renal measurements: 14.3 x 5.7 x 5.5 cm. = volume: 233 mL. Less than 10 cysts are noted. The largest of these measures 7.3 cm in greatest dimension. These are stable from the prior CT examination Bladder: Appears normal for degree of bladder distention. Other: Gallbladder is partially distended with echogenic material within similar to that seen on prior exam consistent with gallbladder sludge and stones. IMPRESSION: Gallbladder sludge and stones. Bilateral renal cysts which are simple in nature. No obstructive changes are noted. Electronically Signed   By:  Inez Catalina M.D.   On: 11/18/2019 03:03   MR ABDOMEN MRCP WO CONTRAST  Result Date: 11/17/2019 CLINICAL DATA:  Cholelithiasis EXAM: MRI ABDOMEN WITHOUT CONTRAST  (INCLUDING MRCP) TECHNIQUE: Multiplanar multisequence MR imaging of the abdomen was performed. Heavily T2-weighted images of the biliary and pancreatic ducts were obtained, and three-dimensional MRCP images were rendered by post processing. COMPARISON:  CT chest, abdomen and pelvis of November 15, 2019 FINDINGS: Lower chest: Small effusions at the lung bases. As basilar airspace disease and parenchymal findings demonstrated on the recent chest CT are not well evaluated on the MRI. Hepatobiliary: Mild hepatic steatosis. No focal, suspicious hepatic lesion, assessment limited by the noncontrast imaging. Pericholecystic stranding and signs of wall thickening. Mild biliary duct distension with numerous filling defects in the common bile duct compatible with multiple small stones. Largest approximately 5 mm. At least 4-5 additional small biliary calculi in the common bile duct. Pancreas:  No ductal dilation.  No peripancreatic inflammation. Spleen: Skin normal in size and contour without focal, suspicious lesion. Adrenals/Urinary Tract:  Adrenal glands are normal spleen. Bilateral renal cysts. Asymmetric albeit slightly Peri nephric stranding greater on the LEFT than the RIGHT. Dilated collecting systems in the upper pole and in the lower pole LEFT kidney perhaps due to mass-effect upon collecting systems in the setting of large renal sinus cysts with similar appearance to prior imaging studies. Stomach/Bowel: Stomach gastrointestinal tract with limited assessment, unremarkable to the extent evaluated. 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 Other:  None. Musculoskeletal: No suspicious bone lesions identified. IMPRESSION: 1. Choledocholithiasis with mild biliary duct dilation as  described. 2. Pericholecystic stranding and signs of wall thickening, remaining suspicious for acute cholecystitis. If there are discordant clinical findings, HIDA scan may be helpful for further assessment. 3. Renal sinus cysts and stable collecting system dilation on the LEFT with renal cysts on the RIGHT and mild perinephric stranding, nonspecific and similar to previous imaging studies. 4. Small effusions at the lung bases. 5. Mild hepatic steatosis. Electronically Signed   By: Zetta Bills M.D.   On: 11/17/2019 08:06   DG Chest Portable 1 View  Result Date: 12/04/2019 CLINICAL DATA:  Shortness of breath EXAM: PORTABLE CHEST 1 VIEW COMPARISON:  11/16/2019 FINDINGS: Patchy peripheral airspace opacities throughout the right lung and in the left lower lobe concerning for pneumonia. Heart is normal size. No effusions or pneumothorax. No acute bony abnormality. IMPRESSION: Patchy peripheral airspace opacities, right greater than left concerning for pneumonia, possibly COVID pneumonia. Electronically Signed   By: Rolm Baptise M.D.   On: 12/04/2019 18:30   DG Chest Port 1 View  Result Date: 11/16/2019 CLINICAL DATA:  Shortness of breath and fever EXAM: PORTABLE CHEST 1 VIEW COMPARISON:  Chest radiograph and chest CT November 15, 2019 FINDINGS: There is a degree of underlying fibrosis. The cavitary lesion in  the right lower lobe seen 1 day prior is less well seen by radiography although is evident, measuring 2.5 x 2.4 cm. There is a degree of lower lobe bronchiectatic change. No new opacity evident. Heart size and pulmonary vascularity are normal. Lymph node prominence seen on CT is not appreciable by radiography. There is aortic atherosclerosis. No bone lesions appreciable. IMPRESSION: Cavitary lesion right lower lobe, much better seen on recent CT. This nodular lesion measures 2.5 x 2.4 cm on portable radiographic examination. There is an underlying degree of fibrosis in the bases. No new opacity evident.  Cardiac silhouette stable. Aortic Atherosclerosis (ICD10-I70.0). Electronically Signed   By: Lowella Grip III M.D.   On: 11/16/2019 09:34   DG Chest Port 1 View  Result Date: 11/15/2019 CLINICAL DATA:  Evaluate for infection. EXAM: PORTABLE CHEST 1 VIEW COMPARISON:  07/27/2015 FINDINGS: Lungs are adequately inflated without effusion or pneumothorax. Mild increased density over the right hilum/perihilar region. Cardiomediastinal silhouette and remainder the exam is unchanged. IMPRESSION: Minimal increased density over the right hilar/perihilar region. Medial airspace process is possible. Consider PA and lateral chest radiograph for better evaluation. Electronically Signed   By: Marin Olp M.D.   On: 11/15/2019 17:04   DG ERCP BILIARY & PANCREATIC DUCTS  Result Date: 11/19/2019 CLINICAL DATA:  ERCP for bile duct stones. EXAM: ERCP TECHNIQUE: Multiple spot images obtained with the fluoroscopic device and submitted for interpretation post-procedure. COMPARISON:  MRCP-11/17/2019; nuclear medicine HIDA scan-11/17/2019 FINDINGS: Eight spot intraoperative fluoroscopic images of the right upper abdominal quadrant during ERCP are provided for review Initial image demonstrates an ERCP probe overlying the right upper abdominal quadrant Subsequent images demonstrate selective cannulation opacification of the common bile duct. There are several filling defects within the CBD, potentially representative of air bubbles versus choledocholithiasis. Subsequent images demonstrate insufflation of a balloon within the central aspect of the CBD with subsequent biliary sweeping and presumed sphincterotomy. There is minimal opacification the cystic duct with passage of contrast to the level of the gallbladder lumen. There is minimal opacification intrahepatic biliary tree which appears nondilated. IMPRESSION: ERCP with biliary sweeping and presumed sphincterotomy as above. These images were submitted for radiologic  interpretation only. Please see the procedural report for the amount of contrast and the fluoroscopy time utilized. Electronically Signed   By: Sandi Mariscal M.D.   On: 11/19/2019 07:40   VAS [place] LOWER EXTREMITY VENOUS (DVT)  Result Date: 12/07/2019  Lower Venous DVTStudy Indications: Elevated ddimer.  Comparison Study: 11/20/19 previous Performing Technologist: Abram Sander RVS  Examination Guidelines: A complete evaluation includes B-mode imaging, spectral Doppler, color Doppler, and power Doppler as needed of all accessible portions of each vessel. Bilateral testing is considered an integral part of a complete examination. Limited examinations for reoccurring indications may be performed as noted. The reflux portion of the exam is performed with the patient in reverse Trendelenburg.  +---------+---------------+---------+-----------+----------+--------------+ RIGHT    CompressibilityPhasicitySpontaneityPropertiesThrombus Aging +---------+---------------+---------+-----------+----------+--------------+ CFV      Full           Yes      Yes                                 +---------+---------------+---------+-----------+----------+--------------+ SFJ      Full                                                        +---------+---------------+---------+-----------+----------+--------------+  FV Prox  Full                                                        +---------+---------------+---------+-----------+----------+--------------+ FV Mid   Full                                                        +---------+---------------+---------+-----------+----------+--------------+ FV DistalFull                                                        +---------+---------------+---------+-----------+----------+--------------+ PFV      Full                                                        +---------+---------------+---------+-----------+----------+--------------+ POP       Full           Yes      Yes                                 +---------+---------------+---------+-----------+----------+--------------+ PTV      Full                                                        +---------+---------------+---------+-----------+----------+--------------+ PERO     Full                                                        +---------+---------------+---------+-----------+----------+--------------+   +---------+---------------+---------+-----------+----------+--------------+ LEFT     CompressibilityPhasicitySpontaneityPropertiesThrombus Aging +---------+---------------+---------+-----------+----------+--------------+ CFV      Full           Yes      Yes                                 +---------+---------------+---------+-----------+----------+--------------+ SFJ      Full                                                        +---------+---------------+---------+-----------+----------+--------------+ FV Prox  Full                                                        +---------+---------------+---------+-----------+----------+--------------+  FV Mid   Full                                                        +---------+---------------+---------+-----------+----------+--------------+ FV DistalFull                                                        +---------+---------------+---------+-----------+----------+--------------+ PFV      Full                                                        +---------+---------------+---------+-----------+----------+--------------+ POP      Full           Yes      Yes                                 +---------+---------------+---------+-----------+----------+--------------+ PTV      Full                                                        +---------+---------------+---------+-----------+----------+--------------+ PERO     Full                                                         +---------+---------------+---------+-----------+----------+--------------+     Summary: BILATERAL: - No evidence of deep vein thrombosis seen in the lower extremities, bilaterally. - No evidence of superficial venous thrombosis in the lower extremities, bilaterally. -   *See table(s) above for measurements and observations. Electronically signed by Monica Martinez MD on 12/07/2019 at 3:44:25 PM.    Final    VAS [place] LOWER EXTREMITY VENOUS (DVT)  Result Date: 11/21/2019  Lower Venous DVTStudy Indications: Elevated d-dimer.  Comparison Study: 07-27-2015 LEV study Performing Technologist: Darlin Coco  Examination Guidelines: A complete evaluation includes B-mode imaging, spectral Doppler, color Doppler, and power Doppler as needed of all accessible portions of each vessel. Bilateral testing is considered an integral part of a complete examination. Limited examinations for reoccurring indications may be performed as noted. The reflux portion of the exam is performed with the patient in reverse Trendelenburg.  +---------+---------------+---------+-----------+----------+--------------+ RIGHT    CompressibilityPhasicitySpontaneityPropertiesThrombus Aging +---------+---------------+---------+-----------+----------+--------------+ CFV      Full           Yes      Yes                                 +---------+---------------+---------+-----------+----------+--------------+ SFJ      Full           Yes  Yes                                 +---------+---------------+---------+-----------+----------+--------------+ FV Prox  Full                                                        +---------+---------------+---------+-----------+----------+--------------+ FV Mid   Full                                                        +---------+---------------+---------+-----------+----------+--------------+ FV DistalFull                                                         +---------+---------------+---------+-----------+----------+--------------+ PFV      Full                                                        +---------+---------------+---------+-----------+----------+--------------+ POP      Full           Yes      Yes                                 +---------+---------------+---------+-----------+----------+--------------+ PTV      Full                                                        +---------+---------------+---------+-----------+----------+--------------+ PERO     Full                                                        +---------+---------------+---------+-----------+----------+--------------+   +---------+---------------+---------+-----------+----------+--------------+ LEFT     CompressibilityPhasicitySpontaneityPropertiesThrombus Aging +---------+---------------+---------+-----------+----------+--------------+ CFV      Full           Yes      Yes                                 +---------+---------------+---------+-----------+----------+--------------+ SFJ      Full           Yes      Yes                                 +---------+---------------+---------+-----------+----------+--------------+ FV Prox  Full                                                        +---------+---------------+---------+-----------+----------+--------------+  FV Mid   Full                                                        +---------+---------------+---------+-----------+----------+--------------+ FV DistalFull                                                        +---------+---------------+---------+-----------+----------+--------------+ PFV      Full                                                        +---------+---------------+---------+-----------+----------+--------------+ POP      Full           Yes      Yes                                  +---------+---------------+---------+-----------+----------+--------------+ PTV      Full                                                        +---------+---------------+---------+-----------+----------+--------------+ PERO     Full                                                        +---------+---------------+---------+-----------+----------+--------------+     Summary: RIGHT: - There is no evidence of deep vein thrombosis in the lower extremity.  - No cystic structure found in the popliteal fossa.  LEFT: - There is no evidence of deep vein thrombosis in the lower extremity.  - No cystic structure found in the popliteal fossa.  *See table(s) above for measurements and observations. Electronically signed by [name] MD on 11/21/2019 at 4:19:57 AM.    Final    ECHOCARDIOGRAM LIMITED  Result Date: 11/16/2019    ECHOCARDIOGRAM LIMITED REPORT   Patient Name:   DACIAN ORRICO Date of Exam: 11/16/2019 Medical Rec #:  [id number]         Height:       70.0 in Accession #:    [id number]        Weight:       187.8 lb Date of Birth:  10-22-1940         BSA:          2.032 m Patient Age:    20 years          BP:           103/63 mmHg Patient Gender: M                 HR:           58 bpm.  Exam Location:  Inpatient Procedure: Limited Echo, Color Doppler and Cardiac Doppler Indications:    CAD Native Vessel i25.10  History:        Patient has prior history of Echocardiogram examinations, most                 recent 07/29/2015. CAD; Risk Factors:Hypertension, Diabetes and                 Dyslipidemia. COVID+ at time of study.  Sonographer:    Raquel Sarna Senior RDCS Referring Phys: [place]  1. Left ventricular ejection fraction, by estimation, is 55 to 60%. The left ventricle has normal function. The left ventricle has no regional wall motion abnormalities. Left ventricular diastolic function could not be evaluated.  2. Right ventricular systolic function is normal. The right  ventricular size is normal. There is mildly elevated pulmonary artery systolic pressure. The estimated right ventricular systolic pressure is 54.2 mmHg.  3. The mitral valve is grossly normal. Mild mitral valve regurgitation. No evidence of mitral stenosis.  4. The aortic valve is tricuspid. Aortic valve regurgitation is trivial. Mild aortic valve stenosis. Aortic valve mean gradient measures 9.9 mmHg. Aortic valve Vmax measures 2.15 m/s.  5. The inferior vena cava is dilated in size with <50% respiratory variability, suggesting right atrial pressure of 15 mmHg. Comparison(s): No significant change from prior study. FINDINGS  Left Ventricle: Left ventricular ejection fraction, by estimation, is 55 to 60%. The left ventricle has normal function. The left ventricle has no regional wall motion abnormalities. The left ventricular internal cavity size was normal in size. There is  no left ventricular hypertrophy. Right Ventricle: The right ventricular size is normal. No increase in right ventricular wall thickness. Right ventricular systolic function is normal. There is mildly elevated pulmonary artery systolic pressure. The tricuspid regurgitant velocity is 2.63  m/s, and with an assumed right atrial pressure of 15 mmHg, the estimated right ventricular systolic pressure is 70.6 mmHg. Left Atrium: Left atrial size was normal in size. Right Atrium: Right atrial size was normal in size. Pericardium: Trivial pericardial effusion is present. Presence of pericardial fat pad. Mitral Valve: The mitral valve is grossly normal. Mild mitral valve regurgitation. No evidence of mitral valve stenosis. Tricuspid Valve: The tricuspid valve is grossly normal. Tricuspid valve regurgitation is trivial. No evidence of tricuspid stenosis. Aortic Valve: The aortic valve is tricuspid. Aortic valve regurgitation is trivial. Mild aortic stenosis is present. Aortic valve mean gradient measures 9.9 mmHg. Aortic valve peak gradient measures 18.4  mmHg. Aortic valve area, by VTI measures 1.26 cm. Pulmonic Valve: The pulmonic valve was grossly normal. Pulmonic valve regurgitation is trivial. No evidence of pulmonic stenosis. Aorta: The aortic root is normal in size and structure. Venous: The inferior vena cava is dilated in size with less than 50% respiratory variability, suggesting right atrial pressure of 15 mmHg. IAS/Shunts: The atrial septum is grossly normal. LEFT VENTRICLE PLAX 2D LVOT diam:     2.10 cm LV SV:         64 LV SV Index:   31 LVOT Area:     3.46 cm  LV Volumes (MOD) LV vol d, MOD A4C: 145.0 ml LV vol s, MOD A4C: 67.7 ml LV SV MOD A4C:     145.0 ml RIGHT VENTRICLE RV S prime:     9.14 cm/s TAPSE (M-mode): 2.3 cm AORTIC VALVE AV Area (Vmax):    1.11 cm AV Area (Vmean):   1.14 cm AV  Area (VTI):     1.26 cm AV Vmax:           214.63 cm/s AV Vmean:          148.039 cm/s AV VTI:            0.506 m AV Peak Grad:      18.4 mmHg AV Mean Grad:      9.9 mmHg LVOT Vmax:         68.50 cm/s LVOT Vmean:        48.900 cm/s LVOT VTI:          0.184 m LVOT/AV VTI ratio: 0.36 TRICUSPID VALVE TR Peak grad:   27.7 mmHg TR Vmax:        263.00 cm/s  SHUNTS Systemic VTI:  0.18 m Systemic Diam: 2.10 cm Eleonore Chiquito MD Electronically signed by Eleonore Chiquito MD Signature Date/Time: 11/16/2019/12:16:24 PM    Final    US Abdomen Limited RUQ  Result Date: 11/15/2019 CLINICAL DATA:  79 year old male with nausea vomiting. EXAM: ULTRASOUND ABDOMEN LIMITED RIGHT UPPER QUADRANT COMPARISON:  CT abdomen pelvis dated 11/15/2019. FINDINGS: Gallbladder: There is sludge and stone within the gallbladder. There is diffuse gallbladder wall thickening and edema and a small pericholecystic fluid. Evaluation for sonographic Murphy's sign was limited as the patient was pre-medicated. Common bile duct: Diameter: 7 mm Liver: There is diffuse increased liver echogenicity most commonly seen in the setting of fatty infiltration. Superimposed inflammation or fibrosis is not excluded.  Clinical correlation is recommended. Portal vein is patent on color Doppler imaging with normal direction of blood flow towards the liver. Other: None. IMPRESSION: 1. Gallbladder sludge and stones with diffuse edema as seen on the earlier CT. A hepatobiliary scintigraphy may provide better evaluation of the gallbladder if there is a high clinical concern for acute cholecystitis . 2. Fatty liver. Electronically Signed   By: Anner Crete M.D.   On: 11/15/2019 21:02      Phillips Climes M.D on 12/08/2019 at 11:34 AM    [facility] -  Office  [phone]

## 2019-12-09 ENCOUNTER — Encounter: Payer: Medicare Other | Admitting: Vascular Surgery

## 2019-12-09 LAB — CULTURE, BLOOD (ROUTINE X 2)
Culture: NO GROWTH
Special Requests: ADEQUATE

## 2019-12-09 LAB — COMPREHENSIVE METABOLIC PANEL WITH GFR
ALT: 14 U/L (ref 0–44)
AST: 14 U/L — ABNORMAL LOW (ref 15–41)
Albumin: 2.3 g/dL — ABNORMAL LOW (ref 3.5–5.0)
Alkaline Phosphatase: 33 U/L — ABNORMAL LOW (ref 38–126)
Anion gap: 9 (ref 5–15)
BUN: 35 mg/dL — ABNORMAL HIGH (ref 8–23)
CO2: 28 mmol/L (ref 22–32)
Calcium: 9.4 mg/dL (ref 8.9–10.3)
Chloride: 100 mmol/L (ref 98–111)
Creatinine, Ser: 1.18 mg/dL (ref 0.61–1.24)
GFR calc Af Amer: >60 mL/min
GFR calc non Af Amer: 60 mL/min — ABNORMAL LOW
Glucose, Bld: 261 mg/dL — ABNORMAL HIGH (ref 70–99)
Potassium: 4.7 mmol/L (ref 3.5–5.1)
Sodium: 137 mmol/L (ref 135–145)
Total Bilirubin: 0.4 mg/dL (ref 0.3–1.2)
Total Protein: 5.6 g/dL — ABNORMAL LOW (ref 6.5–8.1)

## 2019-12-09 LAB — CBC WITH DIFFERENTIAL/PLATELET
Abs Immature Granulocytes: 0.05 K/uL (ref 0.00–0.07)
Basophils Absolute: 0 K/uL (ref 0.0–0.1)
Basophils Relative: 0 %
Eosinophils Absolute: 0 K/uL (ref 0.0–0.5)
Eosinophils Relative: 0 %
HCT: 25.9 % — ABNORMAL LOW (ref 39.0–52.0)
Hemoglobin: 8.2 g/dL — ABNORMAL LOW (ref 13.0–17.0)
Immature Granulocytes: 1 %
Lymphocytes Relative: 7 %
Lymphs Abs: 0.5 K/uL — ABNORMAL LOW (ref 0.7–4.0)
MCH: 30.4 pg (ref 26.0–34.0)
MCHC: 31.7 g/dL (ref 30.0–36.0)
MCV: 95.9 fL (ref 80.0–100.0)
Monocytes Absolute: 0.4 K/uL (ref 0.1–1.0)
Monocytes Relative: 6 %
Neutro Abs: 5.5 K/uL (ref 1.7–7.7)
Neutrophils Relative %: 86 %
Platelets: 212 K/uL (ref 150–400)
RBC: 2.7 MIL/uL — ABNORMAL LOW (ref 4.22–5.81)
RDW: 15 % (ref 11.5–15.5)
WBC: 6.4 K/uL (ref 4.0–10.5)
nRBC: 0.5 % — ABNORMAL HIGH (ref 0.0–0.2)

## 2019-12-09 LAB — GLUCOSE, CAPILLARY
Glucose-Capillary: 184 mg/dL — ABNORMAL HIGH (ref 70–99)
Glucose-Capillary: 226 mg/dL — ABNORMAL HIGH (ref 70–99)

## 2019-12-09 LAB — MAGNESIUM: Magnesium: 1.8 mg/dL (ref 1.7–2.4)

## 2019-12-09 LAB — C-REACTIVE PROTEIN: CRP: 1.3 mg/dL — ABNORMAL HIGH

## 2019-12-09 MED ORDER — FLUCONAZOLE 100 MG PO TABS
400.0000 mg | ORAL_TABLET | Freq: Every day | ORAL | Status: AC
  Administered 2019-12-09: 400 mg via ORAL
  Filled 2019-12-09: qty 4

## 2019-12-09 MED ORDER — FLUCONAZOLE 200 MG PO TABS: 200.0000 mg | ORAL_TABLET | Freq: Every day | ORAL | 0 refills | Status: DC

## 2019-12-09 MED ORDER — AMLODIPINE BESYLATE 5 MG PO TABS
5.0000 mg | ORAL_TABLET | Freq: Every day | ORAL | 0 refills | Status: DC
Start: 2019-12-09 — End: 2019-12-17

## 2019-12-09 MED ORDER — PANTOPRAZOLE SODIUM 40 MG PO TBEC: 40.0000 mg | DELAYED_RELEASE_TABLET | Freq: Two times a day (BID) | ORAL | 1 refills | Status: AC

## 2019-12-09 MED ORDER — PREDNISONE 10 MG PO TABS
ORAL_TABLET | ORAL | 0 refills | Status: DC
Start: 2019-12-09 — End: 2019-12-17

## 2019-12-09 MED ORDER — GLUCERNA SHAKE PO LIQD: 237.0000 mL | Freq: Three times a day (TID) | ORAL | 0 refills | Status: AC

## 2019-12-09 MED ORDER — FLUCONAZOLE 100 MG PO TABS: 200.0000 mg | ORAL_TABLET | Freq: Every day | ORAL | Status: DC

## 2019-12-09 NOTE — Progress Notes (Signed)
SATURATION QUALIFICATIONS: (This note is used to comply with regulatory documentation for home oxygen)  Patient Saturations on Room Air at Rest = 97%  Patient Saturations on Room Air while Ambulating = 90%  Patient Saturations on 0 Liters of oxygen while Ambulating = 90%  Please briefly explain why patient needs home oxygen:Patient does not appear to need home oxygen. Saturations dropped as low as 90% on RA.

## 2019-12-09 NOTE — Care Management Important Message (Signed)
Important Message  Patient Details  Name: URIE LOUGHNER MRN: [id number] Date of Birth: Sep 05, 1940   Medicare Important Message Given:  Yes   Patient left prior to IM delivery.  IM mailed to the patient address.    Tildon Silveria 12/09/2019, 3:42 PM

## 2019-12-09 NOTE — TOC Initial Note (Signed)
Transition of Care [facility]) - Initial/Assessment Note    Patient Details  Name: Cody Moss MRN: [id number] Date of Birth: [birth year]  [facility] CM/SW Contact:    Joanne Chars, LCSW Phone Number: 12/09/2019, 2:06 PM  Clinical Narrative:   CSW met with pt and daughter to discuss recommendation for [facility].  Pt agreeable, choice document offered and explained.  Daughter requests [facility], [facility] or [facility].  Pt reports he has walker and shower bench at home, does not need additional equipment.  PCP Dr Derrill Memo.  Daughter asked about 76, however pt passed his screening.  When informed that insurance would not cover 02 pt and daughter declined.                  Expected Discharge Plan: [place] Barriers to Discharge: No Barriers Identified   Patient Goals and CMS Choice Patient states their goals for this hospitalization and ongoing recovery are:: get back to 100% CMS Medicare.gov Compare Post Acute Care list provided to:: Patient Choice offered to / list presented to : Patient  Expected Discharge Plan and Services Expected Discharge Plan: [place] Choice: [place] arrangements for the past 2 months: Single Family Home Expected Discharge Date: 12/09/19                         HH Arranged: OT, PT HH Agency: [place] Date [facility] Agency Contacted: 12/09/19 Time HH Agency Contacted: 45 Representative spoke with at [place]: Tommi Rumps  Prior Living Arrangements/Services Living arrangements for the past 2 months: [place] Lives with:: Spouse, Relatives Patient language and need for interpreter reviewed:: Yes Do you feel safe going back to the place where you live?: Yes      Need for Family Participation in Patient Care: No (Comment) Care giver support system in place?: Yes (comment)   Criminal Activity/Legal Involvement Pertinent to Current Situation/Hospitalization: No -  Comment as needed  Activities of Daily Living Home Assistive Devices/Equipment: None ADL Screening (condition at time of admission) Patient's cognitive ability adequate to safely complete daily activities?: Yes Is the patient deaf or have difficulty hearing?: No Does the patient have difficulty seeing, even when wearing glasses/contacts?: No Does the patient have difficulty concentrating, remembering, or making decisions?: No Patient able to express need for assistance with ADLs?: Yes Does the patient have difficulty dressing or bathing?: No Independently performs ADLs?: No Toileting: Needs assistance Is this a change from baseline?: Change from baseline, expected to last <3 days In/Out Bed: Needs assistance Is this a change from baseline?: Change from baseline, expected to last <3 days Does the patient have difficulty walking or climbing stairs?: No Weakness of Legs: Both Weakness of Arms/Hands: None  Permission Sought/Granted Permission sought to share information with : Facility Sport and exercise psychologist, Family Supports Permission granted to share information with : Yes, Verbal Permission Granted  Share Information with NAME: Melissa, daughger  Permission granted to share info w AGENCY: HH        Emotional Assessment Appearance:: Appears stated age Attitude/Demeanor/Rapport: Engaged Affect (typically observed): Pleasant Orientation: : Oriented to Self, Oriented to Place, Oriented to  Time, Oriented to Situation Alcohol / Substance Use: Not Applicable Psych Involvement: No (comment)  Admission diagnosis:  Low back pain [M54.5] Acute respiratory failure with hypoxia ([place]) [J96.01] COVID-19 [U07.1] Patient Active Problem List   Diagnosis Date Noted  .  Acute respiratory failure with hypoxia ([place]) 12/04/2019  . Uncontrolled type 2 diabetes mellitus with hyperglycemia, without long-term current use of insulin ([place]) 12/04/2019  . GERD without esophagitis 12/04/2019  . Mixed  diabetic hyperlipidemia associated with type 2 diabetes mellitus ([place]) 12/04/2019  . Dehydration 12/04/2019  . Hyperkalemia 12/04/2019  . Lactic acidosis 12/04/2019  . Lumbar back pain 12/04/2019  . Bile duct stone   . Pneumonia due to COVID-19 virus   . Choledocholithiasis with acute cholecystitis with obstruction   . Bacteremia   . Acute abdominal pain 11/15/2019  . Cavitating mass in right lower lung lobe 11/15/2019  . Abdominal pain 11/15/2019  . Multiple pulmonary nodules determined by computed tomography of lung 02/06/2017  . 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 AKI (acute kidney injury) ([place]) 07/24/2015  . Falls 07/24/2015  . Hyponatremia 07/24/2015  . Status post lumbar surgery 07/24/2015  . Fall   . Preoperative cardiovascular examination 07/13/2015  . S/P AAA repair   . S/P angioplasty with stent   . Peripheral vascular disease ([place])   . Coronary artery disease   . Essential hypertension   . S/P AAA (abdominal aortic aneurysm) repair 12/11/2013  . Coronary artery disease involving native coronary artery of native heart without angina pectoris 05/06/2013  . Gout 05/06/2013  . Hyperlipidemia LDL goal <70 05/06/2013  . Prostate cancer ([place])   . Hx of radiation therapy    PCP:  [name], MD Pharmacy:   [facility] [address] [place]), [place] [address]  ([place]) [place] [address] Phone: [phone] Fax: [phone]     Social Determinants of Health (SDOH) Interventions    Readmission Risk Interventions No flowsheet data found.

## 2019-12-09 NOTE — Discharge Summary (Addendum)
PATIENT DETAILS Name: Cody Moss Age: 79 y.o. Sex: male Date of Birth: November 27, 1940 MRN: [id number]. Admitting Physician: Vernelle Emerald, MD FIE:PPIR, Nathen May, MD  Admit Date: 12/04/2019 Discharge date: 12/09/2019  Recommendations for Outpatient Follow-up:  1. Follow up with PCP in 1-2 weeks 2. Please obtain CMP/CBC in one week 3. Repeat Chest Xray in 4-6 week  Admitted From:  Home  Disposition: Home with home health [place]:  Yes  Equipment/Devices: None  Discharge Condition: Stable  CODE STATUS: DNR  Diet recommendation:  Diet Order            Diet - low sodium heart healthy           Diet heart healthy/carb modified Room service appropriate? Yes; Fluid consistency: Thin  Diet effective now                  Brief Summary: See H&P, Labs, Consult and Test reports for all details in brief, patient is a 79 year old male with history of gout, HTN, DM-2, pulmonary fibrosis, CAD s/p PCI, GERD, HLD-recently hospitalized from 7/25-7/34 COVID-19 infection, cavitary lesion of the right lung, E. coli bacteremia and suspected cholangitis/choledocholithiasis-patient underwent ERCP/sphincterotomy with stone extraction-patient was eventually discharged on Augmentin/Zithromax-since discharge-patient continued to have some cough with productive phlegm-he was subsequently admitted to the emergency room-for ongoing Covid pneumonitis-and was also found to have upper GI bleeding with acute blood loss anemia.  See below for further details.  Brief Hospital Course: Acute Hypoxic Respiratory Failure secondary to COVID-19 pneumonia with inflammation/lung fibrosis-existing cavitary lung lesion: Hypoxia has improved-he was treated with steroids-he was s ambulated by the nursing staff-does not require home O2.  O2 sats are stable on room air.  He was managed with steroids and supportive care-on discharge will continue steroids for a few more days..  He has completed a  course of antimicrobial therapy for his cavitary lesion and Covid pneumonitis.  Note-no evidence of sepsis-clinical features not consistent with sepsis physiology.  Since patient is> 21 days from his initial positive test-patient does not require any further isolation.  Prior MD spoke with infectious disease-see prior documentation.  COVID-19 Labs:  Recent Labs    12/07/19 0103 12/08/19 0108 12/09/19 0500  DDIMER 2.55*  --   --   CRP 3.2* 1.7* 1.3*    Lab Results  Component Value Date   SARSCOV2NAA POSITIVE (A) 12/07/2019   SARSCOV2NAA POSITIVE (A) 11/15/2019   [place] Not Detected 07/17/2019   [place] NEGATIVE 05/01/2019     Upper GI bleeding with acute blood loss anemia: Did have melena-but has resolved-brown stools on the day of discharge.  Never required PRBC transfusion-hemoglobin fluctuating but relatively stable.  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  Recommendations from GI are to continue Protonix twice daily for 2 months-then daily indefinitely-and to continue Diflucan for total of 2 weeks.  GI will arrange for outpatient follow-up.  History of cavitary mass in the right lung: Completed a course of antimicrobial therapy-patient is aware that he needs to follow-up with his primary pulmonologist for outpatient PET/CT scan.  Prior MD discussed with patient and daughter-they understand the importance of follow-up to rule out malignancy.  This MD this morning reemphasized importance of follow-up with the patient.  History of choledocholithiasis-s/p ERCP: Patient/family  aware that patient is going to require outpatient surgery follow-up.  Mildly elevated D-dimer: CTA chest/lower extremity Doppler negative for VTE.  Given GI  bleeding-not a candidate for VTE prophylaxis.  Patient encouraged to ambulate.  CAD: No anginal symptoms-continue to hold aspirin due to GI bleeding-resume when okay with GI MD.  HTN: BP relatively stable-Avapro was discontinued due to hyperkalemia-we will switch to amlodipine on discharge.  Further optimization deferred to PCP.  DM-2: CBGs relatively stable-we will resume oral hypoglycemic agents on discharge.  Moderate left UPJ obstruction: Prior MD discussed with urologist-Dr. Particia Jasper reviewed CT imaging-no changes from his recent scan.  Recommendations were for outpatient follow-up with his primary urologist-Dr. Diona Fanti.  RN pressure injury documentation: Pressure Injury 12/05/19 Buttocks Right (Active)  12/05/19   Location: Buttocks  Location Orientation: Right  Staging:   Wound Description (Comments):   Present on Admission:     Note-spoke with patient's daughter on the day of discharge-family has already arranged for follow-up with pulmonology, surgery and urology.   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   Abdominal pain   Pneumonia due to COVID-19 virus   Uncontrolled type 2 diabetes mellitus with hyperglycemia, without long-term current use of insulin (HCC)   GERD without esophagitis   Mixed diabetic hyperlipidemia associated with type 2 diabetes mellitus (HCC)   Dehydration   Hyperkalemia   Lactic acidosis   Lumbar back pain   Discharge Instructions:    Person Under Monitoring Name: Cody Moss  Location: [place] [address]   Infection Prevention Recommendations for Individuals Confirmed to have, or Being Evaluated for, 2019 Novel Coronavirus (COVID-19) Infection Who Receive Care at  Home  Individuals who are confirmed to have, or are being evaluated for, COVID-19 should follow the prevention steps below until a healthcare provider or local or state health department says they can return to normal activities.  Stay home except to get medical care You should restrict activities outside your home, except for getting medical care. Do not go to work, school, or public areas, and do not use public transportation or taxis.  Call ahead before visiting your doctor Before your medical appointment, call the healthcare provider and tell them that you have, or are being evaluated for, COVID-19 infection. This will help the healthcare provider's office take steps to keep other people from getting infected. Ask your healthcare provider to call the local or state health department.  Monitor your symptoms Seek prompt medical attention if your illness is worsening (e.g., difficulty breathing). Before going to your medical appointment, call the healthcare provider and tell them that you have, or are being evaluated for, COVID-19 infection. Ask your healthcare provider to call the local or state health department.  Wear a facemask You should wear a facemask that covers your nose and mouth when you are in the same room with other people and when you visit a healthcare provider. People who live with or visit you should also wear a facemask while they are in the same room with you.  Separate yourself from other people in your home As much as possible, you should stay in a different room from other people in your home. Also, you should use a separate bathroom, if available.  Avoid sharing household items  You should not share dishes, drinking glasses, cups, eating utensils, towels, bedding, or other items with other people in your home. After using these items, you should wash them thoroughly with soap and water.  Cover your coughs and sneezes Cover your mouth and nose with a tissue  when you cough or sneeze, or you can cough or sneeze into your sleeve. Throw used tissues in a lined trash can, and immediately wash your hands with soap and water for at least 20 seconds or use an alcohol-based hand rub.  Wash your [facility] your hands often and thoroughly with soap and water for at least 20 seconds. You can use an alcohol-based hand sanitizer if soap and water are not available and if your hands are not visibly dirty. Avoid touching your eyes, nose, and mouth with unwashed hands.   Prevention Steps for Caregivers and Household Members of Individuals Confirmed to have, or Being Evaluated for, COVID-19 Infection Being Cared for in the Home  If you live with, or provide care at home for, a person confirmed to have, or being evaluated for, COVID-19 infection please follow these guidelines to prevent infection:  Follow healthcare provider's instructions Make sure that you understand and can help the patient follow any healthcare provider instructions for all care.  Provide for the patient's basic needs You should help the patient with basic needs in the home and provide support for getting groceries, prescriptions, and other personal needs.  Monitor the patient's symptoms If they are getting sicker, call his or her medical provider and tell them that the patient has, or is being evaluated for, COVID-19 infection. This will help the healthcare provider's office take steps to keep other people from getting infected. Ask the healthcare provider to call the local or state health department.  Limit the number of people who have contact with the patient  If possible, have only one caregiver for the patient.  Other household members should stay in another home or place of residence. If this is not possible, they should stay  in another room, or be separated from the patient as much as possible. Use a separate bathroom, if available.  Restrict visitors who do not have an  essential need to be in the home.  Keep older adults, very young children, and other sick people away from the patient Keep older adults, very young children, and those who have compromised immune systems or chronic health conditions away from the patient. This includes people with chronic heart, lung, or kidney conditions, diabetes, and cancer.  Ensure good ventilation Make sure that shared spaces in the home have good air flow, such as from an air conditioner or an opened window, weather permitting.  Wash your hands often  Wash your hands often and thoroughly with soap and water for at least 20 seconds. You can use an alcohol based hand sanitizer if soap and water are not available and if your hands are not visibly dirty.  Avoid touching your eyes, nose, and mouth with unwashed hands.  Use disposable paper towels to dry your hands. If not available, use dedicated cloth towels and replace them when they become wet.  Wear a facemask and gloves  Wear a disposable facemask at all times in the room and gloves when you touch or have contact with the patient's blood, body fluids, and/or secretions or excretions, such as sweat, saliva, sputum, nasal mucus, vomit, urine, or feces.  Ensure the mask fits over your nose and mouth tightly, and do  not touch it during use.  Throw out disposable facemasks and gloves after using them. Do not reuse.  Wash your hands immediately after removing your facemask and gloves.  If your personal clothing becomes contaminated, carefully remove clothing and launder. Wash your hands after handling contaminated clothing.  Place all used disposable facemasks, gloves, and other waste in a lined container before disposing them with other household waste.  Remove gloves and wash your hands immediately after handling these items.  Do not share dishes, glasses, or other household items with the patient  Avoid sharing household items. You should not share dishes,  drinking glasses, cups, eating utensils, towels, bedding, or other items with a patient who is confirmed to have, or being evaluated for, COVID-19 infection.  After the person uses these items, you should wash them thoroughly with soap and water.  Wash laundry thoroughly  Immediately remove and wash clothes or bedding that have blood, body fluids, and/or secretions or excretions, such as sweat, saliva, sputum, nasal mucus, vomit, urine, or feces, on them.  Wear gloves when handling laundry from the patient.  Read and follow directions on labels of laundry or clothing items and detergent. In general, wash and dry with the warmest temperatures recommended on the label.  Clean all areas the individual has used often  Clean all touchable surfaces, such as counters, tabletops, doorknobs, bathroom fixtures, toilets, phones, keyboards, tablets, and bedside tables, every day. Also, clean any surfaces that may have blood, body fluids, and/or secretions or excretions on them.  Wear gloves when cleaning surfaces the patient has come in contact with.  Use a diluted bleach solution (e.g., dilute bleach with 1 part bleach and 10 parts water) or a household disinfectant with a label that says EPA-registered for coronaviruses. To make a bleach solution at home, add 1 tablespoon of bleach to 1 quart (4 cups) of water. For a larger supply, add  cup of bleach to 1 gallon (16 cups) of water.  Read labels of cleaning products and follow recommendations provided on product labels. Labels contain instructions for safe and effective use of the cleaning product including precautions you should take when applying the product, such as wearing gloves or eye protection and making sure you have good ventilation during use of the product.  Remove gloves and wash hands immediately after cleaning.  Monitor yourself for signs and symptoms of illness Caregivers and household members are considered close contacts, should  monitor their health, and will be asked to limit movement outside of the home to the extent possible. Follow the monitoring steps for close contacts listed on the symptom monitoring form.   ? If you have additional questions, contact your local health department or call the epidemiologist on call at [phone] (available 24/7). ? This guidance is subject to change. For the most up-to-date guidance from CDC, please refer to their website: YouBlogs.pl    Activity:  As tolerated with Full fall precautions use walker/cane & assistance as needed  Discharge Instructions    Call MD for:  difficulty breathing, headache or visual disturbances   Complete by: As directed    Call MD for:  extreme fatigue   Complete by: As directed    Diet - low sodium heart healthy   Complete by: As directed    Discharge instructions   Complete by: As directed    Follow with Primary MD  [name], MD in 1-2 weeks  Please ensure follow-up with pulmonology, urology and general surgery.  Gastroenterology's office  will call you with a follow-up appointment  Continue to hold aspirin-until seen by gastroenterology.  We have switched you blood pressure medication from olmesartan to amlodipine-olmesartan caused your potassium levels to be high.  Follow-up with your primary care practitioner.  Please get a complete blood count and chemistry panel checked by your Primary MD at your next visit, and again as instructed by your Primary MD.  Get Medicines reviewed and adjusted: Please take all your medications with you for your next visit with your Primary MD  Laboratory/radiological data: Please request your Primary MD to go over all hospital tests and procedure/radiological results at the follow up, please ask your Primary MD to get all Hospital records sent to his/her office.  In some cases, they will be blood work, cultures and biopsy results  pending at the time of your discharge. Please request that your primary care M.D. follows up on these results.  Also Note the following: If you experience worsening of your admission symptoms, develop shortness of breath, life threatening emergency, suicidal or homicidal thoughts you must seek medical attention immediately by calling 911 or calling your MD immediately  if symptoms less severe.  You must read complete instructions/literature along with all the possible adverse reactions/side effects for all the Medicines you take and that have been prescribed to you. Take any new Medicines after you have completely understood and accpet all the possible adverse reactions/side effects.   Do not drive when taking Pain medications or sleeping medications (Benzodaizepines)  Do not take more than prescribed Pain, Sleep and Anxiety Medications. It is not advisable to combine anxiety,sleep and pain medications without talking with your primary care practitioner  Special Instructions: If you have smoked or chewed Tobacco  in the last 2 yrs please stop smoking, stop any regular Alcohol  and or any Recreational drug use.  Wear Seat belts while driving.  Please note: You were cared for by a hospitalist during your hospital stay. Once you are discharged, your primary care physician will handle any further medical issues. Please note that NO REFILLS for any discharge medications will be authorized once you are discharged, as it is imperative that you return to your primary care physician (or establish a relationship with a primary care physician if you do not have one) for your post hospital discharge needs so that they can reassess your need for medications and monitor your lab values.   Increase activity slowly   Complete by: As directed    No wound care   Complete by: As directed      Allergies as of 12/09/2019      Reactions   Flexeril [cyclobenzaprine] Other (See Comments)   confusion   Statins  Other (See Comments)   MUSCLE CRAMPS   Valium [diazepam] Other (See Comments)   Talking out of his head   Adhesive [tape] Itching, Rash   Blisters, Please use "paper" tape   Penicillins Rash   Has patient had a PCN reaction causing immediate rash, facial/tongue/throat swelling, SOB or lightheadedness with hypotension: Unknown Has patient had a PCN reaction causing severe rash involving mucus membranes or skin necrosis: No Has patient had a PCN reaction that required hospitalization No Has patient had a PCN reaction occurring within the last 10 years: No If all of the above answers are "NO", then may proceed with Cephalosporin use. Patient tolerating piperacillin/tazobactam      Medication List    STOP taking these medications   amoxicillin-clavulanate 875-125 MG tablet Commonly known  as: AUGMENTIN   aspirin 325 MG EC tablet   azithromycin 500 MG tablet Commonly known as: ZITHROMAX   dexamethasone 6 MG tablet Commonly known as: DECADRON   olmesartan 40 MG tablet Commonly known as: BENICAR     TAKE these medications   allopurinol 300 MG tablet Commonly known as: ZYLOPRIM Take 300 mg by mouth daily.   amLODipine 5 MG tablet Commonly known as: NORVASC Take 1 tablet (5 mg total) by mouth daily.   bicalutamide 50 MG tablet Commonly known as: CASODEX Take 50 mg by mouth at bedtime.   colchicine 0.6 MG tablet Take 1 tablet (0.6 mg total) by mouth daily. What changed:   when to take this  reasons to take this   feeding supplement (GLUCERNA SHAKE) Liqd Take 237 mLs by mouth 3 (three) times daily between meals.   fenofibrate 160 MG tablet Take 160 mg by mouth every evening.   finasteride 5 MG tablet Commonly known as: PROSCAR Take 5 mg by mouth at bedtime.   fluconazole 200 MG tablet Commonly known as: DIFLUCAN Take 1 tablet (200 mg total) by mouth daily for 13 days. Start taking on: December 10, 2019   metFORMIN 1000 MG tablet Commonly known as:  GLUCOPHAGE Take 0.5 tablets (500 mg total) by mouth 2 (two) times daily with a meal.   oxyCODONE-acetaminophen 5-325 MG tablet Commonly known as: PERCOCET/ROXICET Take 1-2 tablets by mouth every 6 (six) hours as needed (pain).   pantoprazole 40 MG tablet Commonly known as: PROTONIX Take 1 tablet (40 mg total) by mouth 2 (two) times daily before a meal. Twice daily dosing for 2 months, followed by daily dosing. What changed:   when to take this  additional instructions   predniSONE 10 MG tablet Commonly known as: DELTASONE Take 40 mg daily for 1 day, 30 mg daily for 1 day, 20 mg daily for 1 days,10 mg daily for 1 day, then stop   Repatha SureClick 914 MG/ML Soaj Generic drug: Evolocumab Inject 1 Dose into the skin every 14 (fourteen) days. What changed: how much to take   saccharomyces boulardii 250 MG capsule Commonly known as: Florastor Take 1 capsule (250 mg total) by mouth 2 (two) times daily.   triamcinolone cream 0.1 % Commonly known as: KENALOG Apply 1 application topically 2 (two) times daily as needed (flare).       Follow-up Information    [name], MD. Schedule an appointment as soon as possible for a visit in 1 week(s).   Specialty: Family Medicine Contact information: [address] [place] [address] [phone]        Tanda Rockers, MD Follow up on 01/22/2020.   Specialty: Pulmonary Disease Why: keep appt Contact information: [place] [address] [phone]        Lauraine Rinne, NP Follow up on 12/23/2019.   Specialty: Pulmonary Disease Why: appt at 10:30 am Contact information: [address] [place] [address] [phone]              Allergies  Allergen Reactions  . Flexeril [Cyclobenzaprine] Other (See Comments)    confusion  . Statins Other (See Comments)    MUSCLE CRAMPS  . Valium [Diazepam] Other (See Comments)    Talking out of his head  . Adhesive  [Tape] Itching and Rash    Blisters, Please use "paper" tape  . Penicillins Rash    Has patient had a PCN  reaction causing immediate rash, facial/tongue/throat swelling, SOB or lightheadedness with hypotension: Unknown Has patient had a PCN reaction causing severe rash involving mucus membranes or skin necrosis: No Has patient had a PCN reaction that required hospitalization No Has patient had a PCN reaction occurring within the last 10 years: No If all of the above answers are "NO", then may proceed with Cephalosporin use. Patient tolerating piperacillin/tazobactam       Other Procedures/Studies: DG Lumbar Spine 2-3 Views  Result Date: 12/04/2019 CLINICAL DATA:  Lumbosacral back pain. EXAM: LUMBAR SPINE - 2-3 VIEW COMPARISON:  Reformats from abdominal CT earlier today. FINDINGS: Normal alignment. Vertebral body heights are preserved. Significant disc space narrowing at L4-L5 and L5-S1. Facet hypertrophy at these levels. Multilevel endplate spurring of the upper lumbar spine with preservation of disc spaces. No fracture or focal lesion. Retained excreted IV contrast within dilated left renal collecting system as seen on CT earlier today. There is excreted IV contrast in the urinary bladder. IMPRESSION: 1. 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 No acute osseous abnormality. 2. Retained excreted IV contrast within dilated left renal collecting system as seen on CT earlier today. Electronically Signed   By: Keith Rake M.D.   On: 12/04/2019 23:48   CT Chest W Contrast  Result Date: 11/15/2019 CLINICAL DATA:  79 year old male with pneumonia. Concern for abscess or effusion. EXAM: CT CHEST, ABDOMEN, AND PELVIS WITH CONTRAST TECHNIQUE: Multidetector CT imaging of the chest, abdomen and pelvis was performed following the standard protocol during bolus administration of intravenous contrast. CONTRAST:  152mL OMNIPAQUE IOHEXOL 300 MG/ML  SOLN COMPARISON:  Chest CT  dated 02/14/2018. Chest radiograph dated 11/15/2019. FINDINGS: CT CHEST FINDINGS Cardiovascular: There is no cardiomegaly or pericardial effusion. Advanced 3 vessel coronary vascular calcification. There is advanced calcified and noncalcified plaque of the thoracic aorta. No aneurysmal dilatation or dissection. The central pulmonary arteries appear patent. Mediastinum/Nodes: Right hilar adenopathy measuring 13 mm. Subcarinal lymph node measures 10 mm in short axis. The esophagus and the thyroid gland are grossly unremarkable. No mediastinal fluid collection. Lungs/Pleura: There is background of emphysema. Bibasilar subpleural reticulation as seen on the prior CT in keeping with interstitial lung disease. Overall slight progression of the fibrotic changes since the prior CT. There is a 2.6 x 2.7 cm cavitary nodule in the superior segment of the right lower lobe which may represent an abscess, an infected pneumatocele. Fungal infection, TB or cavitary neoplasm are not excluded. Clinical correlation and follow-up to resolution is recommended. Several scattered bilateral lower lobe nodular densities, present on the prior CT. No lobar consolidation, pleural effusion, or pneumothorax. The central airways are patent. Musculoskeletal: No chest wall mass or suspicious bone lesions identified. CT ABDOMEN PELVIS FINDINGS No intra-abdominal free air or free fluid. Hepatobiliary: There is fatty infiltration of the liver. Slight irregularity of the liver contour may represent early changes of cirrhosis. Elastography may provide better evaluation. There is tumefactive sludge versus noncalcified stones within the gallbladder. There is diffuse gallbladder wall thickening and small pericholecystic fluid. Findings may be related to underlying liver disease although acute cholecystitis is not excluded. Further evaluation with right upper quadrant ultrasound recommended. Pancreas: Unremarkable. No pancreatic ductal dilatation or  surrounding inflammatory changes. Spleen: Normal in size without focal abnormality. Adrenals/Urinary Tract: The adrenal glands are unremarkable. Multiple bilateral renal cysts with the largest a lobulated appearing left renal interpolar cyst measuring up to 13 cm in length. Several additional subcentimeter hypodensities are too small to characterize.  There is no hydronephrosis on either side. There is symmetric enhancement and excretion of contrast by both kidneys. Mild bilateral perinephric stranding, nonspecific. Correlation with urinalysis recommended to exclude UTI. The visualized ureters and urinary bladder appear unremarkable. Stomach/Bowel: There is severe sigmoid diverticulosis without active inflammatory changes. There is no bowel obstruction or active inflammation. The appendix is normal. Vascular/Lymphatic: There is advanced aortoiliac atherosclerotic disease. 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 The graft appears patent. There is advanced atherosclerotic calcification of the common femoral arteries bilaterally, right greater left. The IVC is unremarkable. No portal venous gas. There is no adenopathy. Reproductive: Prostatectomy. 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 Musculoskeletal: There is degenerative changes of the spine. No acute osseous pathology. IMPRESSION: 1. A 2.6 x 2.7 cm cavitary nodule in the superior segment of the right lower lobe may represent an abscess, an infected pneumatocele. Fungal infection, TB or cavitary neoplasm are not excluded. Clinical correlation and follow-up to resolution is recommended. 2. Emphysema with slight progression of the interstitial lung disease since the prior CT. 3. Fatty liver with possible early changes of cirrhosis. Elastography may provide better evaluation. 4. Gallbladder tumefactive sludge versus noncalcified stones. Diffuse gallbladder wall thickening and small  pericholecystic fluid may be related to underlying liver disease although acute cholecystitis is not excluded. Further evaluation with right upper quadrant ultrasound recommended. 5. Severe sigmoid diverticulosis. No bowel obstruction. Normal appendix. 6. Aortic Atherosclerosis (ICD10-I70.0) and Emphysema (ICD10-J43.9). Electronically Signed   By: Anner Crete M.D.   On: 11/15/2019 19:11   CT Angio Chest PE W and/or Wo Contrast  Result Date: 12/04/2019 CLINICAL DATA:  COVID pneumonia, dyspnea, productive cough, unspecified abdominal pain EXAM: CT ANGIOGRAPHY CHEST CT ABDOMEN AND PELVIS WITH CONTRAST TECHNIQUE: Multidetector CT imaging of the chest was performed using the standard protocol during bolus administration of intravenous contrast. Multiplanar CT image reconstructions and MIPs were obtained to evaluate the vascular anatomy. Multidetector CT imaging of the abdomen and pelvis was performed using the standard protocol during bolus administration of intravenous contrast. CONTRAST:  26mL OMNIPAQUE IOHEXOL 350 MG/ML SOLN COMPARISON:  11/15/2019 FINDINGS: CTA CHEST FINDINGS Cardiovascular: There is excellent opacification of the pulmonary arterial tree. There is no intraluminal filling defect to suggest acute pulmonary embolism. There is borderline enlargement of the central pulmonary arteries suggesting mild pulmonary arterial hypertension. There is extensive multi-vessel coronary artery calcification noted. Global cardiac size is within normal limits. Left ventricular hypertrophy is noted. There is mild calcification of the aortic valve leaflets noted. The thoracic aorta demonstrates mild calcification within the aortic arch. Thoracic aorta is of normal caliber. Mediastinum/Nodes: Multiple pathologically enlarged right hilar lymph nodes are identifiedl measuring up to 19 mm in short axis diameter. Lungs/Pleura: Since the prior examination, multifocal ground-glass pulmonary infiltrates and peripheral  areas of consolidation have significantly progressed in keeping with progressive atypical infection and compatible with progressive changes of COVID-19 pneumonia. There Isq, however, a focal cavitary nodule identified within the superior segment of the right lower lobe measuring 2.5 x 2.5 cm, similar to that noted on prior examination, with associated pathologic right hilar adenopathy. Differential considerations are unchanged, though malignancy is strongly considered given the associated pathologic right hilar adenopathy. No pneumothorax or pleural effusion. No central obstructing mass. Bronchial wall thickening is again identified in keeping with airway inflammation. Musculoskeletal: No chest wall abnormality. No acute or significant osseous findings. Review of the MIP images confirms the above findings.  CT ABDOMEN and PELVIS FINDINGS Hepatobiliary: Layering high density material within the gallbladder lumen is in keeping with inspissated sludge or tiny gallstones. There is pneumobilia now identified within the biliary tree suggesting changes of interval sphincterotomy. No intra or extrahepatic biliary ductal dilation. The liver is unremarkable. Pancreas: Unremarkable Spleen: Unremarkable Adrenals/Urinary Tract: The adrenal glands are unremarkable. The kidneys are normal in size and position. There is mild left hydronephrosis to the level of the ureteropelvic junction with decompressive the ureter thereafter in keeping with a moderate left UPJ obstruction. Superimposed parapelvic and cortical simple cysts are identified. There is symmetric enhancement of the left kidney however. Multiple simple cortical cysts are seen within the right kidney. No hydronephrosis on the right. No renal or ureteral calculi. The bladder is unremarkable. Stomach/Bowel: Moderate sigmoid diverticulosis is identified without superimposed inflammatory change. Scattered diverticula are seen within the ascending colon. Stomach, small bowel,  and large bowel are otherwise unremarkable. Appendix normal. No free intraperitoneal gas or fluid Vascular/Lymphatic: Moderate soft mural plaque is identified within the distal descending thoracic aorta at the diaphragmatic hiatus. 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 Extensive atherosclerotic calcification is seen within the terminal lower extremity arterial inflow and visualized outflow. There is no pathologic adenopathy within the abdomen and pelvis. Reproductive: Status post prostatectomy. Other: Rectum unremarkable. Musculoskeletal: No lytic or blastic bone lesions within the abdomen. Review of the MIP images confirms the above findings. IMPRESSION: 1. No evidence of acute pulmonary embolism. 2. Significant interval progression of multifocal ground-glass pulmonary infiltrates and peripheral areas of consolidation in keeping with progressive atypical infection and compatible with COVID-19 pneumonia. 3. Persistent cavitary nodule within the superior segment of the right lower lobe, with associated pathologic right hilar adenopathy. Differential considerations are unchanged, though malignancy is strongly considered given the associated pathologic right hilar adenopathy. Short-term follow-up imaging or PET-CT examination may be helpful once the patient's acute issues have resolved for further evaluation. 4. Interval probable sphincterotomy with pneumobilia. Cholelithiasis persists though superimposed pericholecystic inflammatory change has resolved. 5. Moderate left UPJ obstruction. 6. Diverticulosis without evidence of acute diverticulitis. Electronically Signed   By: Fidela Salisbury MD   On: 12/04/2019 20:02   NM Hepatobiliary Liver Func  Result Date: 11/17/2019 CLINICAL DATA:  Nausea and vomiting. Cholelithiasis and choledocholithiasis. EXAM: NUCLEAR MEDICINE HEPATOBILIARY IMAGING TECHNIQUE: Sequential images of the abdomen were obtained out to 60 minutes following intravenous administration of  radiopharmaceutical. RADIOPHARMACEUTICALS:  5.5 mCi Tc-23m  Choletec IV COMPARISON:  None. FINDINGS: Prompt uptake and biliary excretion of activity by the liver is seen. Gallbladder activity is visualized, consistent with patency of cystic duct. Biliary activity passes into small bowel, consistent with patent common bile duct. Reflux of biliary activity into the stomach is noted. IMPRESSION: Patency of both cystic and common bile ducts is demonstrated. Bile reflux noted. Electronically Signed   By: Marlaine Hind M.D.   On: 11/17/2019 15:15   CT ABDOMEN PELVIS W CONTRAST  Result Date: 12/04/2019 CLINICAL DATA:  COVID pneumonia, dyspnea, productive cough, unspecified abdominal pain EXAM: CT ANGIOGRAPHY CHEST CT ABDOMEN AND PELVIS WITH CONTRAST TECHNIQUE: Multidetector CT imaging of the chest was performed using the standard protocol during bolus administration of intravenous contrast. Multiplanar CT image reconstructions and MIPs were obtained to evaluate the vascular anatomy. Multidetector CT imaging of the abdomen and pelvis was performed using the standard protocol during bolus administration of intravenous contrast. CONTRAST:  79mL OMNIPAQUE IOHEXOL 350 MG/ML SOLN COMPARISON:  11/15/2019 FINDINGS: CTA CHEST FINDINGS Cardiovascular: There is excellent  opacification of the pulmonary arterial tree. There is no intraluminal filling defect to suggest acute pulmonary embolism. There is borderline enlargement of the central pulmonary arteries suggesting mild pulmonary arterial hypertension. There is extensive multi-vessel coronary artery calcification noted. Global cardiac size is within normal limits. Left ventricular hypertrophy is noted. There is mild calcification of the aortic valve leaflets noted. The thoracic aorta demonstrates mild calcification within the aortic arch. Thoracic aorta is of normal caliber. Mediastinum/Nodes: Multiple pathologically enlarged right hilar lymph nodes are identifiedl measuring up  to 19 mm in short axis diameter. Lungs/Pleura: Since the prior examination, multifocal ground-glass pulmonary infiltrates and peripheral areas of consolidation have significantly progressed in keeping with progressive atypical infection and compatible with progressive changes of COVID-19 pneumonia. There Isq, however, a focal cavitary nodule identified within the superior segment of the right lower lobe measuring 2.5 x 2.5 cm, similar to that noted on prior examination, with associated pathologic right hilar adenopathy. Differential considerations are unchanged, though malignancy is strongly considered given the associated pathologic right hilar adenopathy. No pneumothorax or pleural effusion. No central obstructing mass. Bronchial wall thickening is again identified in keeping with airway inflammation. Musculoskeletal: No chest wall abnormality. No acute or significant osseous findings. Review of the MIP images confirms the above findings. CT ABDOMEN and PELVIS FINDINGS Hepatobiliary: Layering high density material within the gallbladder lumen is in keeping with inspissated sludge or tiny gallstones. There is pneumobilia now identified within the biliary tree suggesting changes of interval sphincterotomy. No intra or extrahepatic biliary ductal dilation. The liver is unremarkable. Pancreas: Unremarkable Spleen: Unremarkable Adrenals/Urinary Tract: The adrenal glands are unremarkable. The kidneys are normal in size and position. There is mild left hydronephrosis to the level of the ureteropelvic junction with decompressive the ureter thereafter in keeping with a moderate left UPJ obstruction. Superimposed parapelvic and cortical simple cysts are identified. There is symmetric enhancement of the left kidney however. Multiple simple cortical cysts are seen within the right kidney. No hydronephrosis on the right. No renal or ureteral calculi. The bladder is unremarkable. Stomach/Bowel: Moderate sigmoid diverticulosis  is identified without superimposed inflammatory change. Scattered diverticula are seen within the ascending colon. Stomach, small bowel, and large bowel are otherwise unremarkable. Appendix normal. No free intraperitoneal gas or fluid Vascular/Lymphatic: Moderate soft mural plaque is identified within the distal descending thoracic aorta at the diaphragmatic hiatus. 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 Extensive atherosclerotic calcification is seen within the terminal lower extremity arterial inflow and visualized outflow. There is no pathologic adenopathy within the abdomen and pelvis. Reproductive: Status post prostatectomy. Other: Rectum unremarkable. Musculoskeletal: No lytic or blastic bone lesions within the abdomen. Review of the MIP images confirms the above findings. IMPRESSION: 1. No evidence of acute pulmonary embolism. 2. Significant interval progression of multifocal ground-glass pulmonary infiltrates and peripheral areas of consolidation in keeping with progressive atypical infection and compatible with COVID-19 pneumonia. 3. Persistent cavitary nodule within the superior segment of the right lower lobe, with associated pathologic right hilar adenopathy. Differential considerations are unchanged, though malignancy is strongly considered given the associated pathologic right hilar adenopathy. Short-term follow-up imaging or PET-CT examination may be helpful once the patient's acute issues have resolved for further evaluation. 4. Interval probable sphincterotomy with pneumobilia. Cholelithiasis persists though superimposed pericholecystic inflammatory change has resolved. 5. Moderate left UPJ obstruction. 6. Diverticulosis without evidence of acute diverticulitis. Electronically Signed   By: Fidela Salisbury MD   On: 12/04/2019 20:02   CT Abdomen Pelvis W Contrast  Result  Date: 11/15/2019 CLINICAL DATA:  79 year old male with pneumonia. Concern for abscess or effusion. EXAM: CT CHEST,  ABDOMEN, AND PELVIS WITH CONTRAST TECHNIQUE: Multidetector CT imaging of the chest, abdomen and pelvis was performed following the standard protocol during bolus administration of intravenous contrast. CONTRAST:  133mL OMNIPAQUE IOHEXOL 300 MG/ML  SOLN COMPARISON:  Chest CT dated 02/14/2018. Chest radiograph dated 11/15/2019. FINDINGS: CT CHEST FINDINGS Cardiovascular: There is no cardiomegaly or pericardial effusion. Advanced 3 vessel coronary vascular calcification. There is advanced calcified and noncalcified plaque of the thoracic aorta. No aneurysmal dilatation or dissection. The central pulmonary arteries appear patent. Mediastinum/Nodes: Right hilar adenopathy measuring 13 mm. Subcarinal lymph node measures 10 mm in short axis. The esophagus and the thyroid gland are grossly unremarkable. No mediastinal fluid collection. Lungs/Pleura: There is background of emphysema. Bibasilar subpleural reticulation as seen on the prior CT in keeping with interstitial lung disease. Overall slight progression of the fibrotic changes since the prior CT. There is a 2.6 x 2.7 cm cavitary nodule in the superior segment of the right lower lobe which may represent an abscess, an infected pneumatocele. Fungal infection, TB or cavitary neoplasm are not excluded. Clinical correlation and follow-up to resolution is recommended. Several scattered bilateral lower lobe nodular densities, present on the prior CT. No lobar consolidation, pleural effusion, or pneumothorax. The central airways are patent. Musculoskeletal: No chest wall mass or suspicious bone lesions identified. CT ABDOMEN PELVIS FINDINGS No intra-abdominal free air or free fluid. Hepatobiliary: There is fatty infiltration of the liver. Slight irregularity of the liver contour may represent early changes of cirrhosis. Elastography may provide better evaluation. There is tumefactive sludge versus noncalcified stones within the gallbladder. There is diffuse gallbladder wall  thickening and small pericholecystic fluid. Findings may be related to underlying liver disease although acute cholecystitis is not excluded. Further evaluation with right upper quadrant ultrasound recommended. Pancreas: Unremarkable. No pancreatic ductal dilatation or surrounding inflammatory changes. Spleen: Normal in size without focal abnormality. Adrenals/Urinary Tract: The adrenal glands are unremarkable. Multiple bilateral renal cysts with the largest a lobulated appearing left renal interpolar cyst measuring up to 13 cm in length. Several additional subcentimeter hypodensities are too small to characterize. There is no hydronephrosis on either side. There is symmetric enhancement and excretion of contrast by both kidneys. Mild bilateral perinephric stranding, nonspecific. Correlation with urinalysis recommended to exclude UTI. The visualized ureters and urinary bladder appear unremarkable. Stomach/Bowel: There is severe sigmoid diverticulosis without active inflammatory changes. There is no bowel obstruction or active inflammation. The appendix is normal. Vascular/Lymphatic: There is advanced aortoiliac atherosclerotic disease. 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 The graft appears patent. There is advanced atherosclerotic calcification of the common femoral arteries bilaterally, right greater left. The IVC is unremarkable. No portal venous gas. There is no adenopathy. Reproductive: Prostatectomy. 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 Musculoskeletal: There is degenerative changes of the spine. No acute osseous pathology. IMPRESSION: 1. A 2.6 x 2.7 cm cavitary nodule in the superior segment of the right lower lobe may represent an abscess, an infected pneumatocele. Fungal infection, TB or cavitary neoplasm are not excluded. Clinical correlation and follow-up to resolution is recommended. 2. Emphysema with slight progression  of the interstitial lung disease since the prior CT. 3. Fatty liver with possible early changes of cirrhosis. Elastography may provide better evaluation. 4. Gallbladder tumefactive sludge versus noncalcified stones. Diffuse gallbladder wall thickening and small pericholecystic fluid may  be related to underlying liver disease although acute cholecystitis is not excluded. Further evaluation with right upper quadrant ultrasound recommended. 5. Severe sigmoid diverticulosis. No bowel obstruction. Normal appendix. 6. Aortic Atherosclerosis (ICD10-I70.0) and Emphysema (ICD10-J43.9). Electronically Signed   By: Anner Crete M.D.   On: 11/15/2019 19:11   US RENAL  Result Date: 11/18/2019 CLINICAL DATA:  Acute renal injury, COVID-19 positivity EXAM: RENAL / URINARY TRACT ULTRASOUND COMPLETE COMPARISON:  11/15/2019 FINDINGS: Right Kidney: Renal measurements: 12.3 x 5.1 x 4.3 cm. = volume: 140 mL. Less than 10 cysts are noted. The largest of these measures 2.7 cm in the midportion of the right kidney. Left Kidney: Renal measurements: 14.3 x 5.7 x 5.5 cm. = volume: 233 mL. Less than 10 cysts are noted. The largest of these measures 7.3 cm in greatest dimension. These are stable from the prior CT examination Bladder: Appears normal for degree of bladder distention. Other: Gallbladder is partially distended with echogenic material within similar to that seen on prior exam consistent with gallbladder sludge and stones. IMPRESSION: Gallbladder sludge and stones. Bilateral renal cysts which are simple in nature. No obstructive changes are noted. Electronically Signed   By: Inez Catalina M.D.   On: 11/18/2019 03:03   MR ABDOMEN MRCP WO CONTRAST  Result Date: 11/17/2019 CLINICAL DATA:  Cholelithiasis EXAM: MRI ABDOMEN WITHOUT CONTRAST  (INCLUDING MRCP) TECHNIQUE: Multiplanar multisequence MR imaging of the abdomen was performed. Heavily T2-weighted images of the biliary and pancreatic ducts were obtained, and  three-dimensional MRCP images were rendered by post processing. COMPARISON:  CT chest, abdomen and pelvis of November 15, 2019 FINDINGS: Lower chest: Small effusions at the lung bases. As basilar airspace disease and parenchymal findings demonstrated on the recent chest CT are not well evaluated on the MRI. Hepatobiliary: Mild hepatic steatosis. No focal, suspicious hepatic lesion, assessment limited by the noncontrast imaging. Pericholecystic stranding and signs of wall thickening. Mild biliary duct distension with numerous filling defects in the common bile duct compatible with multiple small stones. Largest approximately 5 mm. At least 4-5 additional small biliary calculi in the common bile duct. Pancreas:  No ductal dilation.  No peripancreatic inflammation. Spleen: Skin normal in size and contour without focal, suspicious lesion. Adrenals/Urinary Tract:  Adrenal glands are normal spleen. Bilateral renal cysts. Asymmetric albeit slightly Peri nephric stranding greater on the LEFT than the RIGHT. Dilated collecting systems in the upper pole and in the lower pole LEFT kidney perhaps due to mass-effect upon collecting systems in the setting of large renal sinus cysts with similar appearance to prior imaging studies. Stomach/Bowel: Stomach gastrointestinal tract with limited assessment, unremarkable to the extent evaluated. 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 Other:  None. Musculoskeletal: No suspicious bone lesions identified. IMPRESSION: 1. Choledocholithiasis with mild biliary duct dilation as described. 2. Pericholecystic stranding and signs of wall thickening, remaining suspicious for acute cholecystitis. If there are discordant clinical findings, HIDA scan may be helpful for further assessment. 3. Renal sinus cysts and stable collecting system dilation on the LEFT with renal cysts on the RIGHT and mild perinephric stranding,  nonspecific and similar to previous imaging studies. 4. Small effusions at the lung bases. 5. Mild hepatic steatosis. Electronically Signed   By: Zetta Bills M.D.   On: 11/17/2019 08:06   DG Chest Portable 1 View  Result Date: 12/04/2019 CLINICAL DATA:  Shortness of breath EXAM: PORTABLE CHEST 1 VIEW COMPARISON:  11/16/2019 FINDINGS: Patchy peripheral airspace opacities  throughout the right lung and in the left lower lobe concerning for pneumonia. Heart is normal size. No effusions or pneumothorax. No acute bony abnormality. IMPRESSION: Patchy peripheral airspace opacities, right greater than left concerning for pneumonia, possibly COVID pneumonia. Electronically Signed   By: Rolm Baptise M.D.   On: 12/04/2019 18:30   DG Chest Port 1 View  Result Date: 11/16/2019 CLINICAL DATA:  Shortness of breath and fever EXAM: PORTABLE CHEST 1 VIEW COMPARISON:  Chest radiograph and chest CT November 15, 2019 FINDINGS: There is a degree of underlying fibrosis. The cavitary lesion in the right lower lobe seen 1 day prior is less well seen by radiography although is evident, measuring 2.5 x 2.4 cm. There is a degree of lower lobe bronchiectatic change. No new opacity evident. Heart size and pulmonary vascularity are normal. Lymph node prominence seen on CT is not appreciable by radiography. There is aortic atherosclerosis. No bone lesions appreciable. IMPRESSION: Cavitary lesion right lower lobe, much better seen on recent CT. This nodular lesion measures 2.5 x 2.4 cm on portable radiographic examination. There is an underlying degree of fibrosis in the bases. No new opacity evident. Cardiac silhouette stable. Aortic Atherosclerosis (ICD10-I70.0). Electronically Signed   By: Lowella Grip III M.D.   On: 11/16/2019 09:34   DG Chest Port 1 View  Result Date: 11/15/2019 CLINICAL DATA:  Evaluate for infection. EXAM: PORTABLE CHEST 1 VIEW COMPARISON:  07/27/2015 FINDINGS: Lungs are adequately inflated without effusion or  pneumothorax. Mild increased density over the right hilum/perihilar region. Cardiomediastinal silhouette and remainder the exam is unchanged. IMPRESSION: Minimal increased density over the right hilar/perihilar region. Medial airspace process is possible. Consider PA and lateral chest radiograph for better evaluation. Electronically Signed   By: Marin Olp M.D.   On: 11/15/2019 17:04   DG ERCP BILIARY & PANCREATIC DUCTS  Result Date: 11/19/2019 CLINICAL DATA:  ERCP for bile duct stones. EXAM: ERCP TECHNIQUE: Multiple spot images obtained with the fluoroscopic device and submitted for interpretation post-procedure. COMPARISON:  MRCP-11/17/2019; nuclear medicine HIDA scan-11/17/2019 FINDINGS: Eight spot intraoperative fluoroscopic images of the right upper abdominal quadrant during ERCP are provided for review Initial image demonstrates an ERCP probe overlying the right upper abdominal quadrant Subsequent images demonstrate selective cannulation opacification of the common bile duct. There are several filling defects within the CBD, potentially representative of air bubbles versus choledocholithiasis. Subsequent images demonstrate insufflation of a balloon within the central aspect of the CBD with subsequent biliary sweeping and presumed sphincterotomy. There is minimal opacification the cystic duct with passage of contrast to the level of the gallbladder lumen. There is minimal opacification intrahepatic biliary tree which appears nondilated. IMPRESSION: ERCP with biliary sweeping and presumed sphincterotomy as above. These images were submitted for radiologic interpretation only. Please see the procedural report for the amount of contrast and the fluoroscopy time utilized. Electronically Signed   By: Sandi Mariscal M.D.   On: 11/19/2019 07:40   VAS [place] LOWER EXTREMITY VENOUS (DVT)  Result Date: 12/07/2019  Lower Venous DVTStudy Indications: Elevated ddimer.  Comparison Study: 11/20/19 previous Performing  Technologist: Abram Sander RVS  Examination Guidelines: A complete evaluation includes B-mode imaging, spectral Doppler, color Doppler, and power Doppler as needed of all accessible portions of each vessel. Bilateral testing is considered an integral part of a complete examination. Limited examinations for reoccurring indications may be performed as noted. The reflux portion of the exam is performed with the patient in reverse Trendelenburg.  +---------+---------------+---------+-----------+----------+--------------+ RIGHT    CompressibilityPhasicitySpontaneityPropertiesThrombus  Aging +---------+---------------+---------+-----------+----------+--------------+ CFV      Full           Yes      Yes                                 +---------+---------------+---------+-----------+----------+--------------+ SFJ      Full                                                        +---------+---------------+---------+-----------+----------+--------------+ FV Prox  Full                                                        +---------+---------------+---------+-----------+----------+--------------+ FV Mid   Full                                                        +---------+---------------+---------+-----------+----------+--------------+ FV DistalFull                                                        +---------+---------------+---------+-----------+----------+--------------+ PFV      Full                                                        +---------+---------------+---------+-----------+----------+--------------+ POP      Full           Yes      Yes                                 +---------+---------------+---------+-----------+----------+--------------+ PTV      Full                                                        +---------+---------------+---------+-----------+----------+--------------+ PERO     Full                                                         +---------+---------------+---------+-----------+----------+--------------+   +---------+---------------+---------+-----------+----------+--------------+ LEFT     CompressibilityPhasicitySpontaneityPropertiesThrombus Aging +---------+---------------+---------+-----------+----------+--------------+ CFV      Full           Yes      Yes                                 +---------+---------------+---------+-----------+----------+--------------+  SFJ      Full                                                        +---------+---------------+---------+-----------+----------+--------------+ FV Prox  Full                                                        +---------+---------------+---------+-----------+----------+--------------+ FV Mid   Full                                                        +---------+---------------+---------+-----------+----------+--------------+ FV DistalFull                                                        +---------+---------------+---------+-----------+----------+--------------+ PFV      Full                                                        +---------+---------------+---------+-----------+----------+--------------+ POP      Full           Yes      Yes                                 +---------+---------------+---------+-----------+----------+--------------+ PTV      Full                                                        +---------+---------------+---------+-----------+----------+--------------+ PERO     Full                                                        +---------+---------------+---------+-----------+----------+--------------+     Summary: BILATERAL: - No evidence of deep vein thrombosis seen in the lower extremities, bilaterally. - No evidence of superficial venous thrombosis in the lower extremities, bilaterally. -   *See table(s) above for measurements and observations.  Electronically signed by Monica Martinez MD on 12/07/2019 at 3:44:25 PM.    Final    VAS [place] LOWER EXTREMITY VENOUS (DVT)  Result Date: 11/21/2019  Lower Venous DVTStudy Indications: Elevated d-dimer.  Comparison Study: 07-27-2015 LEV study Performing Technologist: Darlin Coco  Examination Guidelines: A complete evaluation includes B-mode imaging, spectral Doppler, color Doppler, and power Doppler as needed of all accessible portions of each vessel. Bilateral  testing is considered an integral part of a complete examination. Limited examinations for reoccurring indications may be performed as noted. The reflux portion of the exam is performed with the patient in reverse Trendelenburg.  +---------+---------------+---------+-----------+----------+--------------+ RIGHT    CompressibilityPhasicitySpontaneityPropertiesThrombus Aging +---------+---------------+---------+-----------+----------+--------------+ CFV      Full           Yes      Yes                                 +---------+---------------+---------+-----------+----------+--------------+ SFJ      Full           Yes      Yes                                 +---------+---------------+---------+-----------+----------+--------------+ FV Prox  Full                                                        +---------+---------------+---------+-----------+----------+--------------+ FV Mid   Full                                                        +---------+---------------+---------+-----------+----------+--------------+ FV DistalFull                                                        +---------+---------------+---------+-----------+----------+--------------+ PFV      Full                                                        +---------+---------------+---------+-----------+----------+--------------+ POP      Full           Yes      Yes                                  +---------+---------------+---------+-----------+----------+--------------+ PTV      Full                                                        +---------+---------------+---------+-----------+----------+--------------+ PERO     Full                                                        +---------+---------------+---------+-----------+----------+--------------+   +---------+---------------+---------+-----------+----------+--------------+ LEFT     CompressibilityPhasicitySpontaneityPropertiesThrombus Aging +---------+---------------+---------+-----------+----------+--------------+ CFV      Full  Yes      Yes                                 +---------+---------------+---------+-----------+----------+--------------+ SFJ      Full           Yes      Yes                                 +---------+---------------+---------+-----------+----------+--------------+ FV Prox  Full                                                        +---------+---------------+---------+-----------+----------+--------------+ FV Mid   Full                                                        +---------+---------------+---------+-----------+----------+--------------+ FV DistalFull                                                        +---------+---------------+---------+-----------+----------+--------------+ PFV      Full                                                        +---------+---------------+---------+-----------+----------+--------------+ POP      Full           Yes      Yes                                 +---------+---------------+---------+-----------+----------+--------------+ PTV      Full                                                        +---------+---------------+---------+-----------+----------+--------------+ PERO     Full                                                         +---------+---------------+---------+-----------+----------+--------------+     Summary: RIGHT: - There is no evidence of deep vein thrombosis in the lower extremity.  - No cystic structure found in the popliteal fossa.  LEFT: - There is no evidence of deep vein thrombosis in the lower extremity.  - No cystic structure found in the popliteal fossa.  *See table(s) above for measurements and observations. Electronically signed by [name] MD on 11/21/2019 at 4:19:57 AM.    Final  ECHOCARDIOGRAM LIMITED  Result Date: 11/16/2019    ECHOCARDIOGRAM LIMITED REPORT   Patient Name:   Cody Moss Date of Exam: 11/16/2019 Medical Rec #:  [id number]         Height:       70.0 in Accession #:    [id number]        Weight:       187.8 lb Date of Birth:  [birth year]         BSA:          2.032 m Patient Age:    7 years          BP:           103/63 mmHg Patient Gender: M                 HR:           58 bpm. Exam Location:  Inpatient Procedure: Limited Echo, Color Doppler and Cardiac Doppler Indications:    CAD Native Vessel i25.10  History:        Patient has prior history of Echocardiogram examinations, most                 recent 07/29/2015. CAD; Risk Factors:Hypertension, Diabetes and                 Dyslipidemia. COVID+ at time of study.  Sonographer:    Raquel Sarna Senior RDCS Referring Phys: [place]  1. Left ventricular ejection fraction, by estimation, is 55 to 60%. The left ventricle has normal function. The left ventricle has no regional wall motion abnormalities. Left ventricular diastolic function could not be evaluated.  2. Right ventricular systolic function is normal. The right ventricular size is normal. There is mildly elevated pulmonary artery systolic pressure. The estimated right ventricular systolic pressure is 94.7 mmHg.  3. The mitral valve is grossly normal. Mild mitral valve regurgitation. No evidence of mitral stenosis.  4. The aortic valve is tricuspid. Aortic valve  regurgitation is trivial. Mild aortic valve stenosis. Aortic valve mean gradient measures 9.9 mmHg. Aortic valve Vmax measures 2.15 m/s.  5. The inferior vena cava is dilated in size with <50% respiratory variability, suggesting right atrial pressure of 15 mmHg. Comparison(s): No significant change from prior study. FINDINGS  Left Ventricle: Left ventricular ejection fraction, by estimation, is 55 to 60%. The left ventricle has normal function. The left ventricle has no regional wall motion abnormalities. The left ventricular internal cavity size was normal in size. There is  no left ventricular hypertrophy. Right Ventricle: The right ventricular size is normal. No increase in right ventricular wall thickness. Right ventricular systolic function is normal. There is mildly elevated pulmonary artery systolic pressure. The tricuspid regurgitant velocity is 2.63  m/s, and with an assumed right atrial pressure of 15 mmHg, the estimated right ventricular systolic pressure is 65.4 mmHg. Left Atrium: Left atrial size was normal in size. Right Atrium: Right atrial size was normal in size. Pericardium: Trivial pericardial effusion is present. Presence of pericardial fat pad. Mitral Valve: The mitral valve is grossly normal. Mild mitral valve regurgitation. No evidence of mitral valve stenosis. Tricuspid Valve: The tricuspid valve is grossly normal. Tricuspid valve regurgitation is trivial. No evidence of tricuspid stenosis. Aortic Valve: The aortic valve is tricuspid. Aortic valve regurgitation is trivial. Mild aortic stenosis is present. Aortic valve mean gradient measures 9.9 mmHg. Aortic valve peak gradient measures 18.4 mmHg. Aortic valve area, by VTI measures 1.26 cm. Pulmonic  Valve: The pulmonic valve was grossly normal. Pulmonic valve regurgitation is trivial. No evidence of pulmonic stenosis. Aorta: The aortic root is normal in size and structure. Venous: The inferior vena cava is dilated in size with less than 50%  respiratory variability, suggesting right atrial pressure of 15 mmHg. IAS/Shunts: The atrial septum is grossly normal. LEFT VENTRICLE PLAX 2D LVOT diam:     2.10 cm LV SV:         64 LV SV Index:   31 LVOT Area:     3.46 cm  LV Volumes (MOD) LV vol d, MOD A4C: 145.0 ml LV vol s, MOD A4C: 67.7 ml LV SV MOD A4C:     145.0 ml RIGHT VENTRICLE RV S prime:     9.14 cm/s TAPSE (M-mode): 2.3 cm AORTIC VALVE AV Area (Vmax):    1.11 cm AV Area (Vmean):   1.14 cm AV Area (VTI):     1.26 cm AV Vmax:           214.63 cm/s AV Vmean:          148.039 cm/s AV VTI:            0.506 m AV Peak Grad:      18.4 mmHg AV Mean Grad:      9.9 mmHg LVOT Vmax:         68.50 cm/s LVOT Vmean:        48.900 cm/s LVOT VTI:          0.184 m LVOT/AV VTI ratio: 0.36 TRICUSPID VALVE TR Peak grad:   27.7 mmHg TR Vmax:        263.00 cm/s  SHUNTS Systemic VTI:  0.18 m Systemic Diam: 2.10 cm Eleonore Chiquito MD Electronically signed by Eleonore Chiquito MD Signature Date/Time: 11/16/2019/12:16:24 PM    Final    US Abdomen Limited RUQ  Result Date: 11/15/2019 CLINICAL DATA:  79 year old male with nausea vomiting. EXAM: ULTRASOUND ABDOMEN LIMITED RIGHT UPPER QUADRANT COMPARISON:  CT abdomen pelvis dated 11/15/2019. FINDINGS: Gallbladder: There is sludge and stone within the gallbladder. There is diffuse gallbladder wall thickening and edema and a small pericholecystic fluid. Evaluation for sonographic Murphy's sign was limited as the patient was pre-medicated. Common bile duct: Diameter: 7 mm Liver: There is diffuse increased liver echogenicity most commonly seen in the setting of fatty infiltration. Superimposed inflammation or fibrosis is not excluded. Clinical correlation is recommended. Portal vein is patent on color Doppler imaging with normal direction of blood flow towards the liver. Other: None. IMPRESSION: 1. Gallbladder sludge and stones with diffuse edema as seen on the earlier CT. A hepatobiliary scintigraphy may provide better evaluation of  the gallbladder if there is a high clinical concern for acute cholecystitis . 2. Fatty liver. Electronically Signed   By: Anner Crete M.D.   On: 11/15/2019 21:02     TODAY-DAY OF DISCHARGE:  Subjective:   Jairon Ripberger today has no headache,no chest abdominal pain,no new weakness tingling or numbness, feels much better wants to go home today.   Objective:   Blood pressure (!) 151/76, pulse 62, temperature (!) 97.1 F (36.2 C), resp. rate 18, height 5\' 10"  (1.778 m), weight 78 kg, SpO2 99 %.  Intake/Output Summary (Last 24 hours) at 12/09/2019 1139 Last data filed at 12/09/2019 0533 Gross per 24 hour  Intake --  Output 300 ml  Net -300 ml   Filed Weights   12/04/19 1748 12/07/19 1108  Weight: 78 kg 78 kg  Exam: Awake Alert, Oriented *3, No new F.N deficits, Normal affect [place].AT,PERRAL Supple Neck,No JVD, No cervical lymphadenopathy appriciated.  Symmetrical Chest wall movement, Good air movement bilaterally, CTAB RRR,No Gallops,Rubs or new Murmurs, No Parasternal Heave +ve B.Sounds, Abd Soft, Non tender, No organomegaly appriciated, No rebound -guarding or rigidity. No Cyanosis, Clubbing or edema, No new Rash or bruise   PERTINENT RADIOLOGIC STUDIES: DG Lumbar Spine 2-3 Views  Result Date: 12/04/2019 CLINICAL DATA:  Lumbosacral back pain. EXAM: LUMBAR SPINE - 2-3 VIEW COMPARISON:  Reformats from abdominal CT earlier today. FINDINGS: Normal alignment. Vertebral body heights are preserved. Significant disc space narrowing at L4-L5 and L5-S1. Facet hypertrophy at these levels. Multilevel endplate spurring of the upper lumbar spine with preservation of disc spaces. No fracture or focal lesion. Retained excreted IV contrast within dilated left renal collecting system as seen on CT earlier today. There is excreted IV contrast in the urinary bladder. IMPRESSION: 1. 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 No acute osseous abnormality. 2.  Retained excreted IV contrast within dilated left renal collecting system as seen on CT earlier today. Electronically Signed   By: Keith Rake M.D.   On: 12/04/2019 23:48   CT Chest W Contrast  Result Date: 11/15/2019 CLINICAL DATA:  79 year old male with pneumonia. Concern for abscess or effusion. EXAM: CT CHEST, ABDOMEN, AND PELVIS WITH CONTRAST TECHNIQUE: Multidetector CT imaging of the chest, abdomen and pelvis was performed following the standard protocol during bolus administration of intravenous contrast. CONTRAST:  126mL OMNIPAQUE IOHEXOL 300 MG/ML  SOLN COMPARISON:  Chest CT dated 02/14/2018. Chest radiograph dated 11/15/2019. FINDINGS: CT CHEST FINDINGS Cardiovascular: There is no cardiomegaly or pericardial effusion. Advanced 3 vessel coronary vascular calcification. There is advanced calcified and noncalcified plaque of the thoracic aorta. No aneurysmal dilatation or dissection. The central pulmonary arteries appear patent. Mediastinum/Nodes: Right hilar adenopathy measuring 13 mm. Subcarinal lymph node measures 10 mm in short axis. The esophagus and the thyroid gland are grossly unremarkable. No mediastinal fluid collection. Lungs/Pleura: There is background of emphysema. Bibasilar subpleural reticulation as seen on the prior CT in keeping with interstitial lung disease. Overall slight progression of the fibrotic changes since the prior CT. There is a 2.6 x 2.7 cm cavitary nodule in the superior segment of the right lower lobe which may represent an abscess, an infected pneumatocele. Fungal infection, TB or cavitary neoplasm are not excluded. Clinical correlation and follow-up to resolution is recommended. Several scattered bilateral lower lobe nodular densities, present on the prior CT. No lobar consolidation, pleural effusion, or pneumothorax. The central airways are patent. Musculoskeletal: No chest wall mass or suspicious bone lesions identified. CT ABDOMEN PELVIS FINDINGS No  intra-abdominal free air or free fluid. Hepatobiliary: There is fatty infiltration of the liver. Slight irregularity of the liver contour may represent early changes of cirrhosis. Elastography may provide better evaluation. There is tumefactive sludge versus noncalcified stones within the gallbladder. There is diffuse gallbladder wall thickening and small pericholecystic fluid. Findings may be related to underlying liver disease although acute cholecystitis is not excluded. Further evaluation with right upper quadrant ultrasound recommended. Pancreas: Unremarkable. No pancreatic ductal dilatation or surrounding inflammatory changes. Spleen: Normal in size without focal abnormality. Adrenals/Urinary Tract: The adrenal glands are unremarkable. Multiple bilateral renal cysts with the largest a lobulated appearing left renal interpolar cyst measuring up to 13 cm in length. Several additional subcentimeter hypodensities are too small to characterize. There is no hydronephrosis on either side. There is symmetric  enhancement and excretion of contrast by both kidneys. Mild bilateral perinephric stranding, nonspecific. Correlation with urinalysis recommended to exclude UTI. The visualized ureters and urinary bladder appear unremarkable. Stomach/Bowel: There is severe sigmoid diverticulosis without active inflammatory changes. There is no bowel obstruction or active inflammation. The appendix is normal. Vascular/Lymphatic: There is advanced aortoiliac atherosclerotic disease. 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 The graft appears patent. There is advanced atherosclerotic calcification of the common femoral arteries bilaterally, right greater left. The IVC is unremarkable. No portal venous gas. There is no adenopathy. Reproductive: Prostatectomy. 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 Musculoskeletal: There is degenerative changes of the  spine. No acute osseous pathology. IMPRESSION: 1. A 2.6 x 2.7 cm cavitary nodule in the superior segment of the right lower lobe may represent an abscess, an infected pneumatocele. Fungal infection, TB or cavitary neoplasm are not excluded. Clinical correlation and follow-up to resolution is recommended. 2. Emphysema with slight progression of the interstitial lung disease since the prior CT. 3. Fatty liver with possible early changes of cirrhosis. Elastography may provide better evaluation. 4. Gallbladder tumefactive sludge versus noncalcified stones. Diffuse gallbladder wall thickening and small pericholecystic fluid may be related to underlying liver disease although acute cholecystitis is not excluded. Further evaluation with right upper quadrant ultrasound recommended. 5. Severe sigmoid diverticulosis. No bowel obstruction. Normal appendix. 6. Aortic Atherosclerosis (ICD10-I70.0) and Emphysema (ICD10-J43.9). Electronically Signed   By: Anner Crete M.D.   On: 11/15/2019 19:11   CT Angio Chest PE W and/or Wo Contrast  Result Date: 12/04/2019 CLINICAL DATA:  COVID pneumonia, dyspnea, productive cough, unspecified abdominal pain EXAM: CT ANGIOGRAPHY CHEST CT ABDOMEN AND PELVIS WITH CONTRAST TECHNIQUE: Multidetector CT imaging of the chest was performed using the standard protocol during bolus administration of intravenous contrast. Multiplanar CT image reconstructions and MIPs were obtained to evaluate the vascular anatomy. Multidetector CT imaging of the abdomen and pelvis was performed using the standard protocol during bolus administration of intravenous contrast. CONTRAST:  32mL OMNIPAQUE IOHEXOL 350 MG/ML SOLN COMPARISON:  11/15/2019 FINDINGS: CTA CHEST FINDINGS Cardiovascular: There is excellent opacification of the pulmonary arterial tree. There is no intraluminal filling defect to suggest acute pulmonary embolism. There is borderline enlargement of the central pulmonary arteries suggesting mild  pulmonary arterial hypertension. There is extensive multi-vessel coronary artery calcification noted. Global cardiac size is within normal limits. Left ventricular hypertrophy is noted. There is mild calcification of the aortic valve leaflets noted. The thoracic aorta demonstrates mild calcification within the aortic arch. Thoracic aorta is of normal caliber. Mediastinum/Nodes: Multiple pathologically enlarged right hilar lymph nodes are identifiedl measuring up to 19 mm in short axis diameter. Lungs/Pleura: Since the prior examination, multifocal ground-glass pulmonary infiltrates and peripheral areas of consolidation have significantly progressed in keeping with progressive atypical infection and compatible with progressive changes of COVID-19 pneumonia. There Isq, however, a focal cavitary nodule identified within the superior segment of the right lower lobe measuring 2.5 x 2.5 cm, similar to that noted on prior examination, with associated pathologic right hilar adenopathy. Differential considerations are unchanged, though malignancy is strongly considered given the associated pathologic right hilar adenopathy. No pneumothorax or pleural effusion. No central obstructing mass. Bronchial wall thickening is again identified in keeping with airway inflammation. Musculoskeletal: No chest wall abnormality. No acute or significant osseous findings. Review of the MIP images confirms the above findings. CT ABDOMEN and PELVIS FINDINGS Hepatobiliary: Layering high density material  within the gallbladder lumen is in keeping with inspissated sludge or tiny gallstones. There is pneumobilia now identified within the biliary tree suggesting changes of interval sphincterotomy. No intra or extrahepatic biliary ductal dilation. The liver is unremarkable. Pancreas: Unremarkable Spleen: Unremarkable Adrenals/Urinary Tract: The adrenal glands are unremarkable. The kidneys are normal in size and position. There is mild left  hydronephrosis to the level of the ureteropelvic junction with decompressive the ureter thereafter in keeping with a moderate left UPJ obstruction. Superimposed parapelvic and cortical simple cysts are identified. There is symmetric enhancement of the left kidney however. Multiple simple cortical cysts are seen within the right kidney. No hydronephrosis on the right. No renal or ureteral calculi. The bladder is unremarkable. Stomach/Bowel: Moderate sigmoid diverticulosis is identified without superimposed inflammatory change. Scattered diverticula are seen within the ascending colon. Stomach, small bowel, and large bowel are otherwise unremarkable. Appendix normal. No free intraperitoneal gas or fluid Vascular/Lymphatic: Moderate soft mural plaque is identified within the distal descending thoracic aorta at the diaphragmatic hiatus. 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 Extensive atherosclerotic calcification is seen within the terminal lower extremity arterial inflow and visualized outflow. There is no pathologic adenopathy within the abdomen and pelvis. Reproductive: Status post prostatectomy. Other: Rectum unremarkable. Musculoskeletal: No lytic or blastic bone lesions within the abdomen. Review of the MIP images confirms the above findings. IMPRESSION: 1. No evidence of acute pulmonary embolism. 2. Significant interval progression of multifocal ground-glass pulmonary infiltrates and peripheral areas of consolidation in keeping with progressive atypical infection and compatible with COVID-19 pneumonia. 3. Persistent cavitary nodule within the superior segment of the right lower lobe, with associated pathologic right hilar adenopathy. Differential considerations are unchanged, though malignancy is strongly considered given the associated pathologic right hilar adenopathy. Short-term follow-up imaging or PET-CT examination may be helpful once the patient's acute issues have resolved for further  evaluation. 4. Interval probable sphincterotomy with pneumobilia. Cholelithiasis persists though superimposed pericholecystic inflammatory change has resolved. 5. Moderate left UPJ obstruction. 6. Diverticulosis without evidence of acute diverticulitis. Electronically Signed   By: Fidela Salisbury MD   On: 12/04/2019 20:02   NM Hepatobiliary Liver Func  Result Date: 11/17/2019 CLINICAL DATA:  Nausea and vomiting. Cholelithiasis and choledocholithiasis. EXAM: NUCLEAR MEDICINE HEPATOBILIARY IMAGING TECHNIQUE: Sequential images of the abdomen were obtained out to 60 minutes following intravenous administration of radiopharmaceutical. RADIOPHARMACEUTICALS:  5.5 mCi Tc-25m  Choletec IV COMPARISON:  None. FINDINGS: Prompt uptake and biliary excretion of activity by the liver is seen. Gallbladder activity is visualized, consistent with patency of cystic duct. Biliary activity passes into small bowel, consistent with patent common bile duct. Reflux of biliary activity into the stomach is noted. IMPRESSION: Patency of both cystic and common bile ducts is demonstrated. Bile reflux noted. Electronically Signed   By: Marlaine Hind M.D.   On: 11/17/2019 15:15   CT ABDOMEN PELVIS W CONTRAST  Result Date: 12/04/2019 CLINICAL DATA:  COVID pneumonia, dyspnea, productive cough, unspecified abdominal pain EXAM: CT ANGIOGRAPHY CHEST CT ABDOMEN AND PELVIS WITH CONTRAST TECHNIQUE: Multidetector CT imaging of the chest was performed using the standard protocol during bolus administration of intravenous contrast. Multiplanar CT image reconstructions and MIPs were obtained to evaluate the vascular anatomy. Multidetector CT imaging of the abdomen and pelvis was performed using the standard protocol during bolus administration of intravenous contrast. CONTRAST:  37mL OMNIPAQUE IOHEXOL 350 MG/ML SOLN COMPARISON:  11/15/2019 FINDINGS: CTA CHEST FINDINGS Cardiovascular: There is excellent opacification of the pulmonary arterial tree. There  is no  intraluminal filling defect to suggest acute pulmonary embolism. There is borderline enlargement of the central pulmonary arteries suggesting mild pulmonary arterial hypertension. There is extensive multi-vessel coronary artery calcification noted. Global cardiac size is within normal limits. Left ventricular hypertrophy is noted. There is mild calcification of the aortic valve leaflets noted. The thoracic aorta demonstrates mild calcification within the aortic arch. Thoracic aorta is of normal caliber. Mediastinum/Nodes: Multiple pathologically enlarged right hilar lymph nodes are identifiedl measuring up to 19 mm in short axis diameter. Lungs/Pleura: Since the prior examination, multifocal ground-glass pulmonary infiltrates and peripheral areas of consolidation have significantly progressed in keeping with progressive atypical infection and compatible with progressive changes of COVID-19 pneumonia. There Isq, however, a focal cavitary nodule identified within the superior segment of the right lower lobe measuring 2.5 x 2.5 cm, similar to that noted on prior examination, with associated pathologic right hilar adenopathy. Differential considerations are unchanged, though malignancy is strongly considered given the associated pathologic right hilar adenopathy. No pneumothorax or pleural effusion. No central obstructing mass. Bronchial wall thickening is again identified in keeping with airway inflammation. Musculoskeletal: No chest wall abnormality. No acute or significant osseous findings. Review of the MIP images confirms the above findings. CT ABDOMEN and PELVIS FINDINGS Hepatobiliary: Layering high density material within the gallbladder lumen is in keeping with inspissated sludge or tiny gallstones. There is pneumobilia now identified within the biliary tree suggesting changes of interval sphincterotomy. No intra or extrahepatic biliary ductal dilation. The liver is unremarkable. Pancreas: Unremarkable  Spleen: Unremarkable Adrenals/Urinary Tract: The adrenal glands are unremarkable. The kidneys are normal in size and position. There is mild left hydronephrosis to the level of the ureteropelvic junction with decompressive the ureter thereafter in keeping with a moderate left UPJ obstruction. Superimposed parapelvic and cortical simple cysts are identified. There is symmetric enhancement of the left kidney however. Multiple simple cortical cysts are seen within the right kidney. No hydronephrosis on the right. No renal or ureteral calculi. The bladder is unremarkable. Stomach/Bowel: Moderate sigmoid diverticulosis is identified without superimposed inflammatory change. Scattered diverticula are seen within the ascending colon. Stomach, small bowel, and large bowel are otherwise unremarkable. Appendix normal. No free intraperitoneal gas or fluid Vascular/Lymphatic: Moderate soft mural plaque is identified within the distal descending thoracic aorta at the diaphragmatic hiatus. 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 Extensive atherosclerotic calcification is seen within the terminal lower extremity arterial inflow and visualized outflow. There is no pathologic adenopathy within the abdomen and pelvis. Reproductive: Status post prostatectomy. Other: Rectum unremarkable. Musculoskeletal: No lytic or blastic bone lesions within the abdomen. Review of the MIP images confirms the above findings. IMPRESSION: 1. No evidence of acute pulmonary embolism. 2. Significant interval progression of multifocal ground-glass pulmonary infiltrates and peripheral areas of consolidation in keeping with progressive atypical infection and compatible with COVID-19 pneumonia. 3. Persistent cavitary nodule within the superior segment of the right lower lobe, with associated pathologic right hilar adenopathy. Differential considerations are unchanged, though malignancy is strongly considered given the associated pathologic right  hilar adenopathy. Short-term follow-up imaging or PET-CT examination may be helpful once the patient's acute issues have resolved for further evaluation. 4. Interval probable sphincterotomy with pneumobilia. Cholelithiasis persists though superimposed pericholecystic inflammatory change has resolved. 5. Moderate left UPJ obstruction. 6. Diverticulosis without evidence of acute diverticulitis. Electronically Signed   By: Fidela Salisbury MD   On: 12/04/2019 20:02   CT Abdomen Pelvis W Contrast  Result Date: 11/15/2019 CLINICAL DATA:  79 year old male with pneumonia.  Concern for abscess or effusion. EXAM: CT CHEST, ABDOMEN, AND PELVIS WITH CONTRAST TECHNIQUE: Multidetector CT imaging of the chest, abdomen and pelvis was performed following the standard protocol during bolus administration of intravenous contrast. CONTRAST:  149mL OMNIPAQUE IOHEXOL 300 MG/ML  SOLN COMPARISON:  Chest CT dated 02/14/2018. Chest radiograph dated 11/15/2019. FINDINGS: CT CHEST FINDINGS Cardiovascular: There is no cardiomegaly or pericardial effusion. Advanced 3 vessel coronary vascular calcification. There is advanced calcified and noncalcified plaque of the thoracic aorta. No aneurysmal dilatation or dissection. The central pulmonary arteries appear patent. Mediastinum/Nodes: Right hilar adenopathy measuring 13 mm. Subcarinal lymph node measures 10 mm in short axis. The esophagus and the thyroid gland are grossly unremarkable. No mediastinal fluid collection. Lungs/Pleura: There is background of emphysema. Bibasilar subpleural reticulation as seen on the prior CT in keeping with interstitial lung disease. Overall slight progression of the fibrotic changes since the prior CT. There is a 2.6 x 2.7 cm cavitary nodule in the superior segment of the right lower lobe which may represent an abscess, an infected pneumatocele. Fungal infection, TB or cavitary neoplasm are not excluded. Clinical correlation and follow-up to resolution is  recommended. Several scattered bilateral lower lobe nodular densities, present on the prior CT. No lobar consolidation, pleural effusion, or pneumothorax. The central airways are patent. Musculoskeletal: No chest wall mass or suspicious bone lesions identified. CT ABDOMEN PELVIS FINDINGS No intra-abdominal free air or free fluid. Hepatobiliary: There is fatty infiltration of the liver. Slight irregularity of the liver contour may represent early changes of cirrhosis. Elastography may provide better evaluation. There is tumefactive sludge versus noncalcified stones within the gallbladder. There is diffuse gallbladder wall thickening and small pericholecystic fluid. Findings may be related to underlying liver disease although acute cholecystitis is not excluded. Further evaluation with right upper quadrant ultrasound recommended. Pancreas: Unremarkable. No pancreatic ductal dilatation or surrounding inflammatory changes. Spleen: Normal in size without focal abnormality. Adrenals/Urinary Tract: The adrenal glands are unremarkable. Multiple bilateral renal cysts with the largest a lobulated appearing left renal interpolar cyst measuring up to 13 cm in length. Several additional subcentimeter hypodensities are too small to characterize. There is no hydronephrosis on either side. There is symmetric enhancement and excretion of contrast by both kidneys. Mild bilateral perinephric stranding, nonspecific. Correlation with urinalysis recommended to exclude UTI. The visualized ureters and urinary bladder appear unremarkable. Stomach/Bowel: There is severe sigmoid diverticulosis without active inflammatory changes. There is no bowel obstruction or active inflammation. The appendix is normal. Vascular/Lymphatic: There is advanced aortoiliac atherosclerotic disease. 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 The graft appears patent. There is advanced atherosclerotic  calcification of the common femoral arteries bilaterally, right greater left. The IVC is unremarkable. No portal venous gas. There is no adenopathy. Reproductive: Prostatectomy. 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 Musculoskeletal: There is degenerative changes of the spine. No acute osseous pathology. IMPRESSION: 1. A 2.6 x 2.7 cm cavitary nodule in the superior segment of the right lower lobe may represent an abscess, an infected pneumatocele. Fungal infection, TB or cavitary neoplasm are not excluded. Clinical correlation and follow-up to resolution is recommended. 2. Emphysema with slight progression of the interstitial lung disease since the prior CT. 3. Fatty liver with possible early changes of cirrhosis. Elastography may provide better evaluation. 4. Gallbladder tumefactive sludge versus noncalcified stones. Diffuse gallbladder wall thickening and small pericholecystic fluid may be related to underlying liver disease although acute cholecystitis is  not excluded. Further evaluation with right upper quadrant ultrasound recommended. 5. Severe sigmoid diverticulosis. No bowel obstruction. Normal appendix. 6. Aortic Atherosclerosis (ICD10-I70.0) and Emphysema (ICD10-J43.9). Electronically Signed   By: Anner Crete M.D.   On: 11/15/2019 19:11   US RENAL  Result Date: 11/18/2019 CLINICAL DATA:  Acute renal injury, COVID-19 positivity EXAM: RENAL / URINARY TRACT ULTRASOUND COMPLETE COMPARISON:  11/15/2019 FINDINGS: Right Kidney: Renal measurements: 12.3 x 5.1 x 4.3 cm. = volume: 140 mL. Less than 10 cysts are noted. The largest of these measures 2.7 cm in the midportion of the right kidney. Left Kidney: Renal measurements: 14.3 x 5.7 x 5.5 cm. = volume: 233 mL. Less than 10 cysts are noted. The largest of these measures 7.3 cm in greatest dimension. These are stable from the prior CT examination Bladder: Appears normal for degree of bladder distention. Other: Gallbladder is  partially distended with echogenic material within similar to that seen on prior exam consistent with gallbladder sludge and stones. IMPRESSION: Gallbladder sludge and stones. Bilateral renal cysts which are simple in nature. No obstructive changes are noted. Electronically Signed   By: Inez Catalina M.D.   On: 11/18/2019 03:03   MR ABDOMEN MRCP WO CONTRAST  Result Date: 11/17/2019 CLINICAL DATA:  Cholelithiasis EXAM: MRI ABDOMEN WITHOUT CONTRAST  (INCLUDING MRCP) TECHNIQUE: Multiplanar multisequence MR imaging of the abdomen was performed. Heavily T2-weighted images of the biliary and pancreatic ducts were obtained, and three-dimensional MRCP images were rendered by post processing. COMPARISON:  CT chest, abdomen and pelvis of November 15, 2019 FINDINGS: Lower chest: Small effusions at the lung bases. As basilar airspace disease and parenchymal findings demonstrated on the recent chest CT are not well evaluated on the MRI. Hepatobiliary: Mild hepatic steatosis. No focal, suspicious hepatic lesion, assessment limited by the noncontrast imaging. Pericholecystic stranding and signs of wall thickening. Mild biliary duct distension with numerous filling defects in the common bile duct compatible with multiple small stones. Largest approximately 5 mm. At least 4-5 additional small biliary calculi in the common bile duct. Pancreas:  No ductal dilation.  No peripancreatic inflammation. Spleen: Skin normal in size and contour without focal, suspicious lesion. Adrenals/Urinary Tract:  Adrenal glands are normal spleen. Bilateral renal cysts. Asymmetric albeit slightly Peri nephric stranding greater on the LEFT than the RIGHT. Dilated collecting systems in the upper pole and in the lower pole LEFT kidney perhaps due to mass-effect upon collecting systems in the setting of large renal sinus cysts with similar appearance to prior imaging studies. Stomach/Bowel: Stomach gastrointestinal tract with limited assessment, unremarkable  to the extent evaluated. 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 Other:  None. Musculoskeletal: No suspicious bone lesions identified. IMPRESSION: 1. Choledocholithiasis with mild biliary duct dilation as described. 2. Pericholecystic stranding and signs of wall thickening, remaining suspicious for acute cholecystitis. If there are discordant clinical findings, HIDA scan may be helpful for further assessment. 3. Renal sinus cysts and stable collecting system dilation on the LEFT with renal cysts on the RIGHT and mild perinephric stranding, nonspecific and similar to previous imaging studies. 4. Small effusions at the lung bases. 5. Mild hepatic steatosis. Electronically Signed   By: Zetta Bills M.D.   On: 11/17/2019 08:06   DG Chest Portable 1 View  Result Date: 12/04/2019 CLINICAL DATA:  Shortness of breath EXAM: PORTABLE CHEST 1 VIEW COMPARISON:  11/16/2019 FINDINGS: Patchy peripheral airspace opacities throughout the right lung and in the left lower lobe  concerning for pneumonia. Heart is normal size. No effusions or pneumothorax. No acute bony abnormality. IMPRESSION: Patchy peripheral airspace opacities, right greater than left concerning for pneumonia, possibly COVID pneumonia. Electronically Signed   By: Rolm Baptise M.D.   On: 12/04/2019 18:30   DG Chest Port 1 View  Result Date: 11/16/2019 CLINICAL DATA:  Shortness of breath and fever EXAM: PORTABLE CHEST 1 VIEW COMPARISON:  Chest radiograph and chest CT November 15, 2019 FINDINGS: There is a degree of underlying fibrosis. The cavitary lesion in the right lower lobe seen 1 day prior is less well seen by radiography although is evident, measuring 2.5 x 2.4 cm. There is a degree of lower lobe bronchiectatic change. No new opacity evident. Heart size and pulmonary vascularity are normal. Lymph node prominence seen on CT is not appreciable by radiography. There is aortic  atherosclerosis. No bone lesions appreciable. IMPRESSION: Cavitary lesion right lower lobe, much better seen on recent CT. This nodular lesion measures 2.5 x 2.4 cm on portable radiographic examination. There is an underlying degree of fibrosis in the bases. No new opacity evident. Cardiac silhouette stable. Aortic Atherosclerosis (ICD10-I70.0). Electronically Signed   By: Lowella Grip III M.D.   On: 11/16/2019 09:34   DG Chest Port 1 View  Result Date: 11/15/2019 CLINICAL DATA:  Evaluate for infection. EXAM: PORTABLE CHEST 1 VIEW COMPARISON:  07/27/2015 FINDINGS: Lungs are adequately inflated without effusion or pneumothorax. Mild increased density over the right hilum/perihilar region. Cardiomediastinal silhouette and remainder the exam is unchanged. IMPRESSION: Minimal increased density over the right hilar/perihilar region. Medial airspace process is possible. Consider PA and lateral chest radiograph for better evaluation. Electronically Signed   By: Marin Olp M.D.   On: 11/15/2019 17:04   DG ERCP BILIARY & PANCREATIC DUCTS  Result Date: 11/19/2019 CLINICAL DATA:  ERCP for bile duct stones. EXAM: ERCP TECHNIQUE: Multiple spot images obtained with the fluoroscopic device and submitted for interpretation post-procedure. COMPARISON:  MRCP-11/17/2019; nuclear medicine HIDA scan-11/17/2019 FINDINGS: Eight spot intraoperative fluoroscopic images of the right upper abdominal quadrant during ERCP are provided for review Initial image demonstrates an ERCP probe overlying the right upper abdominal quadrant Subsequent images demonstrate selective cannulation opacification of the common bile duct. There are several filling defects within the CBD, potentially representative of air bubbles versus choledocholithiasis. Subsequent images demonstrate insufflation of a balloon within the central aspect of the CBD with subsequent biliary sweeping and presumed sphincterotomy. There is minimal opacification the cystic  duct with passage of contrast to the level of the gallbladder lumen. There is minimal opacification intrahepatic biliary tree which appears nondilated. IMPRESSION: ERCP with biliary sweeping and presumed sphincterotomy as above. These images were submitted for radiologic interpretation only. Please see the procedural report for the amount of contrast and the fluoroscopy time utilized. Electronically Signed   By: Sandi Mariscal M.D.   On: 11/19/2019 07:40   VAS [place] LOWER EXTREMITY VENOUS (DVT)  Result Date: 12/07/2019  Lower Venous DVTStudy Indications: Elevated ddimer.  Comparison Study: 11/20/19 previous Performing Technologist: Abram Sander RVS  Examination Guidelines: A complete evaluation includes B-mode imaging, spectral Doppler, color Doppler, and power Doppler as needed of all accessible portions of each vessel. Bilateral testing is considered an integral part of a complete examination. Limited examinations for reoccurring indications may be performed as noted. The reflux portion of the exam is performed with the patient in reverse Trendelenburg.  +---------+---------------+---------+-----------+----------+--------------+ RIGHT    CompressibilityPhasicitySpontaneityPropertiesThrombus Aging +---------+---------------+---------+-----------+----------+--------------+ CFV      Full  Yes      Yes                                 +---------+---------------+---------+-----------+----------+--------------+ SFJ      Full                                                        +---------+---------------+---------+-----------+----------+--------------+ FV Prox  Full                                                        +---------+---------------+---------+-----------+----------+--------------+ FV Mid   Full                                                        +---------+---------------+---------+-----------+----------+--------------+ FV DistalFull                                                         +---------+---------------+---------+-----------+----------+--------------+ PFV      Full                                                        +---------+---------------+---------+-----------+----------+--------------+ POP      Full           Yes      Yes                                 +---------+---------------+---------+-----------+----------+--------------+ PTV      Full                                                        +---------+---------------+---------+-----------+----------+--------------+ PERO     Full                                                        +---------+---------------+---------+-----------+----------+--------------+   +---------+---------------+---------+-----------+----------+--------------+ LEFT     CompressibilityPhasicitySpontaneityPropertiesThrombus Aging +---------+---------------+---------+-----------+----------+--------------+ CFV      Full           Yes      Yes                                 +---------+---------------+---------+-----------+----------+--------------+ SFJ  Full                                                        +---------+---------------+---------+-----------+----------+--------------+ FV Prox  Full                                                        +---------+---------------+---------+-----------+----------+--------------+ FV Mid   Full                                                        +---------+---------------+---------+-----------+----------+--------------+ FV DistalFull                                                        +---------+---------------+---------+-----------+----------+--------------+ PFV      Full                                                        +---------+---------------+---------+-----------+----------+--------------+ POP      Full           Yes      Yes                                  +---------+---------------+---------+-----------+----------+--------------+ PTV      Full                                                        +---------+---------------+---------+-----------+----------+--------------+ PERO     Full                                                        +---------+---------------+---------+-----------+----------+--------------+     Summary: BILATERAL: - No evidence of deep vein thrombosis seen in the lower extremities, bilaterally. - No evidence of superficial venous thrombosis in the lower extremities, bilaterally. -   *See table(s) above for measurements and observations. Electronically signed by Monica Martinez MD on 12/07/2019 at 3:44:25 PM.    Final    VAS [place] LOWER EXTREMITY VENOUS (DVT)  Result Date: 11/21/2019  Lower Venous DVTStudy Indications: Elevated d-dimer.  Comparison Study: 07-27-2015 LEV study Performing Technologist: Darlin Coco  Examination Guidelines: A complete evaluation includes B-mode imaging, spectral Doppler, color Doppler, and power Doppler as needed of all accessible portions of each vessel. Bilateral testing is considered an integral part  of a complete examination. Limited examinations for reoccurring indications may be performed as noted. The reflux portion of the exam is performed with the patient in reverse Trendelenburg.  +---------+---------------+---------+-----------+----------+--------------+ RIGHT    CompressibilityPhasicitySpontaneityPropertiesThrombus Aging +---------+---------------+---------+-----------+----------+--------------+ CFV      Full           Yes      Yes                                 +---------+---------------+---------+-----------+----------+--------------+ SFJ      Full           Yes      Yes                                 +---------+---------------+---------+-----------+----------+--------------+ FV Prox  Full                                                         +---------+---------------+---------+-----------+----------+--------------+ FV Mid   Full                                                        +---------+---------------+---------+-----------+----------+--------------+ FV DistalFull                                                        +---------+---------------+---------+-----------+----------+--------------+ PFV      Full                                                        +---------+---------------+---------+-----------+----------+--------------+ POP      Full           Yes      Yes                                 +---------+---------------+---------+-----------+----------+--------------+ PTV      Full                                                        +---------+---------------+---------+-----------+----------+--------------+ PERO     Full                                                        +---------+---------------+---------+-----------+----------+--------------+   +---------+---------------+---------+-----------+----------+--------------+ LEFT     CompressibilityPhasicitySpontaneityPropertiesThrombus Aging +---------+---------------+---------+-----------+----------+--------------+ CFV      Full  Yes      Yes                                 +---------+---------------+---------+-----------+----------+--------------+ SFJ      Full           Yes      Yes                                 +---------+---------------+---------+-----------+----------+--------------+ FV Prox  Full                                                        +---------+---------------+---------+-----------+----------+--------------+ FV Mid   Full                                                        +---------+---------------+---------+-----------+----------+--------------+ FV DistalFull                                                         +---------+---------------+---------+-----------+----------+--------------+ PFV      Full                                                        +---------+---------------+---------+-----------+----------+--------------+ POP      Full           Yes      Yes                                 +---------+---------------+---------+-----------+----------+--------------+ PTV      Full                                                        +---------+---------------+---------+-----------+----------+--------------+ PERO     Full                                                        +---------+---------------+---------+-----------+----------+--------------+     Summary: RIGHT: - There is no evidence of deep vein thrombosis in the lower extremity.  - No cystic structure found in the popliteal fossa.  LEFT: - There is no evidence of deep vein thrombosis in the lower extremity.  - No cystic structure found in the popliteal fossa.  *See table(s) above for measurements and observations. Electronically signed by [name] MD on 11/21/2019 at 4:19:57 AM.    Final  ECHOCARDIOGRAM LIMITED  Result Date: 11/16/2019    ECHOCARDIOGRAM LIMITED REPORT   Patient Name:   Cody Moss Date of Exam: 11/16/2019 Medical Rec #:  [id number]         Height:       70.0 in Accession #:    [id number]        Weight:       187.8 lb Date of Birth:  [birth year]         BSA:          2.032 m Patient Age:    58 years          BP:           103/63 mmHg Patient Gender: M                 HR:           58 bpm. Exam Location:  Inpatient Procedure: Limited Echo, Color Doppler and Cardiac Doppler Indications:    CAD Native Vessel i25.10  History:        Patient has prior history of Echocardiogram examinations, most                 recent 07/29/2015. CAD; Risk Factors:Hypertension, Diabetes and                 Dyslipidemia. COVID+ at time of study.  Sonographer:    Raquel Sarna Senior RDCS Referring Phys: [place]  1. Left ventricular ejection fraction, by estimation, is 55 to 60%. The left ventricle has normal function. The left ventricle has no regional wall motion abnormalities. Left ventricular diastolic function could not be evaluated.  2. Right ventricular systolic function is normal. The right ventricular size is normal. There is mildly elevated pulmonary artery systolic pressure. The estimated right ventricular systolic pressure is 34.7 mmHg.  3. The mitral valve is grossly normal. Mild mitral valve regurgitation. No evidence of mitral stenosis.  4. The aortic valve is tricuspid. Aortic valve regurgitation is trivial. Mild aortic valve stenosis. Aortic valve mean gradient measures 9.9 mmHg. Aortic valve Vmax measures 2.15 m/s.  5. The inferior vena cava is dilated in size with <50% respiratory variability, suggesting right atrial pressure of 15 mmHg. Comparison(s): No significant change from prior study. FINDINGS  Left Ventricle: Left ventricular ejection fraction, by estimation, is 55 to 60%. The left ventricle has normal function. The left ventricle has no regional wall motion abnormalities. The left ventricular internal cavity size was normal in size. There is  no left ventricular hypertrophy. Right Ventricle: The right ventricular size is normal. No increase in right ventricular wall thickness. Right ventricular systolic function is normal. There is mildly elevated pulmonary artery systolic pressure. The tricuspid regurgitant velocity is 2.63  m/s, and with an assumed right atrial pressure of 15 mmHg, the estimated right ventricular systolic pressure is 42.5 mmHg. Left Atrium: Left atrial size was normal in size. Right Atrium: Right atrial size was normal in size. Pericardium: Trivial pericardial effusion is present. Presence of pericardial fat pad. Mitral Valve: The mitral valve is grossly normal. Mild mitral valve regurgitation. No evidence of mitral valve stenosis. Tricuspid Valve: The tricuspid  valve is grossly normal. Tricuspid valve regurgitation is trivial. No evidence of tricuspid stenosis. Aortic Valve: The aortic valve is tricuspid. Aortic valve regurgitation is trivial. Mild aortic stenosis is present. Aortic valve mean gradient measures 9.9 mmHg. Aortic valve peak gradient measures 18.4 mmHg. Aortic valve area, by VTI measures 1.26 cm.  Pulmonic Valve: The pulmonic valve was grossly normal. Pulmonic valve regurgitation is trivial. No evidence of pulmonic stenosis. Aorta: The aortic root is normal in size and structure. Venous: The inferior vena cava is dilated in size with less than 50% respiratory variability, suggesting right atrial pressure of 15 mmHg. IAS/Shunts: The atrial septum is grossly normal. LEFT VENTRICLE PLAX 2D LVOT diam:     2.10 cm LV SV:         64 LV SV Index:   31 LVOT Area:     3.46 cm  LV Volumes (MOD) LV vol d, MOD A4C: 145.0 ml LV vol s, MOD A4C: 67.7 ml LV SV MOD A4C:     145.0 ml RIGHT VENTRICLE RV S prime:     9.14 cm/s TAPSE (M-mode): 2.3 cm AORTIC VALVE AV Area (Vmax):    1.11 cm AV Area (Vmean):   1.14 cm AV Area (VTI):     1.26 cm AV Vmax:           214.63 cm/s AV Vmean:          148.039 cm/s AV VTI:            0.506 m AV Peak Grad:      18.4 mmHg AV Mean Grad:      9.9 mmHg LVOT Vmax:         68.50 cm/s LVOT Vmean:        48.900 cm/s LVOT VTI:          0.184 m LVOT/AV VTI ratio: 0.36 TRICUSPID VALVE TR Peak grad:   27.7 mmHg TR Vmax:        263.00 cm/s  SHUNTS Systemic VTI:  0.18 m Systemic Diam: 2.10 cm Eleonore Chiquito MD Electronically signed by Eleonore Chiquito MD Signature Date/Time: 11/16/2019/12:16:24 PM    Final    US Abdomen Limited RUQ  Result Date: 11/15/2019 CLINICAL DATA:  79 year old male with nausea vomiting. EXAM: ULTRASOUND ABDOMEN LIMITED RIGHT UPPER QUADRANT COMPARISON:  CT abdomen pelvis dated 11/15/2019. FINDINGS: Gallbladder: There is sludge and stone within the gallbladder. There is diffuse gallbladder wall thickening and edema and a small  pericholecystic fluid. Evaluation for sonographic Murphy's sign was limited as the patient was pre-medicated. Common bile duct: Diameter: 7 mm Liver: There is diffuse increased liver echogenicity most commonly seen in the setting of fatty infiltration. Superimposed inflammation or fibrosis is not excluded. Clinical correlation is recommended. Portal vein is patent on color Doppler imaging with normal direction of blood flow towards the liver. Other: None. IMPRESSION: 1. Gallbladder sludge and stones with diffuse edema as seen on the earlier CT. A hepatobiliary scintigraphy may provide better evaluation of the gallbladder if there is a high clinical concern for acute cholecystitis . 2. Fatty liver. Electronically Signed   By: Anner Crete M.D.   On: 11/15/2019 21:02     PERTINENT LAB RESULTS: CBC: Recent Labs    12/08/19 0108 12/08/19 0108 12/08/19 1438 12/09/19 0500  WBC 5.7  --   --  6.4  HGB 7.7*   < > 9.0* 8.2*  HCT 24.0*   < > 28.1* 25.9*  PLT 197  --   --  212   < > = values in this interval not displayed.   CMET CMP     Component Value Date/Time   NA 137 12/09/2019 0500   K 4.7 12/09/2019 0500   CL 100 12/09/2019 0500   CO2 28 12/09/2019 0500   GLUCOSE 261 (H) 12/09/2019 0500  BUN 35 (H) 12/09/2019 0500   CREATININE 1.18 12/09/2019 0500   CREATININE 1.17 03/18/2013 1307   CALCIUM 9.4 12/09/2019 0500   PROT 5.6 (L) 12/09/2019 0500   ALBUMIN 2.3 (L) 12/09/2019 0500   AST 14 (L) 12/09/2019 0500   ALT 14 12/09/2019 0500   ALKPHOS 33 (L) 12/09/2019 0500   BILITOT 0.4 12/09/2019 0500   GFRNONAA 60 (L) 12/09/2019 0500   GFRAA >60 12/09/2019 0500    GFR Estimated Creatinine Clearance: 55 mL/min (by C-G formula based on SCr of 1.18 mg/dL). No results for input(s): LIPASE, AMYLASE in the last 72 hours. No results for input(s): CKTOTAL, CKMB, CKMBINDEX, TROPONINI in the last 72 hours. Invalid input(s): POCBNP Recent Labs    12/07/19 0103  DDIMER 2.55*   No results  for input(s): HGBA1C in the last 72 hours. No results for input(s): CHOL, HDL, LDLCALC, TRIG, CHOLHDL, LDLDIRECT in the last 72 hours. No results for input(s): TSH, T4TOTAL, T3FREE, THYROIDAB in the last 72 hours.  Invalid input(s): FREET3 No results for input(s): VITAMINB12, FOLATE, FERRITIN, TIBC, IRON, RETICCTPCT in the last 72 hours. Coags: No results for input(s): INR in the last 72 hours.  Invalid input(s): PT Microbiology: Recent Results (from the past 240 hour(s))  Blood culture (routine x 2)     Status: Abnormal   Collection Time: 12/04/19  5:54 PM   Specimen: BLOOD  Result Value Ref Range Status   Specimen Description BLOOD RIGHT ANTECUBITAL  Final   Special Requests   Final    BOTTLES DRAWN AEROBIC AND ANAEROBIC Blood Culture adequate volume   Culture  Setup Time   Final    GRAM POSITIVE COCCI IN CLUSTERS ANAEROBIC BOTTLE ONLY CRITICAL RESULT CALLED TO, READ BACK BY AND VERIFIED WITH: J EAKINS,PHARMD AT 2056 12/05/19 BY L BENFIELD    Culture (A)  Final    STAPHYLOCOCCUS SPECIES (COAGULASE NEGATIVE) THE SIGNIFICANCE OF ISOLATING THIS ORGANISM FROM A SINGLE SET OF BLOOD CULTURES WHEN MULTIPLE SETS ARE DRAWN IS UNCERTAIN. PLEASE NOTIFY THE MICROBIOLOGY DEPARTMENT WITHIN ONE WEEK IF SPECIATION AND SENSITIVITIES ARE REQUIRED. Performed at [facility], [place] [address] [place], [place] [address]    Report Status 12/07/2019 FINAL  Final  Blood Culture ID Panel (Reflexed)     Status: Abnormal   Collection Time: 12/04/19  5:54 PM  Result Value Ref Range Status   Enterococcus faecalis NOT DETECTED NOT DETECTED Final   Enterococcus Faecium NOT DETECTED NOT DETECTED Final   Listeria monocytogenes NOT DETECTED NOT DETECTED Final   Staphylococcus species DETECTED (A) NOT DETECTED Final    Comment: CRITICAL RESULT CALLED TO, READ BACK BY AND VERIFIED WITH: J EAKINS,PHARMD AT 2056 12/05/19 BY L BENFIELD    Staphylococcus aureus (BCID) NOT DETECTED NOT DETECTED Final    Staphylococcus epidermidis DETECTED (A) NOT DETECTED Final    Comment: Methicillin (oxacillin) resistant coagulase negative staphylococcus. Possible blood culture contaminant (unless isolated from more than one blood culture draw or clinical case suggests pathogenicity). No antibiotic treatment is indicated for blood  culture contaminants. CRITICAL RESULT CALLED TO, READ BACK BY AND VERIFIED WITH: J EAKINS,PHARMD AT 2056 12/05/19 BY L BENFIELD    Staphylococcus lugdunensis NOT DETECTED NOT DETECTED Final   Streptococcus species NOT DETECTED NOT DETECTED Final   Streptococcus agalactiae NOT DETECTED NOT DETECTED Final   Streptococcus pneumoniae NOT DETECTED NOT DETECTED Final   Streptococcus pyogenes NOT DETECTED NOT DETECTED Final   A.calcoaceticus-baumannii NOT DETECTED NOT DETECTED Final   Bacteroides fragilis NOT  DETECTED NOT DETECTED Final   Enterobacterales NOT DETECTED NOT DETECTED Final   Enterobacter cloacae complex NOT DETECTED NOT DETECTED Final   Escherichia coli NOT DETECTED NOT DETECTED Final   Klebsiella aerogenes NOT DETECTED NOT DETECTED Final   Klebsiella oxytoca NOT DETECTED NOT DETECTED Final   Klebsiella pneumoniae NOT DETECTED NOT DETECTED Final   Proteus species NOT DETECTED NOT DETECTED Final   Salmonella species NOT DETECTED NOT DETECTED Final   Serratia marcescens NOT DETECTED NOT DETECTED Final   Haemophilus influenzae NOT DETECTED NOT DETECTED Final   Neisseria meningitidis NOT DETECTED NOT DETECTED Final   Pseudomonas aeruginosa NOT DETECTED NOT DETECTED Final   Stenotrophomonas maltophilia NOT DETECTED NOT DETECTED Final   Candida albicans NOT DETECTED NOT DETECTED Final   Candida auris NOT DETECTED NOT DETECTED Final   Candida glabrata NOT DETECTED NOT DETECTED Final   Candida krusei NOT DETECTED NOT DETECTED Final   Candida parapsilosis NOT DETECTED NOT DETECTED Final   Candida tropicalis NOT DETECTED NOT DETECTED Final   Cryptococcus neoformans/gattii  NOT DETECTED NOT DETECTED Final   Methicillin resistance mecA/C DETECTED (A) NOT DETECTED Final    Comment: CRITICAL RESULT CALLED TO, READ BACK BY AND VERIFIED WITH: J EAKINS,PHARMD AT 2056 12/05/19 BY L BENFIELD Performed at [facility], [address], [place], [place] [address]   Blood culture (routine x 2)     Status: None   Collection Time: 12/04/19  6:05 PM   Specimen: BLOOD RIGHT HAND  Result Value Ref Range Status   Specimen Description BLOOD RIGHT HAND  Final   Special Requests   Final    BOTTLES DRAWN AEROBIC AND ANAEROBIC Blood Culture adequate volume   Culture   Final    NO GROWTH 5 DAYS Performed at [facility], [place] [address], [place], [place] [address]    Report Status 12/09/2019 FINAL  Final  SARS Coronavirus 2 by RT PCR (hospital order, performed in [facility] hospital lab) Nasopharyngeal Nasopharyngeal Swab     Status: Abnormal   Collection Time: 12/07/19  9:27 AM   Specimen: Nasopharyngeal Swab  Result Value Ref Range Status   SARS Coronavirus 2 POSITIVE (A) NEGATIVE Final    Comment: RESULT CALLED TO, READ BACK BY AND VERIFIED WITH: RN Ashley Akin [id number] MLM (NOTE) SARS-CoV-2 target nucleic acids are DETECTED  SARS-CoV-2 RNA is generally detectable in upper respiratory specimens  during the acute phase of infection.  Positive results are indicative  of the presence of the identified virus, but do not rule out bacterial infection or co-infection with other pathogens not detected by the test.  Clinical correlation with patient history and  other diagnostic information is necessary to determine patient infection status.  The expected result is negative.  Fact Sheet for Patients:   StrictlyIdeas.no   Fact Sheet for Healthcare Providers:   BankingDealers.co.za    This test is not yet approved or cleared by the [place] FDA and  has been authorized for detection and/or diagnosis of SARS-CoV-2  by FDA under an Emergency Use Authorization (EUA).  This EUA will remain in effect (meaning this test can  be used) for the duration of  the COVID-19 declaration under Section 564(b)(1) of the Act, 21 U.S.C. section 360-bbb-3(b)(1), unless the authorization is terminated or revoked sooner.  Performed at [facility], [place] [address], [place], [place] [address]     FURTHER DISCHARGE INSTRUCTIONS:  Get Medicines reviewed and adjusted: Please take all your  medications with you for your next visit with your Primary MD  Laboratory/radiological data: Please request your Primary MD to go over all hospital tests and procedure/radiological results at the follow up, please ask your Primary MD to get all Hospital records sent to his/her office.  In some cases, they will be blood work, cultures and biopsy results pending at the time of your discharge. Please request that your primary care M.D. goes through all the records of your hospital data and follows up on these results.  Also Note the following: If you experience worsening of your admission symptoms, develop shortness of breath, life threatening emergency, suicidal or homicidal thoughts you must seek medical attention immediately by calling 911 or calling your MD immediately  if symptoms less severe.  You must read complete instructions/literature along with all the possible adverse reactions/side effects for all the Medicines you take and that have been prescribed to you. Take any new Medicines after you have completely understood and accpet all the possible adverse reactions/side effects.   Do not drive when taking Pain medications or sleeping medications (Benzodaizepines)  Do not take more than prescribed Pain, Sleep and Anxiety Medications. It is not advisable to combine anxiety,sleep and pain medications without talking with your primary care practitioner  Special Instructions: If you have smoked or chewed Tobacco  in the last 2  yrs please stop smoking, stop any regular Alcohol  and or any Recreational drug use.  Wear Seat belts while driving.  Please note: You were cared for by a hospitalist during your hospital stay. Once you are discharged, your primary care physician will handle any further medical issues. Please note that NO REFILLS for any discharge medications will be authorized once you are discharged, as it is imperative that you return to your primary care physician (or establish a relationship with a primary care physician if you do not have one) for your post hospital discharge needs so that they can reassess your need for medications and monitor your lab values.  Total Time spent coordinating discharge including counseling, education and face to face time equals 35 minutes.  SignedOren Binet 12/09/2019 11:39 AM

## 2019-12-09 NOTE — Progress Notes (Signed)
OT Cancellation Note  Patient Details Name: Cody Moss MRN: [id number] DOB: 11-Sep-1940   Cancelled Treatment:    Reason Eval/Treat Not Completed: Patient declined, no reason specified;Other (comment). Pt dressed, ready to discharge and declines therapy session. Nursing at bedside assisting pt/family with discharge paperwork.   Layla Maw 12/09/2019, 12:35 PM

## 2019-12-09 NOTE — Care Management Important Message (Signed)
Important Message  Patient Details  Name: Cody Moss MRN: [id number] Date of Birth: February 08, 1941   Medicare Important Message Given:  Yes  Patient left prior to IM delivery.  IM mailed to the patient address.     Tamecia Mcdougald 12/09/2019, 3:37 PM

## 2019-12-10 ENCOUNTER — Telehealth: Payer: Self-pay | Admitting: Internal Medicine

## 2019-12-10 NOTE — Telephone Encounter (Signed)
Spoke to patient's wife she stated husband has appointment with Dr.Hilty 8/26.Stated husband has been in hospital recently twice with covid.Stated he is better now but wanted to ask Dr.Hilty if 8/26 should be a my chart video visit.She also wanted to ask Dr.Hilty if ok for husband to restart Repatha.Stated he did not take Repatha while in hospital.Message sent to Dr.Hilty for advice.

## 2019-12-10 NOTE — Telephone Encounter (Signed)
MD advised he should wait another 1 month before he resumes Repatha. 12/17/19 appointment changed to virtual video visit.

## 2019-12-10 NOTE — Telephone Encounter (Signed)
Wife of the patient had questions about the patient's upcoming appointment 12/17/19. She wanted to know if the patient could still come in or if he needed to do a virtual visit.  The patient had two recent hospitalizations 07/25 and 08/13. The hospitalization 07/25 was covid related and then the hospitalization 08/13 the patient had Covid pneumonia and some other GI Issues. The wife states they are out of quarantine but she just wanted to double check what to do.  She also wanted to let Dr. Debara Pickett know that the patient has not had his repatha injection since 07/25

## 2019-12-13 ENCOUNTER — Encounter: Payer: Self-pay | Admitting: Gastroenterology

## 2019-12-13 NOTE — Progress Notes (Signed)
  Progress Note   Date: 12/10/2019  Patient Name: Cody Moss        MRN#: [id number]    Sphincterotomy with ERCP for stone extraction is inherent to the surgery.  The patient had this as a potential source of bleeding as he also had other etiologies found on endoscopy.  Multifactorial bleeding likely but has there was evidence of active oozing at the site this is felt to be probably the major contributor.  Justice Britain, MD [facility] Advanced Endoscopy Office # [phone]

## 2019-12-17 ENCOUNTER — Telehealth (INDEPENDENT_AMBULATORY_CARE_PROVIDER_SITE_OTHER): Payer: Medicare Other | Admitting: Internal Medicine

## 2019-12-17 ENCOUNTER — Encounter: Payer: Self-pay | Admitting: Internal Medicine

## 2019-12-17 VITALS — BP 109/69 | HR 98 | Wt 160.0 lb

## 2019-12-17 DIAGNOSIS — E785 Hyperlipidemia, unspecified: Secondary | ICD-10-CM

## 2019-12-17 DIAGNOSIS — I2510 Atherosclerotic heart disease of native coronary artery without angina pectoris: Secondary | ICD-10-CM | POA: Diagnosis not present

## 2019-12-17 DIAGNOSIS — Z8679 Personal history of other diseases of the circulatory system: Secondary | ICD-10-CM

## 2019-12-17 DIAGNOSIS — Z9889 Other specified postprocedural states: Secondary | ICD-10-CM

## 2019-12-17 MED ORDER — AMLODIPINE BESYLATE 5 MG PO TABS: 5.0000 mg | ORAL_TABLET | Freq: Every day | ORAL | 3 refills | Status: AC

## 2019-12-17 NOTE — Patient Instructions (Signed)
Medication Instructions:  HOLD Repatha for 1 month and them resume  *If you need a refill on your cardiac medications before your next appointment, please call your pharmacy*   Lab Work: FASTING lipid panel in 6 months to check cholesterol   If you have labs (blood work) drawn today and your tests are completely normal, you will receive your results only by: Marland Kitchen MyChart Message (if you have MyChart) OR . A paper copy in the mail If you have any lab test that is abnormal or we need to change your treatment, we will call you to review the results.   Testing/Procedures: NONE   Follow-Up: At [facility], you and your health needs are our priority.  As part of our continuing mission to provide you with exceptional heart care, we have created designated Provider Care Teams.  These Care Teams include your primary Cardiologist (physician) and Advanced Practice Providers (APPs -  Physician Assistants and Nurse Practitioners) who all work together to provide you with the care you need, when you need it.  We recommend signing up for the patient portal called "MyChart".  Sign up information is provided on this After Visit Summary.  MyChart is used to connect with patients for Virtual Visits (Telemedicine).  Patients are able to view lab/test results, encounter notes, upcoming appointments, etc.  Non-urgent messages can be sent to your provider as well.   To learn more about what you can do with MyChart, go to [email or web address].    Your next appointment:   6 months  The format for your next appointment:   In Person  Provider:   You may see Dr. Debara Pickett or one of the following Advanced Practice Providers on your designated Care Team:    Almyra Deforest, PA-C  Fabian Sharp, [place] or   Roby Lofts, [place]    Other Instructions
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
## 2019-12-22 NOTE — Progress Notes (Signed)
@Patient  ID: Cody Moss, male    DOB: 04/05/1941, 79 y.o.   MRN: [id number]  Chief Complaint  Patient presents with  . Follow-up    pt is post covid lost 37 lbs do to covid fatigue while walking.pt stills has sob when doing activities    Referring provider: [name], MD  HPI:  79 year old male former smoker followed in our office for postinflammatory pulmonary fibrosis, history of COVID-19 infection requiring hospitalization, cavitary mass in the right lower lobe  PMH: History of prostate cancer, status post AAA repair, hyperlipidemia, hypertension, GERD, type 2 diabetes Smoker/ Smoking History: Former smoker Maintenance:   Pt of: Dr. Melvyn Novas  12/23/2019  - Visit   79 year old male former smoker followed in our office by Dr. Melvyn Novas.  Presenting to office today as a hospital follow-up.  Patient was hospitalized was COVID-19 in August/2021.  Patient was discharged on 12/09/2019.  Summary of that discharge statement is listed below:  Brief Summary: See H&P, Labs, Consult and Test reports for all details in brief, patient is a 79 year old male with history of gout, HTN, DM-2, pulmonary fibrosis, CAD s/p PCI, GERD, HLD-recently hospitalized from 7/25-7/34 COVID-19 infection, cavitary lesion of the right lung, E. coli bacteremia and suspected cholangitis/choledocholithiasis-patient underwent ERCP/sphincterotomy with stone extraction-patient was eventually discharged on Augmentin/Zithromax-since discharge-patient continued to have some cough with productive phlegm-he was subsequently admitted to the emergency room-for ongoing Covid pneumonitis-and was also found to have upper GI bleeding with acute blood loss anemia.  See below for further details.  Brief Hospital Course: Acute Hypoxic Respiratory Failure secondary to COVID-19 pneumonia with inflammation/lung fibrosis-existing cavitary lung lesion: Hypoxia has improved-he was treated with steroids-he was s ambulated by the nursing  staff-does not require home O2.  O2 sats are stable on room air.  He was managed with steroids and supportive care-on discharge will continue steroids for a few more days..  He has completed a course of antimicrobial therapy for his cavitary lesion and Covid pneumonitis.  Note-no evidence of sepsis-clinical features not consistent with sepsis physiology.  Since patient is> 21 days from his initial positive test-patient does not require any further isolation.  Prior MD spoke with infectious disease-see prior documentation.  COVID-19 Labs:  Recent Labs (last 2 labs)        Recent Labs    12/07/19 0103 12/08/19 0108 12/09/19 0500  DDIMER 2.55*  --   --   CRP 3.2* 1.7* 1.3*      Recent Labs       Lab Results  Component Value Date   SARSCOV2NAA POSITIVE (A) 12/07/2019   SARSCOV2NAA POSITIVE (A) 11/15/2019   [place] Not Detected 07/17/2019   [place] NEGATIVE 05/01/2019       Upper GI bleeding with acute blood loss anemia: Did have melena-but has resolved-brown stools on the day of discharge.  Never required PRBC transfusion-hemoglobin fluctuating but relatively stable.  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  Recommendations from GI are to continue Protonix twice daily for 2 months-then daily indefinitely-and to continue Diflucan for total of 2 weeks.  GI will arrange for outpatient follow-up.  History of cavitary mass in the right lung: Completed a course of antimicrobial therapy-patient is aware that he needs to follow-up with his primary pulmonologist for outpatient PET/CT scan.  Prior  MD discussed with patient and daughter-they understand the importance of follow-up to rule out malignancy.  This MD this morning  reemphasized importance of follow-up with the patient.  History of choledocholithiasis-s/p ERCP: Patient/family aware that patient is going to require outpatient surgery follow-up.  Mildly elevated D-dimer: CTA chest/lower extremity Doppler negative for VTE.  Given GI bleeding-not a candidate for VTE prophylaxis.  Patient encouraged to ambulate.  CAD: No anginal symptoms-continue to hold aspirin due to GI bleeding-resume when okay with GI MD.  HTN: BP relatively stable-Avapro was discontinued due to hyperkalemia-we will switch to amlodipine on discharge.  Further optimization deferred to PCP.  DM-2: CBGs relatively stable-we will resume oral hypoglycemic agents on discharge.  Moderate left UPJ obstruction: Prior MD discussed with urologist-Dr. Particia Jasper reviewed CT imaging-no changes from his recent scan.  Recommendations were for outpatient follow-up with his primary urologist-Dr. Diona Fanti.  Patient completing follow-up with our office today.  Patient was admitted in July/2021 as well as in August/2021.  Followed by Dr. Melvyn Novas in the outpatient setting.  Previous CT imaging favored fibrotic NSIP.  Differential seen most recently is RB ILD versus early IPF.  Autoimmune and hypersensitivity pneumonitis panel from earlier this year was essentially negative.  Patient was treated with 2 weeks of antibiotics in July/2021 and requested to have repeat CT in 4 to 6 weeks.  Patient presenting to our office today.  Patient continues to have episodes of dyspnea as well as weight loss.  Patient has upcoming scheduled appointment with [facility] Dr. Ninfa Linden on 01/06/2020.  Likely will require outpatient surgery.  Patient was informed by the hospital staff that patient likely requires a PET scan to further evaluate right lower lobe cavitary mass.  Patient has upcoming follow-up with gastroenterology Dr. Bryan Lemma in October/2021.  He also has upcoming follow-up with Dr. Melvyn Novas in October/2021.  Questionaires /  Pulmonary Flowsheets:   ACT:  No flowsheet data found.  MMRC: mMRC Dyspnea Scale mMRC Score  12/23/2019 3    Epworth:  No flowsheet data found.  Tests:   11/15/2019-SARS-CoV-2-positive  11/15/2019-CT chest with contrast-background of emphysema, bibasilar subpleural reticulation is seen on prior CT in keeping with interstitial lung disease, slight progression of fibrotic changes since prior CT, there is a 2.6 x 2.7 m cavitary nodule in the superior segment of the right lower lobe which may represent an abscess, or infected pneumocele, fungal infection, TB or cavitary neoplasm are not excluded, clinical correlation and follow-up to resolution is recommended, several scattered bilateral lower lobe nodular densities present on prior CT  12/04/2019-CTA chest-multifocal groundglass pulmonary infiltrates and peripheral areas of consolidation of significantly progressed in keeping with progressive atypical infection and compatible with progressive changes of COVID-19 pneumonia, focal cavitary nodule identified within the superior segment of the right lower lobe measuring 2.5 cm x 2.5 cm, similar to that noted on prior exam with associated pathological right hilar adenopathy, differential considerations are unchanged though malignancy is strongly considered given the associated pathological right hilar adenopathy, no pneumothorax or pleural effusion, no central obstructing mass, bronchial wall thickening is again identified in keeping with airway inflammation  05/04/2019-pulmonary function test-FVC 3.26 (79% predicted), postbronchodilator ratio 79, postbronchodilator FEV1 2.78 (93% predicted), no positive bronchodilator response, DLCO 17.76 (72% predicted) Possible restriction, minimal diffusion defect  FENO:  No results found for: NITRICOXIDE  PFT: PFT Results Latest Ref Rng & Units 05/04/2019 05/02/2018 02/05/2017 02/20/2016  FVC-Pre L 3.26 3.59 3.64 3.94  FVC-Predicted Pre % 79 86 85 91  FVC-Post L  3.52  3.58 3.57 4.12  FVC-Predicted Post % 85 86 83 95  Pre FEV1/FVC % % 77 79 78 76  Post FEV1/FCV % % 79 81 81 77  FEV1-Pre L 2.51 2.86 2.83 2.98  FEV1-Predicted Pre % 84 95 90 94  FEV1-Post L 2.78 2.90 2.88 3.16  DLCO uncorrected ml/min/mmHg 17.76 19.61 18.43 19.48  DLCO UNC% % 72 61 57 60  DLCO corrected ml/min/mmHg - - 20.65 -  DLCO COR %Predicted % - - 63 -  DLVA Predicted % 84 74 79 67  TLC L 6.33 6.13 5.85 6.39  TLC % Predicted % 91 88 83 90  RV % Predicted % 122 106 90 106    WALK:  SIX MIN WALK 12/23/2019 08/06/2017 08/06/2016 02/06/2016  Medications - patient took his Metformin, Aspirin, and Metoprolol at 7:30 AM  - -  Supplimental Oxygen during Test? (L/min) - No No No  Laps - 8 - -  Partial Lap (in Meters) - 0 - -  Baseline BP (sitting) - 132/80 - -  Baseline Heartrate - 51 - -  Baseline Dyspnea (Borg Scale) - 0 - -  Baseline Fatigue (Borg Scale) - 0 - -  Baseline SPO2 - 99 - -  BP (sitting) - 138/78 - -  Heartrate - 67 - -  Dyspnea (Borg Scale) - 1 - -  Fatigue (Borg Scale) - 1 - -  SPO2 - 99 - -  BP (sitting) - 136/78 - -  Heartrate - 55 - -  SPO2 - 100 - -  Stopped or Paused before Six Minutes - No - -  Distance Completed - 384 - -  Tech Comments: pt walked at even fast pace with the assitance of walker no drop in oxygen saturation patient completed 384 laps in 6 minutes with no pausing or stopping. patient had no symptoms at the end of the test.  Patient was able to complete all 3 laps at a fast pace. Denied any signs of SOB or chest pains.  Pt. walked at a rapid steady pace to the best of his ability because he did state he was having back pain, he did not take any breaks    Imaging: DG Lumbar Spine 2-3 Views  Result Date: 12/04/2019 CLINICAL DATA:  Lumbosacral back pain. EXAM: LUMBAR SPINE - 2-3 VIEW COMPARISON:  Reformats from abdominal CT earlier today. FINDINGS: Normal alignment. Vertebral body heights are preserved. Significant disc space narrowing at  L4-L5 and L5-S1. Facet hypertrophy at these levels. Multilevel endplate spurring of the upper lumbar spine with preservation of disc spaces. No fracture or focal lesion. Retained excreted IV contrast within dilated left renal collecting system as seen on CT earlier today. There is excreted IV contrast in the urinary bladder. IMPRESSION: 1. 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 No acute osseous abnormality. 2. Retained excreted IV contrast within dilated left renal collecting system as seen on CT earlier today. Electronically Signed   By: Keith Rake M.D.   On: 12/04/2019 23:48   CT Angio Chest PE W and/or Wo Contrast  Result Date: 12/04/2019 CLINICAL DATA:  COVID pneumonia, dyspnea, productive cough, unspecified abdominal pain EXAM: CT ANGIOGRAPHY CHEST CT ABDOMEN AND PELVIS WITH CONTRAST TECHNIQUE: Multidetector CT imaging of the chest was performed using the standard protocol during bolus administration of intravenous contrast. Multiplanar CT image reconstructions and MIPs were obtained to evaluate the vascular anatomy. Multidetector CT imaging of the abdomen and pelvis was performed using the standard protocol  during bolus administration of intravenous contrast. CONTRAST:  93mL OMNIPAQUE IOHEXOL 350 MG/ML SOLN COMPARISON:  11/15/2019 FINDINGS: CTA CHEST FINDINGS Cardiovascular: There is excellent opacification of the pulmonary arterial tree. There is no intraluminal filling defect to suggest acute pulmonary embolism. There is borderline enlargement of the central pulmonary arteries suggesting mild pulmonary arterial hypertension. There is extensive multi-vessel coronary artery calcification noted. Global cardiac size is within normal limits. Left ventricular hypertrophy is noted. There is mild calcification of the aortic valve leaflets noted. The thoracic aorta demonstrates mild calcification within the aortic arch. Thoracic aorta is of normal caliber.  Mediastinum/Nodes: Multiple pathologically enlarged right hilar lymph nodes are identifiedl measuring up to 19 mm in short axis diameter. Lungs/Pleura: Since the prior examination, multifocal ground-glass pulmonary infiltrates and peripheral areas of consolidation have significantly progressed in keeping with progressive atypical infection and compatible with progressive changes of COVID-19 pneumonia. There Isq, however, a focal cavitary nodule identified within the superior segment of the right lower lobe measuring 2.5 x 2.5 cm, similar to that noted on prior examination, with associated pathologic right hilar adenopathy. Differential considerations are unchanged, though malignancy is strongly considered given the associated pathologic right hilar adenopathy. No pneumothorax or pleural effusion. No central obstructing mass. Bronchial wall thickening is again identified in keeping with airway inflammation. Musculoskeletal: No chest wall abnormality. No acute or significant osseous findings. Review of the MIP images confirms the above findings. CT ABDOMEN and PELVIS FINDINGS Hepatobiliary: Layering high density material within the gallbladder lumen is in keeping with inspissated sludge or tiny gallstones. There is pneumobilia now identified within the biliary tree suggesting changes of interval sphincterotomy. No intra or extrahepatic biliary ductal dilation. The liver is unremarkable. Pancreas: Unremarkable Spleen: Unremarkable Adrenals/Urinary Tract: The adrenal glands are unremarkable. The kidneys are normal in size and position. There is mild left hydronephrosis to the level of the ureteropelvic junction with decompressive the ureter thereafter in keeping with a moderate left UPJ obstruction. Superimposed parapelvic and cortical simple cysts are identified. There is symmetric enhancement of the left kidney however. Multiple simple cortical cysts are seen within the right kidney. No hydronephrosis on the right. No  renal or ureteral calculi. The bladder is unremarkable. Stomach/Bowel: Moderate sigmoid diverticulosis is identified without superimposed inflammatory change. Scattered diverticula are seen within the ascending colon. Stomach, small bowel, and large bowel are otherwise unremarkable. Appendix normal. No free intraperitoneal gas or fluid Vascular/Lymphatic: Moderate soft mural plaque is identified within the distal descending thoracic aorta at the diaphragmatic hiatus. 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 Extensive atherosclerotic calcification is seen within the terminal lower extremity arterial inflow and visualized outflow. There is no pathologic adenopathy within the abdomen and pelvis. Reproductive: Status post prostatectomy. Other: Rectum unremarkable. Musculoskeletal: No lytic or blastic bone lesions within the abdomen. Review of the MIP images confirms the above findings. IMPRESSION: 1. No evidence of acute pulmonary embolism. 2. Significant interval progression of multifocal ground-glass pulmonary infiltrates and peripheral areas of consolidation in keeping with progressive atypical infection and compatible with COVID-19 pneumonia. 3. Persistent cavitary nodule within the superior segment of the right lower lobe, with associated pathologic right hilar adenopathy. Differential considerations are unchanged, though malignancy is strongly considered given the associated pathologic right hilar adenopathy. Short-term follow-up imaging or PET-CT examination may be helpful once the patient's acute issues have resolved for further evaluation. 4. Interval probable sphincterotomy with pneumobilia. Cholelithiasis persists though superimposed pericholecystic inflammatory change has resolved. 5. Moderate left UPJ obstruction. 6. Diverticulosis without evidence of  acute diverticulitis. Electronically Signed   By: Fidela Salisbury MD   On: 12/04/2019 20:02   CT ABDOMEN PELVIS W CONTRAST  Result Date:  12/04/2019 CLINICAL DATA:  COVID pneumonia, dyspnea, productive cough, unspecified abdominal pain EXAM: CT ANGIOGRAPHY CHEST CT ABDOMEN AND PELVIS WITH CONTRAST TECHNIQUE: Multidetector CT imaging of the chest was performed using the standard protocol during bolus administration of intravenous contrast. Multiplanar CT image reconstructions and MIPs were obtained to evaluate the vascular anatomy. Multidetector CT imaging of the abdomen and pelvis was performed using the standard protocol during bolus administration of intravenous contrast. CONTRAST:  43mL OMNIPAQUE IOHEXOL 350 MG/ML SOLN COMPARISON:  11/15/2019 FINDINGS: CTA CHEST FINDINGS Cardiovascular: There is excellent opacification of the pulmonary arterial tree. There is no intraluminal filling defect to suggest acute pulmonary embolism. There is borderline enlargement of the central pulmonary arteries suggesting mild pulmonary arterial hypertension. There is extensive multi-vessel coronary artery calcification noted. Global cardiac size is within normal limits. Left ventricular hypertrophy is noted. There is mild calcification of the aortic valve leaflets noted. The thoracic aorta demonstrates mild calcification within the aortic arch. Thoracic aorta is of normal caliber. Mediastinum/Nodes: Multiple pathologically enlarged right hilar lymph nodes are identifiedl measuring up to 19 mm in short axis diameter. Lungs/Pleura: Since the prior examination, multifocal ground-glass pulmonary infiltrates and peripheral areas of consolidation have significantly progressed in keeping with progressive atypical infection and compatible with progressive changes of COVID-19 pneumonia. There Isq, however, a focal cavitary nodule identified within the superior segment of the right lower lobe measuring 2.5 x 2.5 cm, similar to that noted on prior examination, with associated pathologic right hilar adenopathy. Differential considerations are unchanged, though malignancy is  strongly considered given the associated pathologic right hilar adenopathy. No pneumothorax or pleural effusion. No central obstructing mass. Bronchial wall thickening is again identified in keeping with airway inflammation. Musculoskeletal: No chest wall abnormality. No acute or significant osseous findings. Review of the MIP images confirms the above findings. CT ABDOMEN and PELVIS FINDINGS Hepatobiliary: Layering high density material within the gallbladder lumen is in keeping with inspissated sludge or tiny gallstones. There is pneumobilia now identified within the biliary tree suggesting changes of interval sphincterotomy. No intra or extrahepatic biliary ductal dilation. The liver is unremarkable. Pancreas: Unremarkable Spleen: Unremarkable Adrenals/Urinary Tract: The adrenal glands are unremarkable. The kidneys are normal in size and position. There is mild left hydronephrosis to the level of the ureteropelvic junction with decompressive the ureter thereafter in keeping with a moderate left UPJ obstruction. Superimposed parapelvic and cortical simple cysts are identified. There is symmetric enhancement of the left kidney however. Multiple simple cortical cysts are seen within the right kidney. No hydronephrosis on the right. No renal or ureteral calculi. The bladder is unremarkable. Stomach/Bowel: Moderate sigmoid diverticulosis is identified without superimposed inflammatory change. Scattered diverticula are seen within the ascending colon. Stomach, small bowel, and large bowel are otherwise unremarkable. Appendix normal. No free intraperitoneal gas or fluid Vascular/Lymphatic: Moderate soft mural plaque is identified within the distal descending thoracic aorta at the diaphragmatic hiatus. 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 Extensive atherosclerotic calcification is seen within the terminal lower extremity arterial inflow and visualized outflow. There is no pathologic adenopathy within the  abdomen and pelvis. Reproductive: Status post prostatectomy. Other: Rectum unremarkable. Musculoskeletal: No lytic or blastic bone lesions within the abdomen. Review of the MIP images confirms the above findings. IMPRESSION: 1. No evidence of acute pulmonary embolism. 2. Significant interval progression of multifocal ground-glass pulmonary infiltrates  and peripheral areas of consolidation in keeping with progressive atypical infection and compatible with COVID-19 pneumonia. 3. Persistent cavitary nodule within the superior segment of the right lower lobe, with associated pathologic right hilar adenopathy. Differential considerations are unchanged, though malignancy is strongly considered given the associated pathologic right hilar adenopathy. Short-term follow-up imaging or PET-CT examination may be helpful once the patient's acute issues have resolved for further evaluation. 4. Interval probable sphincterotomy with pneumobilia. Cholelithiasis persists though superimposed pericholecystic inflammatory change has resolved. 5. Moderate left UPJ obstruction. 6. Diverticulosis without evidence of acute diverticulitis. Electronically Signed   By: Fidela Salisbury MD   On: 12/04/2019 20:02   DG Chest Portable 1 View  Result Date: 12/04/2019 CLINICAL DATA:  Shortness of breath EXAM: PORTABLE CHEST 1 VIEW COMPARISON:  11/16/2019 FINDINGS: Patchy peripheral airspace opacities throughout the right lung and in the left lower lobe concerning for pneumonia. Heart is normal size. No effusions or pneumothorax. No acute bony abnormality. IMPRESSION: Patchy peripheral airspace opacities, right greater than left concerning for pneumonia, possibly COVID pneumonia. Electronically Signed   By: Rolm Baptise M.D.   On: 12/04/2019 18:30   VAS [place] LOWER EXTREMITY VENOUS (DVT)  Result Date: 12/07/2019  Lower Venous DVTStudy Indications: Elevated ddimer.  Comparison Study: 11/20/19 previous Performing Technologist: Abram Sander RVS   Examination Guidelines: A complete evaluation includes B-mode imaging, spectral Doppler, color Doppler, and power Doppler as needed of all accessible portions of each vessel. Bilateral testing is considered an integral part of a complete examination. Limited examinations for reoccurring indications may be performed as noted. The reflux portion of the exam is performed with the patient in reverse Trendelenburg.  +---------+---------------+---------+-----------+----------+--------------+ RIGHT    CompressibilityPhasicitySpontaneityPropertiesThrombus Aging +---------+---------------+---------+-----------+----------+--------------+ CFV      Full           Yes      Yes                                 +---------+---------------+---------+-----------+----------+--------------+ SFJ      Full                                                        +---------+---------------+---------+-----------+----------+--------------+ FV Prox  Full                                                        +---------+---------------+---------+-----------+----------+--------------+ FV Mid   Full                                                        +---------+---------------+---------+-----------+----------+--------------+ FV DistalFull                                                        +---------+---------------+---------+-----------+----------+--------------+ PFV      Full                                                        +---------+---------------+---------+-----------+----------+--------------+  POP      Full           Yes      Yes                                 +---------+---------------+---------+-----------+----------+--------------+ PTV      Full                                                        +---------+---------------+---------+-----------+----------+--------------+ PERO     Full                                                         +---------+---------------+---------+-----------+----------+--------------+   +---------+---------------+---------+-----------+----------+--------------+ LEFT     CompressibilityPhasicitySpontaneityPropertiesThrombus Aging +---------+---------------+---------+-----------+----------+--------------+ CFV      Full           Yes      Yes                                 +---------+---------------+---------+-----------+----------+--------------+ SFJ      Full                                                        +---------+---------------+---------+-----------+----------+--------------+ FV Prox  Full                                                        +---------+---------------+---------+-----------+----------+--------------+ FV Mid   Full                                                        +---------+---------------+---------+-----------+----------+--------------+ FV DistalFull                                                        +---------+---------------+---------+-----------+----------+--------------+ PFV      Full                                                        +---------+---------------+---------+-----------+----------+--------------+ POP      Full           Yes      Yes                                 +---------+---------------+---------+-----------+----------+--------------+  PTV      Full                                                        +---------+---------------+---------+-----------+----------+--------------+ PERO     Full                                                        +---------+---------------+---------+-----------+----------+--------------+     Summary: BILATERAL: - No evidence of deep vein thrombosis seen in the lower extremities, bilaterally. - No evidence of superficial venous thrombosis in the lower extremities, bilaterally. -   *See table(s) above for measurements and observations. Electronically signed by  Monica Martinez MD on 12/07/2019 at 3:44:25 PM.    Final     Lab Results:  CBC    Component Value Date/Time   WBC 6.4 12/09/2019 0500   RBC 2.70 (L) 12/09/2019 0500   HGB 8.2 (L) 12/09/2019 0500   HCT 25.9 (L) 12/09/2019 0500   PLT 212 12/09/2019 0500   MCV 95.9 12/09/2019 0500   MCH 30.4 12/09/2019 0500   MCHC 31.7 12/09/2019 0500   RDW 15.0 12/09/2019 0500   LYMPHSABS 0.5 (L) 12/09/2019 0500   MONOABS 0.4 12/09/2019 0500   EOSABS 0.0 12/09/2019 0500   BASOSABS 0.0 12/09/2019 0500    BMET    Component Value Date/Time   NA 137 12/09/2019 0500   K 4.7 12/09/2019 0500   CL 100 12/09/2019 0500   CO2 28 12/09/2019 0500   GLUCOSE 261 (H) 12/09/2019 0500   BUN 35 (H) 12/09/2019 0500   CREATININE 1.18 12/09/2019 0500   CREATININE 1.17 03/18/2013 1307   CALCIUM 9.4 12/09/2019 0500   GFRNONAA 60 (L) 12/09/2019 0500   GFRAA >60 12/09/2019 0500    BNP    Component Value Date/Time   BNP 64.6 12/05/2019 0602    ProBNP No results found for: PROBNP  Specialty Problems      Pulmonary Problems   Postinflammatory pulmonary fibrosis ([place])    See CT abd 01/11/16 (incidental finding of pf)  - 02/06/2016  Walked RA x 3 laps @ 185 ft each stopped due to  End of study, fast pace, no sob or desat    - PFT's  02/20/16   FVC  3.94 (91%) s obst and s rx  prior to study with DLCO  60 % corrects to 67  % for alv volume   - 08/06/2016 Walked RA x 3 laps @ 185 ft each stopped due to  End of study, fast pace, no sob or desat    - HRCT 08/13/16 >>> The appearance of the chest is compatible with interstitial lung disease. 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 Findings are indeterminate for usual interstitial pneumonitis by ATS Fleischner criteria. - HRCT 02/14/2018  1. Fibrotic changes in the lungs appear stable compared to prior studies dating back to 08/14/2016, again classified as indeterminate for usual interstitial pneumonia (UIP) per ATS guidelines. 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 2. Mild diffuse bronchial wall thickening with mild centrilobular and paraseptal emphysema. 3. Aortic atherosclerosis, in addition to left main and 3 vessel coronary artery disease - PFT's  05/02/2018  FVC  3.59 (86%) s obst on no rx  prior to study with DLCO  61 % corrects to 74  % for alv volume   - Collagen vasc profile  05/11/19 : Neg - HSP profile 05/11/19 :  Neg         Multiple pulmonary nodules determined by computed tomography of lung    CT chest at [facility] 01/14/17 mpns largest 8 mm rml with interval growth since 01/11/16 > repeat CT 08/16/2017 >>> 2. Slight enlargement of an irregular right lower lobe nodule but still only 38mm  with a new adjacent right lower lobe nodule. Primary bronchogenic carcinoma cannot be excluded.  Since no significant change since 01/15/17 rec repeat in 6 months (placed in reminder file)      Cavitating mass in right lower lung lobe   Pneumonia due to COVID-19 virus   Acute respiratory failure with hypoxia (HCC)      Allergies  Allergen Reactions  . Flexeril [Cyclobenzaprine] Other (See Comments)    confusion  . Statins Other (See Comments)    MUSCLE CRAMPS  . Valium [Diazepam] Other (See Comments)    Talking out of his head  . Adhesive [Tape] Itching and Rash    Blisters, Please use "paper" tape  . Penicillins Rash    Has patient had a PCN  reaction causing immediate rash, facial/tongue/throat swelling, SOB or lightheadedness with hypotension: Unknown Has patient had a PCN reaction causing severe rash involving mucus membranes or skin necrosis: No Has patient had a PCN reaction that required hospitalization No Has patient had a PCN reaction occurring within the last 10 years: No If all of the above answers are "NO", then may proceed with Cephalosporin use. Patient tolerating piperacillin/tazobactam     Immunization History  Administered Date(s) Administered  . Influenza Whole 01/21/2017  . Influenza, High Dose Seasonal PF 01/21/2018  . Influenza-Unspecified 02/03/2019  . Pneumococcal Polysaccharide-23 12/08/2019  . Pneumococcal-Unspecified 12/07/2019    Past Medical History:  Diagnosis Date  . AKI (acute kidney injury) ([place]) 07/2015  . Cataract immature   . Coronary artery disease CARDIOLOGIST - DR LITTLE - LAST VISIT 05-30-2011-- WILL REQUEST NOTE, ECHO AND STRESS TEST   DENIES S & S  . Diabetes mellitus    "prediabetic", no meds  . Hx of radiation therapy 09/13/03 - 11/05/03   pelvis/prostate bed, Dr Cristela Felt  . Hyperlipidemia   . Hypertension   . Nocturia   . Peripheral vascular disease (HCC) S/P AAA AND AORTOBI-ILIAC BYPASS  . Phimosis   . Prostate cancer ([place]) 05/25/2002   prostatectomy  . S/P AAA repair 2002  . S/P angioplasty with stent     Tobacco History: Social History   Tobacco Use  Smoking Status Former Smoker  . Packs/day: 0.75  . Years: 50.00  . Pack years: 37.50  . Types: Cigarettes  . Quit date: 10/2019  . Years since quitting: 0.1  Smokeless Tobacco Never Used   Counseling given: Yes   Continue to not smoke  Outpatient Encounter Medications as of 12/23/2019  Medication Sig  . allopurinol (ZYLOPRIM) 300 MG tablet Take 300 mg by mouth daily.  Marland Kitchen amLODipine (NORVASC) 5 MG tablet Take 1 tablet (5 mg total) by mouth daily.  . bicalutamide (CASODEX) 50 MG tablet Take 50 mg by mouth at  bedtime.   . colchicine 0.6 MG tablet Take 1 tablet (0.6 mg total) by mouth daily. (Patient taking differently: Take 0.6 mg by mouth daily as needed (gout attacks). )  . Evolocumab (REPATHA SURECLICK) 431 MG/ML SOAJ Inject 1 Dose into the skin every 14 (fourteen) days. (Patient taking differently: Inject 140 mg into the skin every 14 (fourteen) days. )  . feeding supplement, GLUCERNA SHAKE, (GLUCERNA SHAKE) LIQD Take 237 mLs by mouth 3 (three) times daily between meals.  . fenofibrate 160 MG tablet Take 160 mg by mouth every evening.  . finasteride (PROSCAR) 5 MG tablet Take 5 mg by mouth at bedtime.   . metFORMIN (GLUCOPHAGE) 1000 MG tablet Take 0.5 tablets (500 mg total) by mouth 2 (two) times daily with a meal.  . oxyCODONE-acetaminophen (PERCOCET/ROXICET) 5-325 MG tablet Take 1-2 tablets by mouth every 6 (six) hours as needed (pain).   . pantoprazole (PROTONIX) 40 MG tablet Take 1 tablet (40 mg total) by mouth 2 (two) times daily before a meal. Twice daily dosing for 2 months, followed by daily dosing.  . triamcinolone cream (KENALOG) 0.1 % Apply 1 application topically 2 (two) times daily as needed (flare).   . [DISCONTINUED] fluconazole (DIFLUCAN) 200 MG tablet Take 1 tablet (200 mg total) by mouth daily for 13 days.  . [DISCONTINUED] saccharomyces boulardii (FLORASTOR) 250 MG capsule Take 1 capsule (250 mg total) by mouth 2 (two) times daily.   No facility-administered encounter medications on file as of 12/23/2019.     Review of Systems  Review of Systems  Constitutional: Positive for activity change, appetite change and fatigue. Negative for chills, fever and unexpected weight change.  HENT: Negative for postnasal drip, rhinorrhea, sinus pressure, sinus pain and sore throat.   Eyes: Negative.   Respiratory: Positive for cough and shortness of breath. Negative for wheezing.   Cardiovascular: Negative for chest pain and palpitations.  Gastrointestinal: Positive for abdominal distention  and abdominal pain. Negative for constipation, diarrhea, nausea and vomiting.  Endocrine: Negative.   Genitourinary: Negative.   Musculoskeletal: Negative.   Skin: Negative.   Neurological: Negative for dizziness and headaches.  Psychiatric/Behavioral: Negative.  Negative for dysphoric mood. The patient is not nervous/anxious.   All other systems reviewed and are negative.    Physical Exam  BP 128/78 (BP Location: Left Arm, Cuff Size: Normal)   Pulse 93   Temp 97.9 F (36.6 C) (Oral)   Ht 5\' 10"  (1.778 m)   Wt 165 lb 12.8 oz (75.2 kg)   SpO2 100%   BMI 23.79 kg/m   Wt Readings from Last 5 Encounters:  12/23/19 165 lb 12.8 oz (75.2 kg)  12/17/19 160 lb (72.6 kg)  12/07/19 172 lb (78 kg)  11/18/19 187 lb 13.3 oz (85.2 kg)  08/12/19 193 lb (87.5 kg)    BMI Readings from Last 5 Encounters:  12/23/19 23.79 kg/m  12/17/19 22.96 kg/m  12/07/19 24.68 kg/m  11/18/19 26.95 kg/m  08/12/19 27.30 kg/m   Discussed weight loss with patient.  Patient has no appetite.  Physical Exam Vitals and nursing note reviewed.  Constitutional:  General: He is not in acute distress.    Appearance: Normal appearance. He is normal weight. He is ill-appearing.     Comments: Frail elderly male  HENT:     Head: Normocephalic and atraumatic.     Right Ear: Hearing and external ear normal.     Left Ear: Hearing and external ear normal.     Nose: Nose normal. No mucosal edema or rhinorrhea.     Right Turbinates: Not enlarged.     Left Turbinates: Not enlarged.     Mouth/Throat:     Mouth: Mucous membranes are dry.     Pharynx: Oropharynx is clear. No oropharyngeal exudate.  Eyes:     Pupils: Pupils are equal, round, and reactive to light.  Cardiovascular:     Rate and Rhythm: Normal rate and regular rhythm.     Pulses: Normal pulses.     Heart sounds: Normal heart sounds. No murmur heard.   Pulmonary:     Effort: Pulmonary effort is normal.     Breath sounds: Examination of the  right-upper field reveals rales. Examination of the right-middle field reveals rales. Examination of the right-lower field reveals rales. Examination of the left-lower field reveals rales. Rales present. No decreased breath sounds, wheezing or rhonchi.  Abdominal:     General: Bowel sounds are normal. There is no distension.     Palpations: Abdomen is soft.     Tenderness: There is abdominal tenderness.  Musculoskeletal:     Cervical back: Normal range of motion.     Right lower leg: No edema.     Left lower leg: No edema.  Lymphadenopathy:     Cervical: No cervical adenopathy.  Skin:    General: Skin is warm and dry.     Capillary Refill: Capillary refill takes less than 2 seconds.     Findings: No erythema or rash.  Neurological:     General: No focal deficit present.     Mental Status: He is alert and oriented to person, place, and time.     Motor: Weakness present.     Coordination: Coordination normal.     Gait: Gait abnormal.  Psychiatric:        Mood and Affect: Mood normal.        Behavior: Behavior normal. Behavior is cooperative.        Thought Content: Thought content normal.        Judgment: Judgment normal.       Assessment & Plan:   Postinflammatory pulmonary fibrosis ([place]) Plan: Will order repeat pulmonary function testing Walk today in office stable without significant oxygen desaturations We will schedule close follow-up with Dr. Melvyn Novas in 2 weeks with our office  Pneumonia due to COVID-19 virus Recent COVID-19 diagnosis in July/2021 Entire family had Covid Patient was unvaccinated Patient required hospitalizations x2 due to [id number] complications Groundglass opacity seen on recent CT imaging Bibasilar rales, right greater than left on exam today Walk today in office stable  Plan: We will bring patient back in 2 weeks to assess follow-up We will discuss case with Dr. Melvyn Novas May need to consider repeat imaging, concerned about completing PET scan too  soon at this point in time would be whether or not it would provide an accurate result given ongoing inflammation/concern for infection We will order pulmonary function testing as patient is now post Covid  Acute respiratory failure with hypoxia [facility]) Plan: Walk today in office   Choledocholithiasis with acute cholecystitis with obstruction Plan: Keep  follow-up with [facility] Keep follow-up with gastroenterology  Cavitating mass in right lower lung lobe Discussed case with Dr. Melvyn Novas today We will hold off on PET scan at this point in time We will coordinate close follow-up to have patient come back into the office in 2 weeks to see Dr. Melvyn Novas    Abnormal findings on diagnostic imaging of lung Plan: We will reassess in 2 weeks with appointment with Dr. Melvyn Novas, may need to consider PET scan     Return in about 16 days (around 01/08/2020), or if symptoms worsen or fail to improve, for Follow up with Dr. Melvyn Novas.   Lauraine Rinne, NP 12/23/2019   This appointment required 44 minutes of patient care (this includes precharting, chart review, review of results, face-to-face care, etc.).

## 2019-12-23 ENCOUNTER — Encounter: Payer: Self-pay | Admitting: Pulmonary Disease

## 2019-12-23 ENCOUNTER — Encounter: Payer: Self-pay | Admitting: Vascular Surgery

## 2019-12-23 ENCOUNTER — Ambulatory Visit: Payer: Medicare Other | Admitting: Pulmonary Disease

## 2019-12-23 ENCOUNTER — Other Ambulatory Visit: Payer: Self-pay

## 2019-12-23 ENCOUNTER — Ambulatory Visit: Payer: Medicare Other | Admitting: Vascular Surgery

## 2019-12-23 VITALS — BP 128/78 | HR 93 | Temp 97.9°F | Ht 70.0 in | Wt 165.8 lb

## 2019-12-23 VITALS — BP 123/74 | HR 100 | Temp 98.7°F | Resp 20 | Ht 70.0 in | Wt 166.0 lb

## 2019-12-23 DIAGNOSIS — U071 COVID-19: Secondary | ICD-10-CM | POA: Diagnosis not present

## 2019-12-23 DIAGNOSIS — I739 Peripheral vascular disease, unspecified: Secondary | ICD-10-CM | POA: Diagnosis not present

## 2019-12-23 DIAGNOSIS — J1282 Pneumonia due to coronavirus disease 2019: Secondary | ICD-10-CM

## 2019-12-23 DIAGNOSIS — J984 Other disorders of lung: Secondary | ICD-10-CM | POA: Diagnosis not present

## 2019-12-23 DIAGNOSIS — R918 Other nonspecific abnormal finding of lung field: Secondary | ICD-10-CM | POA: Diagnosis not present

## 2019-12-23 DIAGNOSIS — K8043 Calculus of bile duct with acute cholecystitis with obstruction: Secondary | ICD-10-CM

## 2019-12-23 DIAGNOSIS — J9601 Acute respiratory failure with hypoxia: Secondary | ICD-10-CM

## 2019-12-23 DIAGNOSIS — J8410 Pulmonary fibrosis, unspecified: Secondary | ICD-10-CM

## 2019-12-23 NOTE — Assessment & Plan Note (Signed)
Plan: Walk today in office

## 2019-12-23 NOTE — Assessment & Plan Note (Signed)
Plan: Keep follow-up with [facility] Keep follow-up with gastroenterology

## 2019-12-23 NOTE — Assessment & Plan Note (Signed)
Plan: Will order repeat pulmonary function testing Walk today in office stable without significant oxygen desaturations We will schedule close follow-up with Dr. Melvyn Novas in 2 weeks with our office

## 2019-12-23 NOTE — Assessment & Plan Note (Signed)
Discussed case with Dr. Melvyn Novas today We will hold off on PET scan at this point in time We will coordinate close follow-up to have patient come back into the office in 2 weeks to see Dr. Melvyn Novas

## 2019-12-23 NOTE — Progress Notes (Signed)
REASON FOR CONSULT:    Peripheral vascular disease.  The consult is requested by Dr. Debara Pickett.  ASSESSMENT & PLAN:   PERIPHERAL VASCULAR DISEASE: This patient has a distal superficial femoral artery occlusion on the right based on his duplex scan.  He has stable claudication bilaterally.  He is just recovering from his Covid pneumonia and is somewhat weak with shortness of breath on exertion.  Currently his activity is more limited by shortness of breath and his leg pain.  He does not have any rest pain or evidence of critical limb ischemia.  I have ordered follow-up ABIs in 6 months and I will see him back at that time.  I encouraged him to resume his normal activities as best he can.  He quit smoking in July of this year after getting the Covid pneumonia.  I have encouraged him to stay off the cigarettes.  I explained we would only consider arteriography if his symptoms progress significantly or he develops rest pain or nonhealing ulcer.  Cody Mayo, MD Office: [phone]   HPI:   Cody Moss is a pleasant 79 y.o. male, who presents for evaluation of peripheral vascular disease.  Of note I repaired his abdominal aortic aneurysm with an aortoiliac graft in 2001.  I currently do not have those records.  He had a Covid pneumonia in July and was in the hospital but did not require a ventilator.  He is now recovering from this and doing well.  He has had stable claudication bilaterally for many years.  His symptoms are more significant on the right side.  He experiences right calf and thigh claudication which is brought on by ambulation and relieved with rest.  He denies any history of rest pain.  Has had no history of nonhealing wounds.  He does get some cramps in his legs at night.  His risk factors for peripheral vascular disease include type 2 diabetes, hypertension, hypercholesterolemia, and smoking.  He has no family history of premature cardiovascular disease.  Past Medical  History:  Diagnosis Date  . AKI (acute kidney injury) ([place]) 07/2015  . Cataract immature   . Coronary artery disease CARDIOLOGIST - DR LITTLE - LAST VISIT 05-30-2011-- WILL REQUEST NOTE, ECHO AND STRESS TEST   DENIES S & S  . Diabetes mellitus    "prediabetic", no meds  . Hx of radiation therapy 09/13/03 - 11/05/03   pelvis/prostate bed, Dr Cody Moss  . Hyperlipidemia   . Hypertension   . Nocturia   . Peripheral vascular disease (HCC) S/P AAA AND AORTOBI-ILIAC BYPASS  . Phimosis   . Prostate cancer ([place]) 05/25/2002   prostatectomy  . S/P AAA repair 2002  . S/P angioplasty with stent     Family History  Problem Relation Age of Onset  . Cancer Sister        half-sister, breast  . Colon cancer Neg Hx   . Stomach cancer Neg Hx   . Rectal cancer Neg Hx     SOCIAL HISTORY: Social History   Socioeconomic History  . Marital status: Married    Spouse name: Not on file  . Number of children: 7  . Years of education: Not on file  . Highest education level: Not on file  Occupational History  . Not on file  Tobacco Use  . Smoking status: Former Smoker    Packs/day: 0.75    Years: 50.00    Pack years: 37.50    Types: Cigarettes    Quit  date: 10/2019    Years since quitting: 0.1  . Smokeless tobacco: Never Used  Vaping Use  . Vaping Use: Never used  Substance and Sexual Activity  . Alcohol use: No  . Drug use: No  . Sexual activity: Not on file  Other Topics Concern  . Not on file  Social History Narrative   09/07/11 PSA 20.96      To begin androgen deprivation therapy, for possible breast radiation prior to this.         Married, Freight forwarder, 5 children   Social Determinants of Health   Financial Resource Strain:   . Difficulty of Paying Living Expenses: Not on file  Food Insecurity:   . Worried About Charity fundraiser in the Last Year: Not on file  . Ran Out of Food in the Last Year: Not on file  Transportation Needs:   . Lack of Transportation (Medical): Not on  file  . Lack of Transportation (Non-Medical): Not on file  Physical Activity:   . Days of Exercise per Week: Not on file  . Minutes of Exercise per Session: Not on file  Stress:   . Feeling of Stress : Not on file  Social Connections:   . Frequency of Communication with Friends and Family: Not on file  . Frequency of Social Gatherings with Friends and Family: Not on file  . Attends Religious Services: Not on file  . Active Member of Clubs or Organizations: Not on file  . Attends Archivist Meetings: Not on file  . Marital Status: Not on file  Intimate Partner Violence:   . Fear of Current or Ex-Partner: Not on file  . Emotionally Abused: Not on file  . Physically Abused: Not on file  . Sexually Abused: Not on file    Allergies  Allergen Reactions  . Flexeril [Cyclobenzaprine] Other (See Comments)    confusion  . Statins Other (See Comments)    MUSCLE CRAMPS  . Valium [Diazepam] Other (See Comments)    Talking out of his head  . Adhesive [Tape] Itching and Rash    Blisters, Please use "paper" tape  . Penicillins Rash    Has patient had a PCN reaction causing immediate rash, facial/tongue/throat swelling, SOB or lightheadedness with hypotension: Unknown Has patient had a PCN reaction causing severe rash involving mucus membranes or skin necrosis: No Has patient had a PCN reaction that required hospitalization No Has patient had a PCN reaction occurring within the last 10 years: No If all of the above answers are "NO", then may proceed with Cephalosporin use. Patient tolerating piperacillin/tazobactam     Current Outpatient Medications  Medication Sig Dispense Refill  . allopurinol (ZYLOPRIM) 300 MG tablet Take 300 mg by mouth daily.    Marland Kitchen amLODipine (NORVASC) 5 MG tablet Take 1 tablet (5 mg total) by mouth daily. 90 tablet 3  . bicalutamide (CASODEX) 50 MG tablet Take 50 mg by mouth at bedtime.     . colchicine 0.6 MG tablet Take 1 tablet (0.6 mg total) by mouth  daily. (Patient taking differently: Take 0.6 mg by mouth daily as needed (gout attacks). ) 30 tablet 0  . Evolocumab (REPATHA SURECLICK) 427 MG/ML SOAJ Inject 1 Dose into the skin every 14 (fourteen) days. (Patient taking differently: Inject 140 mg into the skin every 14 (fourteen) days. ) 2 pen 5  . feeding supplement, GLUCERNA SHAKE, (GLUCERNA SHAKE) LIQD Take 237 mLs by mouth 3 (three) times daily between meals. 21330 mL 0  .  fenofibrate 160 MG tablet Take 160 mg by mouth every evening.    . finasteride (PROSCAR) 5 MG tablet Take 5 mg by mouth at bedtime.     . metFORMIN (GLUCOPHAGE) 1000 MG tablet Take 0.5 tablets (500 mg total) by mouth 2 (two) times daily with a meal. 30 tablet 0  . oxyCODONE-acetaminophen (PERCOCET/ROXICET) 5-325 MG tablet Take 1-2 tablets by mouth every 6 (six) hours as needed (pain).     . pantoprazole (PROTONIX) 40 MG tablet Take 1 tablet (40 mg total) by mouth 2 (two) times daily before a meal. Twice daily dosing for 2 months, followed by daily dosing. 120 tablet 1  . triamcinolone cream (KENALOG) 0.1 % Apply 1 application topically 2 (two) times daily as needed (flare).      No current facility-administered medications for this visit.    REVIEW OF SYSTEMS:  [X]  denotes positive finding, [ ]  denotes negative finding Cardiac  Comments:  Chest pain or chest pressure:    Shortness of breath upon exertion: x   Short of breath when lying flat:    Irregular heart rhythm:        Vascular    Pain in calf, thigh, or hip brought on by ambulation: x   Pain in feet at night that wakes you up from your sleep:     Blood clot in your veins:    Leg swelling:         Pulmonary    Oxygen at home:    Productive cough:     Wheezing:         Neurologic    Sudden weakness in arms or legs:     Sudden numbness in arms or legs:     Sudden onset of difficulty speaking or slurred speech:    Temporary loss of vision in one eye:     Problems with dizziness:           Gastrointestinal    Blood in stool:     Vomited blood:         Genitourinary    Burning when urinating:     Blood in urine:        Psychiatric    Major depression:         Hematologic    Bleeding problems:    Problems with blood clotting too easily:        Skin    Rashes or ulcers:        Constitutional    Fever or chills:     PHYSICAL EXAM:   Vitals:   12/23/19 1457  BP: 123/74  Pulse: 100  Resp: 20  Temp: 98.7 F (37.1 C)  SpO2: 93%  Weight: 166 lb (75.3 kg)  Height: 5\' 10"  (1.778 m)    GENERAL: The patient is a well-nourished male, in no acute distress. The vital signs are documented above. CARDIAC: There is a regular rate and rhythm.  VASCULAR: I do not detect carotid bruits. On the right side he has a slightly diminished femoral pulse.  I cannot palpate popliteal or pedal pulses. On the left side he has a normal femoral pulse and palpable popliteal pulse.  I cannot palpate pedal pulses. He has no significant lower extremity swelling. PULMONARY: There is good air exchange bilaterally.  He has some mild rhonchi. ABDOMEN: Soft and non-tender with normal pitched bowel sounds.  He has a ventral hernia. MUSCULOSKELETAL: There are no major deformities or cyanosis. NEUROLOGIC: No focal weakness or paresthesias are detected.  SKIN: There are no ulcers or rashes noted. PSYCHIATRIC: The patient has a normal affect.  DATA:    VENOUS DUPLEX: I reviewed the venous duplex scan that was done on 11/21/2019.  This showed no evidence of DVT bilaterally.  The patient subsequently had a venous duplex scan on 12/07/2019 which also showed no evidence of DVT bilaterally.  ARTERIAL DOPPLER STUDY: I reviewed the arterial Doppler study that was done on 10/01/2019.  On the right side there was a dampened monophasic posterior tibial dorsalis pedis signal.  ABI was 60%.  Toe pressure was 34 mmHg.  On the left side there was a biphasic dorsalis pedis and posterior tibial signal.  ABI was  96%.  Toe pressure was 79 mmHg.  ARTERIAL DUPLEX: I reviewed the arterial duplex scan that was done on 10/01/2019.  This showed that the aortobiiliac bypass graft was patent.  ECHO: I reviewed the echo that was done on 11/16/2019.  This showed normal LV function with an ejection fraction of 55 to 60%.

## 2019-12-23 NOTE — Assessment & Plan Note (Signed)
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 Melvyn Novas May need to consider repeat imaging, concerned about completing PET scan too soon at this point in time would be whether or not it would provide an accurate result given ongoing inflammation/concern for infection We will order pulmonary function testing as patient is now post Covid

## 2019-12-23 NOTE — Assessment & Plan Note (Signed)
Plan: We will reassess in 2 weeks with appointment with Dr. Melvyn Novas, may need to consider PET scan

## 2019-12-23 NOTE — Patient Instructions (Addendum)
You were seen today by Lauraine Rinne, NP  for:   1. Pneumonia due to COVID-19 virus 2. Postinflammatory pulmonary fibrosis (HCC)  - Pulmonary function test; Future  We will plan on repeating a pulmonary function test with you in 12 weeks.  Walk today in office  3. Cavitating mass in right lower lung lobe 4. Abnormal findings on diagnostic imaging of lung  We will hold off on ordering a PET scan at this time  I will discuss case with Dr. Melvyn Novas.  He would like to see you sooner we have gone ahead and moved up your appointment to see him in September/2021  5. Acute respiratory failure with hypoxia (HCC)  Walk test in office  6. Choledocholithiasis with acute cholecystitis with obstruction  Keep upcoming appointment with Dr. Ninfa Linden   We recommend today:  Orders Placed This Encounter  Procedures  . Pulmonary function test    Standing Status:   Future    Standing Expiration Date:   12/22/2020    Order Specific Question:   Where should this test be performed?    Answer:   [facility]    Order Specific Question:   Full PFT: includes the following: basic spirometry, spirometry pre & post bronchodilator, diffusion capacity (DLCO), lung volumes    Answer:   Full PFT   Orders Placed This Encounter  Procedures  . Pulmonary function test   No orders of the defined types were placed in this encounter.   Follow Up:    Return in about 16 days (around 01/08/2020), or if symptoms worsen or fail to improve, for Follow up with Dr. Melvyn Novas.   Please do your part to reduce the spread of COVID-19:      Reduce your risk of any infection  and COVID19 by using the similar precautions used for avoiding the common cold or flu:  Marland Kitchen Wash your hands often with soap and warm water for at least 20 seconds.  If soap and water are not readily available, use an alcohol-based hand sanitizer with at least 60% alcohol.  . If coughing or sneezing, cover your mouth and nose by coughing or sneezing  into the elbow areas of your shirt or coat, into a tissue or into your sleeve (not your hands). Langley Gauss A MASK when in public  . Avoid shaking hands with others and consider head nods or verbal greetings only. . Avoid touching your eyes, nose, or mouth with unwashed hands.  . Avoid close contact with people who are sick. . Avoid places or events with large numbers of people in one location, like concerts or sporting events. . If you have some symptoms but not all symptoms, continue to monitor at home and seek medical attention if your symptoms worsen. . If you are having a medical emergency, call 911.   [place] / e-Visit: [email or web address]         [facility]: [facility]: [phone]                   [facility]: [phone]     It is flu season:   >>> Best ways to protect herself from the flu: Receive the yearly flu vaccine, practice good hand hygiene washing with soap and also using hand sanitizer when available, eat a nutritious meals, get adequate rest, hydrate appropriately   Please contact the office if your symptoms worsen or you have  concerns that you are not improving.   Thank you for choosing [facility] for your healthcare, and for allowing [place] to partner with you on your healthcare journey. I am thankful to be able to provide care to you today.   Wyn Quaker FNP-C

## 2019-12-25 ENCOUNTER — Other Ambulatory Visit: Payer: Self-pay | Admitting: *Deleted

## 2019-12-25 DIAGNOSIS — I739 Peripheral vascular disease, unspecified: Secondary | ICD-10-CM

## 2019-12-29 ENCOUNTER — Telehealth: Payer: Self-pay | Admitting: Internal Medicine

## 2019-12-29 NOTE — Telephone Encounter (Signed)
[facility] and spoke with [facility] who stated she went to see pt yesterday 12/28/19 and heard crackles in rt upper lobe. Pt does not have any increased SOB. Pt does have some slight wheezing.  Beth said that pt does have an albuterol inhaler that seems to have been provided for pt when he was discharged from the hospital but no Rx is on file and she said that she did not see anything mentioned about the inhaler in pt's discharge summary after hospital stay.  Beth wants to know what could be recommended and also if we can send Rx to pharmacy for albuterol HFA. Dr. Melvyn Novas, please advise.  Pt does have an upcoming appt 9/16 with MW.

## 2019-12-29 NOTE — Telephone Encounter (Signed)
Would only rx with albuterol if he really feels better p using it as has no role in treating PF

## 2019-12-29 NOTE — Telephone Encounter (Signed)
Called and  Spoke with Beth letting her know the info stated by MW and she verbalized understanding. She stated she will tell pt that he could use the inhaler that was provided for him by hospital and if it does help with pt's symptoms, we could send Rx to pharmacy if needed.Beth verbalized understanding and stated she would update pt with this info. Nothing further needed.

## 2020-01-05 ENCOUNTER — Encounter: Payer: Self-pay | Admitting: Family Medicine

## 2020-01-05 ENCOUNTER — Ambulatory Visit: Payer: Self-pay

## 2020-01-05 ENCOUNTER — Ambulatory Visit (INDEPENDENT_AMBULATORY_CARE_PROVIDER_SITE_OTHER): Payer: Medicare Other | Admitting: Family Medicine

## 2020-01-05 ENCOUNTER — Other Ambulatory Visit: Payer: Self-pay

## 2020-01-05 DIAGNOSIS — M533 Sacrococcygeal disorders, not elsewhere classified: Secondary | ICD-10-CM | POA: Diagnosis not present
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
## 2020-01-06 ENCOUNTER — Other Ambulatory Visit: Payer: Self-pay | Admitting: Surgery

## 2020-01-07 ENCOUNTER — Other Ambulatory Visit: Payer: Self-pay

## 2020-01-07 ENCOUNTER — Encounter: Payer: Self-pay | Admitting: Internal Medicine

## 2020-01-07 ENCOUNTER — Ambulatory Visit: Payer: Medicare Other | Admitting: Internal Medicine

## 2020-01-07 DIAGNOSIS — K8043 Calculus of bile duct with acute cholecystitis with obstruction: Secondary | ICD-10-CM

## 2020-01-07 DIAGNOSIS — J8410 Pulmonary fibrosis, unspecified: Secondary | ICD-10-CM | POA: Diagnosis not present

## 2020-01-07 DIAGNOSIS — J984 Other disorders of lung: Secondary | ICD-10-CM | POA: Diagnosis not present

## 2020-01-07 DIAGNOSIS — R918 Other nonspecific abnormal finding of lung field: Secondary | ICD-10-CM | POA: Diagnosis not present

## 2020-01-07 NOTE — Patient Instructions (Addendum)
Congratulations on not smoking   PET scan is the next  - we will schedule it and call you with the results    Would only recommend gall bladder surgery if it becomes urgent based on new symptoms

## 2020-01-07 NOTE — Assessment & Plan Note (Addendum)
Former smoker -  1st detected at admit for Covid 19 pna  11/15/19  2.6 x 2.7 cm cavitary nodule in the superior segment of the right lower lobe  -  CT 12/04/19   2.5 x 2.5 cm, similar to that noted on prior examination, with associated pathologic right hilar adenopathy  Worrisome for bronchognenic ca in former smoker though cough still be post infection cavity > PET scan at this point makes the most infection as well out 6 weeks beyond last known infection.  If PET shows dz limite to RLL would consider excisional bx followed by segmentectomy - if node pos and that's the only met then EBUS next step and hold off on T surgery consultation as he's not a great candidate for lung surgery anyway.

## 2020-01-07 NOTE — Progress Notes (Signed)
Subjective:    Patient ID: Cody Moss, male    DOB: 1940/05/11,    MRN: [id number]    Brief patient profile:  8 yowm quit moking Aug 2021 with prostate ca s/p prostatectomy / RT 2004 with serial f/u by Dr [name] who referred him to pulmonary clinic 02/06/2016 for eval of abn CT c/w PF in bases 01/11/16   No h/o chemotherapy  exposures,  ALI, macrodantin or Connective tissue dz or inflammatory bowel dz    History of Present Illness  02/06/2016 1st [facility] office visit/ Cody Moss   Chief Complaint  Patient presents with  . Advice Only    Referred by [name], Pt. states he had a bone density scan was told to follow up here, Pt. denies SOB, wheezing,chest pain or chest tightness, Pt. 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  Schedule a full set of PFTs at [facility] in the next 4 weeks  GERD diet     08/06/2016  f/u ov/Cody Moss re:  Active smoker / ? PF  Some cough due to pnds attributed to allergies/ rx zyrtec  Chief Complaint  Patient presents with  . 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 Follow-up    PFT' done today. He has some sinus congestion and "allergies"- taking zyrtec daily. He has a non prod cough. Breathing is unchanged since the last visit.    walking x 20 min s stopping slow  pace Cough attributes to pnds / better on zyrtec  Off gerd rx on maint basis x 3 weeks > no change  Cough or sob  rec GERD diet The key is to stop smoking completely before smoking completely stops you!     05/02/2018  f/u ov/Cody Moss re:  PF/ active smoker  Chief Complaint  Patient presents with  . Pulmonary Fibrosis    Breathing is doing well. Denies chest tightness, wheezing, SOB or coughing. PFT was completed today.  Dyspnea:  Walking 72mn mile around track never checks sats  Cough: none Sleeping: flat SABA use: none  02: none  rec Keep monitoring your 02 sats toward the end of your walk (not after you stop) and let me know if trending down The key is to stop smoking completely before smoking completely stops you!    11/15/19   COVID 19 pna    01/07/2020  Consultation req by Dr BNinfa Linden? Clear for surgery for GB dz  Chief Complaint  Patient presents with  . 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 Gravy last 24 hours and is generalized (points to both flanks as source of pain then just below xyphoid.    No obvious day to day or daytime variability or assoc excess/ purulent sputum or mucus plugs or hemoptysis or cp or chest tightness, subjective wheeze or overt sinus or hb symptoms.   Sleeping  without nocturnal  or early am exacerbation  of respiratory  c/o's or need for noct saba. Also denies any obvious fluctuation of symptoms with weather or environmental changes or other aggravating or alleviating factors except as outlined above   No unusual exposure hx or h/o childhood pna/ asthma or knowledge of premature birth.  Current Allergies, Complete Past Medical History, Past Surgical History, Family History, and Social History were  reviewed in [employer] record.  ROS  The following are not active complaints unless bolded Hoarseness, sore throat, dysphagia, dental problems, itching, sneezing,  nasal congestion or discharge of excess mucus or purulent secretions, ear ache,   fever, chills, sweats, unintended wt loss or wt gain, classically pleuritic or exertional cp,  orthopnea pnd or arm/hand swelling  or leg swelling, presyncope, palpitations, abdominal pain, anorexia, nausea, vomiting, diarrhea  or change in bowel habits or change in bladder habits, change in stools or change in urine, dysuria, hematuria,  rash, arthralgias, visual complaints, headache, numbness, weakness or ataxia or problems with walking or coordination,  change in mood or  memory.        Current Meds  Medication Sig  . allopurinol (ZYLOPRIM) 300 MG tablet Take 300 mg by mouth daily.  Marland Kitchen amLODipine (NORVASC) 5 MG tablet Take 1 tablet (5 mg total) by mouth daily.  . Blood Glucose Monitoring Suppl (ONE TOUCH ULTRA 2) w/Device KIT SMARTSIG:Via Meter  . colchicine 0.6 MG tablet Take 1 tablet (0.6 mg total) by mouth daily. (Patient taking differently: Take 0.6 mg by mouth daily as needed (gout attacks). )  . Evolocumab (REPATHA SURECLICK) 409 MG/ML SOAJ Inject 1 Dose into the skin every 14 (fourteen) days. (Patient taking differently: Inject 140 mg into the skin every 14 (fourteen) days. )  . feeding supplement, GLUCERNA SHAKE, (GLUCERNA SHAKE) LIQD Take 237 mLs by mouth 3 (three) times daily between meals.  . fenofibrate 160 MG tablet Take 160 mg by mouth every evening.  . finasteride (PROSCAR) 5 MG tablet Take 5 mg by mouth at bedtime.   . metFORMIN (GLUCOPHAGE) 1000 MG tablet Take 0.5 tablets (500 mg total) by mouth 2 (two) times daily with a meal.  . oxyCODONE-acetaminophen (PERCOCET/ROXICET) 5-325 MG tablet Take 1-2 tablets by mouth every 6 (six) hours as needed (pain).   . pantoprazole (PROTONIX) 40 MG tablet Take 1 tablet (40 mg  total) by mouth 2 (two) times daily before a meal. Twice daily dosing for 2 months, followed by daily dosing.  . triamcinolone cream (KENALOG) 0.1 % Apply 1 application topically 2 (two) times daily as needed (flare).                                           Objective:  Physical Exam   01/07/2020        169   05/11/19 192 lb (87.1 kg)  03/04/19 189 lb 9.6 oz (86 kg)  08/26/18 199 lb (90.3 kg)    Thin amb wm nad   Vital signs  reviewed  01/07/2020  - Note at rest 02 sats  96% on RA       HEENT : pt wearing mask not removed for exam due to covid -19 concerns.    NECK :  without JVD/Nodes/TM/ nl carotid upstrokes bilaterally   LUNGS: no acc muscle use,  Nl contour chest with minimal insp crackles  bilaterally without cough on insp or exp maneuvers   CV:  RRR  no s3 or murmur or increase in P2, and no edema   ABD:  Mod distended, minimally tender below xiphoid s guarding or rebound.   MS:  Nl gait/ ext warm without deformities, calf tenderness, cyanosis or clubbing No obvious joint restrictions   SKIN: warm and dry without lesions    NEURO:  alert, approp, nl sensorium with  no motor or cerebellar deficits apparent.          I personally reviewed images and agree with radiology impression as follows:   Chest CTa 12/04/19 1. No evidence of acute pulmonary embolism. 2. Significant interval progression of multifocal ground-glass pulmonary infiltrates and peripheral areas of consolidation in keeping with progressive atypical infection and compatible with COVID-19 pneumonia. 3. Persistent cavitary nodule within the superior segment of the right lower lobe, with associated pathologic right hilar adenopathy. Differential considerations are unchanged, though malignancy is strongly considered given the associated pathologic right hilar adenopathy. Short-term follow-up imaging or PET-CT examination may be helpful once the patient's acute issues have resolved  for further evaluation.

## 2020-01-07 NOTE — Assessment & Plan Note (Addendum)
See CT abd 01/11/16 (incidental finding of pf)  - 02/06/2016  Walked RA x 3 laps @ 185 ft each stopped due to  End of study, fast pace, no sob or desat    - PFT's  02/20/16   FVC  3.94 (91%) s obst and s rx  prior to study with DLCO  60 % corrects to 67  % for alv volume   - 08/06/2016 Walked RA x 3 laps @ 185 ft each stopped due to  End of study, fast pace, no sob or desat    - HRCT 08/13/16 >>> The appearance of the chest is compatible with interstitial lung disease. 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 Findings are indeterminate for usual interstitial pneumonitis by ATS Fleischner criteria. - HRCT 02/14/2018  1. Fibrotic changes in the lungs appear stable compared to prior studies dating back to 08/14/2016, again classified as indeterminate for usual interstitial pneumonia (UIP) per ATS guidelines. 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 2. Mild diffuse bronchial wall thickening with mild centrilobular and paraseptal emphysema. 3. Aortic atherosclerosis, in addition to left main and 3 vessel coronary artery disease - PFT's  05/02/2018  FVC  3.59 (86%) s obst on no rx  prior to study with DLCO  61 % corrects to 74  % for alv volume   - Collagen vasc profile  05/11/19 : Neg - HSP profile 05/11/19 :  Neg   Has recovered suprisingly well from covid at this point and hard to know to what extent has any  acute on chronic lung dz as the rsult of Fibroproiferative changes related to covid but these seem mild clinically.  There is no "red line" in terms of considering for elective cholecystectomy in terms of risk of fibrotic changes perioperatively but given the other concern for lung ca I would not proceed with gb surgery electively anyway.   Should his symptoms of gb flare then the surgery can be done on a more urgent basis with acceptable risk/benefit.   Discussed in detail all the  indications, usual  risks and alternatives  relative to the benefits with patient who agrees to proceed with w/u as outlined.

## 2020-01-07 NOTE — Assessment & Plan Note (Signed)
His abd pains are chronic and diffuse, not localized clinically to the GB, and he is mederately high  for surgery given his underlying lung dz and would not pursue this for now if it is considered totally elective.  If Dr Ninfa Linden feels the pain / clinical picture is more convincing of GB dz then certainly can go adhead with more urgent surgery at that point of do a cholecystostomy  to buy some time while working out his other issues.  Discussed in detail all the  indications, usual  risks and alternatives  relative to the benefits with patient who agrees to proceed with w/u as outlined.     Medical decision making was a moderate level of complexity in this case because of  > two chronic conditions /diagnoses requiring extra time for  H and P, chart review, counseling, *  and generating customized AVS unique to this office visit and charting.   Each maintenance medication was reviewed in detail including emphasizing most importantly the difference between maintenance and prns and under what circumstances the prns are to be triggered using an action plan format where appropriate. Please see avs for details which were reviewed in writing by both me and my nurse and patient given a written copy highlighted where appropriate with yellow highlighter for the patient's continued care at home along with an updated version of their medications.  Patient was asked to maintain medication reconciliation by comparing this list to the actual medications being used at home and to contact this office right away if there is a conflict or discrepancy.

## 2020-01-08 ENCOUNTER — Telehealth: Payer: Self-pay | Admitting: Internal Medicine

## 2020-01-08 ENCOUNTER — Ambulatory Visit: Payer: Medicare Other | Admitting: Internal Medicine

## 2020-01-08 DIAGNOSIS — J984 Other disorders of lung: Secondary | ICD-10-CM

## 2020-01-08 NOTE — Telephone Encounter (Signed)
Called and spoke with pt's wife letting her know that pt's appt which was scheduled 01/22/20 was not needed as pt was able to be seen for the sooner appt with MW 01/07/20 and she verbalized understanding. I have cancelled that appt.  While speaking with her, she wanted to know if Dr. Melvyn Novas was able to get anything worked out about pt having a PET scan performed. Dr. Melvyn Novas, please advise on this as I am not seeing an order that had been placed for PET scan. Please advise timing on when you were wanting pt to be scheduled for PET.  Patient Instructions by Tanda Rockers, MD at 01/07/2020 10:45 AM Author: Tanda Rockers, MD Author Type: Physician Filed: 01/07/2020 11:19 AM  Note Status: Addendum Cosign: Cosign Not Required Encounter Date: 01/07/2020  Editor: Tanda Rockers, MD (Physician)      Prior Versions: 1. Tanda Rockers, MD (Physician) at 01/07/2020 11:15 AM - Addendum   2. Tanda Rockers, MD (Physician) at 01/07/2020 11:13 AM - Signed      Congratulations on not smoking   PET scan is the next  - we will schedule it and call you with the results    Would only recommend gall bladder surgery if it becomes urgent based on new symptoms

## 2020-01-08 NOTE — Assessment & Plan Note (Signed)
Ordered PET

## 2020-01-08 NOTE — Telephone Encounter (Signed)
I ordered it

## 2020-01-11 NOTE — Telephone Encounter (Signed)
Spoke with patient's wife([name]) regarding prior message.Advised frances Dr.Wert ordered PET scan for patient.[name] voice was understanding.Nothing else further needed.

## 2020-01-18 ENCOUNTER — Other Ambulatory Visit: Payer: Self-pay

## 2020-01-18 ENCOUNTER — Ambulatory Visit (HOSPITAL_COMMUNITY)
Admission: RE | Admit: 2020-01-18 | Discharge: 2020-01-18 | Disposition: A | Discharge status: Home / Self Care | Payer: Medicare Other | Source: Ambulatory Visit | Attending: Internal Medicine | Admitting: Internal Medicine

## 2020-01-18 DIAGNOSIS — I700 Atherosclerosis of aorta: Secondary | ICD-10-CM | POA: Diagnosis not present

## 2020-01-18 DIAGNOSIS — C787 Secondary malignant neoplasm of liver and intrahepatic bile duct: Secondary | ICD-10-CM | POA: Insufficient documentation

## 2020-01-18 DIAGNOSIS — C7951 Secondary malignant neoplasm of bone: Secondary | ICD-10-CM | POA: Diagnosis not present

## 2020-01-18 DIAGNOSIS — J439 Emphysema, unspecified: Secondary | ICD-10-CM | POA: Insufficient documentation

## 2020-01-18 DIAGNOSIS — I2510 Atherosclerotic heart disease of native coronary artery without angina pectoris: Secondary | ICD-10-CM | POA: Diagnosis not present

## 2020-01-18 DIAGNOSIS — J984 Other disorders of lung: Secondary | ICD-10-CM

## 2020-01-18 LAB — GLUCOSE, CAPILLARY: Glucose-Capillary: 158 mg/dL — ABNORMAL HIGH (ref 70–99)

## 2020-01-18 MED ORDER — FLUDEOXYGLUCOSE F - 18 (FDG) INJECTION
8.6000 | Freq: Once | INTRAVENOUS | Status: AC | PRN
  Administered 2020-01-18: 8.6 via INTRAVENOUS

## 2020-01-19 ENCOUNTER — Other Ambulatory Visit: Payer: Self-pay | Admitting: Internal Medicine

## 2020-01-19 DIAGNOSIS — J984 Other disorders of lung: Secondary | ICD-10-CM

## 2020-01-19 NOTE — Progress Notes (Signed)
Discussed in detail all the  indications, usual  risks and alternatives  relative to the benefits with patient who agrees to proceed with w/u with liver bx

## 2020-01-20 ENCOUNTER — Encounter (HOSPITAL_COMMUNITY): Payer: Self-pay

## 2020-01-20 NOTE — Progress Notes (Signed)
Bethann Punches. 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   Dylan   Previous Messages  ----- Message -----  From: Lenore Cordia  Sent: 01/19/2020  3:24 PM EDT  To: Suzette Battiest, MD, [place] Procedure Requests  Subject: Biopsy                      Procedure Requested: US Biopsy (Liver)    Reason for Procedure: Liver Mets    Provider Requesting: Tanda Rockers   Provider Telephone: [phone]   Other Info:

## 2020-01-22 ENCOUNTER — Telehealth: Payer: Self-pay

## 2020-01-22 ENCOUNTER — Ambulatory Visit: Payer: Medicare Other | Admitting: Internal Medicine

## 2020-01-22 NOTE — Telephone Encounter (Signed)
   Primary Cardiologist: No primary care provider on file.  Chart reviewed as part of pre-operative protocol coverage. Given past medical history and time since last visit, based on ACC/AHA guidelines, Cody Moss would be at acceptable risk for the planned procedure without further cardiovascular testing.    I will route this recommendation to the requesting party via Epic fax function and remove from pre-op pool.  Please call with questions.  Jossie Ng. Mayvis Agudelo NP-C    01/22/2020, 4:36 PM [facility] [address] Office [phone] Fax [phone]

## 2020-01-22 NOTE — Telephone Encounter (Signed)
   [facility] Pre-operative Risk Assessment    Request for surgical clearance:  1. What type of surgery is being performed? LAPAROSCOPIC CHOLECYSTECOMY   2. When is this surgery scheduled? TBD   3. What type of clearance is required (medical clearance vs. Pharmacy clearance to hold med vs. Both)? MEDICAL  4. Are there any medications that need to be held prior to surgery and how long?NONE   5. Practice name and name of physician performing surgery? [facility] ATTN:CHEMIRA   6. What is the office phone number? [phone]   7.   What is the office fax number? [phone]  8.   Anesthesia type (None, local, MAC, general) ? GENERAL

## 2020-01-24 ENCOUNTER — Other Ambulatory Visit: Payer: Self-pay | Admitting: Internal Medicine

## 2020-01-28 ENCOUNTER — Other Ambulatory Visit: Payer: Self-pay | Admitting: Student

## 2020-01-28 ENCOUNTER — Other Ambulatory Visit: Payer: Self-pay | Admitting: Radiology

## 2020-01-29 ENCOUNTER — Ambulatory Visit (HOSPITAL_COMMUNITY)
Admission: RE | Admit: 2020-01-29 | Discharge: 2020-01-29 | Disposition: A | Discharge status: Home / Self Care | Payer: Medicare Other | Source: Ambulatory Visit | Attending: Internal Medicine | Admitting: Internal Medicine

## 2020-01-29 ENCOUNTER — Other Ambulatory Visit: Payer: Self-pay

## 2020-01-29 ENCOUNTER — Encounter (HOSPITAL_COMMUNITY): Payer: Self-pay

## 2020-01-29 DIAGNOSIS — Z7984 Long term (current) use of oral hypoglycemic drugs: Secondary | ICD-10-CM | POA: Diagnosis not present

## 2020-01-29 DIAGNOSIS — E785 Hyperlipidemia, unspecified: Secondary | ICD-10-CM | POA: Insufficient documentation

## 2020-01-29 DIAGNOSIS — J984 Other disorders of lung: Secondary | ICD-10-CM

## 2020-01-29 DIAGNOSIS — R160 Hepatomegaly, not elsewhere classified: Secondary | ICD-10-CM | POA: Diagnosis present

## 2020-01-29 DIAGNOSIS — Z8546 Personal history of malignant neoplasm of prostate: Secondary | ICD-10-CM | POA: Diagnosis not present

## 2020-01-29 DIAGNOSIS — E119 Type 2 diabetes mellitus without complications: Secondary | ICD-10-CM | POA: Insufficient documentation

## 2020-01-29 DIAGNOSIS — Z9079 Acquired absence of other genital organ(s): Secondary | ICD-10-CM | POA: Diagnosis not present

## 2020-01-29 DIAGNOSIS — Z8616 Personal history of COVID-19: Secondary | ICD-10-CM | POA: Insufficient documentation

## 2020-01-29 DIAGNOSIS — C3431 Malignant neoplasm of lower lobe, right bronchus or lung: Secondary | ICD-10-CM | POA: Insufficient documentation

## 2020-01-29 DIAGNOSIS — Z79899 Other long term (current) drug therapy: Secondary | ICD-10-CM | POA: Diagnosis not present

## 2020-01-29 DIAGNOSIS — I739 Peripheral vascular disease, unspecified: Secondary | ICD-10-CM | POA: Insufficient documentation

## 2020-01-29 DIAGNOSIS — I10 Essential (primary) hypertension: Secondary | ICD-10-CM | POA: Diagnosis not present

## 2020-01-29 DIAGNOSIS — I2510 Atherosclerotic heart disease of native coronary artery without angina pectoris: Secondary | ICD-10-CM | POA: Diagnosis not present

## 2020-01-29 DIAGNOSIS — J439 Emphysema, unspecified: Secondary | ICD-10-CM | POA: Diagnosis not present

## 2020-01-29 DIAGNOSIS — C787 Secondary malignant neoplasm of liver and intrahepatic bile duct: Secondary | ICD-10-CM | POA: Diagnosis not present

## 2020-01-29 DIAGNOSIS — I700 Atherosclerosis of aorta: Secondary | ICD-10-CM | POA: Insufficient documentation

## 2020-01-29 LAB — CBC
HCT: 30.7 % — ABNORMAL LOW (ref 39.0–52.0)
Hemoglobin: 9.3 g/dL — ABNORMAL LOW (ref 13.0–17.0)
MCH: 27.6 pg (ref 26.0–34.0)
MCHC: 30.3 g/dL (ref 30.0–36.0)
MCV: 91.1 fL (ref 80.0–100.0)
Platelets: 441 10*3/uL — ABNORMAL HIGH (ref 150–400)
RBC: 3.37 MIL/uL — ABNORMAL LOW (ref 4.22–5.81)
RDW: 15.5 % (ref 11.5–15.5)
WBC: 11 10*3/uL — ABNORMAL HIGH (ref 4.0–10.5)
nRBC: 0 % (ref 0.0–0.2)

## 2020-01-29 LAB — COMPREHENSIVE METABOLIC PANEL
ALT: 8 U/L (ref 0–44)
AST: 16 U/L (ref 15–41)
Albumin: 3.5 g/dL (ref 3.5–5.0)
Alkaline Phosphatase: 40 U/L (ref 38–126)
Anion gap: 15 (ref 5–15)
BUN: 20 mg/dL (ref 8–23)
CO2: 23 mmol/L (ref 22–32)
Calcium: 9.4 mg/dL (ref 8.9–10.3)
Chloride: 94 mmol/L — ABNORMAL LOW (ref 98–111)
Creatinine, Ser: 1.16 mg/dL (ref 0.61–1.24)
GFR, Estimated: >60 mL/min (ref >60)
Glucose, Bld: 135 mg/dL — ABNORMAL HIGH (ref 70–99)
Potassium: 3.9 mmol/L (ref 3.5–5.1)
Sodium: 132 mmol/L — ABNORMAL LOW (ref 135–145)
Total Bilirubin: 0.5 mg/dL (ref 0.3–1.2)
Total Protein: 7.7 g/dL (ref 6.5–8.1)

## 2020-01-29 LAB — PROTIME-INR
INR: 1.1 (ref 0.8–1.2)
Prothrombin Time: 14 seconds (ref 11.4–15.2)

## 2020-01-29 LAB — GLUCOSE, CAPILLARY: Glucose-Capillary: 137 mg/dL — ABNORMAL HIGH (ref 70–99)

## 2020-01-29 LAB — APTT: aPTT: 39 seconds — ABNORMAL HIGH (ref 24–36)

## 2020-01-29 MED ORDER — MIDAZOLAM HCL 2 MG/2ML IJ SOLN
INTRAMUSCULAR | Status: AC
  Filled 2020-01-29: qty 2

## 2020-01-29 MED ORDER — FENTANYL CITRATE (PF) 100 MCG/2ML IJ SOLN
INTRAMUSCULAR | Status: AC | PRN
  Administered 2020-01-29: 50 ug via INTRAVENOUS
  Administered 2020-01-29 (×2): 25 ug via INTRAVENOUS

## 2020-01-29 MED ORDER — LIDOCAINE HCL 1 % IJ SOLN
INTRAMUSCULAR | Status: AC
  Filled 2020-01-29: qty 20

## 2020-01-29 MED ORDER — MIDAZOLAM HCL 2 MG/2ML IJ SOLN
INTRAMUSCULAR | Status: AC | PRN
  Administered 2020-01-29 (×2): 1 mg via INTRAVENOUS
  Administered 2020-01-29: 0.5 mg via INTRAVENOUS

## 2020-01-29 MED ORDER — SODIUM CHLORIDE 0.9 % IV SOLN: INTRAVENOUS | Status: DC

## 2020-01-29 MED ORDER — GELATIN ABSORBABLE 12-7 MM EX MISC
CUTANEOUS | Status: AC
  Filled 2020-01-29: qty 1

## 2020-01-29 MED ORDER — FENTANYL CITRATE (PF) 100 MCG/2ML IJ SOLN
INTRAMUSCULAR | Status: AC
  Filled 2020-01-29: qty 2

## 2020-01-29 MED ORDER — LIDOCAINE HCL (PF) 1 % IJ SOLN
INTRAMUSCULAR | Status: AC | PRN
  Administered 2020-01-29: 10 mL via INTRADERMAL

## 2020-01-29 NOTE — Procedures (Signed)
Interventional Radiology Procedure Note  Procedure: Ultrasound guided focal liver biopsy  Findings: Please refer to procedural dictation for full description. Segment VI lesion biopsy.  18 ga core x3.  Samples sent to Pathology.  Complications: None immediate  Estimated Blood Loss: <10 mL  Recommendations: Bedrest for 3 hours.  Discharge home after bedrest if stable.   Ruthann Cancer, MD Pager: [phone]
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
## 2020-01-29 NOTE — Discharge Instructions (Signed)
Please call Interventional Radiology clinic [phone] with any questions or concerns.  You may remove your dressing and shower tomorrow.  Liver Biopsy, Care After These instructions give you information on caring for yourself after your procedure. Your doctor may also give you more specific instructions. Call your doctor if you have any problems or questions after your procedure. What can I expect after the procedure? After the procedure, it is common to have:  Pain and soreness where the biopsy was done.  Bruising around the area where the biopsy was done.  Sleepiness and be tired for a few days. Follow these instructions at home: Medicines  Take over-the-counter and prescription medicines only as told by your doctor.  If you were prescribed an antibiotic medicine, take it as told by your doctor. Do not stop taking the antibiotic even if you start to feel better.  Do not take medicines such as aspirin and ibuprofen. These medicines can thin your blood. Do not take these medicines unless your doctor tells you to take them.  If you are taking prescription pain medicine, take actions to prevent or treat constipation. Your doctor may recommend that you: ? Drink enough fluid to keep your pee (urine) clear or pale yellow. ? Take over-the-counter or prescription medicines. ? Eat foods that are high in fiber, such as fresh fruits and vegetables, whole grains, and beans. ? Limit foods that are high in fat and processed sugars, such as fried and sweet foods. Caring for your cut  Follow instructions from your doctor about how to take care of your cuts from surgery (incisions). Make sure you: ? Wash your hands with soap and water before you change your bandage (dressing). If you cannot use soap and water, use hand sanitizer. ? Change your bandage as told by your doctor. ? Leave stitches (sutures), skin glue, or skin tape (adhesive) strips in place. They may need to stay in place for 2 weeks  or longer. If tape strips get loose and curl up, you may trim the loose edges. Do not remove tape strips completely unless your doctor says it is okay.  Check your cuts every day for signs of infection. Check for: ? Redness, swelling, or more pain. ? Fluid or blood. ? Pus or a bad smell. ? Warmth.  Do not take baths, swim, or use a hot tub until your doctor says it is okay to do so. Activity   Rest at home for 1-2 days or as told by your doctor. ? Avoid sitting for a long time without moving. Get up to take short walks every 1-2 hours.  Return to your normal activities as told by your doctor. Ask what activities are safe for you.  Do not do these things in the first 24 hours: ? Drive. ? Use machinery. ? Take a bath or shower.  Do not lift more than 10 pounds (4.5 kg) or play contact sports for the first 2 weeks. General instructions   Do not drink alcohol in the first week after the procedure.  Have someone stay with you for at least 24 hours after the procedure.  Get your test results. Ask your doctor or the department that is doing the test: ? When will my results be ready? ? How will I get my results? ? What are my treatment options? ? What other tests do I need? ? What are my next steps?  Keep all follow-up visits as told by your doctor. This is important. Contact a doctor if:    A cut bleeds and leaves more than just a small spot of blood.  A cut is red, puffs up (swells), or hurts more than before.  Fluid or something else comes from a cut.  A cut smells bad.  You have a fever or chills. Get help right away if:  You have swelling, bloating, or pain in your belly (abdomen).  You get dizzy or faint.  You have a rash.  You feel sick to your stomach (nauseous) or throw up (vomit).  You have trouble breathing, feel short of breath, or feel faint.  Your chest hurts.  You have problems talking or seeing.  You have trouble with your balance or moving your  arms or legs. Summary  After the procedure, it is common to have pain, soreness, bruising, and tiredness.  Your doctor will tell you how to take care of yourself at home. Change your bandage, take your medicines, and limit your activities as told by your doctor.  Call your doctor if you have symptoms of infection. Get help right away if your belly swells, your cut bleeds a lot, or you have trouble talking or breathing. This information is not intended to replace advice given to you by your health care provider. Make sure you discuss any questions you have with your health care provider. Document Revised: 04/19/2017 Document Reviewed: 04/19/2017 Elsevier Patient Education  2020 Elsevier Inc.   Moderate Conscious Sedation, Adult, Care After These instructions provide you with information about caring for yourself after your procedure. Your health care provider may also give you more specific instructions. Your treatment has been planned according to current medical practices, but problems sometimes occur. Call your health care provider if you have any problems or questions after your procedure. What can I expect after the procedure? After your procedure, it is common:  To feel sleepy for several hours.  To feel clumsy and have poor balance for several hours.  To have poor judgment for several hours.  To vomit if you eat too soon. Follow these instructions at home: For at least 24 hours after the procedure:   Do not: ? Participate in activities where you could fall or become injured. ? Drive. ? Use heavy machinery. ? Drink alcohol. ? Take sleeping pills or medicines that cause drowsiness. ? Make important decisions or sign legal documents. ? Take care of children on your own.  Rest. Eating and drinking  Follow the diet recommended by your health care provider.  If you vomit: ? Drink water, juice, or soup when you can drink without vomiting. ? Make sure you have little or no  nausea before eating solid foods. General instructions  Have a responsible adult stay with you until you are awake and alert.  Take over-the-counter and prescription medicines only as told by your health care provider.  If you smoke, do not smoke without supervision.  Keep all follow-up visits as told by your health care provider. This is important. Contact a health care provider if:  You keep feeling nauseous or you keep vomiting.  You feel light-headed.  You develop a rash.  You have a fever. Get help right away if:  You have trouble breathing. This information is not intended to replace advice given to you by your health care provider. Make sure you discuss any questions you have with your health care provider. Document Revised: 03/22/2017 Document Reviewed: 07/30/2015 Elsevier Patient Education  2020 Elsevier Inc.  

## 2020-01-30 ENCOUNTER — Telehealth: Payer: Self-pay | Admitting: Pulmonary Disease

## 2020-01-30 NOTE — Telephone Encounter (Signed)
Patient's daughter Lenna Sciara called [phone] , tearful over the phone. Her father is in a lot of pain, diagnosed with metastatic cancer, liver biopsy done 10/8, results pending. He is on oxycodone already, taking CBD Gummies but still has persistent pain. She has made multiple attempts to reach PCP but they have not been helpful. He absolutely does not want to go to the emergency room where he was recently hospitalized with Covid. I explained to her that he likely needs a palliative care consult and perhaps escalation of his narcotic dosage but I am unable to do this over the phone. If he is in a pain crisis, then he would have to seek emergency room assessment. I spoke to the on-call palliative care physician and have left a message for [facility] liaison to see if they can help .

## 2020-02-03 NOTE — Telephone Encounter (Signed)
Dr. Melvyn Novas, please see pt's mychart message and advise.

## 2020-02-04 ENCOUNTER — Telehealth: Payer: Self-pay | Admitting: Internal Medicine

## 2020-02-04 LAB — SURGICAL PATHOLOGY

## 2020-02-04 NOTE — Telephone Encounter (Signed)
Called and spoke with pt's wife [name] who stated that they had a missed call from our office but are not sure who tried to call them as they said that no VM was left. [name] is wondering if Dr. Melvyn Novas might have tried to call them as they are still waiting on the biopsy results.  Dr. Melvyn Novas, please advise.

## 2020-02-05 ENCOUNTER — Telehealth: Payer: Self-pay | Admitting: Internal Medicine

## 2020-02-05 ENCOUNTER — Encounter: Payer: Self-pay | Admitting: *Deleted

## 2020-02-05 DIAGNOSIS — J984 Other disorders of lung: Secondary | ICD-10-CM

## 2020-02-05 NOTE — Telephone Encounter (Signed)
Done - see result note

## 2020-02-05 NOTE — Telephone Encounter (Signed)
Cody Moss (wife) returning a phone call about oncology referral. [name](wife) can be reached at [phone].

## 2020-02-05 NOTE — Progress Notes (Signed)
Referral made 

## 2020-02-05 NOTE — Progress Notes (Signed)
I received referral on Mr. Birchmeier today.  I notified scheduling to call him to be seen on Monday Oc 18th with labs.

## 2020-02-05 NOTE — Telephone Encounter (Signed)
Spoke with [name] and notified that the referral was marked urgent and was placed early this am  I advised someone will call them to set up the appt  Nothing further needed

## 2020-02-05 NOTE — Telephone Encounter (Signed)
Spoke to pt, not on duragesic 25 and prn oxy and doing much better, nothing further to add for now

## 2020-02-05 NOTE — Telephone Encounter (Signed)
Scheduled appointment per Suanne Marker message from nurse navigator Hinton Dyer and new patient referral from 10/15. Spoke to patient's wife who is aware of appointment date and time.

## 2020-02-05 NOTE — Telephone Encounter (Signed)
Urgent referral to oncology was made- I called Missy but had to [facility]

## 2020-02-08 ENCOUNTER — Inpatient Hospital Stay: Payer: Medicare Other | Attending: Internal Medicine

## 2020-02-08 ENCOUNTER — Encounter: Payer: Self-pay | Admitting: *Deleted

## 2020-02-08 ENCOUNTER — Encounter: Payer: Self-pay | Admitting: Internal Medicine

## 2020-02-08 ENCOUNTER — Inpatient Hospital Stay (HOSPITAL_BASED_OUTPATIENT_CLINIC_OR_DEPARTMENT_OTHER): Payer: Medicare Other | Admitting: Internal Medicine

## 2020-02-08 ENCOUNTER — Other Ambulatory Visit: Payer: Self-pay | Admitting: Medical Oncology

## 2020-02-08 ENCOUNTER — Other Ambulatory Visit: Payer: Self-pay

## 2020-02-08 DIAGNOSIS — Z5112 Encounter for antineoplastic immunotherapy: Secondary | ICD-10-CM | POA: Diagnosis not present

## 2020-02-08 DIAGNOSIS — C3490 Malignant neoplasm of unspecified part of unspecified bronchus or lung: Secondary | ICD-10-CM | POA: Diagnosis not present

## 2020-02-08 DIAGNOSIS — C3431 Malignant neoplasm of lower lobe, right bronchus or lung: Secondary | ICD-10-CM | POA: Insufficient documentation

## 2020-02-08 DIAGNOSIS — J984 Other disorders of lung: Secondary | ICD-10-CM

## 2020-02-08 DIAGNOSIS — C787 Secondary malignant neoplasm of liver and intrahepatic bile duct: Secondary | ICD-10-CM | POA: Diagnosis not present

## 2020-02-08 DIAGNOSIS — Z5111 Encounter for antineoplastic chemotherapy: Secondary | ICD-10-CM | POA: Diagnosis not present

## 2020-02-08 DIAGNOSIS — C7951 Secondary malignant neoplasm of bone: Secondary | ICD-10-CM | POA: Insufficient documentation

## 2020-02-08 DIAGNOSIS — Z7189 Other specified counseling: Secondary | ICD-10-CM

## 2020-02-08 DIAGNOSIS — Z87891 Personal history of nicotine dependence: Secondary | ICD-10-CM | POA: Insufficient documentation

## 2020-02-08 DIAGNOSIS — C3491 Malignant neoplasm of unspecified part of right bronchus or lung: Secondary | ICD-10-CM | POA: Insufficient documentation

## 2020-02-08 LAB — CMP (CANCER CENTER ONLY)
ALT: <6 U/L (ref 0–44)
AST: 8 U/L — ABNORMAL LOW (ref 15–41)
Albumin: 2.6 g/dL — ABNORMAL LOW (ref 3.5–5.0)
Alkaline Phosphatase: 53 U/L (ref 38–126)
Anion gap: 7 (ref 5–15)
BUN: 18 mg/dL (ref 8–23)
CO2: 29 mmol/L (ref 22–32)
Calcium: 10.5 mg/dL — ABNORMAL HIGH (ref 8.9–10.3)
Chloride: 97 mmol/L — ABNORMAL LOW (ref 98–111)
Creatinine: 1 mg/dL (ref 0.61–1.24)
GFR, Estimated: >60 mL/min (ref >60)
Glucose, Bld: 147 mg/dL — ABNORMAL HIGH (ref 70–99)
Potassium: 5.1 mmol/L (ref 3.5–5.1)
Sodium: 133 mmol/L — ABNORMAL LOW (ref 135–145)
Total Bilirubin: 0.3 mg/dL (ref 0.3–1.2)
Total Protein: 7.4 g/dL (ref 6.5–8.1)

## 2020-02-08 LAB — CBC WITH DIFFERENTIAL (CANCER CENTER ONLY)
Abs Immature Granulocytes: 0.05 10*3/uL (ref 0.00–0.07)
Basophils Absolute: 0 10*3/uL (ref 0.0–0.1)
Basophils Relative: 0 %
Eosinophils Absolute: 0.1 10*3/uL (ref 0.0–0.5)
Eosinophils Relative: 1 %
HCT: 27.4 % — ABNORMAL LOW (ref 39.0–52.0)
Hemoglobin: 8.4 g/dL — ABNORMAL LOW (ref 13.0–17.0)
Immature Granulocytes: 0 %
Lymphocytes Relative: 11 %
Lymphs Abs: 1.4 10*3/uL (ref 0.7–4.0)
MCH: 26.8 pg (ref 26.0–34.0)
MCHC: 30.7 g/dL (ref 30.0–36.0)
MCV: 87.5 fL (ref 80.0–100.0)
Monocytes Absolute: 1.1 10*3/uL — ABNORMAL HIGH (ref 0.1–1.0)
Monocytes Relative: 9 %
Neutro Abs: 10 10*3/uL — ABNORMAL HIGH (ref 1.7–7.7)
Neutrophils Relative %: 79 %
Platelet Count: 429 10*3/uL — ABNORMAL HIGH (ref 150–400)
RBC: 3.13 MIL/uL — ABNORMAL LOW (ref 4.22–5.81)
RDW: 15.6 % — ABNORMAL HIGH (ref 11.5–15.5)
WBC Count: 12.6 10*3/uL — ABNORMAL HIGH (ref 4.0–10.5)
nRBC: 0 % (ref 0.0–0.2)
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
## 2020-02-08 NOTE — Progress Notes (Signed)
Per Dr. Julien Nordmann, I notified pathology to send PDL 1 on recent pathology

## 2020-02-08 NOTE — Progress Notes (Signed)
START ON PATHWAY REGIMEN - Non-Small Cell Lung     A cycle is every 21 days:     Pembrolizumab      Paclitaxel      Carboplatin   **Always confirm dose/schedule in your pharmacy ordering system**  Patient Characteristics: Stage IV Metastatic, Squamous, PS = 0, 1, First Line, PD-L1 Expression Positive 1-49% (TPS) / Negative / Not Tested / Awaiting Test Results and Immunotherapy Candidate Therapeutic Status: Stage IV Metastatic Histology: Squamous Cell Line of therapy: First Line ECOG Performance Status: 1 PD-L1 Expression Status: Awaiting Test Results Immunotherapy Candidate Status: Candidate for Immunotherapy Intent of Therapy: Non-Curative / Palliative Intent, Discussed with Patient

## 2020-02-09 ENCOUNTER — Telehealth: Payer: Self-pay | Admitting: *Deleted

## 2020-02-09 ENCOUNTER — Telehealth: Payer: Self-pay

## 2020-02-09 NOTE — Telephone Encounter (Signed)
Pts wife and daughter, Lenna Sciara (Missy), called to advised pt does not wish to pursue any treatment (chemo/radiation) but he does want to have the brain MRI. They expressed pt wishes to receive palliative care.  Pt wife/daughter were advised they would need to request a palliative care referral from pts Primary Care Provider. They expressed understanding of this information.  Rad/Onc appts have now been cancelled.

## 2020-02-09 NOTE — Telephone Encounter (Signed)
I received a message that patient does not want any treatment.  He does not want to see rad onc either. Patient rad onc appt cancelled. Patient's wife explained that he does not want treatment.  I listened as she explained. I told her I will update Dr. Julien Nordmann and that if he changes his mind to give [place] a call.  She verbalized understanding. I also spoke to her about Hospice and Palliative care.  Patient's wife contacted PCP for this referral.

## 2020-02-10 ENCOUNTER — Telehealth: Payer: Self-pay | Admitting: *Deleted

## 2020-02-10 ENCOUNTER — Ambulatory Visit: Payer: Medicare Other

## 2020-02-10 ENCOUNTER — Ambulatory Visit: Payer: Medicare Other | Admitting: Radiation Oncology

## 2020-02-10 NOTE — Telephone Encounter (Signed)
I received a message from Ms. Hockett and I called her back. She states patient would like to see Rad Onc doctor.  I will update rad onc scheduling to reach out to them to schedule.

## 2020-02-11 ENCOUNTER — Encounter: Payer: Self-pay | Admitting: Gastroenterology

## 2020-02-11 ENCOUNTER — Ambulatory Visit (INDEPENDENT_AMBULATORY_CARE_PROVIDER_SITE_OTHER): Payer: Medicare Other | Admitting: Gastroenterology

## 2020-02-11 VITALS — BP 110/70 | HR 117 | Ht 69.5 in | Wt 169.0 lb

## 2020-02-11 DIAGNOSIS — K2970 Gastritis, unspecified, without bleeding: Secondary | ICD-10-CM

## 2020-02-11 DIAGNOSIS — K5900 Constipation, unspecified: Secondary | ICD-10-CM

## 2020-02-11 DIAGNOSIS — C3490 Malignant neoplasm of unspecified part of unspecified bronchus or lung: Secondary | ICD-10-CM

## 2020-02-11 DIAGNOSIS — R1011 Right upper quadrant pain: Secondary | ICD-10-CM

## 2020-02-11 DIAGNOSIS — R194 Change in bowel habit: Secondary | ICD-10-CM | POA: Diagnosis not present

## 2020-02-11 DIAGNOSIS — R1010 Upper abdominal pain, unspecified: Secondary | ICD-10-CM

## 2020-02-11 MED ORDER — URSODIOL 300 MG PO CAPS: 300.0000 mg | ORAL_CAPSULE | Freq: Two times a day (BID) | ORAL | 0 refills | Status: AC

## 2020-02-11 NOTE — Progress Notes (Signed)
P  Chief Complaint:    Abdominal pain, hospital follow-up  GI History: Cody Moss is a 79 y.o. male  with history of hypertension, dyslipidemia, diabetes mellitus, coronary artery disease, acute kidney insufficiency, peripheral vascular disease, AAA repair, history of prostate cancer in 2004 as well as long history for smoking who was admitted to the hospital late July with abdominal pain, N/V and AKI. Found to be COVID positive. He had elevated LFTs, workup remarkable for choledocholithiasis. Found to have sepsis from E.coli bacteremia / ? Cholangitis. On 7/28 he underwent ERCP with stone extraction and sphincterotomy. LFTs normalized. He was subsequently seen by General Surgery. Plan was for outpatient follow up to discuss cholecystectomy. During that admission he was treated with steroids and Remdesivir. Found to have a cavitary lung lesion felt to be bacterial rather than COVID related. Treated with antibiotics with outpatient Pulmonary follow up. Discharged home on 7/30.  Readmitted 8/13 with lower abdominal pain, dark stools, productive cough and fever with hypoxia requiring 10 mL oxygen.  CT with interval progression of multifactorial groundglass pulmonary infiltrates and peripheral.  His consolidation; no acute abdominal findings. Hgb 8.5 from 10.5 on hospital discharge on 11/20/2019.  EGD 8/16 with Candida esophagitis gastritis (path: Gastric intestinal metaplasia), gastric ulcers x2, 2 cm HH, Schatzki's ring, duodenal and gastric AVM treated with APC.  Active oozing at prior sphincterotomy site treated with epinephrine and gold probe cautery followed by clips x2.  Since hospital discharge, PET scan 01/18/2020 with cavitary lung mass with FDG uptake of 8.18 and FDG avid right hilar adenopathy, subcarinal lymph node, multifocal liver metastases, bone metastases (left iliac, right clavicle, T6).  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  Tentatively planning to start palliative radiation.  HPI:     Patient is a 79 y.o. male presenting to the Gastroenterology Clinic for follow-up.  Recent hospitalizations in July and August as outlined above, and has been subsequently diagnosed with metastatic non-small cell lung cancer.  Today, his main issue is ongoing intemittent RUQ pain and left abdominal/flank pain.  RUQ pain has been present for a number of months essentially unchanged in quality or frequency.  Previously attributed to known cholelithiasis complicated by choledocholithiasis requiring ERCP in 10/2019.  Not currently a surgical candidate due to other active medical issues as outlined above.  There is left-sided and upper abdominal pain are separate.  After further questioning, sounds like these have been present for a number of years, occurring intermittently, and seemingly associated with changes in bowel habits more recently.  Bowel habits have been normal prior to hospitalizations, but recently with periods of not having BM for 3-4 days, but then will have loose/liquid stools 4-5 times on a single day.  Change in bowel habits only since hospital discharge.  Will have worsening of upper abdominal pain with those episodes.  The left-sided pain seems to be completely independent and has been present for many years.  Recently started fentanyl 25 mcg patch about 9 days ago.  Takes Percocet prn for years for back pain following surgery, but dose was increased from 5/325 to 10/325 more recently (just yesterday) to take for breakthrough pain that isnt controlled by fentanyl.  However, has not had GI issues with these pain medications in the past.   He presents with his wife in clinic.  Review of systems:     No chest pain, no SOB, no  fevers, no urinary sx   Past Medical History:  Diagnosis Date  . AKI (acute kidney injury) ([facility]) 07/2015  . Cataract immature   . Coronary artery  disease CARDIOLOGIST - DR LITTLE - LAST VISIT 05-30-2011-- WILL REQUEST NOTE, ECHO AND STRESS TEST   DENIES S & S  . Diabetes mellitus    "prediabetic", no meds  . Hx of radiation therapy 09/13/03 - 11/05/03   pelvis/prostate bed, Dr Cristela Felt  . Hyperlipidemia   . Hypertension   . Lung cancer ([facility])    dx 2021  . Nocturia   . Peripheral vascular disease ([facility]) S/P AAA AND AORTOBI-ILIAC BYPASS  . Phimosis   . Prostate cancer ([facility]) 05/25/2002   prostatectomy  . S/P AAA repair 2002  . S/P angioplasty with stent     Patient's surgical history, family medical history, social history, medications and allergies were all reviewed in Epic    Current Outpatient Medications  Medication Sig Dispense Refill  . allopurinol (ZYLOPRIM) 300 MG tablet Take 300 mg by mouth daily.    Marland Kitchen amLODipine (NORVASC) 5 MG tablet Take 1 tablet (5 mg total) by mouth daily. 90 tablet 3  . aspirin 325 MG EC tablet Take 325 mg by mouth daily.    . Blood Glucose Monitoring Suppl (ONE TOUCH ULTRA 2) w/Device KIT SMARTSIG:Via Meter    . CANNABIDIOL PO Take by mouth.    . colchicine 0.6 MG tablet Take 1 tablet (0.6 mg total) by mouth daily. (Patient taking differently: Take 0.6 mg by mouth daily as needed (gout attacks). ) 30 tablet 0  . fenofibrate 160 MG tablet Take 160 mg by mouth every evening.    . fentaNYL (DURAGESIC) 25 MCG/HR 1 patch every 3 (three) days.    . finasteride (PROSCAR) 5 MG tablet Take 5 mg by mouth at bedtime.     . metFORMIN (GLUCOPHAGE) 1000 MG tablet Take 0.5 tablets (500 mg total) by mouth 2 (two) times daily with a meal. 30 tablet 0  . oxyCODONE-acetaminophen (PERCOCET) 10-325 MG tablet Take 1 tablet by mouth every 4 (four) hours as needed for pain.    . pantoprazole (PROTONIX) 40 MG tablet Take 1 tablet (40 mg total) by mouth 2 (two) times daily before a meal. Twice daily dosing for 2 months, followed by daily dosing. 120 tablet 1  . REPATHA SURECLICK 562 MG/ML SOAJ INJECT 1 DOSE INTO THE SKIN  EVERY 14 DAYS (Patient not taking: Reported on 02/08/2020) 2 mL 11  . triamcinolone cream (KENALOG) 0.1 % Apply 1 application topically 2 (two) times daily as needed (flare).      No current facility-administered medications for this visit.    Physical Exam:     BP 110/70   Pulse (!) 117   Ht 5' 9.5" (1.765 m)   Wt 169 lb (76.7 kg)   BMI 24.60 kg/m   GENERAL:  Pleasant male in NAD PSYCH: : Cooperative, normal affect EENT:  conjunctiva pink, mucous membranes moist, neck supple without masses CARDIAC:  RRR, no murmur heard, no peripheral edema PULM: Normal respiratory effort, lungs CTA bilaterally, no wheezing ABDOMEN: Large midline incisions.  Nondistended, soft.  Very minimal TTP in RUQ without rebound, guarding, peritoneal signs.  No obvious masses, no hepatomegaly,  normal bowel sounds SKIN:  turgor, no lesions seen Musculoskeletal:  Normal muscle tone, normal strength NEURO: Alert and oriented x 3, no focal neurologic deficits   IMPRESSION and PLAN:    1) RUQ pain: -Pain could certainly be  due to known cholelithiasis.  Not currently a surgical candidate given metastatic cancer. -Trial course of ursodiol for stone dissolution -Liver enzymes otherwise unremarkable on 02/08/2020  2) Upper abdominal pain 3) LLQ pain 4) Change in bowel habits -Pain could certainly be multifactorial, potentially related to change in bowel habits (suspect constipation with overflow diarrhea) as well as known gastritis/PUD on recent EGD.  Additionally, the pain symptoms seem to have been there for a number of years now.  Finally, he has been on Percocet for a number of years, but change in bowel habits only more recent.  Plan for the following: -Trial course of MiraLAX for treatment of suspected constipation with overflow diarrhea.  Discussed starting bid and titrating down -Continue drinking plenty of fluids -Start fiber supplement -Okay to continue Gas-X as needed and probiotic -Depending on  response to treatment, briefly discussed trial of methylnaltrexone but again has had Percocet for a number of years before the more recent symptoms -Did discuss potential overlap for his lytic bone lesions and hepatic lesions  5) Gastritis/Peptic ulcer disease -Continue Protonix bid for another 4 weeks, then can reduce to 40 mg/day  6) Metastatic non-small cell lung cancer -Following in the Oncology clinic -Continue pain medication through Oncology service  RTC in 2 months or sooner as needed  I spent 35 minutes of time, including in depth chart review, independent review of results as outlined above, communicating results with the patient directly, face-to-face time with the patient, coordinating care, and ordering studies and medications as appropriate, and documentation.       Lavena Bullion ,DO, FACG 02/11/2020, 10:58 AM

## 2020-02-11 NOTE — Patient Instructions (Signed)
If you are age 77 or older, your body mass index should be between 23-30. Your Body mass index is 24.6 kg/m. If this is out of the aforementioned range listed, please consider follow up with your Primary Care Provider.  If you are age 47 or younger, your body mass index should be between 19-25. Your Body mass index is 24.6 kg/m. If this is out of the aformentioned range listed, please consider follow up with your Primary Care Provider.   We have sent the following medications to your pharmacy for you to pick up at your convenience:  Ursodial 600mg  daily.  Take Miralax twice daily for 7 days, then titrate to as needed.  Use a fiber supplement.  Due to recent changes in healthcare laws, you may see the results of your imaging and laboratory studies on MyChart before your provider has had a chance to review them.  We understand that in some cases there may be results that are confusing or concerning to you. Not all laboratory results come back in the same time frame and the provider may be waiting for multiple results in order to interpret others.  Please give [place] 48 hours in order for your provider to thoroughly review all the results before contacting the office for clarification of your results.

## 2020-02-16 ENCOUNTER — Ambulatory Visit (HOSPITAL_COMMUNITY): Payer: Medicare Other

## 2020-02-16 ENCOUNTER — Ambulatory Visit (HOSPITAL_COMMUNITY): Admission: RE | Admit: 2020-02-16 | Payer: Medicare Other | Source: Ambulatory Visit

## 2020-02-23 ENCOUNTER — Encounter: Payer: Self-pay | Admitting: *Deleted

## 2020-02-23 NOTE — Progress Notes (Signed)
I received a call today from Dr. Raul Del office stating that patient would like to be seen with rad onc options.  I notified rad onc scheduling team to reach out to patient to schedule.

## 2020-02-26 ENCOUNTER — Telehealth: Payer: Self-pay | Admitting: *Deleted

## 2020-02-26 ENCOUNTER — Telehealth: Payer: Self-pay | Admitting: Internal Medicine

## 2020-02-26 NOTE — Telephone Encounter (Signed)
I received a vm message from Ms. Burgueno today.  She has questions about his plan of care and if only doing immuno therapy is an options.  I listened as she explained.  I discussed what Dr. Julien Nordmann plan but she would like to speak to him again and ask about just having immuno therapy.  I updated that a follow up appt with Dr. Julien Nordmann would be best to speak to him.  I will update scheduling to call patient with a follow up appt.  She verbalized understanding.

## 2020-02-26 NOTE — Telephone Encounter (Signed)
Scheduled appt per 11/5 sch msg - pt is aware of appt date and time

## 2020-02-29 NOTE — Progress Notes (Signed)
Histology and Location of Primary Cancer: Stage IV Lung Cancer- RLL, NSCLC.  Mets to Liver, Posterior left iliac bone, Head of right clavicle, T6 vertebrae  Location(s) of Symptomatic Metastases: Left iliac bone  Past/Anticipated chemotherapy by medical oncology, if any:  Dr. Julien Nordmann 02/08/2020 -I gave him the option of palliative care and hospice referral versus consideration of palliative systemic chemotherapy with carboplatin for AUC of 5, paclitaxel 175 mg/M2 and Keytruda 200 mg IV every 3 weeks with Neulasta support for 4 cycles followed by maintenance treatment with Keytruda if the patient has no evidence for disease progression. -The patient would like to take some time to think about this option and discuss it with the rest of the family. -I may also refer the patient to radiation oncology for discussion of palliative radiotherapy to the painful bone lesions. 02/09/2020 -Patient decided not to pursue any treatment at this time. 02/23/2020 -Patient would like to be seen by RT. 02/26/2020 -Patient would like to discuss with Dr. Julien Nordmann more regarding immunotherapy.   Pain on a scale of 0-10 is: Left Hip across to his upper stomach.  Taking oxycodone.  Recently completed fentanyl patch- removed yesterday.    Ambulatory status? Walker? Wheelchair?: Uses walker  SAFETY ISSUES:  Prior radiation? Prostate 2005 with Dr. Valere Dross  Pacemaker/ICD? no  Possible current pregnancy? n/a  Is the patient on methotrexate? no  Current Complaints / other details:

## 2020-03-01 ENCOUNTER — Ambulatory Visit: Payer: Medicare Other | Admitting: Radiation Oncology

## 2020-03-01 ENCOUNTER — Ambulatory Visit
Admission: RE | Admit: 2020-03-01 | Discharge: 2020-03-01 | Disposition: A | Discharge status: Home / Self Care | Payer: Medicare Other | Source: Ambulatory Visit | Attending: Radiation Oncology | Admitting: Radiation Oncology

## 2020-03-01 ENCOUNTER — Other Ambulatory Visit: Payer: Self-pay

## 2020-03-01 ENCOUNTER — Encounter: Payer: Self-pay | Admitting: Radiation Oncology

## 2020-03-01 VITALS — BP 120/70 | HR 99 | Temp 98.6°F | Resp 24

## 2020-03-01 DIAGNOSIS — E119 Type 2 diabetes mellitus without complications: Secondary | ICD-10-CM | POA: Diagnosis not present

## 2020-03-01 DIAGNOSIS — I10 Essential (primary) hypertension: Secondary | ICD-10-CM | POA: Insufficient documentation

## 2020-03-01 DIAGNOSIS — C787 Secondary malignant neoplasm of liver and intrahepatic bile duct: Secondary | ICD-10-CM | POA: Diagnosis not present

## 2020-03-01 DIAGNOSIS — C3431 Malignant neoplasm of lower lobe, right bronchus or lung: Secondary | ICD-10-CM | POA: Insufficient documentation

## 2020-03-01 DIAGNOSIS — C7951 Secondary malignant neoplasm of bone: Secondary | ICD-10-CM | POA: Insufficient documentation

## 2020-03-01 DIAGNOSIS — Z7982 Long term (current) use of aspirin: Secondary | ICD-10-CM | POA: Insufficient documentation

## 2020-03-01 DIAGNOSIS — Z79899 Other long term (current) drug therapy: Secondary | ICD-10-CM | POA: Diagnosis not present

## 2020-03-01 DIAGNOSIS — Z7984 Long term (current) use of oral hypoglycemic drugs: Secondary | ICD-10-CM | POA: Insufficient documentation

## 2020-03-01 DIAGNOSIS — Z87891 Personal history of nicotine dependence: Secondary | ICD-10-CM | POA: Insufficient documentation

## 2020-03-01 DIAGNOSIS — G893 Neoplasm related pain (acute) (chronic): Secondary | ICD-10-CM | POA: Diagnosis not present

## 2020-03-01 DIAGNOSIS — I739 Peripheral vascular disease, unspecified: Secondary | ICD-10-CM | POA: Insufficient documentation

## 2020-03-01 DIAGNOSIS — E785 Hyperlipidemia, unspecified: Secondary | ICD-10-CM | POA: Diagnosis not present

## 2020-03-01 DIAGNOSIS — I2510 Atherosclerotic heart disease of native coronary artery without angina pectoris: Secondary | ICD-10-CM | POA: Insufficient documentation

## 2020-03-01 DIAGNOSIS — C3491 Malignant neoplasm of unspecified part of right bronchus or lung: Secondary | ICD-10-CM

## 2020-03-01 MED ORDER — MORPHINE SULFATE ER 15 MG PO TBCR: 15.0000 mg | EXTENDED_RELEASE_TABLET | Freq: Two times a day (BID) | ORAL | 0 refills | Status: DC
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
## 2020-03-02 ENCOUNTER — Telehealth: Payer: Self-pay | Admitting: Gastroenterology

## 2020-03-02 ENCOUNTER — Telehealth: Payer: Self-pay | Admitting: *Deleted

## 2020-03-02 NOTE — Telephone Encounter (Signed)
I received a call from Cody Moss's wife today.  She had many questions and had her daughter get on the phone.  I clarified questions and concerns.  Cody Moss is going to be seen with Dr. Julien Nordmann tomorrow and I stated that he could help the family more understand tx and prognosis.  They were thankful for the help and guidance.

## 2020-03-03 ENCOUNTER — Ambulatory Visit
Admission: RE | Admit: 2020-03-03 | Discharge: 2020-03-03 | Disposition: A | Discharge status: Home / Self Care | Source: Ambulatory Visit | Attending: Radiation Oncology | Admitting: Radiation Oncology

## 2020-03-03 ENCOUNTER — Inpatient Hospital Stay: Payer: Medicare Other | Attending: Internal Medicine | Admitting: Internal Medicine

## 2020-03-03 ENCOUNTER — Inpatient Hospital Stay: Payer: Medicare Other

## 2020-03-03 ENCOUNTER — Other Ambulatory Visit: Payer: Self-pay

## 2020-03-03 VITALS — BP 138/71 | HR 93 | Temp 97.0°F | Resp 18 | Ht 69.5 in | Wt 148.3 lb

## 2020-03-03 DIAGNOSIS — C3491 Malignant neoplasm of unspecified part of right bronchus or lung: Secondary | ICD-10-CM | POA: Insufficient documentation

## 2020-03-03 DIAGNOSIS — R531 Weakness: Secondary | ICD-10-CM | POA: Insufficient documentation

## 2020-03-03 DIAGNOSIS — I10 Essential (primary) hypertension: Secondary | ICD-10-CM

## 2020-03-03 DIAGNOSIS — Z87891 Personal history of nicotine dependence: Secondary | ICD-10-CM | POA: Diagnosis not present

## 2020-03-03 DIAGNOSIS — Z7189 Other specified counseling: Secondary | ICD-10-CM | POA: Diagnosis not present

## 2020-03-03 DIAGNOSIS — C7951 Secondary malignant neoplasm of bone: Secondary | ICD-10-CM | POA: Insufficient documentation

## 2020-03-03 DIAGNOSIS — Z5112 Encounter for antineoplastic immunotherapy: Secondary | ICD-10-CM

## 2020-03-03 DIAGNOSIS — C3431 Malignant neoplasm of lower lobe, right bronchus or lung: Secondary | ICD-10-CM | POA: Insufficient documentation

## 2020-03-03 DIAGNOSIS — R0602 Shortness of breath: Secondary | ICD-10-CM | POA: Insufficient documentation

## 2020-03-03 DIAGNOSIS — Z8546 Personal history of malignant neoplasm of prostate: Secondary | ICD-10-CM | POA: Insufficient documentation

## 2020-03-03 DIAGNOSIS — R634 Abnormal weight loss: Secondary | ICD-10-CM | POA: Diagnosis not present

## 2020-03-03 DIAGNOSIS — Z9221 Personal history of antineoplastic chemotherapy: Secondary | ICD-10-CM | POA: Insufficient documentation

## 2020-03-03 DIAGNOSIS — Z5111 Encounter for antineoplastic chemotherapy: Secondary | ICD-10-CM | POA: Diagnosis not present

## 2020-03-03 DIAGNOSIS — Z510 Encounter for antineoplastic radiation therapy: Secondary | ICD-10-CM | POA: Insufficient documentation

## 2020-03-03 DIAGNOSIS — C787 Secondary malignant neoplasm of liver and intrahepatic bile duct: Secondary | ICD-10-CM | POA: Insufficient documentation

## 2020-03-03 LAB — CBC WITH DIFFERENTIAL (CANCER CENTER ONLY)
Abs Immature Granulocytes: 0.06 10*3/uL (ref 0.00–0.07)
Basophils Absolute: 0 10*3/uL (ref 0.0–0.1)
Basophils Relative: 0 %
Eosinophils Absolute: 0 10*3/uL (ref 0.0–0.5)
Eosinophils Relative: 0 %
HCT: 28.3 % — ABNORMAL LOW (ref 39.0–52.0)
Hemoglobin: 8.2 g/dL — ABNORMAL LOW (ref 13.0–17.0)
Immature Granulocytes: 0 %
Lymphocytes Relative: 7 %
Lymphs Abs: 0.9 10*3/uL (ref 0.7–4.0)
MCH: 24.7 pg — ABNORMAL LOW (ref 26.0–34.0)
MCHC: 29 g/dL — ABNORMAL LOW (ref 30.0–36.0)
MCV: 85.2 fL (ref 80.0–100.0)
Monocytes Absolute: 1 10*3/uL (ref 0.1–1.0)
Monocytes Relative: 7 %
Neutro Abs: 11.9 10*3/uL — ABNORMAL HIGH (ref 1.7–7.7)
Neutrophils Relative %: 86 %
Platelet Count: 437 10*3/uL — ABNORMAL HIGH (ref 150–400)
RBC: 3.32 MIL/uL — ABNORMAL LOW (ref 4.22–5.81)
RDW: 16.6 % — ABNORMAL HIGH (ref 11.5–15.5)
WBC Count: 13.9 10*3/uL — ABNORMAL HIGH (ref 4.0–10.5)
nRBC: 0 % (ref 0.0–0.2)

## 2020-03-03 LAB — CMP (CANCER CENTER ONLY)
ALT: <6 U/L (ref 0–44)
AST: 17 U/L (ref 15–41)
Albumin: 2.4 g/dL — ABNORMAL LOW (ref 3.5–5.0)
Alkaline Phosphatase: 70 U/L (ref 38–126)
Anion gap: 10 (ref 5–15)
BUN: 26 mg/dL — ABNORMAL HIGH (ref 8–23)
CO2: 33 mmol/L — ABNORMAL HIGH (ref 22–32)
Calcium: 12.9 mg/dL — ABNORMAL HIGH (ref 8.9–10.3)
Chloride: 94 mmol/L — ABNORMAL LOW (ref 98–111)
Creatinine: 0.94 mg/dL (ref 0.61–1.24)
GFR, Estimated: >60 mL/min (ref >60)
Glucose, Bld: 118 mg/dL — ABNORMAL HIGH (ref 70–99)
Potassium: 4.2 mmol/L (ref 3.5–5.1)
Sodium: 137 mmol/L (ref 135–145)
Total Bilirubin: 0.5 mg/dL (ref 0.3–1.2)
Total Protein: 7.1 g/dL (ref 6.5–8.1)

## 2020-03-03 LAB — TSH: TSH: 0.484 u[IU]/mL (ref 0.320–4.118)

## 2020-03-03 NOTE — Telephone Encounter (Signed)
Left a voicemail for patient to contact the clinic regarding his Ursodial.
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
## 2020-03-04 ENCOUNTER — Encounter: Payer: Self-pay | Admitting: Internal Medicine

## 2020-03-05 ENCOUNTER — Other Ambulatory Visit: Payer: Self-pay | Admitting: Oncology

## 2020-03-05 DIAGNOSIS — Z510 Encounter for antineoplastic radiation therapy: Secondary | ICD-10-CM | POA: Diagnosis not present

## 2020-03-05 MED ORDER — MORPHINE SULFATE (CONCENTRATE) 20 MG/ML PO SOLN
20.0000 mg | ORAL | 0 refills | Status: AC | PRN
Start: 2020-03-05 — End: ?

## 2020-03-07 ENCOUNTER — Ambulatory Visit: Admitting: Radiation Oncology

## 2020-03-07 ENCOUNTER — Telehealth: Payer: Self-pay | Admitting: Radiation Oncology

## 2020-03-07 NOTE — Telephone Encounter (Signed)
Cody Moss, daughter of patient, called just now 8:48a 11/15, to let us know that pt is not feeling well and will not be able to make his treatment for radiation today. I asked her if she would like to reschedule. She said she will need to call us back to r/s. I am letting L3, Dr. Lisbeth Renshaw, Shona Simpson, PA, and Blenda Nicely, RN, know.

## 2020-03-07 NOTE — Telephone Encounter (Signed)
I received a message from the treatment staff stating that the patient had canceled his treatment today and wished to discontinue all further treatment.  I was unable to reach him and left a message at home, I was also unsuccessful at reaching his daughters.  We will follow-up with her decision making and asked that they call us back to clarify their goals.

## 2020-03-08 ENCOUNTER — Telehealth: Payer: Self-pay | Admitting: Internal Medicine

## 2020-03-08 ENCOUNTER — Ambulatory Visit

## 2020-03-08 ENCOUNTER — Telehealth: Payer: Self-pay | Admitting: Gastroenterology

## 2020-03-08 ENCOUNTER — Telehealth: Payer: Self-pay | Admitting: Radiation Oncology

## 2020-03-08 NOTE — Telephone Encounter (Signed)
The patient passed away per treatment staff that they had learned per his wife. I called her back and he passed away [place]. Confirmed by [employer]. Status updated, and condolences were given to his wife.    Carola Rhine, PAC

## 2020-03-08 NOTE — Telephone Encounter (Signed)
LM for daughter Lenna Sciara [facility]) to call me back about appts.

## 2020-03-08 NOTE — Telephone Encounter (Signed)
Pt's wife called to inform that pt passed away last July 29, 2022. I see that pt's chart has been updated accordingly. However, pt's wife wanted Dr. Bryan Lemma to be aware of that.

## 2020-03-08 NOTE — Telephone Encounter (Signed)
I appreciate the phone call and update.  Please convey my condolences to Mrs. Mikaelian.

## 2020-03-08 NOTE — Telephone Encounter (Signed)
The wife has been notified

## 2020-03-08 NOTE — Telephone Encounter (Signed)
Deceased patient's spouse would like for either Dr. Debara Pickett or Eliezer Lofts or give her a phone call. Please call.

## 2020-03-08 NOTE — Telephone Encounter (Signed)
Forward message to Dr Bryan Lemma

## 2020-03-09 ENCOUNTER — Ambulatory Visit

## 2020-03-09 NOTE — Telephone Encounter (Signed)
Spoke with Mrs. Ishee about husband's passing. He was home with his wife and daughter who is an LPN. She is thankful that he is no longer suffering. Expressed our deepest condolences of Aodhan's passing.

## 2020-03-10 ENCOUNTER — Ambulatory Visit

## 2020-03-11 ENCOUNTER — Ambulatory Visit

## 2020-03-11 ENCOUNTER — Telehealth: Payer: Self-pay | Admitting: Medical Oncology

## 2020-03-11 NOTE — Telephone Encounter (Signed)
Called to express her gratitude for the care [place] received . She will call Hinton Dyer next week.

## 2020-03-13 ENCOUNTER — Ambulatory Visit

## 2020-03-13 NOTE — Telephone Encounter (Signed)
Thank you :)

## 2020-03-14 ENCOUNTER — Ambulatory Visit

## 2020-03-15 ENCOUNTER — Ambulatory Visit

## 2020-03-16 ENCOUNTER — Ambulatory Visit

## 2020-03-21 ENCOUNTER — Ambulatory Visit

## 2020-03-22 ENCOUNTER — Ambulatory Visit

## 2020-03-23 DEATH — deceased

## 2020-04-12 ENCOUNTER — Ambulatory Visit: Payer: Medicare Other | Admitting: Gastroenterology

## 2020-05-10 ENCOUNTER — Ambulatory Visit: Payer: Medicare Other | Admitting: Internal Medicine
